# Patient Record
Sex: Male | Born: 1945 | Race: White | Hispanic: No | Marital: Married | Smoking: Former smoker
Health system: Southern US, Community
[De-identification: ages and names within clinical notes are randomized; demographics above are authoritative.]

## PROBLEM LIST (undated history)

## (undated) DIAGNOSIS — I340 Nonrheumatic mitral (valve) insufficiency: Secondary | ICD-10-CM

## (undated) DIAGNOSIS — I422 Other hypertrophic cardiomyopathy: Secondary | ICD-10-CM

## (undated) DIAGNOSIS — H269 Unspecified cataract: Secondary | ICD-10-CM

## (undated) DIAGNOSIS — N529 Male erectile dysfunction, unspecified: Secondary | ICD-10-CM

## (undated) DIAGNOSIS — I472 Ventricular tachycardia: Secondary | ICD-10-CM

## (undated) DIAGNOSIS — IMO0001 Reserved for inherently not codable concepts without codable children: Secondary | ICD-10-CM

## (undated) DIAGNOSIS — K219 Gastro-esophageal reflux disease without esophagitis: Secondary | ICD-10-CM

## (undated) DIAGNOSIS — Q219 Congenital malformation of cardiac septum, unspecified: Secondary | ICD-10-CM

## (undated) DIAGNOSIS — M8580 Other specified disorders of bone density and structure, unspecified site: Secondary | ICD-10-CM

## (undated) DIAGNOSIS — K649 Unspecified hemorrhoids: Secondary | ICD-10-CM

## (undated) DIAGNOSIS — T8859XA Other complications of anesthesia, initial encounter: Secondary | ICD-10-CM

## (undated) DIAGNOSIS — D126 Benign neoplasm of colon, unspecified: Secondary | ICD-10-CM

## (undated) DIAGNOSIS — K810 Acute cholecystitis: Secondary | ICD-10-CM

## (undated) DIAGNOSIS — K449 Diaphragmatic hernia without obstruction or gangrene: Secondary | ICD-10-CM

## (undated) DIAGNOSIS — T7840XA Allergy, unspecified, initial encounter: Secondary | ICD-10-CM

## (undated) DIAGNOSIS — R0600 Dyspnea, unspecified: Secondary | ICD-10-CM

## (undated) DIAGNOSIS — T4145XA Adverse effect of unspecified anesthetic, initial encounter: Secondary | ICD-10-CM

## (undated) HISTORY — DX: Nonrheumatic mitral (valve) insufficiency: I34.0

## (undated) HISTORY — DX: Benign neoplasm of colon, unspecified: D12.6

## (undated) HISTORY — DX: Unspecified hemorrhoids: K64.9

## (undated) HISTORY — DX: Gastro-esophageal reflux disease without esophagitis: K21.9

## (undated) HISTORY — DX: Unspecified cataract: H26.9

## (undated) HISTORY — DX: Diaphragmatic hernia without obstruction or gangrene: K44.9

## (undated) HISTORY — PX: CATARACT EXTRACTION, BILATERAL: SHX1313

## (undated) HISTORY — DX: Allergy, unspecified, initial encounter: T78.40XA

## (undated) HISTORY — DX: Congenital malformation of cardiac septum, unspecified: Q21.9

## (undated) HISTORY — DX: Acute cholecystitis: K81.0

## (undated) HISTORY — DX: Other hypertrophic cardiomyopathy: I42.2

## (undated) HISTORY — DX: Reserved for inherently not codable concepts without codable children: IMO0001

## (undated) HISTORY — DX: Paroxysmal atrial fibrillation: I48.0

## (undated) HISTORY — DX: Male erectile dysfunction, unspecified: N52.9

## (undated) HISTORY — DX: Other specified disorders of bone density and structure, unspecified site: M85.80

## (undated) HISTORY — PX: COLONOSCOPY: SHX174

---
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
## 2011-06-21 ENCOUNTER — Inpatient Hospital Stay (HOSPITAL_COMMUNITY): Payer: Medicare Other

## 2011-06-21 ENCOUNTER — Inpatient Hospital Stay (HOSPITAL_COMMUNITY)
Admission: EM | Admit: 2011-06-21 | Discharge: 2011-06-24 | DRG: 418 | Disposition: A | Discharge status: Home / Self Care | Payer: Medicare Other | Attending: Internal Medicine | Admitting: Internal Medicine

## 2011-06-21 ENCOUNTER — Emergency Department (HOSPITAL_COMMUNITY): Payer: Medicare Other

## 2011-06-21 DIAGNOSIS — K8020 Calculus of gallbladder without cholecystitis without obstruction: Secondary | ICD-10-CM

## 2011-06-21 DIAGNOSIS — R109 Unspecified abdominal pain: Secondary | ICD-10-CM

## 2011-06-21 DIAGNOSIS — I509 Heart failure, unspecified: Secondary | ICD-10-CM | POA: Diagnosis present

## 2011-06-21 DIAGNOSIS — R1013 Epigastric pain: Secondary | ICD-10-CM

## 2011-06-21 DIAGNOSIS — Z7982 Long term (current) use of aspirin: Secondary | ICD-10-CM

## 2011-06-21 DIAGNOSIS — B351 Tinea unguium: Secondary | ICD-10-CM | POA: Diagnosis present

## 2011-06-21 DIAGNOSIS — K219 Gastro-esophageal reflux disease without esophagitis: Secondary | ICD-10-CM | POA: Diagnosis present

## 2011-06-21 DIAGNOSIS — R0789 Other chest pain: Secondary | ICD-10-CM | POA: Diagnosis present

## 2011-06-21 DIAGNOSIS — K8000 Calculus of gallbladder with acute cholecystitis without obstruction: Principal | ICD-10-CM | POA: Diagnosis present

## 2011-06-21 DIAGNOSIS — K449 Diaphragmatic hernia without obstruction or gangrene: Secondary | ICD-10-CM | POA: Diagnosis present

## 2011-06-21 DIAGNOSIS — I422 Other hypertrophic cardiomyopathy: Secondary | ICD-10-CM | POA: Diagnosis present

## 2011-06-21 DIAGNOSIS — K810 Acute cholecystitis: Secondary | ICD-10-CM

## 2011-06-21 HISTORY — DX: Acute cholecystitis: K81.0

## 2011-06-21 LAB — CBC
MCV: 87.2 fL (ref 78.0–100.0)
Platelets: 179 10*3/uL (ref 150–400)
RBC: 4.53 MIL/uL (ref 4.22–5.81)
WBC: 7.8 10*3/uL (ref 4.0–10.5)

## 2011-06-21 LAB — HEPATIC FUNCTION PANEL
Bilirubin, Direct: 0.1 mg/dL (ref 0.0–0.3)
Indirect Bilirubin: 0.3 mg/dL (ref 0.3–0.9)

## 2011-06-21 LAB — POCT I-STAT, CHEM 8
BUN: 18 mg/dL (ref 6–23)
Chloride: 105 mEq/L (ref 96–112)
Sodium: 141 mEq/L (ref 135–145)

## 2011-06-21 LAB — POCT I-STAT TROPONIN I: Troponin i, poc: 0.01 ng/mL (ref 0.00–0.08)

## 2011-06-21 LAB — CARDIAC PANEL(CRET KIN+CKTOT+MB+TROPI)
CK, MB: 3.1 ng/mL (ref 0.3–4.0)
CK, MB: 2.9 ng/mL (ref 0.3–4.0)
Relative Index: 1.9 (ref 0.0–2.5)
Relative Index: 1.4 (ref 0.0–2.5)
Total CK: 143 U/L (ref 7–232)
Total CK: 152 U/L (ref 7–232)
Total CK: 167 U/L (ref 7–232)
Troponin I: <0.3 ng/mL (ref <0.30)
Troponin I: <0.3 ng/mL (ref <0.30)

## 2011-06-21 LAB — CK TOTAL AND CKMB (NOT AT ARMC)
CK, MB: 3.7 ng/mL (ref 0.3–4.0)
Relative Index: 2.5 (ref 0.0–2.5)
Total CK: 150 U/L (ref 7–232)

## 2011-06-21 LAB — DIFFERENTIAL
Basophils Relative: 0 % (ref 0–1)
Eosinophils Absolute: 0.1 10*3/uL (ref 0.0–0.7)
Lymphs Abs: 1.6 10*3/uL (ref 0.7–4.0)
Neutrophils Relative %: 69 % (ref 43–77)

## 2011-06-21 LAB — LIPASE, BLOOD: Lipase: 35 U/L (ref 11–59)

## 2011-06-22 ENCOUNTER — Inpatient Hospital Stay (HOSPITAL_COMMUNITY): Payer: BLUE CROSS/BLUE SHIELD

## 2011-06-22 ENCOUNTER — Inpatient Hospital Stay (HOSPITAL_COMMUNITY): Payer: Medicare Other

## 2011-06-22 DIAGNOSIS — R740 Nonspecific elevation of levels of transaminase and lactic acid dehydrogenase [LDH]: Secondary | ICD-10-CM

## 2011-06-22 DIAGNOSIS — R7401 Elevation of levels of liver transaminase levels: Secondary | ICD-10-CM

## 2011-06-22 DIAGNOSIS — K8020 Calculus of gallbladder without cholecystitis without obstruction: Secondary | ICD-10-CM

## 2011-06-22 DIAGNOSIS — K831 Obstruction of bile duct: Secondary | ICD-10-CM

## 2011-06-22 DIAGNOSIS — K8050 Calculus of bile duct without cholangitis or cholecystitis without obstruction: Secondary | ICD-10-CM

## 2011-06-22 LAB — COMPREHENSIVE METABOLIC PANEL
Albumin: 3 g/dL — ABNORMAL LOW (ref 3.5–5.2)
BUN: 11 mg/dL (ref 6–23)
Chloride: 104 mEq/L (ref 96–112)
Creatinine, Ser: 1.11 mg/dL (ref 0.50–1.35)
GFR calc Af Amer: 78 mL/min — ABNORMAL LOW (ref >90)
Glucose, Bld: 118 mg/dL — ABNORMAL HIGH (ref 70–99)
Total Bilirubin: 5.2 mg/dL — ABNORMAL HIGH (ref 0.3–1.2)
Total Protein: 5.8 g/dL — ABNORMAL LOW (ref 6.0–8.3)

## 2011-06-22 LAB — CBC
Hemoglobin: 13.3 g/dL (ref 13.0–17.0)
MCH: 30.2 pg (ref 26.0–34.0)
Platelets: 148 10*3/uL — ABNORMAL LOW (ref 150–400)
RBC: 4.4 MIL/uL (ref 4.22–5.81)
WBC: 5 10*3/uL (ref 4.0–10.5)

## 2011-06-22 LAB — MRSA PCR SCREENING: MRSA by PCR: NEGATIVE

## 2011-06-22 LAB — DIFFERENTIAL
Basophils Relative: 0 % (ref 0–1)
Monocytes Relative: 9 % (ref 3–12)
Neutro Abs: 3.4 10*3/uL (ref 1.7–7.7)
Neutrophils Relative %: 68 % (ref 43–77)

## 2011-06-23 ENCOUNTER — Other Ambulatory Visit (INDEPENDENT_AMBULATORY_CARE_PROVIDER_SITE_OTHER): Payer: Self-pay | Admitting: Surgery

## 2011-06-23 ENCOUNTER — Encounter (INDEPENDENT_AMBULATORY_CARE_PROVIDER_SITE_OTHER): Payer: Self-pay | Admitting: Surgery

## 2011-06-23 ENCOUNTER — Inpatient Hospital Stay (HOSPITAL_COMMUNITY): Payer: Medicare Other

## 2011-06-23 DIAGNOSIS — K8010 Calculus of gallbladder with chronic cholecystitis without obstruction: Secondary | ICD-10-CM

## 2011-06-23 LAB — SURGICAL PCR SCREEN
MRSA, PCR: NEGATIVE
Staphylococcus aureus: NEGATIVE

## 2011-06-23 LAB — CBC
HCT: 36.6 % — ABNORMAL LOW (ref 39.0–52.0)
Hemoglobin: 12.2 g/dL — ABNORMAL LOW (ref 13.0–17.0)
RBC: 4.05 MIL/uL — ABNORMAL LOW (ref 4.22–5.81)
WBC: 4.9 10*3/uL (ref 4.0–10.5)

## 2011-06-23 LAB — COMPREHENSIVE METABOLIC PANEL
BUN: 7 mg/dL (ref 6–23)
CO2: 25 mEq/L (ref 19–32)
Calcium: 8.1 mg/dL — ABNORMAL LOW (ref 8.4–10.5)
Chloride: 109 mEq/L (ref 96–112)
Creatinine, Ser: 1.11 mg/dL (ref 0.50–1.35)
GFR calc Af Amer: 78 mL/min — ABNORMAL LOW (ref >90)
GFR calc non Af Amer: 67 mL/min — ABNORMAL LOW (ref >90)
Total Bilirubin: 4.3 mg/dL — ABNORMAL HIGH (ref 0.3–1.2)

## 2011-06-23 LAB — BILIRUBIN, DIRECT: Bilirubin, Direct: 3.6 mg/dL — ABNORMAL HIGH (ref 0.0–0.3)

## 2011-06-24 HISTORY — PX: CHOLECYSTECTOMY: SHX55

## 2011-06-24 LAB — CBC
Platelets: 155 10*3/uL (ref 150–400)
RDW: 13.4 % (ref 11.5–15.5)
WBC: 9 10*3/uL (ref 4.0–10.5)

## 2011-06-24 LAB — COMPREHENSIVE METABOLIC PANEL
ALT: 310 U/L — ABNORMAL HIGH (ref 0–53)
AST: 179 U/L — ABNORMAL HIGH (ref 0–37)
Albumin: 2.8 g/dL — ABNORMAL LOW (ref 3.5–5.2)
Alkaline Phosphatase: 109 U/L (ref 39–117)
Chloride: 105 mEq/L (ref 96–112)
Creatinine, Ser: 1.16 mg/dL (ref 0.50–1.35)
Potassium: 3.8 mEq/L (ref 3.5–5.1)
Sodium: 139 mEq/L (ref 135–145)
Total Bilirubin: 2 mg/dL — ABNORMAL HIGH (ref 0.3–1.2)

## 2011-06-24 NOTE — Op Note (Signed)
Joseph Morrow, Joseph Morrow NO.:  [id number]  MEDICAL RECORD NO.:  [id number]  LOCATION:  3735                         FACILITY:  [facility]  PHYSICIAN:  Currie Paris, M.D.DATE OF BIRTH:  1946-08-03  DATE OF PROCEDURE:  06/23/2011 DATE OF DISCHARGE:                              OPERATIVE REPORT   PREOPERATIVE DIAGNOSIS:  Subacute calculus cholecystitis, status post endoscopic retrograde cholangiopancreatography for choledocholithiasis.  POSTOPERATIVE DIAGNOSIS:  Subacute calculus cholecystitis, status post endoscopic retrograde cholangiopancreatography for choledocholithiasis.  PROCEDURE:  Laparoscopic cholecystectomy with operative cholangiogram.  SURGEON:  Currie Paris, MD.  ASSISTANT:  Brayton El, PA-C.  ANESTHESIA:  General endotracheal.  CLINICAL HISTORY:  This is a 65 year old gentleman who recently presented with abdominal pain, was found to have cholelithiasis.  The morning after admission, his bilirubin jumped to over 5 and GI consultation was obtained.  ERCP was done that day with stone retrieval.  The next morning, he was feeling better.  His bilirubin had started to drift down.  At that point, it was recommended that we proceed as planned with a cholecystectomy.  DESCRIPTION OF PROCEDURE:  I saw the patient again in the holding area and he had no further questions.  He was taken to the operating room and after satisfactory general endotracheal anesthesia had been obtained, the abdomen was clipped, prepped, and draped.  0.25% plain Marcaine was used for each incision.  The umbilical incision was made, the fascia identified and opened and the peritoneal cavity entered under direct vision.  A pursestring was placed, the Hasson introduced, and the abdomen insufflated to 15.  Exam of the abdomen showed a tense distended gallbladder but no other obvious abnormalities.  There were a few right lower quadrant adhesions secondary to an old  appendectomy type scar.  The patient was placed in reverse Trendelenburg and tilted to the left. A 10-11 trocar was placed in the epigastrium and two 5s laterally.  The fundus of the gallbladder was retracted inferolaterally and the peritoneum opened over the cystic duct and cystic artery, and I dissected out both of those with a nice window behind both and between the 2.  The cystic duct was about the diameter of one of the clips but just a little bit smaller than that and the cystic artery appeared normal.  Once I had the anatomy confirmed, I put a clip on the cystic artery and one on the cystic duct.  The cystic duct was then opened.  A Cook catheter was placed percutaneously and threaded into the cystic duct and held with a clip.  Operative angiography was done which showed filling of the common hepatic duct, common duct, and into the duodenum. Unfortunately, the static image was remaining and we did not get the full fluoro images, but we had good films and the patient had a prior ERCP, so I elected not to repeat the films.  The cystic duct catheter was removed and 3 clips placed across the cystic duct and the duct was divided.  Two additional clips were placed on the cystic artery and it was divided.  There was no posterior artery just a one main artery although there was some tissue just posterior to the first artery that  I clipped.  The gallbladder was then removed from below to above with coagulation current of cautery.  Once it was disconnected, it was placed in a bag and I inspected to make sure there is no bleeding along the bed of the gallbladder or around the area of the cystic artery and cystic duct. Everything appeared to be dry.  The gallbladder was brought out through the umbilical port site.  That was occluded for a moment while we did a final irrigation and check for hemostasis.  Everything appeared to be dry.  The lateral ports removed under direct vision and  there is no bleeding.  The umbilical site was closed with a pursestring.  The abdomen was deflated through the epigastric port which was removed.  Skin was closed with 4-0 Monocryl subcuticular and Dermabond.  The patient tolerated the procedure well.  There were no operative complications.  All counts were correct.     Currie Paris, M.D.     CJS/MEDQ  D:  06/23/2011  T:  06/23/2011  Job:  [id number]  cc:   Venita Lick. Russella Dar, MD, [facility]  Electronically Signed by Cyndia Bent M.D. on 06/24/2011 07:17:40 PM

## 2011-06-30 NOTE — Consult Note (Signed)
NAMEEASTEN, MACEACHERN NO.:  [id number]  MEDICAL RECORD NO.:  [id number]  LOCATION:  3735                         FACILITY:  [facility]  PHYSICIAN:  Cherylynn Ridges, M.D.    DATE OF BIRTH:  07-10-1946  DATE OF CONSULTATION:  06/21/2011 DATE OF DISCHARGE:                                CONSULTATION   Dear Dr. Eloise Harman:  Thank you very much for asking me to see Joseph Morrow, a very pleasant 65 year old gentleman with severe epigastric and lower chest pain postprandially after eating chili, lasted for several hours, was not relieved by anything that he could do at home.  That included taking Tums and antacids.  He came to the emergency room, was followed up, and found to have normal cardiac enzymes.  It was felt so this possibly could be a gallbladder problem.  Therefore, an ultrasound was done, which showed gallstones with sludge, but no evidence of acute cholecystitis.  Since his admission, his pain has waxed and waned from 1/10 to 10/10.  I just saw the patient is currently having no pain, but he had just gotten some pain medicines and antinausea medication.  He likely has acute cholecystitis currently with at least biliary colic.  PAST MEDICAL HISTORY:  Significant for a hypertrophic cardiomyopathy with minimal symptoms is detected on ultrasound done by his cardiologist, who is with the [facility].  PAST SURGICAL HISTORY:  He has had a remote appendectomy.  ALLERGIES:  He has no known drug allergies.  MEDICATIONS:  Currently include: 1. Atenolol 25 mg p.o. daily. 2. Folic acid 1 tablet a day, 1 mg a day. 3. Terbinafine 250 mg daily. 4. Coenzyme Q. 5. Zinc. 6. Baby aspirin a day. 7. Vitamin D. 8. Multivitamin.  Again, he has no allergies.  REVIEW OF SYSTEMS:  He has somewhat chronic diarrhea.  He has nausea, vomiting associated with severe pain.  PHYSICAL EXAMINATION:  VITAL SIGNS:  He currently has a temperature up to 99.6, he was afebrile  previously. GENERAL:  He is in no acute distress currently. HEENT:  He is normocephalic, atraumatic, and anicteric. NECK:  Supple. CHEST:  Clear to auscultation. CARDIAC:  He has got a grade 2-3/6 murmur at the left lower sternal border.  The patient said it increases with squatting, likely increased preload, increased the flow through his hypertrophic area of his heart. ABDOMEN:  Soft, but he is tender with slight palpable fullness in the right upper quadrant and possibly a palpable mass.  This could represent an inflamed gallbladder.  It is tender in that area and although he had a negative sonographic Murphy sign, he has a definite Murphy sign on current examination. RECTAL:  Not performed.  LABORATORY STUDIES:  He has normal white count.  Hemoglobin is normal. Hematocrit is normal.  All his liver function tests normal.  Lipase is normal.  IMPRESSION:  Based on examination, the patient has what appears to be acute or subacute cholecystitis, may be early acute cholecystitis with cholelithiasis.  I believe he does need to have his gallbladder removed in order to alleviate his symptoms; however, it does not have to be done urgently.  I will start him on antibiotics since he currently does have  a low-grade fever and he has what appears to be palpable inflammation in his right upper quadrant.  I will start him on Zosyn 3.375 g IV piggyback q.6 h and then we will go ahead and likely take out his gallbladder tomorrow, although he is scheduled for a HIDA scan.  We may go ahead and not do the HIDA scan based on his clinical examination, however, if it is done early, we will have that additional information. Otherwise, we will make him n.p.o. after midnight, increase his fluids up to 100 mL an hour.     Cherylynn Ridges, M.D.     JOW/MEDQ  D:  06/21/2011  T:  06/22/2011  Job:  [id number]  cc:   Barry Dienes. Eloise Harman, M.D.  Electronically Signed by Jimmye Norman M.D. on 06/30/2011 07:30:15  AM

## 2011-07-07 ENCOUNTER — Ambulatory Visit (INDEPENDENT_AMBULATORY_CARE_PROVIDER_SITE_OTHER): Payer: Medicare Other | Admitting: Radiology

## 2011-07-07 ENCOUNTER — Encounter (INDEPENDENT_AMBULATORY_CARE_PROVIDER_SITE_OTHER): Payer: Self-pay

## 2011-07-07 VITALS — BP 122/70 | HR 66 | Temp 96.9°F | Resp 16 | Ht 70.0 in | Wt 186.0 lb

## 2011-07-07 DIAGNOSIS — K810 Acute cholecystitis: Secondary | ICD-10-CM

## 2011-07-07 NOTE — H&P (Signed)
Joseph Morrow, Joseph Morrow NO.:  [id number]  MEDICAL RECORD NO.:  [id number]  LOCATION:  3735                         FACILITY:  [facility]  PHYSICIAN:  Barry Dienes. Eloise Harman, M.D.DATE OF BIRTH:  [birth year]  DATE OF ADMISSION:  06/21/2011 DATE OF DISCHARGE:                             HISTORY & PHYSICAL   CHIEF COMPLAINT:  Chest pain.  HISTORY OF PRESENT ILLNESS:  The patient is a 65 year old Caucasian man who last evening at about 9 p.m. developed low substernal chest pain with nausea and vomiting while watching TV after having some chilly.  He tried Tums and this was ineffective.  The pain initially was quite severe at 10/10 intensity.  With the pain,  he has had some belching. He did not have shortness of breath or diaphoresis with the pain.  He presented to the emergency room for evaluation.  The pain is now described as 2/10 intensity and more in the epigastrium and right upper quadrant.  Workup by the emergency room showed a chest x-ray with mild cardiomegaly and prominent pulmonary vasculature, so he has been treated with Lasix 40 mg IV.  He has also had testing including an abdomen ultrasound exam that showed gallstones with sludge, but no sonographic evidence of acute gallbladder disease.  Of note, he has a history of hypertrophic cardiomyopathy and is followed by his cardiologist with [facility].  Initially, they were called for admission, but declined.  PAST MEDICAL HISTORY: 1. Hypertrophic cardiomyopathy. 2. Gastroesophageal reflux disease. 3. Hiatal hernia.  MEDICATIONS PRIOR TO HOSPITALIZATION: 1. Atenolol 25 mg p.o. daily. 2. Folate 1 tablet p.o. daily. 3. Terbinafine 250 mg 1 tablet p.o. daily. 4. Coenzyme Q10 one tablet p.o. daily. 5. Zinc 1 tablet p.o. daily. 6. Aspirin 81 mg p.o. daily. 7. Vitamin D 400 units p.o. daily. 8. Multivitamin 1 tablet daily. 9. Atenolol 25 mg p.o. daily.  ALLERGIES:  No known drug allergies  PAST SURGICAL  HISTORY:  Remote appendectomy, 2006 colonoscopy that showed an adenoma polyp and this was done in [place].  FAMILY HISTORY:  His father died in his 78s from pneumonia.  His mother died in her 90s from congestive heart failure.  A brother died at age 51 years from myocardial infarction.  Another brother is aged 37 years and also has a hypertrophic cardiomyopathy.  SOCIAL HISTORY:  He is married and has one son and one daughter.  He has a remote history of tobacco use.  He has no history of alcohol abuse.  REVIEW OF SYSTEMS:  He denies fever, chills, shortness of breath, or cough.  He does have a history of gastroesophageal reflux disease and hiatal hernia.  He denies a history of peptic ulcer disease or problems with his prostate gland or arthralgias or anxiety or depression.  INITIAL PHYSICAL EXAMINATION:  VITAL SIGNS:  Blood pressure 100/51, pulse 60, respirations 18, temperature 98.5, and pulse oxygen saturation 99% on nasal cannula oxygen at 2 liters per minute. GENERAL:  He is a well-nourished, well-developed white man who is somewhat uncomfortable, lying in the emergency room gurney in reverse Trendelenburg position.  He was not uncomfortable because of breathing just because of his pain. HEAD, EYES, EARS, NOSE, AND THROAT:  Within normal  limits and he did not have scleral icterus. NECK:  Supple without jugular venous distention or carotid bruit. CHEST:  Clear to auscultation. HEART:  Regular rate and rhythm.  S1 and S2 were present and there was a systolic ejection murmur of grade 2/6 at the left sternal border. ABDOMEN:  Normal bowel sounds and mild right upper quadrant tenderness without rebound tenderness. EXTREMITIES:  Without cyanosis, clubbing, or edema and the pedal pulses were normal.  INITIAL LABORATORY STUDIES:  White blood cell count 7.8, hemoglobin 14, hematocrit 42, and platelets 179.  Serum sodium 141, potassium 3.6, chloride 105, BUN 18, creatinine 1.40,  glucose 133, total protein 6.8, albumin 4.0, alkaline phosphatase 58, lipase 35.  Troponin I was 0.01, total CPK was 150.  Chest x-ray showed cardiomegaly with possible early pulmonary edema.  Abdomen ultrasound exam showed gallstones and sludge with no gallbladder wall thickening and no sonographic Murphy sign.  EKG showed the following: 1. Normal sinus rhythm. 2. Abnormal R-wave progression with no R-waves in leads V1 and V2     suggestive of an anteroseptal MI.  IMPRESSION AND PLAN: 1. Chest pain:  Currently, the patient is troubled more by an     epigastric and right upper quadrant pain and shows right upper     quadrant tenderness on exam.  It is most likely that he has acute     cholecystitis despite initial findings on the abdomen ultrasound     exam.  Ultrasound findings can leg behind clinical findings in very     acute gallbladder disease.  His symptoms would be less likely due     to coronary artery disease with non-Q-wave myocardial infarction.     His symptoms could be due to gastroesophageal reflux disease or     peptic ulcer disease.  We will check serial cardiac isoenzymes.  We     will recheck liver-associated enzymes tomorrow morning.  He will     eventually need cardiac risk stratification if his enzymes are     negative. 2. Gastroesophageal reflux disease:  Clinically stable and we will     treat him with Protonix 40 mg daily. 3. Congestive heart failure:  His chest x-ray was somewhat suggestive     of CHF, although his clinical scenario was not and breathing wife     he is comfortable lying in a slightly reverse Trendelenburg     position.  We will check a BNP test.  We will also obtain an     echocardiogram as an inpatient.          ______________________________ Barry Dienes. Eloise Harman, M.D.    DGP/MEDQ  D:  06/21/2011  T:  06/21/2011  Job:  [id number]  Electronically Signed by Jarome Matin M.D. on 07/07/2011 08:42:28 AM

## 2011-07-07 NOTE — Progress Notes (Signed)
Joseph Morrow 01/19/1946 [id number] 07/07/2011   Joseph Morrow is a 65 y.o. male who had a laparoscopic cholecystectomy with intraoperative cholangiogram.  The pathology report confirmed cholecystitis.  The patient reports that they are feeling well with normal bowel movements and good appetite.  The pre-operative symptoms of abdominal pain, nausea, and vomiting have resolved.    Physical examination - Incisions appear well-healed with no sign of infection or bleeding.   Abdomen - soft, non-tender  Impression:  s/p laparoscopic cholecystectomy  Plan:  He may resume a regular diet and full activity.  He may follow-up on a PRN basis.

## 2011-07-13 NOTE — Discharge Summary (Signed)
NAMEMarland Kitchen  VILAS, EDGERLY NO.:  [id number]  MEDICAL RECORD NO.:  [id number]  LOCATION:  3735                         FACILITY:  [facility]  PHYSICIAN:  Kari Baars, M.D.  DATE OF BIRTH:  1946-04-16  DATE OF ADMISSION:  06/21/2011 DATE OF DISCHARGE:  06/24/2011                              DISCHARGE SUMMARY   DISCHARGE DIAGNOSES: 1. Acute cholecystitis, status post laparoscopic cholecystectomy     (June 23, 2011). 2. Choledocholithiasis, status post endoscopic retrograde     cholangiopancreatography  with stone extractions/sphincterotomy     (June 22, 2011). 3. Elevated liver function tests with hyperbilirubinemia and     transaminitis secondary to above. 4. Atypical chest pain secondary to above. 5. 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 6. Onychomycosis. 7. Gastroesophageal reflux disease. 8. Hiatal hernia.  DISCHARGE MEDICATIONS: 1. Percocet 5/325 one q.4 h. p.r.n. severe pain. 2. Aspirin 81 mg daily. 3. Atenolol 25 mg daily. 4. Coenzyme Q-10 one tablet daily. 5. Folic acid 1 tablet daily. 6. Multivitamin daily. 7. Vitamin D 400 units daily. 8. Zinc 220 mcg daily. 9. He was instructed to stop taking terbinafine due to elevated liver     function tests.  HOSPITAL PROCEDURES: 1. Abdominal ultrasound (June 21, 2011), cholelithiasis without     definite sonographic evidence for cholecystitis.  Mildly     heterogeneous liver echogenicity is nonspecific. 2. Repeat ultrasound (June 22, 2011), cholelithiasis.  Common bile     duct now measures 9 mm with mild gallbladder wall thickening.     Negative sonographic Murphy sign. 3. ERCP with sphincterotomy (June 22, 2011), stone and sludge     successfully extracted. 4. Laparoscopic cholecystectomy with intraoperative cholangiogram     (June 23, 2011), by Dr. Cicero Duck. 5. Transthoracic echocardiogram (June 23, 2011), ejection  fraction     65-70%.  Moderate focal basal hypertrophy of the septum.  Doppler     parameters consistent with high ventricular filling pressure.     Mildly dilated right ventricle with mildly increased PA pressure     (46 mmHg).  HISTORY OF PRESENT ILLNESS:  For full details, please see dictated history and physical by Dr. Jarold Motto.  Briefly, Mr. Ferrara is a 65- year-old white male with a history of hypertrophic cardiomyopathy, gastroesophageal reflux disease, who presented to the emergency department with severe epigastric and low sternal, abdominal/chest pain. He described the pain as a severe 10/10 pain that began about 1 hour after eating some chillies.  He had no associated shortness of breath, diaphoresis, but did feel nauseated.  The pain persisted prompting him to come to the emergency department where an abdominal ultrasound showed cholelithiasis with sludge, but no evidence of acute cholecystitis. Cardiac enzymes were negative and EKG showed normal sinus rhythm with poor R-wave progression.  Given his ongoing pain, he was admitted for further management.  HOSPITAL COURSE:  The patient was admitted to a telemetry bed.  He had cardiac enzymes that were negative for myocardial infarction.  He continued to have fairly constant epigastric pain that was clearly worse after eating.  General Surgery was consulted and recommended a HIDA scan prior to anticipated cholecystectomy.  However, on  the following morning, the patient's total bilirubin was up to 5.2 with an AST of 471 and ALT of 425.  Of note, his liver function tests were normal on admission.  Therefore, his HIDA scan was canceled and then a repeat ultrasound was performed that showed common bile duct dilatation. Gastroenterology was consulted and performed an ERCP with common bile duct stone extraction and sludge removal as well as a sphincterotomy. With this, his pain was significantly improved and his liver tests  were trending down on the following morning.  General Surgery elected to proceed with a laparoscopic cholecystectomy with intraoperative cholangiogram, which was performed on June 23, 2011.  The patient tolerated this procedure well and is now having minimal abdominal pain and no significant postoperative complications.  His followup liver function tests are pending at this time, but will be monitored as an outpatient.  The patient has been on Zosyn throughout his hospital course for treatment of cholecystitis.  Given the lack of fever and definitive surgical treatment, antibiotics can be discontinued.  He did have a preoperative echocardiogram performed that showed an ejection fraction of 65-70% with findings consistent with prior known hypertrophic cardiomyopathy.  He has not had any symptoms of decompensated heart failure and his blood pressure and pulse rate have been excellent on his home beta-blocker.  DISCHARGE LABS:  CBC shows a white count of 9.0, hemoglobin 12.6, platelets 155.  BMET on June 23, 2011, showed a sodium 139, potassium 4.0, chloride 109, bicarb 25, BUN 7, creatinine 1.11, glucose 118, total bilirubin 4.3, alk phos 102, AST 203, ALT 299, albumin 2.6.  Cardiac enzymes are negative x3.  ProBNP mildly elevated at 661 on June 22, 2011.  DISCHARGE DIET:  Soft mechanical diet.  Advance as tolerated to cardiac prudent.  DISCHARGE INSTRUCTIONS:  He was instructed to call if he has increasing abdominal pain, fever above 101.5, increased redness or drainage from his incision sites, or nausea or vomiting.  HOSPITAL FOLLOWUP:  He will follow up with Dr. Clelia Croft at [facility] within the next week for repeat liver function tests.  He is to follow up with General Surgery as directed after their evaluation this morning.  CONDITION ON DISCHARGE:  Improved.  DISPOSITION:  To home.  Time with discharge activities, 25 minutes.     Kari Baars,  M.D.     WS/MEDQ  D:  06/24/2011  T:  06/24/2011  Job:  [id number]  cc:   Currie Paris, M.D. Venita Lick. Russella Dar, MD, [facility]  Electronically Signed by Lacretia Nicks. Buren Kos M.D. on 07/13/2011 10:05:53 PM

## 2011-11-04 DIAGNOSIS — Z9089 Acquired absence of other organs: Secondary | ICD-10-CM | POA: Diagnosis not present

## 2011-11-04 DIAGNOSIS — R1013 Epigastric pain: Secondary | ICD-10-CM | POA: Diagnosis not present

## 2012-03-15 DIAGNOSIS — R0789 Other chest pain: Secondary | ICD-10-CM | POA: Diagnosis not present

## 2012-03-15 DIAGNOSIS — R1013 Epigastric pain: Secondary | ICD-10-CM | POA: Diagnosis not present

## 2012-03-15 DIAGNOSIS — I421 Obstructive hypertrophic cardiomyopathy: Secondary | ICD-10-CM | POA: Diagnosis not present

## 2012-03-15 DIAGNOSIS — R079 Chest pain, unspecified: Secondary | ICD-10-CM | POA: Diagnosis not present

## 2012-03-16 DIAGNOSIS — R079 Chest pain, unspecified: Secondary | ICD-10-CM | POA: Diagnosis not present

## 2012-03-16 DIAGNOSIS — I422 Other hypertrophic cardiomyopathy: Secondary | ICD-10-CM | POA: Diagnosis not present

## 2012-04-08 DIAGNOSIS — L989 Disorder of the skin and subcutaneous tissue, unspecified: Secondary | ICD-10-CM | POA: Diagnosis not present

## 2012-04-08 DIAGNOSIS — M3500 Sicca syndrome, unspecified: Secondary | ICD-10-CM | POA: Diagnosis not present

## 2012-05-12 DIAGNOSIS — Z79899 Other long term (current) drug therapy: Secondary | ICD-10-CM | POA: Diagnosis not present

## 2012-05-12 DIAGNOSIS — M3500 Sicca syndrome, unspecified: Secondary | ICD-10-CM | POA: Diagnosis not present

## 2012-05-12 DIAGNOSIS — Z125 Encounter for screening for malignant neoplasm of prostate: Secondary | ICD-10-CM | POA: Diagnosis not present

## 2012-05-19 DIAGNOSIS — R7301 Impaired fasting glucose: Secondary | ICD-10-CM | POA: Diagnosis not present

## 2012-05-19 DIAGNOSIS — Z0000 Encounter for general adult medical examination without abnormal findings: Secondary | ICD-10-CM | POA: Diagnosis not present

## 2012-05-19 DIAGNOSIS — M3500 Sicca syndrome, unspecified: Secondary | ICD-10-CM | POA: Diagnosis not present

## 2012-05-19 DIAGNOSIS — B356 Tinea cruris: Secondary | ICD-10-CM | POA: Diagnosis not present

## 2012-05-19 DIAGNOSIS — I421 Obstructive hypertrophic cardiomyopathy: Secondary | ICD-10-CM | POA: Diagnosis not present

## 2012-05-19 DIAGNOSIS — Z125 Encounter for screening for malignant neoplasm of prostate: Secondary | ICD-10-CM | POA: Diagnosis not present

## 2012-05-19 DIAGNOSIS — Z23 Encounter for immunization: Secondary | ICD-10-CM | POA: Diagnosis not present

## 2012-05-23 DIAGNOSIS — Z1212 Encounter for screening for malignant neoplasm of rectum: Secondary | ICD-10-CM | POA: Diagnosis not present

## 2012-06-29 DIAGNOSIS — R079 Chest pain, unspecified: Secondary | ICD-10-CM | POA: Diagnosis not present

## 2012-06-29 DIAGNOSIS — I422 Other hypertrophic cardiomyopathy: Secondary | ICD-10-CM | POA: Diagnosis not present

## 2012-06-29 DIAGNOSIS — K219 Gastro-esophageal reflux disease without esophagitis: Secondary | ICD-10-CM | POA: Diagnosis not present

## 2012-07-08 DIAGNOSIS — R1013 Epigastric pain: Secondary | ICD-10-CM | POA: Diagnosis not present

## 2012-07-13 DIAGNOSIS — R1013 Epigastric pain: Secondary | ICD-10-CM | POA: Diagnosis not present

## 2012-07-13 DIAGNOSIS — I421 Obstructive hypertrophic cardiomyopathy: Secondary | ICD-10-CM | POA: Diagnosis not present

## 2012-08-03 DIAGNOSIS — Z961 Presence of intraocular lens: Secondary | ICD-10-CM | POA: Diagnosis not present

## 2012-08-03 DIAGNOSIS — H0289 Other specified disorders of eyelid: Secondary | ICD-10-CM | POA: Diagnosis not present

## 2012-08-30 DIAGNOSIS — R109 Unspecified abdominal pain: Secondary | ICD-10-CM | POA: Diagnosis not present

## 2012-08-30 DIAGNOSIS — R1013 Epigastric pain: Secondary | ICD-10-CM | POA: Diagnosis not present

## 2012-08-30 DIAGNOSIS — K219 Gastro-esophageal reflux disease without esophagitis: Secondary | ICD-10-CM | POA: Diagnosis not present

## 2012-08-30 DIAGNOSIS — K921 Melena: Secondary | ICD-10-CM | POA: Diagnosis not present

## 2012-09-29 DIAGNOSIS — L821 Other seborrheic keratosis: Secondary | ICD-10-CM | POA: Diagnosis not present

## 2012-09-29 DIAGNOSIS — D1801 Hemangioma of skin and subcutaneous tissue: Secondary | ICD-10-CM | POA: Diagnosis not present

## 2012-09-29 DIAGNOSIS — L570 Actinic keratosis: Secondary | ICD-10-CM | POA: Diagnosis not present

## 2012-09-29 DIAGNOSIS — L819 Disorder of pigmentation, unspecified: Secondary | ICD-10-CM | POA: Diagnosis not present

## 2012-12-21 DIAGNOSIS — IMO0002 Reserved for concepts with insufficient information to code with codable children: Secondary | ICD-10-CM | POA: Diagnosis not present

## 2012-12-21 DIAGNOSIS — M79609 Pain in unspecified limb: Secondary | ICD-10-CM | POA: Diagnosis not present

## 2013-01-02 DIAGNOSIS — R059 Cough, unspecified: Secondary | ICD-10-CM | POA: Diagnosis not present

## 2013-01-02 DIAGNOSIS — R498 Other voice and resonance disorders: Secondary | ICD-10-CM | POA: Diagnosis not present

## 2013-01-02 DIAGNOSIS — R05 Cough: Secondary | ICD-10-CM | POA: Diagnosis not present

## 2013-01-02 DIAGNOSIS — Z6825 Body mass index (BMI) 25.0-25.9, adult: Secondary | ICD-10-CM | POA: Diagnosis not present

## 2013-01-02 DIAGNOSIS — R0982 Postnasal drip: Secondary | ICD-10-CM | POA: Diagnosis not present

## 2013-01-20 DIAGNOSIS — R0982 Postnasal drip: Secondary | ICD-10-CM | POA: Diagnosis not present

## 2013-01-20 DIAGNOSIS — R05 Cough: Secondary | ICD-10-CM | POA: Diagnosis not present

## 2013-01-20 DIAGNOSIS — IMO0002 Reserved for concepts with insufficient information to code with codable children: Secondary | ICD-10-CM | POA: Diagnosis not present

## 2013-01-20 DIAGNOSIS — R059 Cough, unspecified: Secondary | ICD-10-CM | POA: Diagnosis not present

## 2013-02-23 DIAGNOSIS — K219 Gastro-esophageal reflux disease without esophagitis: Secondary | ICD-10-CM | POA: Diagnosis not present

## 2013-02-23 DIAGNOSIS — R1013 Epigastric pain: Secondary | ICD-10-CM | POA: Diagnosis not present

## 2013-02-23 DIAGNOSIS — R109 Unspecified abdominal pain: Secondary | ICD-10-CM | POA: Diagnosis not present

## 2013-02-23 DIAGNOSIS — I421 Obstructive hypertrophic cardiomyopathy: Secondary | ICD-10-CM | POA: Diagnosis not present

## 2013-03-09 DIAGNOSIS — M25529 Pain in unspecified elbow: Secondary | ICD-10-CM | POA: Diagnosis not present

## 2013-03-23 DIAGNOSIS — M25529 Pain in unspecified elbow: Secondary | ICD-10-CM | POA: Diagnosis not present

## 2013-05-18 DIAGNOSIS — Z0000 Encounter for general adult medical examination without abnormal findings: Secondary | ICD-10-CM | POA: Diagnosis not present

## 2013-05-18 DIAGNOSIS — Z125 Encounter for screening for malignant neoplasm of prostate: Secondary | ICD-10-CM | POA: Diagnosis not present

## 2013-05-18 DIAGNOSIS — R7301 Impaired fasting glucose: Secondary | ICD-10-CM | POA: Diagnosis not present

## 2013-05-24 DIAGNOSIS — Z1331 Encounter for screening for depression: Secondary | ICD-10-CM | POA: Diagnosis not present

## 2013-05-24 DIAGNOSIS — Z23 Encounter for immunization: Secondary | ICD-10-CM | POA: Diagnosis not present

## 2013-05-24 DIAGNOSIS — M25569 Pain in unspecified knee: Secondary | ICD-10-CM | POA: Diagnosis not present

## 2013-05-24 DIAGNOSIS — R7301 Impaired fasting glucose: Secondary | ICD-10-CM | POA: Diagnosis not present

## 2013-05-24 DIAGNOSIS — K219 Gastro-esophageal reflux disease without esophagitis: Secondary | ICD-10-CM | POA: Diagnosis not present

## 2013-05-24 DIAGNOSIS — I421 Obstructive hypertrophic cardiomyopathy: Secondary | ICD-10-CM | POA: Diagnosis not present

## 2013-05-24 DIAGNOSIS — M66329 Spontaneous rupture of flexor tendons, unspecified upper arm: Secondary | ICD-10-CM | POA: Diagnosis not present

## 2013-05-24 DIAGNOSIS — Z0000 Encounter for general adult medical examination without abnormal findings: Secondary | ICD-10-CM | POA: Diagnosis not present

## 2013-05-24 DIAGNOSIS — R7401 Elevation of levels of liver transaminase levels: Secondary | ICD-10-CM | POA: Diagnosis not present

## 2013-05-25 DIAGNOSIS — Z1212 Encounter for screening for malignant neoplasm of rectum: Secondary | ICD-10-CM | POA: Diagnosis not present

## 2013-06-23 ENCOUNTER — Encounter: Payer: Self-pay | Admitting: Cardiology

## 2013-06-28 ENCOUNTER — Ambulatory Visit (INDEPENDENT_AMBULATORY_CARE_PROVIDER_SITE_OTHER): Payer: Medicare Other | Admitting: Cardiology

## 2013-06-28 ENCOUNTER — Encounter: Payer: Self-pay | Admitting: Cardiology

## 2013-06-28 VITALS — BP 122/64 | HR 59 | Ht 70.0 in | Wt 172.0 lb

## 2013-06-28 DIAGNOSIS — R0609 Other forms of dyspnea: Secondary | ICD-10-CM

## 2013-06-28 DIAGNOSIS — I422 Other hypertrophic cardiomyopathy: Secondary | ICD-10-CM | POA: Insufficient documentation

## 2013-06-28 DIAGNOSIS — I059 Rheumatic mitral valve disease, unspecified: Secondary | ICD-10-CM | POA: Diagnosis not present

## 2013-06-28 DIAGNOSIS — R0600 Dyspnea, unspecified: Secondary | ICD-10-CM

## 2013-06-28 DIAGNOSIS — R011 Cardiac murmur, unspecified: Secondary | ICD-10-CM | POA: Insufficient documentation

## 2013-06-28 DIAGNOSIS — R42 Dizziness and giddiness: Secondary | ICD-10-CM | POA: Diagnosis not present

## 2013-06-28 DIAGNOSIS — K219 Gastro-esophageal reflux disease without esophagitis: Secondary | ICD-10-CM

## 2013-06-28 DIAGNOSIS — I340 Nonrheumatic mitral (valve) insufficiency: Secondary | ICD-10-CM

## 2013-06-28 NOTE — Progress Notes (Signed)
[address] [place], [place]  [address] Phone: [phone] Fax:  [phone]  Date:  06/28/2013   ID:  Joseph Morrow, DOB 1946-08-29, MRN [id number]  PCP:  Kari Baars, MD   History of Present Illness: Joseph Morrow is a 67 y.o. male with history of hypertrophic cardiomyopathy, brother also has, with normal ejection fraction, CT scan demonstrating normal coronary arteries in 2009 after stress test showed anterior wall ischemia.  Previously he described chest tightness felt to be GI associated with bloating. Nonexertional, across entire chest and occurs usually with drinking soda and beer. Belching usually resolve the symptoms. If moves quickly can get dizzy. Working, deliveries.   At night, wine and popcorn - has cramps. ?Dehydration.  Exercises without difficulty. Occasional palpitations at night related to position. No high risk symptoms such as syncope.  Previous echocardiogram from 2012 demonstrated left ventricular septal hypertrophy of 18 mm with normal ejection fraction and minimal LVOT gradient.   Wt Readings from Last 3 Encounters:  06/28/13 172 lb (78.019 kg)  07/07/11 186 lb (84.369 kg)     Past Medical History  Diagnosis Date  . Acute cholecystitis 06/21/2011  . GERD (gastroesophageal reflux disease)   . Heart murmur   . Hearing loss   . Bruises easily   . Hypertrophic cardiomyopathy   . Mitral regurgitation     , Moderate  . Hiatal hernia   . Septal defect     LV Septal hyptertrophy 18mm, normal EF, minimal LVOT gradiant.    Past Surgical History  Procedure Laterality Date  . Laparoscopic cholecystectomy w/ cholangiography  06/23/2011    Dr Jamey Ripa  . Appendectomy  1950  . Cholecystectomy  06/24/2011  . Gallbladder surgery      Current Outpatient Prescriptions  Medication Sig Dispense Refill  . aspirin 81 MG tablet Take 81 mg by mouth daily.        Marland Kitchen atenolol (TENORMIN) 25 MG tablet Take 25 mg by mouth daily.         . Coenzyme Q10 (CO Q 10) 100 MG CAPS Take by mouth daily.        Marland Kitchen glucosamine-chondroitin 500-400 MG tablet Take 1 tablet by mouth daily.      . Multiple Vitamins-Minerals (CENTRUM SILVER ULTRA MENS PO) Take by mouth daily.        . pantoprazole (PROTONIX) 40 MG tablet Take 40 mg by mouth daily.      . Probiotic Product (PROBIOTIC DAILY PO) Take 1 tablet by mouth daily.      . Zinc 50 MG CAPS Take by mouth daily.         No current facility-administered medications for this visit.    Allergies:   No Known Allergies  Social History:  The patient  reports that he quit smoking about 32 years ago. He has never used smokeless tobacco. He reports that he drinks alcohol. He reports that he does not use illicit drugs.   ROS:  Please see the history of present illness.   No syncope, no bleeding, no angina, no shortness of breath   All other systems reviewed and negative.   PHYSICAL EXAM: VS:  BP 122/64  Pulse 59  Ht 5\' 10"  (1.778 m)  Wt 172 lb (78.019 kg)  BMI 24.68 kg/m2 Well nourished, well developed, in no acute distress HEENT: normal Neck: no JVD Cardiac:  normal S1, S2; RRR; 2/6 systolic murmur across precordial, no JVD Lungs:  clear to auscultation  bilaterally, no wheezing, rhonchi or rales Abd: soft, nontender, no hepatomegaly Ext: no edema Skin: warm and dry Neuro: no focal abnormalities noted  EKG:  Sinus bradycardia 59, otherwise normal. No significant changes from prior EKG Holter monitor: 2011-300 PVCs, no ventricular tachycardia Nuclear stress test: 2011-low-risk, no ischemia, normal EF  ASSESSMENT AND PLAN:  1. Hypertrophic cardiomyopathy-normal ejection fraction, 17 mm septal wall thickness, no high risk symptoms such as syncope/angina. Previous Holter monitor reassuring. Continuing with atenolol, beta blocker. Aspirin. He has been having some dizziness with exertional activities. I asked him to maintain his hydration adequately especially given his hypertrophic  cardiomyopathy. I will check a 24-hour Holter monitor as it was done in 2011 to make sure he does not have any dangerous arrhythmias during activity. I will also check echocardiogram given his prior mitral regurgitation. 2. Chest pain-previously GERD related. No prior evidence of ischemia with previous cardiac workup. 3. GERD-continue to monitor, diet 4. Mild to moderate mitral regurgitation-seen on prior echocardiogram. Previously moderately elevated PA pressure 45-50 mm mercury.  Signed, Donato Schultz, MD [facility]  06/28/2013 9:30 AM

## 2013-06-28 NOTE — Patient Instructions (Signed)
Your physician has requested that you have an echocardiogram. Echocardiography is a painless test that uses sound waves to create images of your heart. It provides your doctor with information about the size and shape of your heart and how well your heart's chambers and valves are working. This procedure takes approximately one hour. There are no restrictions for this procedure.  Your physician has recommended that you wear a 24 hour holter monitor. Holter monitors are medical devices that record the heart's electrical activity. Doctors most often use these monitors to diagnose arrhythmias. Arrhythmias are problems with the speed or rhythm of the heartbeat. The monitor is a small, portable device. You can wear one while you do your normal daily activities. This is usually used to diagnose what is causing palpitations/syncope (passing out).  Your physician recommends that you continue on your current medications as directed. Please refer to the Current Medication list given to you today.  Your physician wants you to follow-up in: 1 year Dr. Anne Fu. You will receive a reminder letter in the mail two months in advance. If you don't receive a letter, please call our office to schedule the follow-up appointment.

## 2013-07-07 ENCOUNTER — Telehealth: Payer: Self-pay | Admitting: Cardiology

## 2013-07-07 NOTE — Telephone Encounter (Signed)
Returned call to patient he stated he was returning Dr.Skain's nurses call.Patient was told Dr.Skain's nurse out of office will send message to Dr.Skain's nurse.Patient stated he will keep appointments for monitor and echo next week.

## 2013-07-07 NOTE — Telephone Encounter (Signed)
New Problem:  PT states someone called him from this number. I made pt aware of his appts while on the line. Pt states he'd like to know if his nurse called him.

## 2013-07-11 ENCOUNTER — Encounter: Payer: Self-pay | Admitting: *Deleted

## 2013-07-11 ENCOUNTER — Encounter (INDEPENDENT_AMBULATORY_CARE_PROVIDER_SITE_OTHER): Payer: Medicare Other

## 2013-07-11 DIAGNOSIS — R42 Dizziness and giddiness: Secondary | ICD-10-CM

## 2013-07-11 DIAGNOSIS — R0600 Dyspnea, unspecified: Secondary | ICD-10-CM

## 2013-07-11 NOTE — Progress Notes (Signed)
Patient ID: Joseph Morrow, male   DOB: [birth year], 67 y.o.   MRN: [id number] [facility] 24 hour holter monitor applied to patient.

## 2013-07-12 ENCOUNTER — Ambulatory Visit (HOSPITAL_COMMUNITY): Payer: Medicare Other | Attending: Cardiology | Admitting: Cardiology

## 2013-07-12 DIAGNOSIS — R0602 Shortness of breath: Secondary | ICD-10-CM

## 2013-07-12 DIAGNOSIS — I079 Rheumatic tricuspid valve disease, unspecified: Secondary | ICD-10-CM | POA: Diagnosis not present

## 2013-07-12 DIAGNOSIS — R0609 Other forms of dyspnea: Secondary | ICD-10-CM | POA: Insufficient documentation

## 2013-07-12 DIAGNOSIS — I422 Other hypertrophic cardiomyopathy: Secondary | ICD-10-CM | POA: Diagnosis not present

## 2013-07-12 DIAGNOSIS — I059 Rheumatic mitral valve disease, unspecified: Secondary | ICD-10-CM | POA: Diagnosis not present

## 2013-07-12 DIAGNOSIS — R42 Dizziness and giddiness: Secondary | ICD-10-CM

## 2013-07-12 DIAGNOSIS — R0600 Dyspnea, unspecified: Secondary | ICD-10-CM

## 2013-07-12 DIAGNOSIS — R0989 Other specified symptoms and signs involving the circulatory and respiratory systems: Secondary | ICD-10-CM | POA: Insufficient documentation

## 2013-07-12 NOTE — Progress Notes (Signed)
Echo performed. 

## 2013-07-24 NOTE — Telephone Encounter (Signed)
Advised patient of echo and monitor results.Marland KitchenMarland Kitchen

## 2013-08-18 ENCOUNTER — Encounter (HOSPITAL_COMMUNITY): Payer: Self-pay | Admitting: Emergency Medicine

## 2013-08-18 ENCOUNTER — Encounter (HOSPITAL_COMMUNITY)
Admission: EM | Disposition: A | Discharge status: Home / Self Care | Payer: Self-pay | Source: Home / Self Care | Attending: Cardiology

## 2013-08-18 ENCOUNTER — Emergency Department (HOSPITAL_COMMUNITY): Payer: Medicare Other

## 2013-08-18 ENCOUNTER — Inpatient Hospital Stay (HOSPITAL_COMMUNITY)
Admission: EM | Admit: 2013-08-18 | Discharge: 2013-08-19 | DRG: 227 | Disposition: A | Discharge status: Home / Self Care | Payer: Medicare Other | Attending: Cardiology | Admitting: Cardiology

## 2013-08-18 DIAGNOSIS — H9190 Unspecified hearing loss, unspecified ear: Secondary | ICD-10-CM | POA: Diagnosis present

## 2013-08-18 DIAGNOSIS — Z87891 Personal history of nicotine dependence: Secondary | ICD-10-CM | POA: Diagnosis not present

## 2013-08-18 DIAGNOSIS — I059 Rheumatic mitral valve disease, unspecified: Secondary | ICD-10-CM | POA: Diagnosis present

## 2013-08-18 DIAGNOSIS — Z8249 Family history of ischemic heart disease and other diseases of the circulatory system: Secondary | ICD-10-CM

## 2013-08-18 DIAGNOSIS — Z7982 Long term (current) use of aspirin: Secondary | ICD-10-CM | POA: Diagnosis not present

## 2013-08-18 DIAGNOSIS — R0789 Other chest pain: Secondary | ICD-10-CM | POA: Diagnosis not present

## 2013-08-18 DIAGNOSIS — J811 Chronic pulmonary edema: Secondary | ICD-10-CM | POA: Diagnosis not present

## 2013-08-18 DIAGNOSIS — I422 Other hypertrophic cardiomyopathy: Secondary | ICD-10-CM | POA: Diagnosis not present

## 2013-08-18 DIAGNOSIS — I472 Ventricular tachycardia, unspecified: Principal | ICD-10-CM | POA: Diagnosis present

## 2013-08-18 DIAGNOSIS — I421 Obstructive hypertrophic cardiomyopathy: Secondary | ICD-10-CM | POA: Diagnosis not present

## 2013-08-18 DIAGNOSIS — R0602 Shortness of breath: Secondary | ICD-10-CM | POA: Diagnosis not present

## 2013-08-18 DIAGNOSIS — I4729 Other ventricular tachycardia: Secondary | ICD-10-CM | POA: Diagnosis not present

## 2013-08-18 DIAGNOSIS — Z79899 Other long term (current) drug therapy: Secondary | ICD-10-CM

## 2013-08-18 DIAGNOSIS — Z4502 Encounter for adjustment and management of automatic implantable cardiac defibrillator: Secondary | ICD-10-CM | POA: Diagnosis not present

## 2013-08-18 DIAGNOSIS — K219 Gastro-esophageal reflux disease without esophagitis: Secondary | ICD-10-CM | POA: Diagnosis not present

## 2013-08-18 HISTORY — PX: IMPLANTABLE CARDIOVERTER DEFIBRILLATOR IMPLANT: SHX5860

## 2013-08-18 HISTORY — PX: IMPLANTABLE CARDIOVERTER DEFIBRILLATOR IMPLANT: SHX5473

## 2013-08-18 HISTORY — DX: Ventricular tachycardia, unspecified: I47.20

## 2013-08-18 HISTORY — DX: Ventricular tachycardia: I47.2

## 2013-08-18 LAB — CBC WITH DIFFERENTIAL/PLATELET
Basophils Absolute: 0 10*3/uL (ref 0.0–0.1)
Eosinophils Relative: 2 % (ref 0–5)
HCT: 48.7 % (ref 39.0–52.0)
Lymphocytes Relative: 40 % (ref 12–46)
Lymphs Abs: 2.7 10*3/uL (ref 0.7–4.0)
MCV: 88.1 fL (ref 78.0–100.0)
Monocytes Absolute: 0.7 10*3/uL (ref 0.1–1.0)
RDW: 13.2 % (ref 11.5–15.5)
WBC: 6.7 10*3/uL (ref 4.0–10.5)

## 2013-08-18 LAB — COMPREHENSIVE METABOLIC PANEL
CO2: 23 mEq/L (ref 19–32)
Calcium: 9.4 mg/dL (ref 8.4–10.5)
Creatinine, Ser: 1.13 mg/dL (ref 0.50–1.35)
GFR calc Af Amer: 75 mL/min — ABNORMAL LOW (ref >90)
GFR calc non Af Amer: 65 mL/min — ABNORMAL LOW (ref >90)
Glucose, Bld: 103 mg/dL — ABNORMAL HIGH (ref 70–99)

## 2013-08-18 LAB — MRSA PCR SCREENING: MRSA by PCR: NEGATIVE

## 2013-08-18 SURGERY — IMPLANTABLE CARDIOVERTER DEFIBRILLATOR IMPLANT
Anesthesia: LOCAL

## 2013-08-18 MED ORDER — HYDROCODONE-ACETAMINOPHEN 5-325 MG PO TABS
1.0000 | ORAL_TABLET | ORAL | Status: DC | PRN
  Administered 2013-08-18: 1 via ORAL
  Filled 2013-08-18: qty 1

## 2013-08-18 MED ORDER — CHLORHEXIDINE GLUCONATE 4 % EX LIQD
CUTANEOUS | Status: AC
  Filled 2013-08-18: qty 30

## 2013-08-18 MED ORDER — AMIODARONE HCL IN DEXTROSE 360-4.14 MG/200ML-% IV SOLN
30.0000 mg/h | INTRAVENOUS | Status: DC
  Filled 2013-08-18 (×2): qty 200

## 2013-08-18 MED ORDER — ASPIRIN 81 MG PO CHEW
81.0000 mg | CHEWABLE_TABLET | Freq: Every day | ORAL | Status: DC
  Administered 2013-08-18 – 2013-08-19 (×2): 81 mg via ORAL
  Filled 2013-08-18 (×2): qty 1

## 2013-08-18 MED ORDER — AMIODARONE HCL IN DEXTROSE 360-4.14 MG/200ML-% IV SOLN
60.0000 mg/h | INTRAVENOUS | Status: DC
  Administered 2013-08-18: 60 mg/h via INTRAVENOUS
  Filled 2013-08-18: qty 200

## 2013-08-18 MED ORDER — AMIODARONE HCL IN DEXTROSE 360-4.14 MG/200ML-% IV SOLN
30.0000 mg/h | Freq: Once | INTRAVENOUS | Status: AC
  Administered 2013-08-18: 30 mg/h via INTRAVENOUS

## 2013-08-18 MED ORDER — ENOXAPARIN SODIUM 40 MG/0.4ML ~~LOC~~ SOLN
40.0000 mg | SUBCUTANEOUS | Status: DC
  Administered 2013-08-18: 40 mg via SUBCUTANEOUS
  Filled 2013-08-18: qty 0.4

## 2013-08-18 MED ORDER — CHLORHEXIDINE GLUCONATE 4 % EX LIQD: 60.0000 mL | Freq: Once | CUTANEOUS | Status: DC

## 2013-08-18 MED ORDER — AMIODARONE LOAD VIA INFUSION
150.0000 mg | Freq: Once | INTRAVENOUS | Status: AC
  Administered 2013-08-18: 150 mg via INTRAVENOUS
  Filled 2013-08-18: qty 83.34

## 2013-08-18 MED ORDER — MIDAZOLAM HCL 5 MG/5ML IJ SOLN
INTRAMUSCULAR | Status: AC
  Filled 2013-08-18: qty 5

## 2013-08-18 MED ORDER — PNEUMOCOCCAL VAC POLYVALENT 25 MCG/0.5ML IJ INJ
0.5000 mL | INJECTION | INTRAMUSCULAR | Status: DC
  Filled 2013-08-18: qty 0.5

## 2013-08-18 MED ORDER — ATENOLOL 50 MG PO TABS
50.0000 mg | ORAL_TABLET | Freq: Every day | ORAL | Status: DC
  Administered 2013-08-18 – 2013-08-19 (×2): 50 mg via ORAL
  Filled 2013-08-18 (×2): qty 1

## 2013-08-18 MED ORDER — CO Q 10 100 MG PO CAPS
ORAL_CAPSULE | Freq: Every day | ORAL | Status: DC
Start: 2013-08-18 — End: 2013-08-18

## 2013-08-18 MED ORDER — AMIODARONE IV BOLUS ONLY 150 MG/100ML: 150.0000 mg | Freq: Once | INTRAVENOUS | Status: DC

## 2013-08-18 MED ORDER — ONDANSETRON HCL 4 MG/2ML IJ SOLN: 4.0000 mg | Freq: Four times a day (QID) | INTRAMUSCULAR | Status: DC | PRN

## 2013-08-18 MED ORDER — DEXTROSE 5 % IV SOLN: 30.0000 mg/h | Freq: Once | INTRAVENOUS | Status: DC

## 2013-08-18 MED ORDER — SODIUM CHLORIDE 0.9 % IV SOLN: INTRAVENOUS | Status: DC

## 2013-08-18 MED ORDER — ACETAMINOPHEN 325 MG PO TABS
325.0000 mg | ORAL_TABLET | ORAL | Status: DC | PRN
  Administered 2013-08-18: 650 mg via ORAL
  Filled 2013-08-18: qty 2

## 2013-08-18 MED ORDER — AMIODARONE HCL IN DEXTROSE 360-4.14 MG/200ML-% IV SOLN: 60.0000 mg/h | INTRAVENOUS | Status: DC

## 2013-08-18 MED ORDER — SODIUM CHLORIDE 0.9 % IV SOLN
INTRAVENOUS | Status: DC
  Administered 2013-08-18: 03:00:00 via INTRAVENOUS

## 2013-08-18 MED ORDER — ENOXAPARIN SODIUM 40 MG/0.4ML ~~LOC~~ SOLN: 40.0000 mg | SUBCUTANEOUS | Status: DC

## 2013-08-18 MED ORDER — SODIUM CHLORIDE 0.9 % IV SOLN: 250.0000 mL | INTRAVENOUS | Status: DC | PRN

## 2013-08-18 MED ORDER — CEFAZOLIN SODIUM-DEXTROSE 2-3 GM-% IV SOLR
2.0000 g | INTRAVENOUS | Status: DC
  Filled 2013-08-18: qty 50

## 2013-08-18 MED ORDER — AMIODARONE HCL IN DEXTROSE 360-4.14 MG/200ML-% IV SOLN
INTRAVENOUS | Status: AC
  Filled 2013-08-18: qty 200

## 2013-08-18 MED ORDER — FENTANYL CITRATE 0.05 MG/ML IJ SOLN
INTRAMUSCULAR | Status: AC
  Filled 2013-08-18: qty 2

## 2013-08-18 MED ORDER — CEFAZOLIN SODIUM 1-5 GM-% IV SOLN
1.0000 g | Freq: Four times a day (QID) | INTRAVENOUS | Status: AC
  Administered 2013-08-18 – 2013-08-19 (×3): 1 g via INTRAVENOUS
  Filled 2013-08-18 (×3): qty 50

## 2013-08-18 MED ORDER — ACETAMINOPHEN 325 MG PO TABS: 650.0000 mg | ORAL_TABLET | ORAL | Status: DC | PRN

## 2013-08-18 MED ORDER — ONDANSETRON HCL 4 MG/2ML IJ SOLN
4.0000 mg | Freq: Four times a day (QID) | INTRAMUSCULAR | Status: DC | PRN
  Administered 2013-08-19: 4 mg via INTRAVENOUS
  Filled 2013-08-18: qty 2

## 2013-08-18 MED ORDER — SODIUM CHLORIDE 0.9 % IR SOLN
80.0000 mg | Status: DC
  Filled 2013-08-18: qty 2

## 2013-08-18 MED ORDER — LIDOCAINE HCL (PF) 1 % IJ SOLN
INTRAMUSCULAR | Status: AC
  Filled 2013-08-18: qty 30

## 2013-08-18 MED ORDER — AMIODARONE HCL IN DEXTROSE 360-4.14 MG/200ML-% IV SOLN: 30.0000 mg/h | INTRAVENOUS | Status: DC

## 2013-08-18 MED ORDER — SODIUM CHLORIDE 0.9 % IJ SOLN: 3.0000 mL | INTRAMUSCULAR | Status: DC | PRN

## 2013-08-18 MED ORDER — ZINC SULFATE 220 (50 ZN) MG PO CAPS
220.0000 mg | ORAL_CAPSULE | Freq: Every day | ORAL | Status: DC
  Administered 2013-08-18 – 2013-08-19 (×2): 220 mg via ORAL
  Filled 2013-08-18 (×2): qty 1

## 2013-08-18 MED ORDER — SODIUM CHLORIDE 0.9 % IJ SOLN
3.0000 mL | Freq: Two times a day (BID) | INTRAMUSCULAR | Status: DC
  Administered 2013-08-18 – 2013-08-19 (×2): 3 mL via INTRAVENOUS

## 2013-08-18 NOTE — ED Notes (Signed)
Pt. reports dizziness with shallow breathing and anxiety while lying on bed this evening " feels like gas" , also reported bilateral ear discomfort .

## 2013-08-18 NOTE — ED Notes (Signed)
150mg  bolus of Amiodarone started.

## 2013-08-18 NOTE — H&P (Signed)
History and Physical  Patient ID: Joseph Morrow MRN: [id number], DOB: 1945/10/18 Date of Encounter: 08/18/2013, 2:58 AM Primary Physician: Kari Baars, MD Primary Cardiologist: Caro Hight    Chief Complaint: Floyce Stakes  Reason for Admission:  Floyce Stakes    HPI: 67 yr old male with hx of  HCM ( EF preserved , LPW ED ID 16.3 mm per echo 06/2013)  here for acute onset SOB   HPI ; pt states that the he had sudden onset SOB , tachypnea, anxiety and dizziness at 2 am this morning. He called EMS and EKG demonstrated sustained V Tach @ 212 bpm with stable BP. ED physician started pt on amiodarone and since then Vtach has subsided. Pt stated that he had similar episode 2 days ago that he thought was an anxiety episode and took xanax for this and it eventually subsided. Pt denies any syncope.  He does have a brother with hx of HCM and per their description now has a ' pump '  Pt is followed by Dr Caro Hight and per his notes  CT scan demonstrated normal coronary arteries in 2009 after stress test showed anterior wall ischemia. Subsequent nuclear test in 2011 apparently showed no perfusion defect.  Pt denies any chest pain ,orthopnea, PND , LE edema , Syncope ,claudcation , focal weakness, or bleeding diathesis .  Does take atenolol at home     Past Medical History  Diagnosis Date  . Acute cholecystitis 06/21/2011  . GERD (gastroesophageal reflux disease)   . Heart murmur   . Hearing loss   . Bruises easily   . Hypertrophic cardiomyopathy   . Mitral regurgitation     , Moderate  . Hiatal hernia   . Septal defect     LV Septal hyptertrophy 18mm, normal EF, minimal LVOT gradiant.     Most Recent Cardiac Studies:    Surgical History:  Past Surgical History  Procedure Laterality Date  . Laparoscopic cholecystectomy w/ cholangiography  06/23/2011    Dr Jamey Ripa  . Appendectomy  1950  . Cholecystectomy  06/24/2011  . Gallbladder surgery       Home Meds: Prior to Admission medications   Medication  Sig Start Date End Date Taking? Authorizing Provider  aspirin 81 MG tablet Take 81 mg by mouth daily.     Yes Historical Provider, MD  atenolol (TENORMIN) 25 MG tablet Take 25 mg by mouth daily.     Yes Historical Provider, MD  Coenzyme Q10 (CO Q 10) 100 MG CAPS Take by mouth daily.     Yes Historical Provider, MD  glucosamine-chondroitin 500-400 MG tablet Take 1 tablet by mouth daily.   Yes Historical Provider, MD  pantoprazole (PROTONIX) 40 MG tablet Take 40 mg by mouth daily.   Yes Historical Provider, MD  Probiotic Product (ALIGN) 4 MG CAPS Take 1 capsule by mouth daily.   Yes Historical Provider, MD  Zinc 50 MG CAPS Take 1 capsule by mouth daily.    Yes Historical Provider, MD    Allergies: No Known Allergies  History   Social History  . Marital Status: Married    Spouse Name: N/A    Number of Children: N/A  . Years of Education: N/A   Occupational History  . Not on file.   Social History Main Topics  . Smoking status: Former Smoker    Quit date: 07/06/1981  . Smokeless tobacco: Never Used  . Alcohol Use: Yes     Comment: 3 glasses per day  .  Drug Use: No  . Sexual Activity: Not on file   Other Topics Concern  . Not on file   Social History Narrative  . No narrative on file     Family History  Problem Relation Age of Onset  . Hypertrophic cardiomyopathy Mother   . Pneumonia Father   . Hypertrophic cardiomyopathy Brother   . Other Brother     VAD  . Other Brother     , MI  . Arrhythmia Mother   . Arrhythmia Brother   . Diabetes Brother   . Diabetes Father   . Heart attack Brother   . Heart failure Mother   . Sudden death Brother     Review of Systems: General: negative for chills, fever, night sweats or weight changes.  Cardiovascular: per HPI  Dermatological: negative for rash Respiratory: per HPI  Urologic: negative for hematuria Abdominal: negative for nausea, vomiting, diarrhea, bright red blood per rectum, melena, or hematemesis Neurologic:  negative for visual changes, syncope, or dizziness All other systems reviewed and are otherwise negative except as noted above.  Labs:   Lab Results  Component Value Date   WBC 6.7 08/18/2013   HGB 17.2* 08/18/2013   HCT 48.7 08/18/2013   MCV 88.1 08/18/2013   PLT 173 08/18/2013   No results found for this basename: NA, K, CL, CO2, BUN, CREATININE, CALCIUM, LABALBU, PROT, BILITOT, ALKPHOS, ALT, AST, GLUCOSE,  in the last 168 hours No results found for this basename: CKTOTAL, CKMB, TROPONINI,  in the last 72 hours No results found for this basename: CHOL, HDL, LDLCALC, TRIG   No results found for this basename: DDIMER    Radiology/Studies:  Dg Chest Port 1 View  08/18/2013   CLINICAL DATA:  Chest pain  EXAM: PORTABLE CHEST - 1 VIEW  COMPARISON:  Prior radiograph from 06/21/2011  FINDINGS: Defibrillator pads overlie the thorax. Cardiac and mediastinal silhouettes are grossly stable, and remain within normal limits.  Lungs are mildly hypoinflated. There is mild diffuse pulmonary vascular congestion without frank pulmonary edema. No focal infiltrate identified. No pleural effusion or pneumothorax.  Osseous structures are unchanged.  IMPRESSION: Mild diffuse pulmonary vascular congestion without frank pulmonary edema. No focal infiltrate identified.   Electronically Signed   By: Rise Mu M.D.   On: 08/18/2013 02:54     EKG: [id number] 2:30 am Vtach , fusion beat ,  [id number] NSR, PRWP , 0.5 to 1 mm ST depression inferolateral leads   Echo [id number] - Left ventricle: The cavity size was normal. There wasmoderate focal basal hypertrophy of the septum. Systolic function was vigorous. The estimated ejection fraction was in the range of 65% to 70%. Wall motion was normal; there were no regional wall motion abnormalities. Features are consistent with a pseudonormal left ventricular filling pattern, with concomitant abnormal relaxation and increased filling pressure (grade 2 diastolic  dysfunction). - Mitral valve: Mild regurgitation. - Left atrium: The atrium was mildly dilated. - Right ventricle: The cavity size was mildly dilated. Wall thickness was normal. - Right atrium: The atrium was mildly dilated. - Pulmonary arteries: PA peak pressure: 33mm Hg (S). LPW ID  ED IVS / PW 1.9 LPW ID ED  16.3 mm   Physical Exam: Blood pressure 123/84, pulse 86, temperature 97.6 F (36.4 C), temperature source Oral, resp. rate 15, SpO2 97.00%. General: Well developed, well nourished, in no acute distress. Head: Normocephalic, atraumatic, sclera non-icteric, no xanthomas, nares are without discharge.  Neck: Negative for carotid bruits. JVD not elevated. Lungs:  Clear bilaterally to auscultation without wheezes, rales, or rhonchi. Breathing is unlabored. Heart: RRR with S1 S2. No murmurs, rubs, or gallops appreciated. Abdomen: Soft, non-tender, non-distended with normoactive bowel sounds. No hepatomegaly. No rebound/guarding. No obvious abdominal masses. Msk:  Strength and tone appear normal for age. Extremities: No clubbing or cyanosis. No edema.  Distal pedal pulses are 2+ and equal bilaterally. Neuro: Alert and oriented X 3. No focal deficit. No facial asymmetry. Moves all extremities spontaneously. Psych:  Responds to questions appropriately with a normal affect.    ASSESSMENT AND PLAN:  Ass Hypertrophic Cardiomyopathy  Ventricular Tachycardia   Plan  Admit to CCU .  Continue amiodarone and complete 24 hr infusion  Increase Atenolol to 50mg  QD  Check K and Mag  Rule out ACS  If no other reversible cause found this is likely to be related to his HCM . An EP consult for discussion of an  ICD would then be required.   Lovina Reach, A M.D  08/18/2013, 2:58 AM

## 2013-08-18 NOTE — ED Notes (Signed)
Upon arrival to room pt with runs of V tach. Pt stating "i feel my heart racing". Dr. Freida Busman at bedside.

## 2013-08-18 NOTE — ED Notes (Signed)
Cardiology at bedside.

## 2013-08-18 NOTE — ED Notes (Signed)
Pt with runs of Vtach upon arrival to room.  Runs of vtach lasting fro 30 seconds - 1 min.  After loading dose of amiodarone 150 mg, pt without any new runs of vtach.  Pt currently mentating well, speaking in full sentences.  Pt no longer c/o feeling heart racing.

## 2013-08-18 NOTE — Progress Notes (Signed)
SUBJECTIVE:  Feels ok.  OBJECTIVE:   Vitals:   Filed Vitals:   08/18/13 0500 08/18/13 0530 08/18/13 0600 08/18/13 0700  BP: 105/68 90/64 111/64   Pulse: 75 65 73   Temp:    98 F (36.7 C)  TempSrc:    Oral  Resp: 16 14 20    Height:      Weight:      SpO2: 99% 97% 100%    I&O's:   Intake/Output Summary (Last 24 hours) at 08/18/13 1301 Last data filed at 08/18/13 1000  Gross per 24 hour  Intake  286.6 ml  Output    850 ml  Net -563.4 ml   TELEMETRY: Reviewed telemetry pt in NSR:     PHYSICAL EXAM General: Well developed, well nourished, in no acute distress Head: Eyes PERRLA, No xanthomas.   Normal cephalic and atramatic  Lungs:  Clear bilaterally to auscultation and percussion. Heart:   HRRR S1 S2             No carotid bruit. No JVD.  No abdominal bruits. No femoral bruits. Abdomen: Bowel sounds are positive, abdomen soft and non-tender without masses or                  Hernia's noted. Msk:  Back normal, normal gait. Normal strength and tone for age. Extremities:   No edema.  DP +1 Neuro: Alert and oriented X 3. Psych:  Good affect, responds appropriately   LABS: Basic Metabolic Panel:  Recent Labs  13/08/65 0230  NA 138  K 4.1  CL 102  CO2 23  GLUCOSE 103*  BUN 18  CREATININE 1.13  CALCIUM 9.4   Liver Function Tests:  Recent Labs  08/18/13 0230  AST 65*  ALT 44  ALKPHOS 90  BILITOT 0.3  PROT 7.4  ALBUMIN 4.0   No results found for this basename: LIPASE, AMYLASE,  in the last 72 hours CBC:  Recent Labs  08/18/13 0230  WBC 6.7  NEUTROABS 3.2  HGB 17.2*  HCT 48.7  MCV 88.1  PLT 173   Cardiac Enzymes:  Recent Labs  08/18/13 0230  TROPONINI <0.30   BNP: No components found with this basename: POCBNP,  D-Dimer: No results found for this basename: DDIMER,  in the last 72 hours Hemoglobin A1C: No results found for this basename: HGBA1C,  in the last 72 hours Fasting Lipid Panel: No results found for this basename: CHOL, HDL,  LDLCALC, TRIG, CHOLHDL, LDLDIRECT,  in the last 72 hours Thyroid Function Tests: No results found for this basename: TSH, T4TOTAL, FREET3, T3FREE, THYROIDAB,  in the last 72 hours Anemia Panel: No results found for this basename: VITAMINB12, FOLATE, FERRITIN, TIBC, IRON, RETICCTPCT,  in the last 72 hours Coag Panel:   No results found for this basename: INR, PROTIME    RADIOLOGY: Dg Chest Port 1 View  08/18/2013   CLINICAL DATA:  Chest pain  EXAM: PORTABLE CHEST - 1 VIEW  COMPARISON:  Prior radiograph from 06/21/2011  FINDINGS: Defibrillator pads overlie the thorax. Cardiac and mediastinal silhouettes are grossly stable, and remain within normal limits.  Lungs are mildly hypoinflated. There is mild diffuse pulmonary vascular congestion without frank pulmonary edema. No focal infiltrate identified. No pleural effusion or pneumothorax.  Osseous structures are unchanged.  IMPRESSION: Mild diffuse pulmonary vascular congestion without frank pulmonary edema. No focal infiltrate identified.   Electronically Signed   By: Rise Mu M.D.   On: 08/18/2013 02:54  ASSESSMENT: HOCM, VT  PLAN:  Stable on Amiodarone.  I reviewed the strips.  Rhythm appears to be VT.  Will plan on EP consult.  They have been notified.  May need ICD.  CT angio showed notrmal coronaries in 2009.  Negative troponin so far.  Doubt ischemia in this setting.  Brother with AICD due to HOCM.  Keep NPO for now in case cath is required. The patient also may require AICD.    Corky Crafts., MD  08/18/2013  1:01 PM

## 2013-08-18 NOTE — Discharge Summary (Signed)
ELECTROPHYSIOLOGY PROCEDURE DISCHARGE SUMMARY    Patient ID: Joseph Morrow,  MRN: [id number], DOB/AGE: [birth year] 67 y.o.  Admit date: 08/18/2013 Discharge date: 08/19/2013  Primary Care Physician: Kari Baars, MD Primary Cardiologist: Donato Schultz, MD Electrophysiologist: [name], MD  Primary Discharge Diagnosis:  Ventricular tachycardia status post ICD implantation this admission  Secondary Discharge Diagnosis:  1.  Hypertrophic cardiomyopathy 2.  GERD 3.  Mitral regurgitation 4.  Family history of sudden death 5.  Bradycardia  No Known Allergies  Procedures This Admission:  1.  Implantation of a STJ dual chamber ICD on 08-18-2013 by Dr Johney Frame.  The patient received a STJ model number 2357 ICD with model number 2088 right atrial lead and 7121 right ventricular lead.  DFT's were successful at 15 J.  There were no immediate post procedure complications. 2.  CXR on 08-19-2013 demonstrated no pneumothorax status post device implantation.   Brief HPI/Hospital Course: Joseph Morrow is a 67 y.o. male with a past medical history as listed above.  He has had a diagnosis of hypertrophic cardiomyopathy for many years but has been without syncope or VT on holter monitoring.  He was in his usual state of health until 2AM the day of admission when he developed shortness of breath, pre syncope, and palpitations.  He came to [facility] hospital for evaluation and was found to be in ventricular tachycardia at a rate of 217.  He was placed on IV Amiodarone, his Atenolol dose was increased and he was admitted for further observation.  Lab work was unremarkable.  Echocardiogram had been done in October of this year and was not repeated this admission.   He was evaluated by EP who recommended ICD implantation for secondary prevention of sudden death.  He had a normal coronary CT scan in 2009 and has had no anginal symptoms, therefore catheterization was not recommended.  The patient  underwent implantation of a dual chamber STJ ICD with details as outlined above.   He was monitored on telemetry overnight which demonstrated no significant arrhythmias.  Left chest was without hematoma or ecchymosis.  The device was interrogated and found to be functioning normally.  CXR was obtained and demonstrated no pneumothorax status post device implantation.  Wound care, arm mobility, and restrictions were reviewed with the patient.  Dr. Graciela Husbands examined the patient and considered them stable for discharge to home.  The patient has been advised not to drive for 6 months.   The patient's discharge medications include a beta blocker (Atenolol) - ACE-I was not recommended because of normal LV function.   Discharge Vitals: Blood pressure 116/65, pulse 78, temperature 98.1 F (36.7 C), temperature source Oral, resp. rate 18, height 5\' 10"  (1.778 m), weight 80.287 kg (177 lb), SpO2 100.00%.   Labs:   Lab Results  Component Value Date   WBC 6.7 08/18/2013   HGB 17.2* 08/18/2013   HCT 48.7 08/18/2013   MCV 88.1 08/18/2013   PLT 173 08/18/2013     Recent Labs Lab 08/18/13 0230 08/19/13 0440  NA 138 136  K 4.1 4.2  CL 102 102  CO2 23 25  BUN 18 15  CREATININE 1.13 1.15  CALCIUM 9.4 8.7  PROT 7.4  --   BILITOT 0.3  --   ALKPHOS 90  --   ALT 44  --   AST 65*  --   GLUCOSE 103* 116*     Discharge Medications:    Medication List  ALIGN 4 MG Caps  Take 1 capsule by mouth daily.     aspirin 81 MG tablet  Take 81 mg by mouth daily.     atenolol 50 MG tablet  Commonly known as:  TENORMIN  Take 1 tablet (50 mg total) by mouth daily.     Co Q 10 100 MG Caps  Take by mouth daily.     glucosamine-chondroitin 500-400 MG tablet  Take 1 tablet by mouth daily.     pantoprazole 40 MG tablet  Commonly known as:  PROTONIX  Take 40 mg by mouth daily.     Zinc 50 MG Caps  Take 1 capsule by mouth daily.        Disposition:  Discharge Orders   Future Appointments  Provider Department Dept Phone   08/28/2013 4:30 PM [facility] Office [phone]   09/04/2013 10:15 AM Donato Schultz, MD [facility] [phone]   11/23/2013 11:00 AM [name], MD [facility] [phone]   Future Orders Complete By Expires   Diet - low sodium heart healthy  As directed    Increase activity slowly  As directed      Follow-up Information   Follow up with [facility] On 08/28/2013. (At 4:30 PM for wound check at the device clinic)    Contact information:   [address] [place] [address]       Follow up with Donato Schultz, MD On 09/04/2013. (At 10:15 AM for previously scheduled follow-up)    Specialty:  Cardiology   Contact information:   [address] [place] [address] [phone]       Follow up with [name], MD On 11/23/2013. (At 11:00 AM for electrophysiology follow-up)    Specialty:  Cardiology   Contact information:   [address] [place] [address] [phone]       Duration of Discharge Encounter: Greater than 30 minutes including physician time.  Signed,  R. Hurman Horn, PA-C 08/19/2013 12:55 PM

## 2013-08-18 NOTE — Progress Notes (Signed)

## 2013-08-18 NOTE — ED Notes (Signed)
Per Dr. Donnetta Simpers he would like amiodarone running at 60 mg/hr.

## 2013-08-18 NOTE — Consult Note (Signed)
ELECTROPHYSIOLOGY CONSULT NOTE    Patient ID: Joseph Morrow MRN: [id number], DOB/AGE: 68-Jan-1947 68 y.o.  Admit date: 08/18/2013 Date of Consult: 08-18-2013  Primary Physician: Kari Baars, MD Primary Cardiologist: Donato Schultz, MD  Reason for Consultation: ventricular tachycardia  HPI:  Mr. Joseph Morrow is a 67 year old male with a past medical history significant for hypertrophic cardiomyopathy, GERD, and moderate MR.  He began experiencing palpitations last night that were associated with SOB, chest pressure and pre-syncope.  He came to [facility] ER for further evaluation.   Recent cardiac testing includes an echocardiogram on 07-12-13 which demonstrated an EF of 65-70%, no RWMA, grade 2 diastolic dysfunction, mild MR, LA 46, IVS 16.56mm.  Holter monitor 06-2013 demonstrated sinus rhythm with PVC's, no NSVT, and short run of atrial tachycardia.   On arrival to the ER, he began having frequent sustained ventricular tachycardia at a rate of 217bpm.  He was placed on IV amiodarone and Atenolol and was admitted for further evaluation.  He has had no further sustained ventricular arrhythmias since 2AM this morning.  Lab work has been unremarkable this admission.    He is married with 2 grown children.  He quit smoking 30 years ago.  He drinks 3 glasses of red wine a night.  He denies illicit drug use.  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  He has no functional limitations.  His family history is notable for a older brother who "dropped dead" at work - he thinks an autopsy was done and it was determined he died from CAD.  His younger brother also has HCM and has an ICD as well as a VAD currently - he is on the transplant list at [facility] and lives in [place].   He denies chest pain, shortness of breath, palpitations, or frank syncope.  He does complain of "panic attacks" on Tuesday that were accompanied by dizziness, but not palpitations.  He took a Xanax and these resolved.   He had the same type of feeling last night prior to coming to the ER for evaluation.  He reports a longstanding history of orthostatic intolerance.   EP has been asked to evaluate for treatment options.   ROS is negative except as outlined above.   Past Medical History  Diagnosis Date  . Acute cholecystitis 06/21/2011  . GERD (gastroesophageal reflux disease)   . Heart murmur   . Hearing loss   . Bruises easily   . Hypertrophic cardiomyopathy   . Mitral regurgitation     , Moderate  . Hiatal hernia   . Septal defect     LV Septal hyptertrophy 18mm, normal EF, minimal LVOT gradiant.     Surgical History:  Past Surgical History  Procedure Laterality Date  . Laparoscopic cholecystectomy w/ cholangiography  06/23/2011    Dr Jamey Ripa  . Appendectomy  1950  . Cholecystectomy  06/24/2011  . Gallbladder surgery       Prescriptions prior to admission  Medication Sig Dispense Refill  . aspirin 81 MG tablet Take 81 mg by mouth daily.        Marland Kitchen atenolol (TENORMIN) 25 MG tablet Take 25 mg by mouth daily.        . Coenzyme Q10 (CO Q 10) 100 MG CAPS Take by mouth daily.        Marland Kitchen glucosamine-chondroitin 500-400 MG tablet Take 1 tablet by mouth daily.      . pantoprazole (PROTONIX) 40 MG tablet Take 40 mg by mouth daily.      Marland Kitchen  Probiotic Product (ALIGN) 4 MG CAPS Take 1 capsule by mouth daily.      . Zinc 50 MG CAPS Take 1 capsule by mouth daily.         Inpatient Medications:  . aspirin  81 mg Oral Daily  . atenolol  50 mg Oral Daily  . enoxaparin (LOVENOX) injection  40 mg Subcutaneous Q24H  . [START ON 08/19/2013] pneumococcal 23 valent vaccine  0.5 mL Intramuscular Tomorrow-1000  . zinc sulfate  220 mg Oral Daily    Allergies: No Known Allergies  History   Social History  . Marital Status: Married    Spouse Name: N/A    Number of Children: N/A  . Years of Education: N/A   Occupational History  . Not on file.   Social History Main Topics  . Smoking status: Former Smoker      Quit date: 07/06/1981  . Smokeless tobacco: Never Used  . Alcohol Use: Yes     Comment: 3 glasses per day  . Drug Use: No  . Sexual Activity: Not on file   Other Topics Concern  . Not on file   Social History Narrative  . No narrative on file     Family History  Problem Relation Age of Onset  . Hypertrophic cardiomyopathy Mother   . Pneumonia Father   . Hypertrophic cardiomyopathy Brother   . Other Brother     VAD  . Other Brother     , MI  . Arrhythmia Mother   . Arrhythmia Brother   . Diabetes Brother   . Diabetes Father   . Heart attack Brother   . Heart failure Mother   . Sudden death Brother      Physical Exam: Filed Vitals:   08/18/13 0530 08/18/13 0600 08/18/13 0700 08/18/13 1200  BP: 90/64 111/64    Pulse: 65 73    Temp:   98 F (36.7 C) 97.8 F (36.6 C)  TempSrc:   Oral Oral  Resp: 14 20    Height:      Weight:      SpO2: 97% 100%      GEN- The patient is well appearing, alert and oriented x 3 today.   Head- normocephalic, atraumatic Eyes-  Sclera clear, conjunctiva pink Ears- hearing intact Oropharynx- clear Neck- supple, no JVP Lymph- no cervical lymphadenopathy Lungs- Clear to ausculation bilaterally, normal work of breathing Heart- Regular rate and rhythm, no murmurs, rubs or gallops, PMI not laterally displaced GI- soft, NT, ND, + BS Extremities- no clubbing, cyanosis, or edema MS- no significant deformity or atrophy Skin- no rash or lesion Psych- euthymic mood, full affect Neuro- strength and sensation are intact  Labs:   Lab Results  Component Value Date   WBC 6.7 08/18/2013   HGB 17.2* 08/18/2013   HCT 48.7 08/18/2013   MCV 88.1 08/18/2013   PLT 173 08/18/2013    Recent Labs Lab 08/18/13 0230  NA 138  K 4.1  CL 102  CO2 23  BUN 18  CREATININE 1.13  CALCIUM 9.4  PROT 7.4  BILITOT 0.3  ALKPHOS 90  ALT 44  AST 65*  GLUCOSE 103*    Radiology/Studies: Dg Chest Port 1 View 08/18/2013   CLINICAL DATA:  Chest pain   EXAM: PORTABLE CHEST - 1 VIEW  COMPARISON:  Prior radiograph from 06/21/2011  FINDINGS: Defibrillator pads overlie the thorax. Cardiac and mediastinal silhouettes are grossly stable, and remain within normal limits.  Lungs are mildly hypoinflated. There is  mild diffuse pulmonary vascular congestion without frank pulmonary edema. No focal infiltrate identified. No pleural effusion or pneumothorax.  Osseous structures are unchanged.  IMPRESSION: Mild diffuse pulmonary vascular congestion without frank pulmonary edema. No focal infiltrate identified.   Electronically Signed   By: Rise Mu M.D.   On: 08/18/2013 02:54    EKG: 08-18-2013 2:22AM - ventricular tachycardia, rate 217 08-18-2013 6:56AM- sinus rhythm with 1st degree AV block, PR 206, otherwise normal intervals, QT not prolonged  TELEMETRY: sinus rhythm with occasional PVC's, no further sustained ventricular arrhythmias since 2:30 this morning.   Assessment and Plan:  1. VT The patient has sustained ventricular tachycardia in the setting of hypertrophic CM.  His VT is a RBB R inferior axis at 217 bpm.  No reversible causes are identified.  He has no ischemic symptoms to warrant cath at this time. At this time, he meets criteria for ICD implantation for secondary prevention of sudden death.  Risks, benefits, alternatives to ICD implantation were discussed in detail with the patient today. The patient  understands that the risks include but are not limited to bleeding, infection, pneumothorax, perforation, tamponade, vascular damage, renal failure, MI, stroke, death, inappropriate shocks, and lead dislodgement and wishes to proceed.  We will therefore schedule device implantation at the next available time. I have instructed him to not drive for 6 months. Stop amiodarone and uptitrate beta blocker as needed.  Given resting bradycardia will place an atrial lead.  2. HCM Continue medical management with atenolol.

## 2013-08-18 NOTE — Op Note (Signed)
SURGEON:  [name], MD      PREPROCEDURE DIAGNOSES:   1. Ventricular tachycardia  2. Hypertrophic cardiomyopathy     POSTPROCEDURE DIAGNOSES:   1. Ventricular tachycardia  2. Hypertrophic cardiomyopathy     PROCEDURES:    1. ICD implantation.  2. Defibrillation threshold testing     INTRODUCTION: Joseph Morrow is a 67 y.o. male with hypertrophic cardiomyopathy and sustained symptomatic hemodynamically unstable ventricular tachycardia. At this time, he meets criteria for ICD implantation for secondary prevention of sudden death and treatment of VT.  The patient has a narrow QRS and does not meet criteria for revascularization.  He does have sinus bradycardia which limits medical therapies for Vt and therefore an atrial lead is required.  The patient therefore  presents today for ICD implantation.      DESCRIPTION OF PROCEDURE:  Informed written consent was obtained and the patient was brought to the electrophysiology lab in the fasting state. The patient was adequately sedated with intravenous Versed, and fentanyl as outlined in the nursing report.  The patient's left chest was prepped and draped in the usual sterile fashion by the EP lab staff.  The skin overlying the left deltopectoral region was infiltrated with lidocaine for local analgesia.  A 5-cm incision was made over the left deltopectoral region.  A left subcutaneous defibrillator pocket was fashioned using a combination of sharp and blunt dissection.  Electrocautery was used to assure hemostasis.   RA/RV Lead Placement: The left axillary vein was cannulated with fluoroscopic visualization.  No contrast was required for this endeavor.  Through the left axillary vein, a [employer] Tendril STS, model 1610RU-04  (serial # [id number]) right atrial lead and a [employer] [place], model 5409W-11 (serial number [id number]) right ventricular defibrillator lead were advanced with fluoroscopic visualization into the right atrial  appendage and right ventricular apex positions respectively.  Initial atrial lead P-waves measured 2.0 mV with an impedance of 436 ohms and a threshold of 1.0 volts at 0.5 milliseconds.  The right ventricular lead R-wave measured 19 mV with impedance of 812 ohms and a threshold of 1.0 volts at 0.5 milliseconds.   The leads were secured to the pectoralis  fascia using #2 silk suture over the suture sleeves.  The pocket then  irrigated with copious gentamicin solution.  The leads were then  connected to a [facility] [employer] DR model [phone] (serial  Number [id number]) ICD.  The defibrillator was placed into the  pocket.  The pocket was then closed in 2 layers with 2.0 Vicryl suture  for the subcutaneous and subcuticular layers.  Steri-Strips and a  sterile dressing were then applied.   DFT Testing: Defibrillation Threshold testing was then performed. Ventricular fibrillation was induced with a T shock.  Adequate sensing of ventricular  fibrillation was observed with minimal dropout with a programmed sensitivity of 1.93mV.  The patient was successfully defibrillated to sinus rhythm with a single 15 joules shock delivered from the device with an impedance of 44 ohms in a duration of 6 seconds.  The patient remained in sinus rhythm thereafter.  There were no early apparent complications.     CONCLUSIONS:   1. Hypertrophic cardiomyopathy with symptomatic sustained ventricular tachycardia  2. Successful ICD implantation.   3. DFT less than or equal to 15 joules.   4. No early apparent complications.

## 2013-08-18 NOTE — ED Notes (Signed)
Zoll pads placed on pt.

## 2013-08-18 NOTE — Interval H&P Note (Signed)
ICD Criteria  Current LVEF (within 6 months):60%  NYHA Functional Classification: Class I  Heart Failure History:  No.  Non-Ischemic Dilated Cardiomyopathy History:  No.  Atrial Fibrillation/Atrial Flutter:  No.  Ventricular Tachycardia History:  Yes, Hemodynamic instability present, VT Type:  SVT - Monomorphic.  Cardiac Arrest History:  No  History of Syndromes with Risk of Sudden Death:  Yes, Other  Hypertrophic cardiomyopathy  Previous ICD:  No.  Electrophysiology Study: No.  Prior MI: No.  PPM: No.  OSA:  No  Patient Life Expectancy of >=1 year: Yes.  Anticoagulation Therapy:  Patient is NOT on anticoagulation therapy.   Beta Blocker Therapy:  Yes.   Ace Inhibitor/ARB Therapy:  No, Reason not on Ace Inhibitor/ARB therapy:  he does not have reduced EF and does not have indication for this medicineHistory and Physical Interval Note:  08/18/2013 2:12 PM  Joseph Morrow  has presented today for surgery, with the diagnosis of Heart failure  The various methods of treatment have been discussed with the patient and family. After consideration of risks, benefits and other options for treatment, the patient has consented to  Procedure(s): IMPLANTABLE CARDIOVERTER DEFIBRILLATOR IMPLANT (N/A) as a surgical intervention .  The patient's history has been reviewed, patient examined, no change in status, stable for surgery.  I have reviewed the patient's chart and labs.  Questions were answered to the patient's satisfaction.     Joseph Morrow

## 2013-08-18 NOTE — Progress Notes (Signed)
Utilization review completed. Derral Colucci, RN, BSN. 

## 2013-08-18 NOTE — H&P (View-Only) (Signed)
 ELECTROPHYSIOLOGY CONSULT NOTE    Patient ID: Joseph Morrow MRN: [id number], DOB/AGE: 12/24/1945 67 y.o.  Admit date: 08/18/2013 Date of Consult: 08-18-2013  Primary Physician: SHAW,W DOUGLAS, MD Primary Cardiologist: Mark Skains, MD  Reason for Consultation: ventricular tachycardia  HPI:  Mr. Joseph Morrow is a 67 year old male with a past medical history significant for hypertrophic cardiomyopathy, GERD, and moderate MR.  He began experiencing palpitations last night that were associated with SOB, chest pressure and pre-syncope.  He came to [place] for further evaluation.   Recent cardiac testing includes an echocardiogram on 07-12-13 which demonstrated an EF of 65-70%, no RWMA, grade 2 diastolic dysfunction, mild MR, LA 46, IVS 16.3mm.  Holter monitor 06-2013 demonstrated sinus rhythm with PVC's, no NSVT, and short run of atrial tachycardia.   On arrival to the ER, he began having frequent sustained ventricular tachycardia at a rate of 217bpm.  He was placed on IV amiodarone and Atenolol and was admitted for further evaluation.  He has had no further sustained ventricular arrhythmias since 2AM this morning.  Lab work has been unremarkable this admission.    He is married with 2 grown children.  He quit smoking 30 years ago.  He drinks 3 glasses of red wine a night.  He denies illicit drug use.  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  He has no functional limitations.  His family history is notable for a older brother who "dropped dead" at work - he thinks an autopsy was done and it was determined he died from CAD.  His younger brother also has HCM and has an ICD as well as a VAD currently - he is on the transplant list at [facility] and lives in [place].   He denies chest pain, shortness of breath, palpitations, or frank syncope.  He does complain of "panic attacks" on Tuesday that were accompanied by dizziness, but not palpitations.  He took a Xanax and these resolved.   He had the same type of feeling last night prior to coming to the ER for evaluation.  He reports a longstanding history of orthostatic intolerance.   EP has been asked to evaluate for treatment options.   ROS is negative except as outlined above.   Past Medical History  Diagnosis Date  . Acute cholecystitis 06/21/2011  . GERD (gastroesophageal reflux disease)   . Heart murmur   . Hearing loss   . Bruises easily   . Hypertrophic cardiomyopathy   . Mitral regurgitation     , Moderate  . Hiatal hernia   . Septal defect     LV Septal hyptertrophy 18mm, normal EF, minimal LVOT gradiant.     Surgical History:  Past Surgical History  Procedure Laterality Date  . Laparoscopic cholecystectomy w/ cholangiography  06/23/2011    Dr Streck  . Appendectomy  1950  . Cholecystectomy  06/24/2011  . Gallbladder surgery       Prescriptions prior to admission  Medication Sig Dispense Refill  . aspirin 81 MG tablet Take 81 mg by mouth daily.        . atenolol (TENORMIN) 25 MG tablet Take 25 mg by mouth daily.        . Coenzyme Q10 (CO Q 10) 100 MG CAPS Take by mouth daily.        . glucosamine-chondroitin 500-400 MG tablet Take 1 tablet by mouth daily.      . pantoprazole (PROTONIX) 40 MG tablet Take 40 mg by mouth daily.      .   Probiotic Product (ALIGN) 4 MG CAPS Take 1 capsule by mouth daily.      . Zinc 50 MG CAPS Take 1 capsule by mouth daily.         Inpatient Medications:  . aspirin  81 mg Oral Daily  . atenolol  50 mg Oral Daily  . enoxaparin (LOVENOX) injection  40 mg Subcutaneous Q24H  . [START ON 08/19/2013] pneumococcal 23 valent vaccine  0.5 mL Intramuscular Tomorrow-1000  . zinc sulfate  220 mg Oral Daily    Allergies: No Known Allergies  History   Social History  . Marital Status: Married    Spouse Name: N/A    Number of Children: N/A  . Years of Education: N/A   Occupational History  . Not on file.   Social History Main Topics  . Smoking status: Former Smoker      Quit date: 07/06/1981  . Smokeless tobacco: Never Used  . Alcohol Use: Yes     Comment: 3 glasses per day  . Drug Use: No  . Sexual Activity: Not on file   Other Topics Concern  . Not on file   Social History Narrative  . No narrative on file     Family History  Problem Relation Age of Onset  . Hypertrophic cardiomyopathy Mother   . Pneumonia Father   . Hypertrophic cardiomyopathy Brother   . Other Brother     VAD  . Other Brother     , MI  . Arrhythmia Mother   . Arrhythmia Brother   . Diabetes Brother   . Diabetes Father   . Heart attack Brother   . Heart failure Mother   . Sudden death Brother      Physical Exam: Filed Vitals:   08/18/13 0530 08/18/13 0600 08/18/13 0700 08/18/13 1200  BP: 90/64 111/64    Pulse: 65 73    Temp:   98 F (36.7 C) 97.8 F (36.6 C)  TempSrc:   Oral Oral  Resp: 14 20    Height:      Weight:      SpO2: 97% 100%      GEN- The patient is well appearing, alert and oriented x 3 today.   Head- normocephalic, atraumatic Eyes-  Sclera clear, conjunctiva pink Ears- hearing intact Oropharynx- clear Neck- supple, no JVP Lymph- no cervical lymphadenopathy Lungs- Clear to ausculation bilaterally, normal work of breathing Heart- Regular rate and rhythm, no murmurs, rubs or gallops, PMI not laterally displaced GI- soft, NT, ND, + BS Extremities- no clubbing, cyanosis, or edema MS- no significant deformity or atrophy Skin- no rash or lesion Psych- euthymic mood, full affect Neuro- strength and sensation are intact  Labs:   Lab Results  Component Value Date   WBC 6.7 08/18/2013   HGB 17.2* 08/18/2013   HCT 48.7 08/18/2013   MCV 88.1 08/18/2013   PLT 173 08/18/2013    Recent Labs Lab 08/18/13 0230  NA 138  K 4.1  CL 102  CO2 23  BUN 18  CREATININE 1.13  CALCIUM 9.4  PROT 7.4  BILITOT 0.3  ALKPHOS 90  ALT 44  AST 65*  GLUCOSE 103*    Radiology/Studies: Dg Chest Port 1 View 08/18/2013   CLINICAL DATA:  Chest pain   EXAM: PORTABLE CHEST - 1 VIEW  COMPARISON:  Prior radiograph from 06/21/2011  FINDINGS: Defibrillator pads overlie the thorax. Cardiac and mediastinal silhouettes are grossly stable, and remain within normal limits.  Lungs are mildly hypoinflated. There is   mild diffuse pulmonary vascular congestion without frank pulmonary edema. No focal infiltrate identified. No pleural effusion or pneumothorax.  Osseous structures are unchanged.  IMPRESSION: Mild diffuse pulmonary vascular congestion without frank pulmonary edema. No focal infiltrate identified.   Electronically Signed   By: Benjamin  McClintock M.D.   On: 08/18/2013 02:54    EKG: 08-18-2013 2:22AM - ventricular tachycardia, rate 217 08-18-2013 6:56AM- sinus rhythm with 1st degree AV block, PR 206, otherwise normal intervals, QT not prolonged  TELEMETRY: sinus rhythm with occasional PVC's, no further sustained ventricular arrhythmias since 2:30 this morning.   Assessment and Plan:  1. VT The patient has sustained ventricular tachycardia in the setting of hypertrophic CM.  His VT is a RBB R inferior axis at 217 bpm.  No reversible causes are identified.  He has no ischemic symptoms to warrant cath at this time. At this time, he meets criteria for ICD implantation for secondary prevention of sudden death.  Risks, benefits, alternatives to ICD implantation were discussed in detail with the patient today. The patient  understands that the risks include but are not limited to bleeding, infection, pneumothorax, perforation, tamponade, vascular damage, renal failure, MI, stroke, death, inappropriate shocks, and lead dislodgement and wishes to proceed.  We will therefore schedule device implantation at the next available time. I have instructed him to not drive for 6 months. Stop amiodarone and uptitrate beta blocker as needed.  Given resting bradycardia will place an atrial lead.  2. HCM Continue medical management with atenolol.       

## 2013-08-18 NOTE — ED Provider Notes (Addendum)
CSN: [id number]     Arrival date & time 08/18/13  0206 History   First MD Initiated Contact with Patient 08/18/13 0222     Chief Complaint  Patient presents with  . Dizziness  . Anxiety   (Consider location/radiation/quality/duration/timing/severity/associated sxs/prior Treatment) Patient is a 67 y.o. male presenting with anxiety. The history is provided by the patient.  Anxiety   patient complaining of palpitations that began just prior to arrival. No associated chest pain but did have some chest pressure. No diaphoresis. Does have a history of cardiomyopathy. Patient denies any prior history of tachyarrhythmias. Did have near syncope. No recent fever or chills. No nausea vomiting. Symptoms persistent and nothing makes them better or worse and no treatment used prior to arrival.  Past Medical History  Diagnosis Date  . Acute cholecystitis 06/21/2011  . GERD (gastroesophageal reflux disease)   . Heart murmur   . Hearing loss   . Bruises easily   . Hypertrophic cardiomyopathy   . Mitral regurgitation     , Moderate  . Hiatal hernia   . Septal defect     LV Septal hyptertrophy 18mm, normal EF, minimal LVOT gradiant.   Past Surgical History  Procedure Laterality Date  . Laparoscopic cholecystectomy w/ cholangiography  06/23/2011    Dr Jamey Ripa  . Appendectomy  1950  . Cholecystectomy  06/24/2011  . Gallbladder surgery     Family History  Problem Relation Age of Onset  . Hypertrophic cardiomyopathy Mother   . Pneumonia Father   . Hypertrophic cardiomyopathy Brother   . Other Brother     VAD  . Other Brother     , MI  . Arrhythmia Mother   . Arrhythmia Brother   . Diabetes Brother   . Diabetes Father   . Heart attack Brother   . Heart failure Mother   . Sudden death Brother    History  Substance Use Topics  . Smoking status: Former Smoker    Quit date: 07/06/1981  . Smokeless tobacco: Never Used  . Alcohol Use: Yes     Comment: 3 glasses per day    Review of  Systems  All other systems reviewed and are negative.    Allergies  Review of patient's allergies indicates no known allergies.  Home Medications   Current Outpatient Rx  Name  Route  Sig  Dispense  Refill  . aspirin 81 MG tablet   Oral   Take 81 mg by mouth daily.           Marland Kitchen atenolol (TENORMIN) 25 MG tablet   Oral   Take 25 mg by mouth daily.           . Coenzyme Q10 (CO Q 10) 100 MG CAPS   Oral   Take by mouth daily.           Marland Kitchen glucosamine-chondroitin 500-400 MG tablet   Oral   Take 1 tablet by mouth daily.         . Multiple Vitamins-Minerals (CENTRUM SILVER ULTRA MENS PO)   Oral   Take by mouth daily.           . pantoprazole (PROTONIX) 40 MG tablet   Oral   Take 40 mg by mouth daily.         . Probiotic Product (PROBIOTIC DAILY PO)   Oral   Take 1 tablet by mouth daily.         . Zinc 50 MG CAPS   Oral  Take by mouth daily.            BP 137/96  Pulse 86  Temp(Src) 97.6 F (36.4 C) (Oral)  Resp 16  SpO2 97% Physical Exam  Nursing note and vitals reviewed. Constitutional: He is oriented to person, place, and time. He appears well-developed and well-nourished.  Non-toxic appearance. No distress.  HENT:  Head: Normocephalic and atraumatic.  Eyes: Conjunctivae, EOM and lids are normal. Pupils are equal, round, and reactive to light.  Neck: Normal range of motion. Neck supple. No tracheal deviation present. No mass present.  Cardiovascular: Regular rhythm and normal heart sounds.  Tachycardia present.  Exam reveals no gallop.   No murmur heard. Pulmonary/Chest: Effort normal and breath sounds normal. No stridor. No respiratory distress. He has no decreased breath sounds. He has no wheezes. He has no rhonchi. He has no rales.  Abdominal: Soft. Normal appearance and bowel sounds are normal. He exhibits no distension. There is no tenderness. There is no rebound and no CVA tenderness.  Musculoskeletal: Normal range of motion. He exhibits no  edema and no tenderness.  Neurological: He is alert and oriented to person, place, and time. He has normal strength. No cranial nerve deficit or sensory deficit. GCS eye subscore is 4. GCS verbal subscore is 5. GCS motor subscore is 6.  Skin: Skin is warm and dry. No abrasion and no rash noted.  Psychiatric: He has a normal mood and affect. His speech is normal and behavior is normal.    ED Course  Procedures (including critical care time) Labs Review Labs Reviewed  TROPONIN I  CBC WITH DIFFERENTIAL  COMPREHENSIVE METABOLIC PANEL   Imaging Review No results found.  EKG Interpretation    Date/Time:  Friday August 18 2013 02:11:00 EST Ventricular Rate:  79 PR Interval:  188 QRS Duration: 84 QT Interval:  376 QTC Calculation: 431 R Axis:   82 Text Interpretation:  Normal sinus rhythm Septal infarct , age undetermined Abnormal ECG Confirmed by Baudelio Karnes  MD, Lamondre Wesche ([id number]) on 08/18/2013 2:39:19 AM            MDM  No diagnosis found. Patient originally in sinus rhythm when he presented he then went into a wide complex tachyarrhythmia resembling ventricular tachycardia. Was given amiodarone bolus of 150 mg and started on amiodarone drip at 30 mg per hour. He has remained hemodynamically stable with a good blood pressure. Opted not to cardiovert him at this time. Cardiology consult has been made and they will come to the patient.  3:07 AM Patient is now converted into sinus rhythm. He remains hemodynamically stable.   CRITICAL CARE Performed by: Toy Baker Total critical care time: 45 Critical care time was exclusive of separately billable procedures and treating other patients. Critical care was necessary to treat or prevent imminent or life-threatening deterioration. Critical care was time spent personally by me on the following activities: development of treatment plan with patient and/or surrogate as well as nursing, discussions with consultants, evaluation of  patient's response to treatment, examination of patient, obtaining history from patient or surrogate, ordering and performing treatments and interventions, ordering and review of laboratory studies, ordering and review of radiographic studies, pulse oximetry and re-evaluation of patient's condition.    Toy Baker, MD 08/18/13 1610  Toy Baker, MD 08/18/13 [phone]

## 2013-08-19 ENCOUNTER — Inpatient Hospital Stay (HOSPITAL_COMMUNITY): Payer: Medicare Other

## 2013-08-19 ENCOUNTER — Encounter (HOSPITAL_COMMUNITY): Payer: Self-pay | Admitting: Physician Assistant

## 2013-08-19 DIAGNOSIS — I059 Rheumatic mitral valve disease, unspecified: Secondary | ICD-10-CM | POA: Diagnosis not present

## 2013-08-19 DIAGNOSIS — I421 Obstructive hypertrophic cardiomyopathy: Secondary | ICD-10-CM | POA: Diagnosis not present

## 2013-08-19 DIAGNOSIS — Z4502 Encounter for adjustment and management of automatic implantable cardiac defibrillator: Secondary | ICD-10-CM | POA: Diagnosis not present

## 2013-08-19 DIAGNOSIS — I472 Ventricular tachycardia: Secondary | ICD-10-CM

## 2013-08-19 DIAGNOSIS — K219 Gastro-esophageal reflux disease without esophagitis: Secondary | ICD-10-CM | POA: Diagnosis not present

## 2013-08-19 DIAGNOSIS — I4729 Other ventricular tachycardia: Secondary | ICD-10-CM

## 2013-08-19 LAB — BASIC METABOLIC PANEL
CO2: 25 mEq/L (ref 19–32)
Calcium: 8.7 mg/dL (ref 8.4–10.5)
Chloride: 102 mEq/L (ref 96–112)
GFR calc Af Amer: 74 mL/min — ABNORMAL LOW (ref >90)
GFR calc non Af Amer: 64 mL/min — ABNORMAL LOW (ref >90)
Glucose, Bld: 116 mg/dL — ABNORMAL HIGH (ref 70–99)
Potassium: 4.2 mEq/L (ref 3.5–5.1)
Sodium: 136 mEq/L (ref 135–145)

## 2013-08-19 LAB — MAGNESIUM: Magnesium: 1.8 mg/dL (ref 1.5–2.5)

## 2013-08-19 MED ORDER — ASPIRIN 325 MG PO TABS
650.0000 mg | ORAL_TABLET | Freq: Once | ORAL | Status: AC
  Administered 2013-08-19: 650 mg via ORAL
  Filled 2013-08-19: qty 2

## 2013-08-19 MED ORDER — YOU HAVE A PACEMAKER BOOK
Freq: Once | Status: AC
  Administered 2013-08-19: 11:00:00
  Filled 2013-08-19: qty 1

## 2013-08-19 MED ORDER — ATENOLOL 50 MG PO TABS: 50.0000 mg | ORAL_TABLET | Freq: Every day | ORAL | Status: DC

## 2013-08-19 NOTE — Progress Notes (Signed)
Pt continues to report severe headache despite PRN medications. Pt states "headache is worse after medicine given". MD notified. Pt reports no vision change, neuro assessment documented. MD to come see patient. Will continue to monitor.

## 2013-08-19 NOTE — Progress Notes (Signed)
        Patient Name: Joseph Morrow      SUBJECTIVE: headache last night now gone  Past Medical History  Diagnosis Date  . Acute cholecystitis 06/21/2011  . GERD (gastroesophageal reflux disease)   . Heart murmur   . Hearing loss   . Bruises easily   . Hypertrophic cardiomyopathy   . Mitral regurgitation     , Moderate  . Hiatal hernia   . Septal defect     LV Septal hyptertrophy 18mm, normal EF, minimal LVOT gradiant.    Scheduled Meds:  Scheduled Meds: . aspirin  81 mg Oral Daily  . atenolol  50 mg Oral Daily  . pneumococcal 23 valent vaccine  0.5 mL Intramuscular Tomorrow-1000  . sodium chloride  3 mL Intravenous Q12H  . zinc sulfate  220 mg Oral Daily   Continuous Infusions:   PHYSICAL EXAM Filed Vitals:   08/18/13 2030 08/18/13 2300 08/19/13 0449 08/19/13 0500  BP: 102/61 116/72 116/65 116/65  Pulse: 71   78  Temp:    98.1 F (36.7 C)  TempSrc:    Oral  Resp: 18   18  Height:      Weight:    177 lb (80.287 kg)  SpO2: 100%   100%    Well developed and nourished in no acute distress HENT normal Neck supple with JVP-flat Clear Pocket without hematoma Regular rate and rhythm, no murmurs or gallops Abd-soft with active BS No Clubbing cyanosis edema Skin-warm and dry A & Oriented  Grossly normal sensory and motor function  TELEMETRY: Reviewed telemetry pt in nsr/apacing   Intake/Output Summary (Last 24 hours) at 08/19/13 1148 Last data filed at 08/19/13 0913  Gross per 24 hour  Intake  276.4 ml  Output   1125 ml  Net -848.6 ml    LABS: Basic Metabolic Panel:  Recent Labs Lab 08/18/13 0230 08/19/13 0440  NA 138 136  K 4.1 4.2  CL 102 102  CO2 23 25  GLUCOSE 103* 116*  BUN 18 15  CREATININE 1.13 1.15  CALCIUM 9.4 8.7  MG  --  1.8   Cardiac Enzymes:  Recent Labs  08/18/13 0230  TROPONINI <0.30   CBC:  Recent Labs Lab 08/18/13 0230  WBC 6.7  NEUTROABS 3.2  HGB 17.2*  HCT 48.7  MCV 88.1  PLT 173    PROTIME: No results found for this basename: LABPROT, INR,  in the last 72 hours Liver Function Tests:  Recent Labs  08/18/13 0230  AST 65*  ALT 44  ALKPHOS 90  BILITOT 0.3  PROT 7.4  ALBUMIN 4.0   CXR good lead position Device Interrogation:* normal function  ASSESSMENT AND PLAN:  Active Problems:   Hypertrophic cardiomyopathy   V tach  Device implanted for 2 prevention BB inccreased to atenolol 50 2 children aware of need to screen--no grandchildren Brother under care of [facility] for HCM Discharge instructions reviewed  Signed, Sherryl Manges MD  08/19/2013

## 2013-08-19 NOTE — Progress Notes (Signed)
Cardiology Cross Cover  Called due to pt reported severe headache and "hallucinations". Pt is s/p ICD placement today. No complications from procedure, pt reports doing well post procedure. However, had onset of headache, actually worsened with vicodin. He does endorse "worst headache of his life" but reports that it has improved now somewhat. No other neurologic symptoms. He reports the hallucinations were that when he closed his eyes, he envisioned his head exploding. No resolved. Exam benign including neuro exam, vitals stable. Will hold on CT for now but will consider it if symptoms change or fail to improve. ?Delayed effect from anesthesia?

## 2013-08-28 ENCOUNTER — Ambulatory Visit (INDEPENDENT_AMBULATORY_CARE_PROVIDER_SITE_OTHER): Payer: Medicare Other | Admitting: *Deleted

## 2013-08-28 ENCOUNTER — Encounter: Payer: Self-pay | Admitting: Internal Medicine

## 2013-08-28 ENCOUNTER — Encounter: Payer: Self-pay | Admitting: Cardiology

## 2013-08-28 DIAGNOSIS — I4729 Other ventricular tachycardia: Secondary | ICD-10-CM | POA: Diagnosis not present

## 2013-08-28 DIAGNOSIS — I472 Ventricular tachycardia, unspecified: Secondary | ICD-10-CM

## 2013-08-28 LAB — MDC_IDC_ENUM_SESS_TYPE_INCLINIC
Battery Remaining Longevity: 99.6 mo
Brady Statistic RA Percent Paced: 12 %
Date Time Interrogation Session: 20141215175513
HighPow Impedance: 44.7188
Implantable Pulse Generator Serial Number: 1129139
Lead Channel Impedance Value: 400 Ohm
Lead Channel Impedance Value: 475 Ohm
Lead Channel Pacing Threshold Amplitude: 0.5 V
Lead Channel Pacing Threshold Amplitude: 0.5 V
Lead Channel Pacing Threshold Pulse Width: 0.5 ms
Lead Channel Sensing Intrinsic Amplitude: 12 mV
Lead Channel Setting Pacing Amplitude: 3.5 V
Zone Setting Detection Interval: 250 ms
Zone Setting Detection Interval: 300 ms
Zone Setting Detection Interval: 330 ms

## 2013-08-28 NOTE — Progress Notes (Signed)
Pt seen in device clinic for follow up of recently implanted ICD.  Wound: No redness, swelling, or edema.  Steri-strips removed today. Incision has minor skin overlap with bleeding--redressed with steri-strips & tegaderm.  Device interrogated and found to be functioning normally.  Changed VF NID from 20 to 24. See PaceArt for full details.  Pt denies chest pain, shortness of breath, palpitations, or dizziness.  Wound re-check 09/01/13.  Alise Calais 08/28/2013 5:42 PM

## 2013-09-01 ENCOUNTER — Ambulatory Visit (INDEPENDENT_AMBULATORY_CARE_PROVIDER_SITE_OTHER): Payer: Medicare Other | Admitting: *Deleted

## 2013-09-01 ENCOUNTER — Telehealth: Payer: Self-pay | Admitting: Internal Medicine

## 2013-09-01 DIAGNOSIS — I422 Other hypertrophic cardiomyopathy: Secondary | ICD-10-CM | POA: Diagnosis not present

## 2013-09-01 DIAGNOSIS — I472 Ventricular tachycardia, unspecified: Secondary | ICD-10-CM

## 2013-09-01 DIAGNOSIS — I4729 Other ventricular tachycardia: Secondary | ICD-10-CM | POA: Diagnosis not present

## 2013-09-01 LAB — MDC_IDC_ENUM_SESS_TYPE_INCLINIC
Battery Remaining Longevity: 96 mo
Brady Statistic RA Percent Paced: 15 %
Lead Channel Impedance Value: 350 Ohm
Lead Channel Impedance Value: 475 Ohm
Lead Channel Pacing Threshold Amplitude: 0.5 V
Lead Channel Pacing Threshold Amplitude: 1 V
Lead Channel Pacing Threshold Pulse Width: 0.5 ms
Lead Channel Pacing Threshold Pulse Width: 0.5 ms
Lead Channel Setting Pacing Amplitude: 3.5 V
Lead Channel Setting Pacing Pulse Width: 0.5 ms
Zone Setting Detection Interval: 250 ms
Zone Setting Detection Interval: 330 ms

## 2013-09-01 NOTE — Telephone Encounter (Signed)
New Problem:  Pt is calling to see if his remote device is set up properly. Pt would like a call back.

## 2013-09-01 NOTE — Progress Notes (Signed)

## 2013-09-04 ENCOUNTER — Ambulatory Visit (INDEPENDENT_AMBULATORY_CARE_PROVIDER_SITE_OTHER): Payer: Medicare Other | Admitting: Cardiology

## 2013-09-04 ENCOUNTER — Encounter: Payer: Self-pay | Admitting: Cardiology

## 2013-09-04 VITALS — BP 110/74 | HR 54 | Ht 70.0 in | Wt 175.1 lb

## 2013-09-04 DIAGNOSIS — Z95810 Presence of automatic (implantable) cardiac defibrillator: Secondary | ICD-10-CM

## 2013-09-04 DIAGNOSIS — I472 Ventricular tachycardia, unspecified: Secondary | ICD-10-CM

## 2013-09-04 DIAGNOSIS — I059 Rheumatic mitral valve disease, unspecified: Secondary | ICD-10-CM

## 2013-09-04 DIAGNOSIS — I422 Other hypertrophic cardiomyopathy: Secondary | ICD-10-CM

## 2013-09-04 DIAGNOSIS — I4729 Other ventricular tachycardia: Secondary | ICD-10-CM

## 2013-09-04 DIAGNOSIS — I340 Nonrheumatic mitral (valve) insufficiency: Secondary | ICD-10-CM

## 2013-09-04 NOTE — Patient Instructions (Addendum)
  Your physician recommends that you continue on your current medications as directed. Please refer to the Current Medication list given to you today.  Your physician wants you to follow-up in: 6 months with Dr. Anne Fu. You will receive a reminder letter in the mail two months in advance. If you don't receive a letter, please call our office to schedule the follow-up appointment.        Supplemental Discharge Instructions for  Pacemaker/Defibrillator Patients  Activity No heavy lifting or vigorous activity with your left/right arm for 6 to 8 weeks.  Do not raise your left/right arm above your head for one week.  Gradually raise your affected arm as drawn below.           __  NO DRIVING for     ; you may begin driving on     .  WOUND CARE   Keep the wound area clean and dry.  Do not get this area wet for one week. No showers for one week; you may shower on     .   The tape/steri-strips on your wound will fall off; do not pull them off.  No bandage is needed on the site.  DO  NOT apply any creams, oils, or ointments to the wound area.   If you notice any drainage or discharge from the wound, any swelling or bruising at the site, or you develop a fever > 101? F after you are discharged home, call the office at once.  Special Instructions   You are still able to use cellular telephones; use the ear opposite the side where you have your pacemaker/defibrillator.  Avoid carrying your cellular phone near your device.   When traveling through airports, show security personnel your identification card to avoid being screened in the metal detectors.  Ask the security personnel to use the hand wand.   Avoid arc welding equipment, MRI testing (magnetic resonance imaging), TENS units (transcutaneous nerve stimulators).  Call the office for questions about other devices.   Avoid electrical appliances that are in poor condition or are not properly grounded.   Microwave ovens are safe to be near or to  operate.  Additional information for defibrillator patients should your device go off:   If your device goes off ONCE and you feel fine afterward, notify the device clinic nurses.   If your device goes off ONCE and you do not feel well afterward, call 911.   If your device goes off TWICE, call 911.   If your device goes off THREE times in one day, call 911.  DO NOT DRIVE YOURSELF OR A FAMILY MEMBER WITH A DEFIBRILLATOR TO THE HOSPITAL-CALL 911

## 2013-09-04 NOTE — Telephone Encounter (Signed)
Transmission received, patient aware. 

## 2013-09-04 NOTE — Progress Notes (Signed)
[address] [place], [place]  [address] Phone: [phone] Fax:  [phone]  Date:  09/04/2013   ID:  Joseph Morrow, DOB [birth year], MRN [id number]  PCP:  Kari Baars, MD   History of Present Illness: Joseph Morrow is a 67 y.o. male who had ICD placed secondary to ventricular tachycardia heart rate of 210 beats per minute in the setting of hypertrophic cardiomyopathy with previous benign workup/evaluation including treadmill, monitoring. Had been on beta blocker. Overall he is doing well. No chest pain, no syncope. He did not have any syncope prior to defibrillator placement as well. Dizziness. Wife present for discussion.   Wt Readings from Last 3 Encounters:  09/04/13 175 lb 1.9 oz (79.434 kg)  08/19/13 177 lb (80.287 kg)  08/19/13 177 lb (80.287 kg)     Past Medical History  Diagnosis Date  . Acute cholecystitis 06/21/2011  . GERD (gastroesophageal reflux disease)   . Heart murmur   . Hearing loss   . Bruises easily   . Hypertrophic cardiomyopathy   . Mitral regurgitation     , Moderate  . Hiatal hernia   . Septal defect     LV Septal hyptertrophy 18mm, normal EF, minimal LVOT gradiant.  . VT (ventricular tachycardia) 08/18/2013    s/p St. Jude ICD    Past Surgical History  Procedure Laterality Date  . Laparoscopic cholecystectomy w/ cholangiography  06/23/2011    Dr Jamey Ripa  . Appendectomy  1950  . Cholecystectomy  06/24/2011  . Gallbladder surgery    . Implantable cardioverter defibrillator implant  08/18/2013    St. Jude ICD, serial # [id number]    Current Outpatient Prescriptions  Medication Sig Dispense Refill  . aspirin 81 MG tablet Take 81 mg by mouth daily.        Marland Kitchen atenolol (TENORMIN) 50 MG tablet Take 1 tablet (50 mg total) by mouth daily.  30 tablet  3  . Coenzyme Q10 (CO Q 10) 100 MG CAPS Take by mouth daily.        Marland Kitchen glucosamine-chondroitin 500-400 MG tablet Take 1 tablet by mouth daily.      . pantoprazole  (PROTONIX) 40 MG tablet Take 40 mg by mouth daily.      . Probiotic Product (ALIGN) 4 MG CAPS Take 1 capsule by mouth daily.      . Zinc 50 MG CAPS Take 1 capsule by mouth daily.        No current facility-administered medications for this visit.    Allergies:   No Known Allergies  Social History:  The patient  reports that he quit smoking about 32 years ago. He has never used smokeless tobacco. He reports that he drinks alcohol. He reports that he does not use illicit drugs.   ROS:  Please see the history of present illness.   Denies any syncope, bleeding, orthopnea, PND  PHYSICAL EXAM: VS:  BP 110/74  Pulse 54  Ht 5\' 10"  (1.778 m)  Wt 175 lb 1.9 oz (79.434 kg)  BMI 25.13 kg/m2 Well nourished, well developed, in no acute distress HEENT: normal Neck: no JVD Cardiac:  normal S1, S2; RRR; no murmur Lungs:  clear to auscultation bilaterally, no wheezing, rhonchi or rales Abd: soft, nontender, no hepatomegaly Ext: no edema Skin: warm and dry Neuro: no focal abnormalities noted  EKG:  Sinus bradycardia rate 54, no pacing, normal     ASSESSMENT AND PLAN:  1. Ventricular tachycardia-status post ICD, St. Jude  early December 2014. Ventricular tachycardia heart rate of 210 initiated hospital stay. EP recommended increasing atenolol. Defibrillator. Wound check 2 days ago excellent. Answered questions. Asked him to inquire about further restrictions/limitations with EP. I discussed with EP nursing today, handout given. On our new discharge instruction sheet it says 1 week restriction for lifting arm above head and then after that gradually increasing according to stick figure diagrams. 2. Hypertrophic cardiomyopathy-beta blocker, defibrillator, brother being seen at [facility]. 3. Mild MR stable 4. We will see back in 6 months.  Signed, Donato Schultz, MD [facility]  09/04/2013 11:09 AM

## 2013-09-11 ENCOUNTER — Telehealth: Payer: Self-pay | Admitting: Cardiology

## 2013-09-11 NOTE — Telephone Encounter (Signed)
New problem    Pt would like his MY CHART access code e mail and faxed to him please.

## 2013-09-12 DIAGNOSIS — I4729 Other ventricular tachycardia: Secondary | ICD-10-CM | POA: Diagnosis not present

## 2013-09-12 DIAGNOSIS — I472 Ventricular tachycardia: Secondary | ICD-10-CM | POA: Diagnosis not present

## 2013-09-12 DIAGNOSIS — R141 Gas pain: Secondary | ICD-10-CM | POA: Diagnosis not present

## 2013-09-12 DIAGNOSIS — H6120 Impacted cerumen, unspecified ear: Secondary | ICD-10-CM | POA: Diagnosis not present

## 2013-09-12 DIAGNOSIS — Z6825 Body mass index (BMI) 25.0-25.9, adult: Secondary | ICD-10-CM | POA: Diagnosis not present

## 2013-09-19 ENCOUNTER — Telehealth: Payer: Self-pay | Admitting: Internal Medicine

## 2013-09-19 NOTE — Telephone Encounter (Signed)
New problem   Need abn form for pt to sign so there office can start test. Please call.

## 2013-09-19 NOTE — Telephone Encounter (Signed)
Left message for [facility] to return my call

## 2013-09-19 NOTE — Telephone Encounter (Signed)
Will mail patient form and have him mail to [facility]

## 2013-09-21 ENCOUNTER — Encounter: Payer: Self-pay | Admitting: Cardiology

## 2013-09-21 NOTE — Telephone Encounter (Signed)
Activation code mailed to patient.

## 2013-09-25 DIAGNOSIS — L570 Actinic keratosis: Secondary | ICD-10-CM | POA: Diagnosis not present

## 2013-09-25 DIAGNOSIS — D1801 Hemangioma of skin and subcutaneous tissue: Secondary | ICD-10-CM | POA: Diagnosis not present

## 2013-09-25 DIAGNOSIS — L821 Other seborrheic keratosis: Secondary | ICD-10-CM | POA: Diagnosis not present

## 2013-10-10 ENCOUNTER — Encounter: Payer: Self-pay | Admitting: Internal Medicine

## 2013-10-18 ENCOUNTER — Telehealth: Payer: Self-pay | Admitting: Internal Medicine

## 2013-10-18 NOTE — Telephone Encounter (Signed)
New message     Pt has a defib----talk to someone about traveling out of state

## 2013-10-18 NOTE — Telephone Encounter (Signed)
Spoke w/pt and answered all questions regarding device/kwm

## 2013-10-25 ENCOUNTER — Encounter: Payer: Self-pay | Admitting: Internal Medicine

## 2013-11-03 ENCOUNTER — Encounter: Payer: Self-pay | Admitting: Internal Medicine

## 2013-11-13 ENCOUNTER — Encounter: Payer: Self-pay | Admitting: Internal Medicine

## 2013-11-23 ENCOUNTER — Ambulatory Visit (INDEPENDENT_AMBULATORY_CARE_PROVIDER_SITE_OTHER): Payer: Medicare Other | Admitting: Internal Medicine

## 2013-11-23 ENCOUNTER — Encounter: Payer: Self-pay | Admitting: Internal Medicine

## 2013-11-23 ENCOUNTER — Telehealth: Payer: Self-pay | Admitting: Internal Medicine

## 2013-11-23 VITALS — BP 116/68 | HR 58 | Ht 70.0 in | Wt 182.0 lb

## 2013-11-23 DIAGNOSIS — I4729 Other ventricular tachycardia: Secondary | ICD-10-CM

## 2013-11-23 DIAGNOSIS — I472 Ventricular tachycardia, unspecified: Secondary | ICD-10-CM

## 2013-11-23 DIAGNOSIS — I422 Other hypertrophic cardiomyopathy: Secondary | ICD-10-CM

## 2013-11-23 DIAGNOSIS — I059 Rheumatic mitral valve disease, unspecified: Secondary | ICD-10-CM

## 2013-11-23 DIAGNOSIS — I340 Nonrheumatic mitral (valve) insufficiency: Secondary | ICD-10-CM

## 2013-11-23 DIAGNOSIS — Z95810 Presence of automatic (implantable) cardiac defibrillator: Secondary | ICD-10-CM

## 2013-11-23 NOTE — Patient Instructions (Signed)
Your physician wants you to follow-up in: 12 months with Dr Vallery Ridge will receive a reminder letter in the mail two months in advance. If you don't receive a letter, please call our office to schedule the follow-up appointment.    Remote monitoring is used to monitor your Pacemaker or ICD from home. This monitoring reduces the number of office visits required to check your device to one time per year. It allows [place] to keep an eye on the functioning of your device to ensure it is working properly. You are scheduled for a device check from home on 02/22/14. You may send your transmission at any time that day. If you have a wireless device, the transmission will be sent automatically. After your physician reviews your transmission, you will receive a postcard with your next transmission date.

## 2013-11-23 NOTE — Telephone Encounter (Signed)
Not going to do the test at this point.

## 2013-11-23 NOTE — Progress Notes (Signed)
  PCP: Marton Redwood, MD Primary Cardiologist: Vin Yonke is a 68 y.o. male who presents today for routine electrophysiology followup.  Since ICD implant 08/2013 he reports doing very well. He has decided not to pursue genetic testing due to costs.  He has had no further arrhythmia.  Today, he denies symptoms of palpitations, chest pain, shortness of breath,  lower extremity edema, dizziness, presyncope, syncope, or ICD shocks.  The patient is otherwise without complaint today.   Past Medical History  Diagnosis Date  . Acute cholecystitis 06/21/2011  . GERD (gastroesophageal reflux disease)   . Heart murmur   . Hearing loss   . Bruises easily   . Hypertrophic cardiomyopathy   . Mitral regurgitation     Moderate  . Hiatal hernia   . Septal defect     LV Septal hyptertrophy 74mm, normal EF, minimal LVOT gradiant.  . VT (ventricular tachycardia) 08/18/2013    s/p St. Jude ICD   Past Surgical History  Procedure Laterality Date  . Laparoscopic cholecystectomy w/ cholangiography  06/23/2011    Dr Margot Chimes  . Appendectomy  1950  . Cholecystectomy  06/24/2011  . Gallbladder surgery    . Implantable cardioverter defibrillator implant  08/18/2013    St. Jude ICD, serial # [id number]    Current Outpatient Prescriptions  Medication Sig Dispense Refill  . aspirin 81 MG tablet Take 81 mg by mouth daily.        Marland Kitchen atenolol (TENORMIN) 50 MG tablet Take 1 tablet (50 mg total) by mouth daily.  30 tablet  3  . Coenzyme Q10 (CO Q 10) 100 MG CAPS Take 1 capsule by mouth daily.       Marland Kitchen glucosamine-chondroitin 500-400 MG tablet Take 1 tablet by mouth daily.      . pantoprazole (PROTONIX) 40 MG tablet Take 40 mg by mouth daily.      . Probiotic Product (ALIGN) 4 MG CAPS Take 1 capsule by mouth daily.      . Zinc 50 MG CAPS Take 1 capsule by mouth daily.        No current facility-administered medications for this visit.    Physical Exam: Filed Vitals:   11/23/13 1112  BP: 116/68   Pulse: 58  Height: 5\' 10"  (1.778 m)  Weight: 182 lb (82.555 kg)    GEN- The patient is well appearing, alert and oriented x 3 today.   Head- normocephalic, atraumatic Eyes-  Sclera clear, conjunctiva pink Ears- hearing intact Oropharynx- clear Lungs- Clear to ausculation bilaterally, normal work of breathing Chest- ICD pocket is well healed Heart- Regular rate and rhythm, no murmurs, rubs or gallops, PMI not laterally displaced GI- soft, NT, ND, + BS Extremities- no clubbing, cyanosis, or edema  ICD interrogation- reviewed in detail today,  See PACEART report ekg today reveas sinus rhythm 58 bpm, PR 206, QTc 408,  septal infarct  Assessment and Plan:  1.  HCM/ Moderate MR Doing well at this time, without symptoms I am currently in the process of trying to obtain genetic testing for his son  2. VT Normal ICD function See Pace Art report No changes today  Merlin Return in 1 year Follow-up with Dr Marlou Porch as scheduled

## 2013-11-23 NOTE — Telephone Encounter (Signed)
New Message:  Inpida from [facility] states she is calling to speak to [place]. She is requesting an update on the pt.Marland KitchenMarland Kitchen

## 2013-11-26 LAB — MDC_IDC_ENUM_SESS_TYPE_INCLINIC
Brady Statistic RA Percent Paced: 21 %
Brady Statistic RV Percent Paced: 1 %
HighPow Impedance: 46 Ohm
Implantable Pulse Generator Serial Number: 1129139
Lead Channel Impedance Value: 410 Ohm
Lead Channel Pacing Threshold Amplitude: 0.5 V
Lead Channel Pacing Threshold Pulse Width: 0.5 ms
Lead Channel Pacing Threshold Pulse Width: 0.5 ms
Lead Channel Setting Pacing Amplitude: 2.5 V
Lead Channel Setting Pacing Pulse Width: 0.5 ms
MDC IDC MSMT LEADCHNL RA PACING THRESHOLD AMPLITUDE: 1 V
MDC IDC MSMT LEADCHNL RA SENSING INTR AMPL: 4.5 mV
MDC IDC MSMT LEADCHNL RV IMPEDANCE VALUE: 600 Ohm
MDC IDC MSMT LEADCHNL RV SENSING INTR AMPL: 11.8 mV
MDC IDC SET LEADCHNL RA PACING AMPLITUDE: 2 V
MDC IDC SET LEADCHNL RV SENSING SENSITIVITY: 0.5 mV
Zone Setting Detection Interval: 250 ms
Zone Setting Detection Interval: 300 ms
Zone Setting Detection Interval: 330 ms

## 2013-12-06 ENCOUNTER — Telehealth: Payer: Self-pay | Admitting: Internal Medicine

## 2013-12-06 NOTE — Telephone Encounter (Signed)
Left message that not going to proceed at this time.

## 2013-12-06 NOTE — Telephone Encounter (Signed)
New message     What did we decide to do regarding genetic testing?

## 2013-12-13 ENCOUNTER — Encounter: Payer: Self-pay | Admitting: Cardiology

## 2013-12-14 ENCOUNTER — Encounter: Payer: Self-pay | Admitting: Internal Medicine

## 2013-12-14 ENCOUNTER — Other Ambulatory Visit: Payer: Self-pay | Admitting: Internal Medicine

## 2013-12-28 ENCOUNTER — Encounter: Payer: Self-pay | Admitting: Cardiology

## 2014-01-02 ENCOUNTER — Other Ambulatory Visit (HOSPITAL_COMMUNITY): Payer: Self-pay | Admitting: Physician Assistant

## 2014-01-02 ENCOUNTER — Other Ambulatory Visit (HOSPITAL_COMMUNITY): Payer: Self-pay | Admitting: Internal Medicine

## 2014-01-10 ENCOUNTER — Encounter: Payer: Self-pay | Admitting: Cardiology

## 2014-01-24 ENCOUNTER — Telehealth: Payer: Self-pay | Admitting: Internal Medicine

## 2014-01-24 NOTE — Telephone Encounter (Signed)
New problem   Pt has questions concerning his device and want to talk to someone. Please call pt.

## 2014-01-24 NOTE — Telephone Encounter (Signed)
Pt has intermittent pounding in chest while sleeping. Had pt send manual transmission. Transmission shows PVCs 1.9% of time since 11/23/13. No alerts or episodes. I explained the etiology & symptoms of PVCs. Also explained multiple PVCs are only of concern if occurring in a prolonged manner. Pt expressed gratitude & comprehension.

## 2014-01-30 DIAGNOSIS — R1013 Epigastric pain: Secondary | ICD-10-CM | POA: Diagnosis not present

## 2014-01-30 DIAGNOSIS — Z6826 Body mass index (BMI) 26.0-26.9, adult: Secondary | ICD-10-CM | POA: Diagnosis not present

## 2014-01-30 DIAGNOSIS — K219 Gastro-esophageal reflux disease without esophagitis: Secondary | ICD-10-CM | POA: Diagnosis not present

## 2014-01-30 DIAGNOSIS — I421 Obstructive hypertrophic cardiomyopathy: Secondary | ICD-10-CM | POA: Diagnosis not present

## 2014-02-14 DIAGNOSIS — H903 Sensorineural hearing loss, bilateral: Secondary | ICD-10-CM | POA: Diagnosis not present

## 2014-02-22 ENCOUNTER — Ambulatory Visit (INDEPENDENT_AMBULATORY_CARE_PROVIDER_SITE_OTHER): Payer: Medicare Other | Admitting: *Deleted

## 2014-02-22 DIAGNOSIS — I472 Ventricular tachycardia, unspecified: Secondary | ICD-10-CM

## 2014-02-22 DIAGNOSIS — I4729 Other ventricular tachycardia: Secondary | ICD-10-CM

## 2014-02-22 DIAGNOSIS — I422 Other hypertrophic cardiomyopathy: Secondary | ICD-10-CM | POA: Diagnosis not present

## 2014-02-22 NOTE — Progress Notes (Signed)
Remote ICD transmission.   

## 2014-03-06 LAB — MDC_IDC_ENUM_SESS_TYPE_REMOTE
Battery Remaining Percentage: 91 %
Brady Statistic RA Percent Paced: 22 %
Brady Statistic RV Percent Paced: 1 % — CL
HighPow Impedance: 53 Ohm
Lead Channel Sensing Intrinsic Amplitude: 11.8 mV
Lead Channel Sensing Intrinsic Amplitude: 4.6 mV
Lead Channel Setting Pacing Amplitude: 2 V
Lead Channel Setting Pacing Amplitude: 2.5 V
Lead Channel Setting Pacing Pulse Width: 0.5 ms
Lead Channel Setting Sensing Sensitivity: 0.5 mV
MDC IDC MSMT LEADCHNL RA IMPEDANCE VALUE: 460 Ohm
MDC IDC MSMT LEADCHNL RV IMPEDANCE VALUE: 590 Ohm
MDC IDC PG SERIAL: 1129139
Zone Setting Detection Interval: 250 ms
Zone Setting Detection Interval: 300 ms
Zone Setting Detection Interval: 330 ms

## 2014-03-08 ENCOUNTER — Encounter: Payer: Self-pay | Admitting: Internal Medicine

## 2014-03-13 ENCOUNTER — Encounter: Payer: Self-pay | Admitting: Cardiology

## 2014-03-20 ENCOUNTER — Encounter: Payer: Self-pay | Admitting: Internal Medicine

## 2014-04-24 DIAGNOSIS — H5032 Intermittent alternating esotropia: Secondary | ICD-10-CM | POA: Diagnosis not present

## 2014-04-24 DIAGNOSIS — H532 Diplopia: Secondary | ICD-10-CM | POA: Diagnosis not present

## 2014-05-14 ENCOUNTER — Ambulatory Visit (INDEPENDENT_AMBULATORY_CARE_PROVIDER_SITE_OTHER): Payer: Medicare Other | Admitting: Internal Medicine

## 2014-05-14 ENCOUNTER — Encounter: Payer: Self-pay | Admitting: Internal Medicine

## 2014-05-14 VITALS — BP 118/68 | HR 84 | Ht 70.0 in | Wt 186.0 lb

## 2014-05-14 DIAGNOSIS — Z860100 Personal history of colon polyps, unspecified: Secondary | ICD-10-CM

## 2014-05-14 DIAGNOSIS — R1013 Epigastric pain: Secondary | ICD-10-CM

## 2014-05-14 DIAGNOSIS — K219 Gastro-esophageal reflux disease without esophagitis: Secondary | ICD-10-CM

## 2014-05-14 MED ORDER — PEG-KCL-NACL-NASULF-NA ASC-C 100 G PO SOLR: 1.0000 | Freq: Once | ORAL | Status: DC

## 2014-05-14 NOTE — Progress Notes (Signed)
Patient ID: Joseph Morrow, male   DOB: [birth year], 68 y.o.   MRN: [id number] HPI: Joseph Morrow is a 68 year old male with a past medical history of hypertrophic cardiomyopathy status post pacemaker/ICD placement, GERD, colon polyps who is seen in consultation at the request of Dr. Brigitte Pulse to evaluate epigastric abdominal pain and for repeat colonoscopy given history of polyps. He is here alone today. He reports that several months ago he developed an episode of severe epigastric abdominal pain and several days of abdominal pain worse after eating. He remembers this starting after he drank a large [employer] coffee. Dr. Brigitte Pulse initially increased his pantoprazole to twice daily, and eventually the patient added Nexium 20 mg OTC each morning and pantoprazole 40 mg at bedtime. He reports Nexium has always worked better for his heartburn and dyspeptic symptoms but was stopped some years ago due to the expense. Since he's been on this new regimen of PPI, he's had no further epigastric pain. Heartburn has been very well controlled without dysphagia or odynophagia. No nausea or vomiting. No melena. No weight loss. No early satiety. No hepatobiliary complaints. He reports normal bowel movements without blood in his stool or melena. He does recall history of colon polyps and has had 2 or 3 colonoscopies in his lifetime. The last was 2009 performed in [place]. This record is reviewed and 2 hyperplastic polyps were removed from the left colon. He does feel that colon polyps were removed prior to that colonoscopy but is unsure of which type. He is not taking a blood thinner and he denies heart failure symptoms including dyspnea, chest pain, or lower extremity edema.  Past Medical History  Diagnosis Date  . Acute cholecystitis 06/21/2011  . GERD (gastroesophageal reflux disease)   . Heart murmur   . Hearing loss   . Bruises easily   . Hypertrophic cardiomyopathy   . Mitral regurgitation     Moderate  . Hiatal  hernia   . Septal defect     LV Septal hyptertrophy 87mm, normal EF, minimal LVOT gradiant.  . VT (ventricular tachycardia) 08/18/2013    s/p St. Jude ICD    Past Surgical History  Procedure Laterality Date  . Laparoscopic cholecystectomy w/ cholangiography  06/23/2011    Dr Margot Chimes  . Appendectomy  1950  . Cholecystectomy  06/24/2011  . Gallbladder surgery    . Implantable cardioverter defibrillator implant  08/18/2013    St. Jude ICD, serial # [id number]    Outpatient Prescriptions Prior to Visit  Medication Sig Dispense Refill  . aspirin 81 MG tablet Take 81 mg by mouth daily.        Marland Kitchen atenolol (TENORMIN) 50 MG tablet TAKE 1 TABLET BY MOUTH EVERY DAY  90 tablet  1  . Coenzyme Q10 (CO Q 10) 100 MG CAPS Take 1 capsule by mouth daily.       Marland Kitchen FOLIC ACID PO Take by mouth.      Marland Kitchen glucosamine-chondroitin 500-400 MG tablet Take 1 tablet by mouth daily.      . pantoprazole (PROTONIX) 40 MG tablet Take 40 mg by mouth at bedtime.       . Probiotic Product (ALIGN) 4 MG CAPS Take 1 capsule by mouth daily.      . Zinc 50 MG CAPS Take 1 capsule by mouth daily.       . tadalafil (CIALIS) 20 MG tablet Take 20 mg by mouth daily as needed for erectile dysfunction.       No facility-administered medications prior  to visit.    No Known Allergies  Family History  Problem Relation Age of Onset  . Hypertrophic cardiomyopathy Mother   . Pneumonia Father   . Hypertrophic cardiomyopathy Brother   . Other Brother     VAD  . Other Brother     , MI  . Arrhythmia Mother   . Arrhythmia Brother   . Diabetes Brother   . Diabetes Father   . Heart attack Brother   . Heart failure Mother   . Sudden death Brother   . Colon polyps Father     History  Substance Use Topics  . Smoking status: Former Smoker    Quit date: 07/06/1981  . Smokeless tobacco: Never Used  . Alcohol Use: Yes     Comment: 3 glasses per day    ROS: As per history of present illness, otherwise negative  BP 118/68  Pulse  84  Ht 5\' 10"  (1.778 m)  Wt 186 lb (84.369 kg)  BMI 26.69 kg/m2 Constitutional: Well-developed and well-nourished. No distress. HEENT: Normocephalic and atraumatic. Oropharynx is clear and moist. No oropharyngeal exudate. Conjunctivae are normal.  No scleral icterus. Neck: Neck supple. Trachea midline. Cardiovascular: Normal rate, regular rhythm and intact distal pulses. No M/R/G, pacemaker palpable left upper chest Pulmonary/chest: Effort normal and breath sounds normal. No wheezing, rales or rhonchi. Abdominal: Soft, nontender, nondistended. Bowel sounds active throughout. There are no masses palpable. No hepatosplenomegaly. Extremities: no clubbing, cyanosis, or edema Neurological: Alert and oriented to person place and time. Skin: Skin is warm and dry. No rashes noted. Psychiatric: Normal mood and affect. Behavior is normal.  RELEVANT LABS AND IMAGING: CBC    Component Value Date/Time   WBC 6.7 08/18/2013 0230   RBC 5.53 08/18/2013 0230   HGB 17.2* 08/18/2013 0230   HCT 48.7 08/18/2013 0230   PLT 173 08/18/2013 0230   MCV 88.1 08/18/2013 0230   MCH 31.1 08/18/2013 0230   MCHC 35.3 08/18/2013 0230   RDW 13.2 08/18/2013 0230   LYMPHSABS 2.7 08/18/2013 0230   MONOABS 0.7 08/18/2013 0230   EOSABS 0.2 08/18/2013 0230   BASOSABS 0.0 08/18/2013 0230    CMP     Component Value Date/Time   NA 136 08/19/2013 0440   K 4.2 08/19/2013 0440   CL 102 08/19/2013 0440   CO2 25 08/19/2013 0440   GLUCOSE 116* 08/19/2013 0440   BUN 15 08/19/2013 0440   CREATININE 1.15 08/19/2013 0440   CALCIUM 8.7 08/19/2013 0440   PROT 7.4 08/18/2013 0230   ALBUMIN 4.0 08/18/2013 0230   AST 65* 08/18/2013 0230   ALT 44 08/18/2013 0230   ALKPHOS 90 08/18/2013 0230   BILITOT 0.3 08/18/2013 0230   GFRNONAA 64* 08/19/2013 0440   GFRAA 74* 08/19/2013 0440    ASSESSMENT/PLAN: 68 year old male with a past medical history of hypertrophic cardiomyopathy status post pacemaker/ICD placement, GERD, colon polyps who is seen in  consultation at the request of Dr. Brigitte Pulse to evaluate epigastric abdominal pain and for repeat colonoscopy given history of polyps.  1. Epigastric pain, now resolved/long-standing GERD -- his epigastric discomfort has resolved and raises the question of gastritis which is now improved with PPI. He reports better response to Nexium and given that it is now generic I will switch him to generic Nexium 40 mg once daily before breakfast. Given his long-standing GERD, I recommended upper endoscopy to exclude Barrett's esophagus. We discussed the test today including the risks and benefits and he is agreeable to proceed. He  feels that he may have had an upper endoscopy done greater than 10 years ago but is unsure of the results.  2. History of colon polyps -- last colonoscopy 2009 hyperplastic polyps only. He feels he did have previous colonoscopies with additional polyps removed prior to 2009. There is uncertainty regarding his history of adenomatous polyps. We discussed this at length today including awaiting 10 years for his next colonoscopy. After this discussion he wishes to proceed with colonoscopy at this time. We discussed the risks and benefits and he is agreeable to proceed. This can be performed on the same day as upper endoscopy.  Followup as needed and depending on outcome of procedures as discussed above

## 2014-05-14 NOTE — Patient Instructions (Signed)
You have been scheduled for a colonoscopy. Please follow written instructions given to you at your visit today.  Please pick up your prep kit at the pharmacy within the next 1-3 days. If you use inhalers (even only as needed), please bring them with you on the day of your procedure. Your physician has requested that you go to www.startemmi.com and enter the access code given to you at your visit today. This web site gives a general overview about your procedure. However, you should still follow specific instructions given to you by our office regarding your preparation for the procedure.  We have sent Moviprep in to your pharmacy

## 2014-05-25 ENCOUNTER — Telehealth: Payer: Self-pay | Admitting: Internal Medicine

## 2014-05-25 MED ORDER — ESOMEPRAZOLE MAGNESIUM 40 MG PO CPDR: 40.0000 mg | DELAYED_RELEASE_CAPSULE | Freq: Every day | ORAL | Status: DC

## 2014-05-25 NOTE — Telephone Encounter (Signed)
Prescription for generic Nexium sent to patient's pharmacy.

## 2014-05-28 ENCOUNTER — Ambulatory Visit (INDEPENDENT_AMBULATORY_CARE_PROVIDER_SITE_OTHER): Payer: Medicare Other | Admitting: *Deleted

## 2014-05-28 DIAGNOSIS — I472 Ventricular tachycardia, unspecified: Secondary | ICD-10-CM

## 2014-05-28 DIAGNOSIS — Z0000 Encounter for general adult medical examination without abnormal findings: Secondary | ICD-10-CM | POA: Diagnosis not present

## 2014-05-28 DIAGNOSIS — R7301 Impaired fasting glucose: Secondary | ICD-10-CM | POA: Diagnosis not present

## 2014-05-28 DIAGNOSIS — I4729 Other ventricular tachycardia: Secondary | ICD-10-CM

## 2014-05-28 DIAGNOSIS — Z125 Encounter for screening for malignant neoplasm of prostate: Secondary | ICD-10-CM | POA: Diagnosis not present

## 2014-05-28 DIAGNOSIS — I422 Other hypertrophic cardiomyopathy: Secondary | ICD-10-CM | POA: Diagnosis not present

## 2014-05-28 LAB — MDC_IDC_ENUM_SESS_TYPE_REMOTE
Battery Remaining Longevity: 91 mo
Battery Voltage: 3.1 V
Brady Statistic AP VS Percent: 21 %
Brady Statistic AS VS Percent: 76 %
Brady Statistic RA Percent Paced: 20 %
Date Time Interrogation Session: 20150914072211
HIGH POWER IMPEDANCE MEASURED VALUE: 49 Ohm
HighPow Impedance: 49 Ohm
Lead Channel Impedance Value: 440 Ohm
Lead Channel Impedance Value: 560 Ohm
Lead Channel Pacing Threshold Amplitude: 0.75 V
Lead Channel Pacing Threshold Pulse Width: 0.5 ms
Lead Channel Sensing Intrinsic Amplitude: 11.8 mV
Lead Channel Setting Pacing Amplitude: 2.5 V
Lead Channel Setting Pacing Pulse Width: 0.5 ms
Lead Channel Setting Sensing Sensitivity: 0.5 mV
MDC IDC MSMT BATTERY REMAINING PERCENTAGE: 88 %
MDC IDC MSMT LEADCHNL RA SENSING INTR AMPL: 5 mV
MDC IDC MSMT LEADCHNL RV PACING THRESHOLD AMPLITUDE: 0.75 V
MDC IDC MSMT LEADCHNL RV PACING THRESHOLD PULSEWIDTH: 0.5 ms
MDC IDC PG SERIAL: 1129139
MDC IDC SET LEADCHNL RA PACING AMPLITUDE: 2 V
MDC IDC SET ZONE DETECTION INTERVAL: 300 ms
MDC IDC SET ZONE DETECTION INTERVAL: 330 ms
MDC IDC STAT BRADY AP VP PERCENT: 1 %
MDC IDC STAT BRADY AS VP PERCENT: 1 %
MDC IDC STAT BRADY RV PERCENT PACED: 1 %
Zone Setting Detection Interval: 250 ms

## 2014-05-28 NOTE — Progress Notes (Signed)
Remote ICD transmission.   

## 2014-05-29 DIAGNOSIS — Z125 Encounter for screening for malignant neoplasm of prostate: Secondary | ICD-10-CM | POA: Diagnosis not present

## 2014-05-29 DIAGNOSIS — I472 Ventricular tachycardia: Secondary | ICD-10-CM | POA: Diagnosis not present

## 2014-05-29 DIAGNOSIS — Z95810 Presence of automatic (implantable) cardiac defibrillator: Secondary | ICD-10-CM | POA: Diagnosis not present

## 2014-05-29 DIAGNOSIS — R7401 Elevation of levels of liver transaminase levels: Secondary | ICD-10-CM | POA: Diagnosis not present

## 2014-05-29 DIAGNOSIS — R7402 Elevation of levels of lactic acid dehydrogenase (LDH): Secondary | ICD-10-CM | POA: Diagnosis not present

## 2014-05-29 DIAGNOSIS — R7301 Impaired fasting glucose: Secondary | ICD-10-CM | POA: Diagnosis not present

## 2014-05-29 DIAGNOSIS — Z0000 Encounter for general adult medical examination without abnormal findings: Secondary | ICD-10-CM | POA: Diagnosis not present

## 2014-05-29 DIAGNOSIS — K219 Gastro-esophageal reflux disease without esophagitis: Secondary | ICD-10-CM | POA: Diagnosis not present

## 2014-05-29 DIAGNOSIS — I421 Obstructive hypertrophic cardiomyopathy: Secondary | ICD-10-CM | POA: Diagnosis not present

## 2014-05-29 DIAGNOSIS — Z23 Encounter for immunization: Secondary | ICD-10-CM | POA: Diagnosis not present

## 2014-05-29 DIAGNOSIS — Z1331 Encounter for screening for depression: Secondary | ICD-10-CM | POA: Diagnosis not present

## 2014-05-29 DIAGNOSIS — I4729 Other ventricular tachycardia: Secondary | ICD-10-CM | POA: Diagnosis not present

## 2014-05-30 ENCOUNTER — Encounter: Payer: Self-pay | Admitting: Internal Medicine

## 2014-06-06 ENCOUNTER — Encounter: Payer: Self-pay | Admitting: Cardiology

## 2014-06-08 ENCOUNTER — Encounter: Payer: Self-pay | Admitting: Cardiology

## 2014-06-11 ENCOUNTER — Encounter: Payer: Self-pay | Admitting: Internal Medicine

## 2014-06-14 DIAGNOSIS — B9681 Helicobacter pylori [H. pylori] as the cause of diseases classified elsewhere: Secondary | ICD-10-CM

## 2014-06-14 DIAGNOSIS — Z86010 Personal history of colonic polyps: Secondary | ICD-10-CM

## 2014-06-14 DIAGNOSIS — K2970 Gastritis, unspecified, without bleeding: Secondary | ICD-10-CM

## 2014-06-14 DIAGNOSIS — Z860101 Personal history of adenomatous and serrated colon polyps: Secondary | ICD-10-CM

## 2014-06-14 HISTORY — DX: Gastritis, unspecified, without bleeding: K29.70

## 2014-06-14 HISTORY — DX: Personal history of colonic polyps: Z86.010

## 2014-06-14 HISTORY — DX: Helicobacter pylori (H. pylori) as the cause of diseases classified elsewhere: B96.81

## 2014-06-14 HISTORY — DX: Personal history of adenomatous and serrated colon polyps: Z86.0101

## 2014-06-19 ENCOUNTER — Encounter: Payer: Medicare Other | Admitting: Internal Medicine

## 2014-06-19 ENCOUNTER — Ambulatory Visit (AMBULATORY_SURGERY_CENTER): Payer: Medicare Other | Admitting: Internal Medicine

## 2014-06-19 ENCOUNTER — Encounter: Payer: Self-pay | Admitting: Internal Medicine

## 2014-06-19 VITALS — BP 125/71 | HR 54 | Temp 97.1°F | Resp 12 | Ht 70.0 in | Wt 186.0 lb

## 2014-06-19 DIAGNOSIS — Z86010 Personal history of colonic polyps: Secondary | ICD-10-CM

## 2014-06-19 DIAGNOSIS — D123 Benign neoplasm of transverse colon: Secondary | ICD-10-CM

## 2014-06-19 DIAGNOSIS — D124 Benign neoplasm of descending colon: Secondary | ICD-10-CM | POA: Diagnosis not present

## 2014-06-19 DIAGNOSIS — D126 Benign neoplasm of colon, unspecified: Secondary | ICD-10-CM

## 2014-06-19 DIAGNOSIS — B9681 Helicobacter pylori [H. pylori] as the cause of diseases classified elsewhere: Secondary | ICD-10-CM

## 2014-06-19 DIAGNOSIS — H9190 Unspecified hearing loss, unspecified ear: Secondary | ICD-10-CM | POA: Diagnosis not present

## 2014-06-19 DIAGNOSIS — K219 Gastro-esophageal reflux disease without esophagitis: Secondary | ICD-10-CM | POA: Diagnosis not present

## 2014-06-19 DIAGNOSIS — R1013 Epigastric pain: Secondary | ICD-10-CM | POA: Diagnosis not present

## 2014-06-19 DIAGNOSIS — K2950 Unspecified chronic gastritis without bleeding: Secondary | ICD-10-CM

## 2014-06-19 DIAGNOSIS — I422 Other hypertrophic cardiomyopathy: Secondary | ICD-10-CM | POA: Diagnosis not present

## 2014-06-19 DIAGNOSIS — F79 Unspecified intellectual disabilities: Secondary | ICD-10-CM | POA: Diagnosis not present

## 2014-06-19 MED ORDER — SODIUM CHLORIDE 0.9 % IV SOLN: 500.0000 mL | INTRAVENOUS | Status: DC

## 2014-06-19 NOTE — Patient Instructions (Addendum)
YOU HAD AN ENDOSCOPIC PROCEDURE TODAY AT THE [facility]: Refer to the procedure report that was given to you for any specific questions about what was found during the examination.  If the procedure report does not answer your questions, please call your gastroenterologist to clarify.  If you requested that your care partner not be given the details of your procedure findings, then the procedure report has been included in a sealed envelope for you to review at your convenience later.  YOU SHOULD EXPECT: Some feelings of bloating in the abdomen. Passage of more gas than usual.  Walking can help get rid of the air that was put into your GI tract during the procedure and reduce the bloating. If you had a lower endoscopy (such as a colonoscopy or flexible sigmoidoscopy) you may notice spotting of blood in your stool or on the toilet paper. If you underwent a bowel prep for your procedure, then you may not have a normal bowel movement for a few days.  DIET: Your first meal following the procedure should be a light meal and then it is ok to progress to your normal diet.  A half-sandwich or bowl of soup is an example of a good first meal.  Heavy or fried foods are harder to digest and may make you feel nauseous or bloated.  Likewise meals heavy in dairy and vegetables can cause extra gas to form and this can also increase the bloating.  Drink plenty of fluids but you should avoid alcoholic beverages for 24 hours.  ACTIVITY: Your care partner should take you home directly after the procedure.  You should plan to take it easy, moving slowly for the rest of the day.  You can resume normal activity the day after the procedure however you should NOT DRIVE or use heavy machinery for 24 hours (because of the sedation medicines used during the test).    SYMPTOMS TO REPORT IMMEDIATELY: A gastroenterologist can be reached at any hour.  During normal business hours, 8:30 AM to 5:00 PM Monday through Friday,  call [phone].  After hours and on weekends, please call the GI answering service at [phone] who will take a message and have the physician on call contact you.   Following lower endoscopy (colonoscopy or flexible sigmoidoscopy):  Excessive amounts of blood in the stool  Significant tenderness or worsening of abdominal pains  Swelling of the abdomen that is new, acute  Fever of 100F or higher  Following upper endoscopy (EGD)  Vomiting of blood or coffee ground material  New chest pain or pain under the shoulder blades  Painful or persistently difficult swallowing  New shortness of breath  Fever of 100F or higher  Black, tarry-looking stools  FOLLOW UP: If any biopsies were taken you will be contacted by phone or by letter within the next 1-3 weeks.  Call your gastroenterologist if you have not heard about the biopsies in 3 weeks.  Our staff will call the home number listed on your records the next business day following your procedure to check on you and address any questions or concerns that you may have at that time regarding the information given to you following your procedure. This is a courtesy call and so if there is no answer at the home number and we have not heard from you through the emergency physician on call, we will assume that you have returned to your regular daily activities without incident.  SIGNATURES/CONFIDENTIALITY: You and/or your care   partner have signed paperwork which will be entered into your electronic medical record.  These signatures attest to the fact that that the information above on your After Visit Summary has been reviewed and is understood.  Full responsibility of the confidentiality of this discharge information lies with you and/or your care-partner.  Be sure to take your reflux medication every day 1/2 hour before breakfast. You will need another colonoscopy in 5 years.

## 2014-06-19 NOTE — Progress Notes (Signed)
Report to PACU, RN, vss, BBS= Clear.  

## 2014-06-19 NOTE — Progress Notes (Signed)
Called to room to assist during endoscopic procedure.  Patient ID and intended procedure confirmed with present staff. Received instructions for my participation in the procedure from the performing physician.  

## 2014-06-19 NOTE — Op Note (Signed)
[facility]  [address] [place], [address]   ENDOSCOPY PROCEDURE REPORT  PATIENT: Joseph Morrow, Joseph Morrow  MR#: [id number] BIRTHDATE: Mar 19, 1946 , 27  yrs. old GENDER: male ENDOSCOPIST: Jerene Bears, MD REFERRED BY:  Janalyn Rouse, M.D. PROCEDURE DATE:  06/19/2014 PROCEDURE:  EGD w/ biopsy ASA CLASS:     Class III INDICATIONS:  heartburn and history of GERD. MEDICATIONS: Monitored anesthesia care and Propofol 200 mg IV TOPICAL ANESTHETIC: none  DESCRIPTION OF PROCEDURE: After the risks benefits and alternatives of the procedure were thoroughly explained, informed consent was obtained.  The LB [id number] endoscope was introduced through the mouth and advanced to the second portion of the duodenum , Without limitations.  The instrument was slowly withdrawn as the mucosa was fully examined.  ESOPHAGUS: Slightly irregular Z-line at 37 cm from the incisors. Cold forceps biopsy to exclude Barrett's esophagus.  STOMACH: A 3 cm hiatal hernia was noted.   There was mild antral gastropathy noted.  Cold forcep biopsies were taken at the antrum and angularis.   Stomach otherwise normal.  DUODENUM: The duodenal mucosa showed no abnormalities in the bulb and 2nd part of the duodenum.  Retroflexed views revealed a hiatal hernia.     The scope was then withdrawn from the patient and the procedure completed.  COMPLICATIONS: There were no immediate complications.  ENDOSCOPIC IMPRESSION: 1.   Irregular Z-line at 37 cm from the incisors.  Cold forceps biopsy to exclude Barrett's esophagus 2.   3 cm hiatal hernia 3.   There was mild antral gastropathy noted [T2] 4.   The duodenal mucosa showed no abnormalities in the bulb and 2nd part of the duodenum  RECOMMENDATIONS: 1.  Await biopsy results 2.  Continue taking your PPI (antiacid medicine) once daily.  It is best to be taken 20-30 minutes prior to breakfast meal.  eSigned:  Jerene Bears, MD 06/19/2014 2:43  PM    CC: The Patient, Lang Snow, MD

## 2014-06-19 NOTE — Op Note (Signed)
[facility]  [address]. [place], [address]   COLONOSCOPY PROCEDURE REPORT  PATIENT: Joseph, Morrow  MR#: [id number] BIRTHDATE: [birth year] , 46  yrs. old GENDER: male ENDOSCOPIST: Jerene Bears, MD REFERRED BY:W.  Lutricia Feil, M.D. PROCEDURE DATE:  06/19/2014 PROCEDURE:   Colonoscopy with snare polypectomy First Screening Colonoscopy - Avg.  risk and is 50 yrs.  old or older - No.  Prior Negative Screening - Now for repeat screening. N/A  History of Adenoma - Now for follow-up colonoscopy & has been > or = to 3 yrs.  Yes hx of adenoma.  Has been 3 or more years since last colonoscopy.  Polyps Removed Today? Yes. ASA CLASS:   Class III INDICATIONS:high risk personal history of colonic polyps. MEDICATIONS: Monitored anesthesia care, Propofol 60 mg IV, and Residual sedation present  DESCRIPTION OF PROCEDURE:   After the risks benefits and alternatives of the procedure were thoroughly explained, informed consent was obtained.  The digital rectal exam revealed external hemorrhoids.   The LB [id number]  endoscope was introduced through the anus and advanced to the cecum, which was identified by both the appendix and ileocecal valve. No adverse events experienced.   The quality of the prep was good, using MoviPrep The instrument was then slowly withdrawn as the colon was fully examined.   COLON FINDINGS: Two sessile polyps measuring 5 mm in size were found in the transverse colon and descending colon.  Polypectomies were performed with a cold snare.  The resection was complete, the polyp tissue was completely retrieved and sent to histology.   Moderate sized external and internal hemorrhoids were found.  Retroflexed views revealed internal hemorrhoids. The time to cecum=3 minutes 53 seconds.  Withdrawal time=9 minutes 12 seconds.  The scope was withdrawn and the procedure completed. COMPLICATIONS: There were no immediate complications.  ENDOSCOPIC  IMPRESSION: 1.   Two sessile polyps were found in the transverse colon and descending colon; polypectomies were performed with a cold snare 2.   Moderate sized external and internal hemorrhoids  RECOMMENDATIONS: 1.  Await pathology results 2.  Repeat Colonoscopy in 5 years. 3.  You will receive a letter within 1-2 weeks with the results of your biopsy as well as final recommendations.  Please call my office if you have not received a letter after 3 weeks.  eSigned:  Jerene Bears, MD 06/19/2014 2:47 PM   cc: The Patient; Lang Snow, MD

## 2014-06-20 ENCOUNTER — Telehealth: Payer: Self-pay | Admitting: *Deleted

## 2014-06-20 NOTE — Telephone Encounter (Signed)
  Follow up Call-  Call back number 06/19/2014  Post procedure Call Back phone  # [phone]  Permission to leave phone message Yes     Patient questions:  Do you have a fever, pain , or abdominal swelling? No. Pain Score  0 *  Have you tolerated food without any problems? Yes.    Have you been able to return to your normal activities? Yes.    Do you have any questions about your discharge instructions: Diet   No. Medications  No. Follow up visit  No.  Do you have questions or concerns about your Care? Yes.    Actions: * If pain score is 4 or above: No action needed, pain <4.  Pt is sending questions through mychart, pt states he has questions for the doctor?

## 2014-06-22 DIAGNOSIS — H5032 Intermittent alternating esotropia: Secondary | ICD-10-CM | POA: Diagnosis not present

## 2014-06-22 DIAGNOSIS — H532 Diplopia: Secondary | ICD-10-CM | POA: Diagnosis not present

## 2014-06-25 ENCOUNTER — Encounter: Payer: Self-pay | Admitting: Internal Medicine

## 2014-06-26 ENCOUNTER — Other Ambulatory Visit: Payer: Self-pay

## 2014-06-26 DIAGNOSIS — A048 Other specified bacterial intestinal infections: Secondary | ICD-10-CM

## 2014-06-26 MED ORDER — BIS SUBCIT-METRONID-TETRACYC 140-125-125 MG PO CAPS: 3.0000 | ORAL_CAPSULE | Freq: Three times a day (TID) | ORAL | Status: DC

## 2014-06-27 ENCOUNTER — Encounter: Payer: Medicare Other | Admitting: Internal Medicine

## 2014-07-18 DIAGNOSIS — Z23 Encounter for immunization: Secondary | ICD-10-CM | POA: Diagnosis not present

## 2014-08-20 ENCOUNTER — Ambulatory Visit (INDEPENDENT_AMBULATORY_CARE_PROVIDER_SITE_OTHER): Payer: Medicare Other | Admitting: Cardiology

## 2014-08-20 ENCOUNTER — Other Ambulatory Visit: Payer: Self-pay | Admitting: *Deleted

## 2014-08-20 ENCOUNTER — Encounter: Payer: Self-pay | Admitting: Cardiology

## 2014-08-20 VITALS — BP 130/84 | HR 59 | Ht 70.0 in | Wt 192.0 lb

## 2014-08-20 DIAGNOSIS — Z95810 Presence of automatic (implantable) cardiac defibrillator: Secondary | ICD-10-CM

## 2014-08-20 DIAGNOSIS — I472 Ventricular tachycardia, unspecified: Secondary | ICD-10-CM

## 2014-08-20 DIAGNOSIS — I422 Other hypertrophic cardiomyopathy: Secondary | ICD-10-CM | POA: Diagnosis not present

## 2014-08-20 DIAGNOSIS — I340 Nonrheumatic mitral (valve) insufficiency: Secondary | ICD-10-CM

## 2014-08-20 NOTE — Progress Notes (Signed)
[facility]. [address], [address], [place]  [address] Phone: [phone] Fax:  [phone]  Date:  08/20/2014   ID:  Joseph Morrow, DOB 08-29-1946, MRN [id number]  PCP:  Marton Redwood, MD   History of Present Illness: Joseph Morrow is a 68 y.o. male who had ICD placed secondary to ventricular tachycardia heart rate of 210 beats per minute in the setting of hypertrophic cardiomyopathy with previous benign workup/evaluation including treadmill, monitoring. Had been on beta blocker. Overall he is doing well. No chest pain, no syncope. He did not have any syncope prior to defibrillator placement as well. Very rarely he feels orthostatic symptoms when standing up quickly. This is rare. He is walking, feeling well. Has gained a little bit of weight.   Wt Readings from Last 3 Encounters:  08/20/14 192 lb (87.091 kg)  06/19/14 186 lb (84.369 kg)  05/14/14 186 lb (84.369 kg)     Past Medical History  Diagnosis Date  . Acute cholecystitis 06/21/2011  . GERD (gastroesophageal reflux disease)   . Heart murmur   . Hearing loss   . Bruises easily   . Hypertrophic cardiomyopathy   . Mitral regurgitation     Moderate  . Hiatal hernia   . Septal defect     LV Septal hyptertrophy 67mm, normal EF, minimal LVOT gradiant.  . VT (ventricular tachycardia) 08/18/2013    s/p St. Jude ICD  . Allergy     SEASONAL  . Cataract     Past Surgical History  Procedure Laterality Date  . Laparoscopic cholecystectomy w/ cholangiography  06/23/2011    Dr Margot Chimes  . Appendectomy  1950  . Cholecystectomy  06/24/2011  . Gallbladder surgery    . Implantable cardioverter defibrillator implant  08/18/2013    St. Jude ICD, serial # [id number]  . Colonoscopy      Current Outpatient Prescriptions  Medication Sig Dispense Refill  . aspirin 81 MG tablet Take 81 mg by mouth daily.      Marland Kitchen atenolol (TENORMIN) 50 MG tablet TAKE 1 TABLET BY MOUTH EVERY DAY 90 tablet 1  . Coenzyme Q10 (CO Q 10) 100 MG  CAPS Take 1 capsule by mouth daily.     Marland Kitchen esomeprazole (NEXIUM) 40 MG capsule Take 1 capsule (40 mg total) by mouth daily at 12 noon. 30 capsule 11  . FOLIC ACID PO Take by mouth.    Marland Kitchen glucosamine-chondroitin 500-400 MG tablet Take 1 tablet by mouth daily.    . Probiotic Product (ALIGN) 4 MG CAPS Take 1 capsule by mouth daily.    . Zinc 50 MG CAPS Take 1 capsule by mouth daily.      No current facility-administered medications for this visit.    Allergies:   No Known Allergies  Social History:  The patient  reports that he quit smoking about 33 years ago. He has never used smokeless tobacco. He reports that he drinks alcohol. He reports that he does not use illicit drugs.   ROS:  Please see the history of present illness.   Denies any syncope, bleeding, orthopnea, PND  PHYSICAL EXAM: VS:  BP 130/84 mmHg  Pulse 59  Ht 5\' 10"  (7.867 m)  Wt 192 lb (87.091 kg)  BMI 27.55 kg/m2 Well nourished, well developed, in no acute distress HEENT: normal Neck: no JVD Cardiac:  normal S1, S2; RRR; no murmur Lungs:  clear to auscultation bilaterally, no wheezing, rhonchi or rales Abd: soft, nontender, no hepatomegaly Ext: no  edema Skin: warm and dry Neuro: no focal abnormalities noted  EKG:  Sinus bradycardia rate 54, no pacing, normal     ASSESSMENT AND PLAN:  1. Ventricular tachycardia-status post ICD, St. Jude early December 2014. Ventricular tachycardia heart rate of 210 initiated hospital stay.  Defibrillator. Answered questions. No defibrillations. 2. Hypertrophic cardiomyopathy-beta blocker, defibrillator, brother being seen at [facility]. His son, Saralyn Pilar also has defibrillator, similar morphology to his heart. 3. Mild MR stable-echocardiogram reviewed. 4. We will see back in 12 months.  Signed, Candee Furbish, MD [facility]  08/20/2014 9:51 AM

## 2014-08-20 NOTE — Patient Instructions (Signed)
The current medical regimen is effective;  continue present plan and medications.  Follow up in 1 year with Dr Skains.  You will receive a letter in the mail 2 months before you are due.  Please call us when you receive this letter to schedule your follow up appointment.  

## 2014-08-23 ENCOUNTER — Encounter (HOSPITAL_COMMUNITY): Payer: Self-pay | Admitting: Internal Medicine

## 2014-08-29 ENCOUNTER — Ambulatory Visit (INDEPENDENT_AMBULATORY_CARE_PROVIDER_SITE_OTHER): Payer: Medicare Other | Admitting: *Deleted

## 2014-08-29 ENCOUNTER — Encounter: Payer: Self-pay | Admitting: Internal Medicine

## 2014-08-29 DIAGNOSIS — I472 Ventricular tachycardia, unspecified: Secondary | ICD-10-CM

## 2014-08-29 DIAGNOSIS — I422 Other hypertrophic cardiomyopathy: Secondary | ICD-10-CM | POA: Diagnosis not present

## 2014-08-29 NOTE — Progress Notes (Signed)
Remote ICD transmission.   

## 2014-08-30 LAB — MDC_IDC_ENUM_SESS_TYPE_REMOTE
Battery Remaining Longevity: 88 mo
Battery Remaining Percentage: 86 %
Battery Voltage: 3.07 V
Brady Statistic AP VS Percent: 20 %
Brady Statistic AS VS Percent: 79 %
Brady Statistic RV Percent Paced: 1 %
Date Time Interrogation Session: 20151216070019
HIGH POWER IMPEDANCE MEASURED VALUE: 50 Ohm
HighPow Impedance: 50 Ohm
Lead Channel Impedance Value: 450 Ohm
Lead Channel Impedance Value: 490 Ohm
Lead Channel Pacing Threshold Amplitude: 0.75 V
Lead Channel Pacing Threshold Pulse Width: 0.5 ms
Lead Channel Pacing Threshold Pulse Width: 0.5 ms
Lead Channel Sensing Intrinsic Amplitude: 11.8 mV
Lead Channel Setting Pacing Amplitude: 2.5 V
MDC IDC MSMT LEADCHNL RA PACING THRESHOLD AMPLITUDE: 0.75 V
MDC IDC MSMT LEADCHNL RA SENSING INTR AMPL: 4.6 mV
MDC IDC PG SERIAL: 1129139
MDC IDC SET LEADCHNL RA PACING AMPLITUDE: 2 V
MDC IDC SET LEADCHNL RV PACING PULSEWIDTH: 0.5 ms
MDC IDC SET LEADCHNL RV SENSING SENSITIVITY: 0.5 mV
MDC IDC STAT BRADY AP VP PERCENT: 1 %
MDC IDC STAT BRADY AS VP PERCENT: 1 %
MDC IDC STAT BRADY RA PERCENT PACED: 19 %
Zone Setting Detection Interval: 250 ms
Zone Setting Detection Interval: 300 ms
Zone Setting Detection Interval: 330 ms

## 2014-09-20 ENCOUNTER — Encounter: Payer: Self-pay | Admitting: Cardiology

## 2014-09-20 ENCOUNTER — Telehealth: Payer: Self-pay

## 2014-09-20 NOTE — Telephone Encounter (Signed)
Pt aware.

## 2014-09-20 NOTE — Telephone Encounter (Signed)
-----   Message from Maury Dus, RN sent at 06/26/2014  9:16 AM EDT ----- Regarding: Hpylori stool Pt needs hpylori stool test, order in epic.

## 2014-09-25 DIAGNOSIS — D1801 Hemangioma of skin and subcutaneous tissue: Secondary | ICD-10-CM | POA: Diagnosis not present

## 2014-09-25 DIAGNOSIS — L821 Other seborrheic keratosis: Secondary | ICD-10-CM | POA: Diagnosis not present

## 2014-09-25 DIAGNOSIS — L218 Other seborrheic dermatitis: Secondary | ICD-10-CM | POA: Diagnosis not present

## 2014-09-25 DIAGNOSIS — L812 Freckles: Secondary | ICD-10-CM | POA: Diagnosis not present

## 2014-10-09 ENCOUNTER — Other Ambulatory Visit: Payer: Self-pay | Admitting: Internal Medicine

## 2014-12-13 ENCOUNTER — Encounter: Payer: Self-pay | Admitting: *Deleted

## 2015-01-30 ENCOUNTER — Ambulatory Visit (INDEPENDENT_AMBULATORY_CARE_PROVIDER_SITE_OTHER): Payer: Medicare Other | Admitting: Internal Medicine

## 2015-01-30 ENCOUNTER — Encounter: Payer: Self-pay | Admitting: Internal Medicine

## 2015-01-30 ENCOUNTER — Other Ambulatory Visit: Payer: Self-pay

## 2015-01-30 VITALS — BP 124/82 | HR 59 | Ht 70.0 in | Wt 191.0 lb

## 2015-01-30 DIAGNOSIS — I422 Other hypertrophic cardiomyopathy: Secondary | ICD-10-CM

## 2015-01-30 DIAGNOSIS — I472 Ventricular tachycardia, unspecified: Secondary | ICD-10-CM

## 2015-01-30 LAB — CUP PACEART INCLINIC DEVICE CHECK
Battery Remaining Longevity: 90 mo
HighPow Impedance: 49.7766
Lead Channel Impedance Value: 462.5 Ohm
Lead Channel Pacing Threshold Amplitude: 0.5 V
Lead Channel Pacing Threshold Pulse Width: 0.5 ms
Lead Channel Sensing Intrinsic Amplitude: 12 mV
Lead Channel Sensing Intrinsic Amplitude: 5 mV
Lead Channel Setting Pacing Amplitude: 2 V
Lead Channel Setting Pacing Amplitude: 2.5 V
Lead Channel Setting Pacing Pulse Width: 0.5 ms
Lead Channel Setting Sensing Sensitivity: 0.5 mV
MDC IDC MSMT LEADCHNL RA IMPEDANCE VALUE: 462.5 Ohm
MDC IDC MSMT LEADCHNL RA PACING THRESHOLD AMPLITUDE: 1.25 V
MDC IDC MSMT LEADCHNL RA PACING THRESHOLD PULSEWIDTH: 0.5 ms
MDC IDC PG SERIAL: 1129139
MDC IDC SESS DTM: 20160518110132
MDC IDC STAT BRADY RA PERCENT PACED: 17 %
MDC IDC STAT BRADY RV PERCENT PACED: 0.26 %
Zone Setting Detection Interval: 250 ms
Zone Setting Detection Interval: 300 ms
Zone Setting Detection Interval: 330 ms

## 2015-01-30 NOTE — Patient Instructions (Signed)
Medication Instructions: - none  Labwork: - none  Procedures/Testing: - none  Follow-Up: - Remote monitoring is used to monitor your Pacemaker of ICD from home. This monitoring reduces the number of office visits required to check your device to one time per year. It allows [place] to keep an eye on the functioning of your device to ensure it is working properly. You are scheduled for a device check from home on 05/01/15. You may send your transmission at any time that day. If you have a wireless device, the transmission will be sent automatically. After your physician reviews your transmission, you will receive a postcard with your next transmission date.  - Your physician wants you to follow-up in: 1 year with Dr. Rayann Heman. You will receive a reminder letter in the mail two months in advance. If you don't receive a letter, please call our office to schedule the follow-up appointment.  Any Additional Special Instructions Will Be Listed Below (If Applicable). - none

## 2015-01-30 NOTE — Progress Notes (Signed)
  PCP: Marton Redwood, MD Primary Cardiologist: Maksym Pfiffner is a 69 y.o. male who presents today for routine electrophysiology followup.  He is doing very well.  No symptoms of CHF or arrhythmia. Today, he denies symptoms of palpitations, chest pain, shortness of breath,  lower extremity edema, dizziness, presyncope, syncope, or ICD shocks.  The patient is otherwise without complaint today.   Past Medical History  Diagnosis Date  . Acute cholecystitis 06/21/2011  . GERD (gastroesophageal reflux disease)   . Heart murmur   . Hearing loss   . Bruises easily   . Hypertrophic cardiomyopathy   . Mitral regurgitation     Moderate  . Hiatal hernia   . Septal defect     LV Septal hyptertrophy 19mm, normal EF, minimal LVOT gradiant.  . VT (ventricular tachycardia) 08/18/2013    s/p St. Jude ICD  . Allergy     SEASONAL  . Cataract    Past Surgical History  Procedure Laterality Date  . Laparoscopic cholecystectomy w/ cholangiography  06/23/2011    Dr Margot Chimes  . Appendectomy  1950  . Cholecystectomy  06/24/2011  . Gallbladder surgery    . Implantable cardioverter defibrillator implant  08/18/2013    St. Jude ICD, serial # [id number]  . Colonoscopy    . Implantable cardioverter defibrillator implant N/A 08/18/2013    Procedure: IMPLANTABLE CARDIOVERTER DEFIBRILLATOR IMPLANT;  Surgeon: Coralyn Mark, MD;  Location: [facility] CATH LAB;  Service: Cardiovascular;  Laterality: N/A;    Current Outpatient Prescriptions  Medication Sig Dispense Refill  . aspirin 81 MG tablet Take 81 mg by mouth daily.      Marland Kitchen atenolol (TENORMIN) 50 MG tablet TAKE 1 TABLET BY MOUTH EVERY DAY. 90 tablet 0  . Coenzyme Q10 (CO Q 10) 100 MG CAPS Take 1 capsule by mouth daily.     . Esomeprazole Magnesium (NEXIUM PO) Take 1 tablet by mouth daily. OTC (24 hr)    . FOLIC ACID PO Take 1 tablet by mouth daily.     Marland Kitchen glucosamine-chondroitin 500-400 MG tablet Take 1 tablet by mouth daily.    . Probiotic Product (ALIGN)  4 MG CAPS Take 1 capsule by mouth daily.    . Zinc 50 MG CAPS Take 1 capsule by mouth daily.      No current facility-administered medications for this visit.    Physical Exam: Filed Vitals:   01/30/15 1022  BP: 124/82  Pulse: 59  Height: 5\' 10"  (1.778 m)  Weight: 191 lb (86.637 kg)    GEN- The patient is well appearing, alert and oriented x 3 today.   Head- normocephalic, atraumatic Eyes-  Sclera clear, conjunctiva pink Ears- hearing intact Oropharynx- clear Lungs- Clear to ausculation bilaterally, normal work of breathing Chest- ICD pocket is well healed Heart- Regular rate and rhythm, no murmurs, rubs or gallops, PMI not laterally displaced GI- soft, NT, ND, + BS Extremities- no clubbing, cyanosis, or edema  ICD interrogation- reviewed in detail today,  See PACEART report  Assessment and Plan:  1.  HCM/ Moderate MR Doing well at this time, without symptoms  2. VT Normal ICD function See Pace Art report No changes today  [place] Return in 1 year Follow-up with Dr Marlou Porch as scheduled

## 2015-02-15 ENCOUNTER — Other Ambulatory Visit: Payer: Self-pay | Admitting: Internal Medicine

## 2015-03-26 DIAGNOSIS — H6123 Impacted cerumen, bilateral: Secondary | ICD-10-CM | POA: Diagnosis not present

## 2015-04-15 DIAGNOSIS — I480 Paroxysmal atrial fibrillation: Secondary | ICD-10-CM

## 2015-04-15 HISTORY — DX: Paroxysmal atrial fibrillation: I48.0

## 2015-05-01 ENCOUNTER — Ambulatory Visit (INDEPENDENT_AMBULATORY_CARE_PROVIDER_SITE_OTHER): Payer: Medicare Other | Admitting: *Deleted

## 2015-05-01 DIAGNOSIS — I472 Ventricular tachycardia, unspecified: Secondary | ICD-10-CM

## 2015-05-01 DIAGNOSIS — I422 Other hypertrophic cardiomyopathy: Secondary | ICD-10-CM | POA: Diagnosis not present

## 2015-05-01 NOTE — Progress Notes (Signed)
Remote ICD transmission.   

## 2015-05-08 LAB — CUP PACEART REMOTE DEVICE CHECK
Battery Remaining Longevity: 82 mo
Battery Remaining Percentage: 80 %
Battery Voltage: 3.02 V
Brady Statistic RA Percent Paced: 15 %
Brady Statistic RV Percent Paced: 1 %
Date Time Interrogation Session: 20160817060018
HIGH POWER IMPEDANCE MEASURED VALUE: 43 Ohm
HIGH POWER IMPEDANCE MEASURED VALUE: 43 Ohm
Lead Channel Impedance Value: 440 Ohm
Lead Channel Pacing Threshold Amplitude: 0.5 V
Lead Channel Pacing Threshold Pulse Width: 0.5 ms
Lead Channel Setting Pacing Amplitude: 2 V
MDC IDC MSMT LEADCHNL RA IMPEDANCE VALUE: 430 Ohm
MDC IDC MSMT LEADCHNL RA PACING THRESHOLD AMPLITUDE: 1.25 V
MDC IDC MSMT LEADCHNL RA PACING THRESHOLD PULSEWIDTH: 0.5 ms
MDC IDC MSMT LEADCHNL RA SENSING INTR AMPL: 3.9 mV
MDC IDC MSMT LEADCHNL RV SENSING INTR AMPL: 11.8 mV
MDC IDC PG SERIAL: 1129139
MDC IDC SET LEADCHNL RV PACING AMPLITUDE: 2.5 V
MDC IDC SET LEADCHNL RV PACING PULSEWIDTH: 0.5 ms
MDC IDC SET LEADCHNL RV SENSING SENSITIVITY: 0.5 mV
MDC IDC SET ZONE DETECTION INTERVAL: 300 ms
MDC IDC STAT BRADY AP VP PERCENT: 1 %
MDC IDC STAT BRADY AP VS PERCENT: 15 %
MDC IDC STAT BRADY AS VP PERCENT: 1 %
MDC IDC STAT BRADY AS VS PERCENT: 84 %
Zone Setting Detection Interval: 250 ms
Zone Setting Detection Interval: 330 ms

## 2015-05-16 ENCOUNTER — Encounter: Payer: Self-pay | Admitting: Cardiology

## 2015-05-28 ENCOUNTER — Encounter: Payer: Self-pay | Admitting: Internal Medicine

## 2015-05-30 DIAGNOSIS — Z0000 Encounter for general adult medical examination without abnormal findings: Secondary | ICD-10-CM | POA: Diagnosis not present

## 2015-05-30 DIAGNOSIS — Z79899 Other long term (current) drug therapy: Secondary | ICD-10-CM | POA: Diagnosis not present

## 2015-05-30 DIAGNOSIS — R7301 Impaired fasting glucose: Secondary | ICD-10-CM | POA: Diagnosis not present

## 2015-05-30 DIAGNOSIS — Z125 Encounter for screening for malignant neoplasm of prostate: Secondary | ICD-10-CM | POA: Diagnosis not present

## 2015-05-30 DIAGNOSIS — E781 Pure hyperglyceridemia: Secondary | ICD-10-CM | POA: Diagnosis not present

## 2015-06-05 DIAGNOSIS — Z8719 Personal history of other diseases of the digestive system: Secondary | ICD-10-CM | POA: Diagnosis not present

## 2015-06-05 DIAGNOSIS — Z0000 Encounter for general adult medical examination without abnormal findings: Secondary | ICD-10-CM | POA: Diagnosis not present

## 2015-06-05 DIAGNOSIS — Z23 Encounter for immunization: Secondary | ICD-10-CM | POA: Diagnosis not present

## 2015-06-05 DIAGNOSIS — Z6828 Body mass index (BMI) 28.0-28.9, adult: Secondary | ICD-10-CM | POA: Diagnosis not present

## 2015-06-05 DIAGNOSIS — Z8679 Personal history of other diseases of the circulatory system: Secondary | ICD-10-CM | POA: Diagnosis not present

## 2015-06-05 DIAGNOSIS — R740 Nonspecific elevation of levels of transaminase and lactic acid dehydrogenase [LDH]: Secondary | ICD-10-CM | POA: Diagnosis not present

## 2015-06-05 DIAGNOSIS — Z86010 Personal history of colonic polyps: Secondary | ICD-10-CM | POA: Diagnosis not present

## 2015-06-05 DIAGNOSIS — K219 Gastro-esophageal reflux disease without esophagitis: Secondary | ICD-10-CM | POA: Diagnosis not present

## 2015-06-05 DIAGNOSIS — R7301 Impaired fasting glucose: Secondary | ICD-10-CM | POA: Diagnosis not present

## 2015-06-05 DIAGNOSIS — Z95810 Presence of automatic (implantable) cardiac defibrillator: Secondary | ICD-10-CM | POA: Diagnosis not present

## 2015-06-05 DIAGNOSIS — Z1389 Encounter for screening for other disorder: Secondary | ICD-10-CM | POA: Diagnosis not present

## 2015-06-05 DIAGNOSIS — I421 Obstructive hypertrophic cardiomyopathy: Secondary | ICD-10-CM | POA: Diagnosis not present

## 2015-08-05 ENCOUNTER — Ambulatory Visit (INDEPENDENT_AMBULATORY_CARE_PROVIDER_SITE_OTHER): Payer: Medicare Other | Admitting: *Deleted

## 2015-08-05 DIAGNOSIS — I472 Ventricular tachycardia, unspecified: Secondary | ICD-10-CM

## 2015-08-05 NOTE — Progress Notes (Signed)
Remote ICD transmission.   

## 2015-08-06 LAB — CUP PACEART REMOTE DEVICE CHECK
Battery Voltage: 3.02 V
Brady Statistic AP VS Percent: 16 %
Brady Statistic AS VP Percent: 1 %
HIGH POWER IMPEDANCE MEASURED VALUE: 50 Ohm
HighPow Impedance: 50 Ohm
Implantable Lead Implant Date: 20141205
Implantable Lead Location: 753859
Lead Channel Setting Pacing Amplitude: 2 V
Lead Channel Setting Pacing Amplitude: 2.5 V
MDC IDC LEAD IMPLANT DT: 20141205
MDC IDC LEAD LOCATION: 753860
MDC IDC MSMT BATTERY REMAINING LONGEVITY: 80 mo
MDC IDC MSMT BATTERY REMAINING PERCENTAGE: 78 %
MDC IDC MSMT LEADCHNL RA IMPEDANCE VALUE: 440 Ohm
MDC IDC MSMT LEADCHNL RA SENSING INTR AMPL: 4.6 mV
MDC IDC MSMT LEADCHNL RV IMPEDANCE VALUE: 460 Ohm
MDC IDC MSMT LEADCHNL RV SENSING INTR AMPL: 11.8 mV
MDC IDC SESS DTM: 20161121070015
MDC IDC SET LEADCHNL RV PACING PULSEWIDTH: 0.5 ms
MDC IDC SET LEADCHNL RV SENSING SENSITIVITY: 0.5 mV
MDC IDC STAT BRADY AP VP PERCENT: 1 %
MDC IDC STAT BRADY AS VS PERCENT: 83 %
MDC IDC STAT BRADY RA PERCENT PACED: 15 %
MDC IDC STAT BRADY RV PERCENT PACED: 1 %
Pulse Gen Serial Number: 1129139

## 2015-08-07 ENCOUNTER — Encounter: Payer: Self-pay | Admitting: Cardiology

## 2015-08-07 ENCOUNTER — Encounter: Payer: Self-pay | Admitting: Internal Medicine

## 2015-08-07 DIAGNOSIS — Z1212 Encounter for screening for malignant neoplasm of rectum: Secondary | ICD-10-CM | POA: Diagnosis not present

## 2015-08-13 ENCOUNTER — Encounter: Payer: Self-pay | Admitting: Cardiology

## 2015-08-13 ENCOUNTER — Ambulatory Visit (INDEPENDENT_AMBULATORY_CARE_PROVIDER_SITE_OTHER): Payer: Medicare Other | Admitting: Cardiology

## 2015-08-13 VITALS — BP 112/72 | HR 60 | Ht 70.0 in | Wt 194.0 lb

## 2015-08-13 DIAGNOSIS — I4891 Unspecified atrial fibrillation: Secondary | ICD-10-CM | POA: Diagnosis not present

## 2015-08-13 DIAGNOSIS — I422 Other hypertrophic cardiomyopathy: Secondary | ICD-10-CM | POA: Diagnosis not present

## 2015-08-13 DIAGNOSIS — I340 Nonrheumatic mitral (valve) insufficiency: Secondary | ICD-10-CM

## 2015-08-13 NOTE — Progress Notes (Signed)
Joseph Morrow. [address] [place], [place]  [address] Phone: [phone] Fax:  [phone]  Date:  08/13/2015   ID:  Joseph Morrow, DOB Dec 18, 1945, MRN [id number]  PCP:  Marton Redwood, MD   History of Present Illness: Joseph Morrow is a 69 y.o. male who had ICD placed secondary to ventricular tachycardia heart rate of 210 beats per minute in the setting of hypertrophic cardiomyopathy with previous benign workup/evaluation including treadmill, monitoring. Had been on beta blocker. Overall he is doing well. No chest pain, no syncope. He did not have any syncope prior to defibrillator placement as well. Very rarely he feels orthostatic symptoms when standing up quickly. This is rare. He is walking, feeling well. Has gained a little bit of weight.  During a remote ICD monitoring check in November 2016 1 hour and 52 minutes of atrial fibrillation was detected. He is on aspirin only. CHADS-VASc - 1 (Age).   He thinks that this episode may have coincided with an increase in alcohol/wine intake 1 evening. Otherwise, he is asymptomatic. He is walking well. Never had any prior stroke history. His brother does have atrial fibrillation. He understands correlation with stroke.   Wt Readings from Last 3 Encounters:  08/13/15 194 lb (87.998 kg)  01/30/15 191 lb (86.637 kg)  08/20/14 192 lb (87.091 kg)     Past Medical History  Diagnosis Date  . Acute cholecystitis 06/21/2011  . GERD (gastroesophageal reflux disease)   . Heart murmur   . Hearing loss   . Bruises easily   . Hypertrophic cardiomyopathy ([place])   . Mitral regurgitation     Moderate  . Hiatal hernia   . Septal defect     LV Septal hyptertrophy 74mm, normal EF, minimal LVOT gradiant.  . VT (ventricular tachycardia) ([place]) 08/18/2013    s/p St. Jude ICD  . Allergy     SEASONAL  . Cataract   . Paroxysmal atrial fibrillation ([place]) 04/2015    documented on ICD remote interrogation    Past Surgical History    Procedure Laterality Date  . Laparoscopic cholecystectomy w/ cholangiography  06/23/2011    Dr Margot Chimes  . Appendectomy  1950  . Cholecystectomy  06/24/2011  . Gallbladder surgery    . Implantable cardioverter defibrillator implant  08/18/2013    St. Jude ICD, serial # [id number]  . Colonoscopy    . Implantable cardioverter defibrillator implant N/A 08/18/2013    Procedure: IMPLANTABLE CARDIOVERTER DEFIBRILLATOR IMPLANT;  Surgeon: Coralyn , MD;  Location: [facility] CATH LAB;  Service: Cardiovascular;  Laterality: N/A;    Current Outpatient Prescriptions  Medication Sig Dispense Refill  . aspirin 81 MG tablet Take 81 mg by mouth daily.      Marland Kitchen atenolol (TENORMIN) 50 MG tablet TAKE 1 TABLET BY MOUTH EVERY DAY 90 tablet 3  . Coenzyme Q10 (CO Q 10) 100 MG CAPS Take 1 capsule by mouth daily.     . Esomeprazole Magnesium (NEXIUM PO) Take 1 tablet by mouth daily. OTC (24 hr)    . FOLIC ACID PO Take 1 tablet by mouth daily.     Marland Kitchen glucosamine-chondroitin 500-400 MG tablet Take 1 tablet by mouth daily.    . pantoprazole (PROTONIX) 40 MG tablet TK 1 T PO BID FOR TWO WEEKS THEN TK 1 T PO QD  11  . Probiotic Product (ALIGN) 4 MG CAPS Take 1 capsule by mouth daily.    . Zinc 50 MG CAPS Take 1 capsule by  mouth daily.      No current facility-administered medications for this visit.    Allergies:   No Known Allergies  Social History:  The patient  reports that he quit smoking about 34 years ago. He has never used smokeless tobacco. He reports that he drinks alcohol. He reports that he does not use illicit drugs.   ROS:  Please see the history of present illness.   Denies any syncope, bleeding, orthopnea, PND  PHYSICAL EXAM: VS:  BP 112/72 mmHg  Pulse 60  Ht 5\' 10"  (1.778 m)  Wt 194 lb (87.998 kg)  BMI 27.84 kg/m2  SpO2 98% Well nourished, well developed, in no acute distress HEENT: normal Neck: no JVD Cardiac:  normal S1, S2; RRR; no murmur Lungs:  clear to auscultation bilaterally, no  wheezing, rhonchi or rales Abd: soft, nontender, no hepatomegaly Ext: no edema Skin: warm and dry Neuro: no focal abnormalities noted  EKG:  Today11/29/16 - sinus rhythm, 60, PVC, old septal infarct pattern. Personally viewed-prior Sinus bradycardia rate 54, no pacing, normal     ASSESSMENT AND PLAN:  1. Paroxysmal atrial fibrillation-approximate 2 hours of atrial ablation was detected on ICD monitoring check by Dr. Rayann Heman. CHADS-Vasc-1 (Age). Per guidelines,  No anticoagulation will be started. Once he reaches age 49, anticoagulation will be recommended. Of course, if Dr. Rayann Heman feels differently based upon his hypertrophic morphology I certainly open with him being on anticoagulation.lengthy discussion today. 2. Ventricular tachycardia-status post ICD, St. Jude early December 2014. Ventricular tachycardia heart rate of 210 initiated hospital stay.  Defibrillator. Answered questions. No defibrillations. 3. Hypertrophic cardiomyopathy-beta blocker, defibrillator, brother being seen at [facility]. His son, Saralyn Pilar also has defibrillator, similar morphology to his heart. 4. Mild MR stable-echocardiogram reviewed. 5. We will see back in 6 months.  Signed, Candee Furbish, MD [facility]  08/13/2015 11:17 AM

## 2015-08-13 NOTE — Patient Instructions (Signed)
Your physician recommends that you continue on your current medications as directed. Please refer to the Current Medication list given to you today.  Your physician wants you to follow-up in: 6 months with Dr. Skains. You will receive a reminder letter in the mail two months in advance. If you don't receive a letter, please call our office to schedule the follow-up appointment.  

## 2015-08-17 ENCOUNTER — Encounter: Payer: Self-pay | Admitting: Cardiology

## 2015-08-20 ENCOUNTER — Encounter: Payer: Self-pay | Admitting: Cardiology

## 2015-08-20 DIAGNOSIS — I480 Paroxysmal atrial fibrillation: Secondary | ICD-10-CM | POA: Insufficient documentation

## 2015-08-20 NOTE — Telephone Encounter (Signed)
Atrial fibrillation was documented in your clinic visit that is within the Epic chart as problem #1.  I have increased its priority on the problem list so that now it should be visible to you as well.  Candee Furbish, MD

## 2015-08-29 ENCOUNTER — Encounter: Payer: Self-pay | Admitting: Internal Medicine

## 2015-08-29 NOTE — Telephone Encounter (Signed)
Informed patient that the episode occurred on 8/18 @ 1512. Patient voiced understanding.   Patient stated that he had an incident where he felt "funny" one night and wasn't sure if the AF episode correlated. After giving pt the date and time of the episode he realized that this particular inicidence was not the same.   I encouraged patient to keep a diary of sx's for comparison when he comes into the office. Patient voiced understanding.

## 2015-10-03 DIAGNOSIS — D1801 Hemangioma of skin and subcutaneous tissue: Secondary | ICD-10-CM | POA: Diagnosis not present

## 2015-10-03 DIAGNOSIS — L812 Freckles: Secondary | ICD-10-CM | POA: Diagnosis not present

## 2015-10-03 DIAGNOSIS — L821 Other seborrheic keratosis: Secondary | ICD-10-CM | POA: Diagnosis not present

## 2015-10-03 DIAGNOSIS — D225 Melanocytic nevi of trunk: Secondary | ICD-10-CM | POA: Diagnosis not present

## 2015-10-23 ENCOUNTER — Encounter: Payer: Self-pay | Admitting: *Deleted

## 2015-11-01 ENCOUNTER — Encounter: Payer: Self-pay | Admitting: Internal Medicine

## 2015-11-01 ENCOUNTER — Ambulatory Visit (INDEPENDENT_AMBULATORY_CARE_PROVIDER_SITE_OTHER): Payer: Medicare Other | Admitting: Internal Medicine

## 2015-11-01 VITALS — BP 116/74 | HR 58 | Ht 70.0 in | Wt 196.0 lb

## 2015-11-01 DIAGNOSIS — I472 Ventricular tachycardia, unspecified: Secondary | ICD-10-CM

## 2015-11-01 DIAGNOSIS — I422 Other hypertrophic cardiomyopathy: Secondary | ICD-10-CM | POA: Diagnosis not present

## 2015-11-01 LAB — CUP PACEART INCLINIC DEVICE CHECK
Battery Remaining Longevity: 82.8
Brady Statistic RA Percent Paced: 15 %
HIGH POWER IMPEDANCE MEASURED VALUE: 48.4526
Implantable Lead Implant Date: 20141205
Implantable Lead Implant Date: 20141205
Implantable Lead Location: 753859
Lead Channel Impedance Value: 425 Ohm
Lead Channel Pacing Threshold Amplitude: 0.5 V
Lead Channel Pacing Threshold Amplitude: 0.5 V
Lead Channel Pacing Threshold Amplitude: 1 V
Lead Channel Pacing Threshold Pulse Width: 0.5 ms
Lead Channel Pacing Threshold Pulse Width: 0.5 ms
Lead Channel Pacing Threshold Pulse Width: 0.5 ms
Lead Channel Sensing Intrinsic Amplitude: 12 mV
Lead Channel Sensing Intrinsic Amplitude: 5 mV
Lead Channel Setting Pacing Amplitude: 2 V
Lead Channel Setting Pacing Amplitude: 2.5 V
MDC IDC LEAD LOCATION: 753860
MDC IDC MSMT LEADCHNL RA PACING THRESHOLD AMPLITUDE: 1 V
MDC IDC MSMT LEADCHNL RA PACING THRESHOLD PULSEWIDTH: 0.5 ms
MDC IDC MSMT LEADCHNL RV IMPEDANCE VALUE: 475 Ohm
MDC IDC SESS DTM: 20170217170600
MDC IDC SET LEADCHNL RV PACING PULSEWIDTH: 0.5 ms
MDC IDC SET LEADCHNL RV SENSING SENSITIVITY: 0.5 mV
MDC IDC STAT BRADY RV PERCENT PACED: 0.23 %
Pulse Gen Serial Number: 1129139

## 2015-11-01 NOTE — Progress Notes (Signed)
PCP: Marton Redwood, MD Primary Cardiologist: Demetre Candanoza is a 70 y.o. male who presents today for routine electrophysiology followup.  He is doing very well.  No symptoms of CHF or arrhythmia. Today, he denies symptoms of palpitations, chest pain, shortness of breath,  lower extremity edema, dizziness, presyncope, syncope, or ICD shocks.  The patient is otherwise without complaint today.   Past Medical History  Diagnosis Date  . Acute cholecystitis 06/21/2011  . GERD (gastroesophageal reflux disease)   . Heart murmur   . Hearing loss   . Bruises easily   . Hypertrophic cardiomyopathy ([place])   . Mitral regurgitation     Moderate  . Hiatal hernia   . Septal defect     LV Septal hyptertrophy 93mm, normal EF, minimal LVOT gradiant.  . VT (ventricular tachycardia) ([place]) 08/18/2013    s/p St. Jude ICD  . Allergy     SEASONAL  . Cataract   . Paroxysmal atrial fibrillation ([place]) 04/2015    documented on ICD remote interrogation  . PAF (paroxysmal atrial fibrillation) ([place]) 08/20/2015   Past Surgical History  Procedure Laterality Date  . Laparoscopic cholecystectomy w/ cholangiography  06/23/2011    Dr Margot Chimes  . Appendectomy  1950  . Cholecystectomy  06/24/2011  . Gallbladder surgery    . Implantable cardioverter defibrillator implant  08/18/2013    St. Jude ICD, serial # [id number]  . Colonoscopy    . Implantable cardioverter defibrillator implant N/A 08/18/2013    Procedure: IMPLANTABLE CARDIOVERTER DEFIBRILLATOR IMPLANT;  Surgeon: Coralyn Mark, MD;  Location: [facility] CATH LAB;  Service: Cardiovascular;  Laterality: N/A;    Current Outpatient Prescriptions  Medication Sig Dispense Refill  . aspirin 81 MG tablet Take 81 mg by mouth daily.      Marland Kitchen atenolol (TENORMIN) 50 MG tablet TAKE 1 TABLET BY MOUTH EVERY DAY 90 tablet 3  . Coenzyme Q10 (CO Q 10) 100 MG CAPS Take 1 capsule by mouth daily.     Marland Kitchen FOLIC ACID PO Take 1 tablet by mouth daily.     Marland Kitchen glucosamine-chondroitin  500-400 MG tablet Take 1 tablet by mouth daily.    . pantoprazole (PROTONIX) 40 MG tablet TAKE 1 TABLET BY MOUTH DAILY  11  . Probiotic Product (ALIGN) 4 MG CAPS Take 1 capsule by mouth daily.    . Zinc 50 MG CAPS Take 1 capsule by mouth daily.      No current facility-administered medications for this visit.    Physical Exam: Filed Vitals:   11/01/15 1159  BP: 116/74  Pulse: 58  Height: 5\' 10"  (1.778 m)  Weight: 196 lb (88.905 kg)    GEN- The patient is well appearing, alert and oriented x 3 today.   Head- normocephalic, atraumatic Eyes-  Sclera clear, conjunctiva pink Ears- hearing intact Oropharynx- clear Lungs- Clear to ausculation bilaterally, normal work of breathing Chest- ICD pocket is well healed Heart- Regular rate and rhythm, no murmurs, rubs or gallops, PMI not laterally displaced GI- soft, NT, ND, + BS Extremities- no clubbing, cyanosis, or edema  ICD interrogation- reviewed in detail today,  See PACEART report  Assessment and Plan:  1.  HCM/ Moderate MR Doing well at this time, without symptoms  2. VT Normal ICD function See Pace Art report No changes today I have discussed [facility] Fort[employer] advisary with the patient today. He understands that recommendation from [facility] is to not replace the device at this time. The patient is not device dependant.  The patient has had appropriate device therapy in the past or implanted for secondary prevention.  Vibratory alert demonstrated today.  He  is actively remotely monitored and understands the importance of compliance today.  We will follow conservatively as recommended by [facility] and not consider generator replacement at this time   [place] Return in 1 year Follow-up with Dr Marlou Porch as scheduled  [name], [facility] 11/01/2015 4:56 PM

## 2015-11-01 NOTE — Patient Instructions (Signed)
Medication Instructions:  Your physician recommends that you continue on your current medications as directed. Please refer to the Current Medication list given to you today.   Labwork: None ordered   Testing/Procedures: None ordered   Follow-Up: Remote monitoring is used to monitor your ICD from home. This monitoring reduces the number of office visits required to check your device to one time per year. It allows [place] to keep an eye on the functioning of your device to ensure it is working properly. You are scheduled for a device check from home on 02/03/16. You may send your transmission at any time that day. If you have a wireless device, the transmission will be sent automatically. After your physician reviews your transmission, you will receive a postcard with your next transmission date.  Your physician wants you to follow-up in: 12 months with Dr Vallery Ridge will receive a reminder letter in the mail two months in advance. If you don't receive a letter, please call our office to schedule the follow-up appointment.     Any Other Special Instructions Will Be Listed Below (If Applicable).     If you need a refill on your cardiac medications before your next appointment, please call your pharmacy.

## 2015-11-22 ENCOUNTER — Encounter (HOSPITAL_COMMUNITY): Payer: Self-pay | Admitting: Emergency Medicine

## 2015-11-22 ENCOUNTER — Emergency Department (HOSPITAL_COMMUNITY)
Admission: EM | Admit: 2015-11-22 | Discharge: 2015-11-22 | Disposition: A | Discharge status: Home / Self Care | Payer: Medicare Other | Attending: Emergency Medicine | Admitting: Emergency Medicine

## 2015-11-22 ENCOUNTER — Emergency Department (HOSPITAL_COMMUNITY): Payer: Medicare Other

## 2015-11-22 DIAGNOSIS — I422 Other hypertrophic cardiomyopathy: Secondary | ICD-10-CM | POA: Diagnosis not present

## 2015-11-22 DIAGNOSIS — H269 Unspecified cataract: Secondary | ICD-10-CM | POA: Insufficient documentation

## 2015-11-22 DIAGNOSIS — Z95810 Presence of automatic (implantable) cardiac defibrillator: Secondary | ICD-10-CM | POA: Diagnosis not present

## 2015-11-22 DIAGNOSIS — I340 Nonrheumatic mitral (valve) insufficiency: Secondary | ICD-10-CM | POA: Diagnosis not present

## 2015-11-22 DIAGNOSIS — Z79899 Other long term (current) drug therapy: Secondary | ICD-10-CM | POA: Diagnosis not present

## 2015-11-22 DIAGNOSIS — Z7982 Long term (current) use of aspirin: Secondary | ICD-10-CM | POA: Insufficient documentation

## 2015-11-22 DIAGNOSIS — K219 Gastro-esophageal reflux disease without esophagitis: Secondary | ICD-10-CM | POA: Diagnosis not present

## 2015-11-22 DIAGNOSIS — I472 Ventricular tachycardia: Secondary | ICD-10-CM | POA: Diagnosis not present

## 2015-11-22 DIAGNOSIS — R110 Nausea: Secondary | ICD-10-CM | POA: Diagnosis not present

## 2015-11-22 DIAGNOSIS — H9190 Unspecified hearing loss, unspecified ear: Secondary | ICD-10-CM | POA: Insufficient documentation

## 2015-11-22 DIAGNOSIS — R5381 Other malaise: Secondary | ICD-10-CM | POA: Insufficient documentation

## 2015-11-22 DIAGNOSIS — R61 Generalized hyperhidrosis: Secondary | ICD-10-CM | POA: Diagnosis not present

## 2015-11-22 DIAGNOSIS — K644 Residual hemorrhoidal skin tags: Secondary | ICD-10-CM | POA: Diagnosis not present

## 2015-11-22 DIAGNOSIS — Z87891 Personal history of nicotine dependence: Secondary | ICD-10-CM | POA: Diagnosis not present

## 2015-11-22 LAB — URINALYSIS, ROUTINE W REFLEX MICROSCOPIC
Bilirubin Urine: NEGATIVE
GLUCOSE, UA: NEGATIVE mg/dL
HGB URINE DIPSTICK: NEGATIVE
Ketones, ur: NEGATIVE mg/dL
LEUKOCYTES UA: NEGATIVE
Nitrite: NEGATIVE
PH: 7 (ref 5.0–8.0)
Protein, ur: NEGATIVE mg/dL
SPECIFIC GRAVITY, URINE: 1.016 (ref 1.005–1.030)

## 2015-11-22 LAB — COMPREHENSIVE METABOLIC PANEL
ALBUMIN: 3.9 g/dL (ref 3.5–5.0)
ALT: 24 U/L (ref 17–63)
AST: 26 U/L (ref 15–41)
Alkaline Phosphatase: 74 U/L (ref 38–126)
Anion gap: 14 (ref 5–15)
BUN: 13 mg/dL (ref 6–20)
CHLORIDE: 105 mmol/L (ref 101–111)
CO2: 22 mmol/L (ref 22–32)
CREATININE: 1.25 mg/dL — AB (ref 0.61–1.24)
Calcium: 9.2 mg/dL (ref 8.9–10.3)
GFR calc Af Amer: >60 mL/min (ref >60)
GFR, EST NON AFRICAN AMERICAN: 57 mL/min — AB (ref >60)
GLUCOSE: 115 mg/dL — AB (ref 65–99)
Potassium: 4.1 mmol/L (ref 3.5–5.1)
Sodium: 141 mmol/L (ref 135–145)
Total Bilirubin: 0.6 mg/dL (ref 0.3–1.2)
Total Protein: 6.5 g/dL (ref 6.5–8.1)

## 2015-11-22 LAB — CBC WITH DIFFERENTIAL/PLATELET
BASOS ABS: 0 10*3/uL (ref 0.0–0.1)
BASOS PCT: 1 %
EOS PCT: 3 %
Eosinophils Absolute: 0.2 10*3/uL (ref 0.0–0.7)
HCT: 45.1 % (ref 39.0–52.0)
Hemoglobin: 15.1 g/dL (ref 13.0–17.0)
LYMPHS PCT: 32 %
Lymphs Abs: 1.8 10*3/uL (ref 0.7–4.0)
MCH: 29.4 pg (ref 26.0–34.0)
MCHC: 33.5 g/dL (ref 30.0–36.0)
MCV: 87.7 fL (ref 78.0–100.0)
Monocytes Absolute: 0.6 10*3/uL (ref 0.1–1.0)
Monocytes Relative: 11 %
NEUTROS ABS: 3.1 10*3/uL (ref 1.7–7.7)
Neutrophils Relative %: 53 %
PLATELETS: 187 10*3/uL (ref 150–400)
RBC: 5.14 MIL/uL (ref 4.22–5.81)
RDW: 13 % (ref 11.5–15.5)
WBC: 5.8 10*3/uL (ref 4.0–10.5)

## 2015-11-22 LAB — CBC
HEMATOCRIT: 43 % (ref 39.0–52.0)
HEMATOCRIT: 44.6 % (ref 39.0–52.0)
HEMOGLOBIN: 14.3 g/dL (ref 13.0–17.0)
Hemoglobin: 15.1 g/dL (ref 13.0–17.0)
MCH: 29.3 pg (ref 26.0–34.0)
MCH: 29.9 pg (ref 26.0–34.0)
MCHC: 33.3 g/dL (ref 30.0–36.0)
MCHC: 33.9 g/dL (ref 30.0–36.0)
MCV: 88.1 fL (ref 78.0–100.0)
MCV: 88.3 fL (ref 78.0–100.0)
PLATELETS: 172 10*3/uL (ref 150–400)
Platelets: 180 10*3/uL (ref 150–400)
RBC: 4.88 MIL/uL (ref 4.22–5.81)
RBC: 5.05 MIL/uL (ref 4.22–5.81)
RDW: 13 % (ref 11.5–15.5)
RDW: 13.2 % (ref 11.5–15.5)
WBC: 5.1 10*3/uL (ref 4.0–10.5)
WBC: 6.2 10*3/uL (ref 4.0–10.5)

## 2015-11-22 LAB — TROPONIN I

## 2015-11-22 LAB — POC OCCULT BLOOD, ED: Fecal Occult Bld: NEGATIVE

## 2015-11-22 MED ORDER — SODIUM CHLORIDE 0.9 % IV BOLUS (SEPSIS): 1000.0000 mL | Freq: Once | INTRAVENOUS | Status: DC

## 2015-11-22 NOTE — Discharge Instructions (Signed)
Fatigue  Fatigue is feeling tired all of the time, a lack of energy, or a lack of motivation. Occasional or mild fatigue is often a normal response to activity or life in general. However, long-lasting (chronic) or extreme fatigue may indicate an underlying medical condition.  HOME CARE INSTRUCTIONS   Watch your fatigue for any changes. The following actions may help to lessen any discomfort you are feeling:  · Talk to your health care provider about how much sleep you need each night. Try to get the required amount every night.  · Take medicines only as directed by your health care provider.  · Eat a healthy and nutritious diet. Ask your health care provider if you need help changing your diet.  · Drink enough fluid to keep your urine clear or pale yellow.  · Practice ways of relaxing, such as yoga, meditation, massage therapy, or acupuncture.  · Exercise regularly.    · Change situations that cause you stress. Try to keep your work and personal routine reasonable.  · Do not abuse illegal drugs.  · Limit alcohol intake to no more than 1 drink per day for nonpregnant women and 2 drinks per day for men. One drink equals 12 ounces of beer, 5 ounces of wine, or 1½ ounces of hard liquor.  · Take a multivitamin, if directed by your health care provider.  SEEK MEDICAL CARE IF:   · Your fatigue does not get better.  · You have a fever.    · You have unintentional weight loss or gain.  · You have headaches.    · You have difficulty:      Falling asleep.    Sleeping throughout the night.  · You feel angry, guilty, anxious, or sad.     · You are unable to have a bowel movement (constipation).    · You skin is dry.     · Your legs or another part of your body is swollen.    SEEK IMMEDIATE MEDICAL CARE IF:   · You feel confused.    · Your vision is blurry.  · You feel faint or pass out.    · You have a severe headache.    · You have severe abdominal, pelvic, or back pain.    · You have chest pain, shortness of breath, or an  irregular or fast heartbeat.    · You are unable to urinate or you urinate less than normal.    · You develop abnormal bleeding, such as bleeding from the rectum, vagina, nose, lungs, or nipples.  · You vomit blood.     · You have thoughts about harming yourself or committing suicide.    · You are worried that you might harm someone else.       This information is not intended to replace advice given to you by your health care provider. Make sure you discuss any questions you have with your health care provider.     Document Released: 06/28/2007 Document Revised: 09/21/2014 Document Reviewed: 01/02/2014  [employer]vier Interactive Patient Education ©2016 [employer]

## 2015-11-22 NOTE — Consult Note (Signed)
Primary cardiologist: Dr Marlou Porch and Dr Rayann Heman  HPI: 70 year old male with past medical history of hypertrophic obstructive cardiomyopathy, ventricular tachycardia status post ICD, paroxysmal atrial fibrillation, Gastroesophageal reflux disease for evaluation of nausea and diaphoresis.  Patient typically does not have dyspnea on exertion, orthopnea, PND, pedal edema, palpitations, syncope or exertional chest pain. He noticed mild dyspnea on exertion yesterday. At 3 AM today he awoke from sleep which is not unusual. He described general malaise, diaphoresis, nausea and a feeling of anxiety. He denied chest pain, dyspnea, palpitations, syncope, ICD discharge, fevers or chills. He had mild hematochezia. He presented to the emergency room and cardiology asked to evaluate. Presently asymptomatic. His symptoms resolved spontaneously over time.   (Not in a hospital admission)  No Known Allergies  Past Medical History  Diagnosis Date  . Acute cholecystitis 06/21/2011  . GERD (gastroesophageal reflux disease)   . Hearing loss   . Hypertrophic cardiomyopathy ([place])   . Mitral regurgitation     Moderate  . Hiatal hernia   . Septal defect     LV Septal hyptertrophy 37m, normal EF, minimal LVOT gradiant.  . [place] 08[place]18[place]2013    s/p St. Jude ICD  . Allergy     SEASONAL  . Cataract   . Paroxysmal atrial fibrillation ([place] 04/2015    documented on ICD remote interrogation    Past Surgical History  Procedure Laterality Date  . Laparoscopic cholecystectomy w/ cholangiography  06/23/2011    Dr SMargot Chimes . Appendectomy  1950  . Cholecystectomy  06/24/2011  . Gallbladder surgery    . Implantable cardioverter defibrillator implant  08/18/2013    St. Jude ICD, serial # [id number] . Colonoscopy    . Implantable cardioverter defibrillator implant N/A 08/18/2013    Procedure: IMPLANTABLE CARDIOVERTER DEFIBRILLATOR IMPLANT;  Surgeon: JCoralyn Mark MD;  [facility];   Service: Cardiovascular;  Laterality: N/A;    Social History   Social History  . Marital Status: Married    Spouse Name: N/A  . Number of Children: 2  . Years of Education: N/A   Occupational History  . Not on file.   Social History Main Topics  . Smoking status: Former Smoker    Quit date: 07/06/1981  . Smokeless tobacco: Never Used  . Alcohol Use: Yes     Comment: 3 glasses per day  . Drug Use: No  . Sexual Activity: Not on file   Other Topics Concern  . Not on file   Social History Narrative    Family History  Problem Relation Age of Onset  . Hypertrophic cardiomyopathy Mother   . Pneumonia Father   . Hypertrophic cardiomyopathy Brother   . Heart Problems Brother     VAD  . Heart attack Brother   . Arrhythmia Mother   . Arrhythmia Brother   . Diabetes Brother   . Diabetes Father   . Heart attack Brother   . Heart failure Mother   . Sudden death Brother   . Colon polyps Father     ROS: Recent nausea, diaphoresis and feelings of anxiety but no fevers or chills, productive cough, hemoptysis, dysphasia, odynophagia, melena, dysuria, hematuria, rash, seizure activity, orthopnea, PND, pedal edema, claudication. Remaining systems are negative.  Physical Exam:   Blood pressure 114/66, pulse 57, temperature 98.5 F (36.9 C), temperature source Oral, resp. rate 16, height '5\' 10"'  (1.778 m), weight 197 lb (89.359 kg), SpO2 100 %.  General:  Well developed/well nourished  in NAD Skin warm/dry Patient not depressed No peripheral clubbing Back-normal HEENT-normal/normal eyelids Neck supple/normal carotid upstroke bilaterally; no bruits; no JVD; no thyromegaly chest - CTA/ normal expansion CV - RRR/normal S1 and S2; no rubs or gallops;  PMI nondisplaced; 2/6 systolic murmur left sternal border. Abdomen -NT/ND, no HSM, no mass, + bowel sounds, no bruit 2+ femoral pulses, no bruits Ext-no edema, chords, 2+ DP Neuro-grossly nonfocal  ECG Sinus bradycardia with  nonspecific ST changes.  Results for orders placed or performed during the hospital encounter of 11/22/15 (from the past 48 hour(s))  CBC with Differential     Status: None   Collection Time: 11/22/15  4:35 AM  Result Value Ref Range   WBC 5.8 4.0 - 10.5 K/uL   RBC 5.14 4.22 - 5.81 MIL/uL   Hemoglobin 15.1 13.0 - 17.0 g/dL   HCT 45.1 39.0 - 52.0 %   MCV 87.7 78.0 - 100.0 fL   MCH 29.4 26.0 - 34.0 pg   MCHC 33.5 30.0 - 36.0 g/dL   RDW 13.0 11.5 - 15.5 %   Platelets 187 150 - 400 K/uL   Neutrophils Relative % 53 %   Neutro Abs 3.1 1.7 - 7.7 K/uL   Lymphocytes Relative 32 %   Lymphs Abs 1.8 0.7 - 4.0 K/uL   Monocytes Relative 11 %   Monocytes Absolute 0.6 0.1 - 1.0 K/uL   Eosinophils Relative 3 %   Eosinophils Absolute 0.2 0.0 - 0.7 K/uL   Basophils Relative 1 %   Basophils Absolute 0.0 0.0 - 0.1 K/uL  Comprehensive metabolic panel     Status: Abnormal   Collection Time: 11/22/15  4:35 AM  Result Value Ref Range   Sodium 141 135 - 145 mmol/L   Potassium 4.1 3.5 - 5.1 mmol/L   Chloride 105 101 - 111 mmol/L   CO2 22 22 - 32 mmol/L   Glucose, Bld 115 (H) 65 - 99 mg/dL   BUN 13 6 - 20 mg/dL   Creatinine, Ser 1.25 (H) 0.61 - 1.24 mg/dL   Calcium 9.2 8.9 - 10.3 mg/dL   Total Protein 6.5 6.5 - 8.1 g/dL   Albumin 3.9 3.5 - 5.0 g/dL   AST 26 15 - 41 U/L   ALT 24 17 - 63 U/L   Alkaline Phosphatase 74 38 - 126 U/L   Total Bilirubin 0.6 0.3 - 1.2 mg/dL   GFR calc non Af Amer 57 (L) >60 mL/min   GFR calc Af Amer >60 >60 mL/min    Comment: (NOTE) The eGFR has been calculated using the CKD EPI equation. This calculation has not been validated in all clinical situations. eGFR's persistently <60 mL/min signify possible Chronic Kidney Disease.    Anion gap 14 5 - 15  POC occult blood, ED     Status: None   Collection Time: 11/22/15  7:42 AM  Result Value Ref Range   Fecal Occult Bld NEGATIVE NEGATIVE  Troponin I     Status: None   Collection Time: 11/22/15  8:02 AM  Result Value  Ref Range   Troponin I <0.03 <0.031 ng/mL    Comment:        NO INDICATION OF MYOCARDIAL INJURY.   Urinalysis, Routine w reflex microscopic     Status: None   Collection Time: 11/22/15  8:15 AM  Result Value Ref Range   Color, Urine YELLOW YELLOW   APPearance CLEAR CLEAR   Specific Gravity, Urine 1.016 1.005 - 1.030   pH  7.0 5.0 - 8.0   Glucose, UA NEGATIVE NEGATIVE mg/dL   Hgb urine dipstick NEGATIVE NEGATIVE   Bilirubin Urine NEGATIVE NEGATIVE   Ketones, ur NEGATIVE NEGATIVE mg/dL   Protein, ur NEGATIVE NEGATIVE mg/dL   Nitrite NEGATIVE NEGATIVE   Leukocytes, UA NEGATIVE NEGATIVE    Comment: MICROSCOPIC NOT DONE ON URINES WITH NEGATIVE PROTEIN, BLOOD, LEUKOCYTES, NITRITE, OR GLUCOSE <1000 mg/dL.  CBC     Status: None   Collection Time: 11/22/15  8:58 AM  Result Value Ref Range   WBC 5.1 4.0 - 10.5 K/uL   RBC 4.88 4.22 - 5.81 MIL/uL   Hemoglobin 14.3 13.0 - 17.0 g/dL   HCT 43.0 39.0 - 52.0 %   MCV 88.1 78.0 - 100.0 fL   MCH 29.3 26.0 - 34.0 pg   MCHC 33.3 30.0 - 36.0 g/dL   RDW 13.0 11.5 - 15.5 %   Platelets 180 150 - 400 K/uL    Dg Chest 2 View  11/22/2015  CLINICAL DATA:  Dizziness, malaise, history of hypertrophic cardiomyopathy EXAM: CHEST  2 VIEW COMPARISON:  08/21/2013 FINDINGS: Cardiomediastinal silhouette is stable. Dual lead cardiac pacemaker is unchanged in position. No acute infiltrate or pleural effusion. No pulmonary edema. Stable mild degenerative changes mid thoracic spine. IMPRESSION: No active cardiopulmonary disease. Dual lead cardiac pacemaker is stable in position. Electronically Signed   By: Lahoma Crocker M.D.   On: 11/22/2015 07:57    Assessment/Plan 1 nausea and diaphoresis-etiology unclear. Electrocardiogram shows nonspecific ST changes. Initial troponin normal. Patient did check his pulse at the time of his symptoms and states it was not elevated. He did not have chest pain, palpitations or dyspnea. Question ventricular arrhythmia causing symptoms. We  will arrange for ICD to be interrogated. If normally functioning and no significant arrhythmias would not pursue further cardiac evaluation. 2 status post ICD-ICD to be interrogated. 3 hypertrophic obstructive cardiomyopathy-Continue beta blocker. 4 history of paroxysmal atrial fibrillation-continue beta blocker and aspirin. Follow up with Dr. Marlou Porch for further discussion about anticoagulation. CHADSvasc 1. Device interrogation is normal without evidence of arrhythmia he would not need to be admitted from a cardiac standpoint. Further workup per ER physicians. Kirk Ruths MD 11/22/2015, 10:44 AM

## 2015-11-22 NOTE — ED Provider Notes (Signed)
CSN: [id number]     Arrival date & time 11/22/15  0422 History   First MD Initiated Contact with Patient 11/22/15 [phone]     Chief Complaint  Patient presents with  . Nausea  . Night Sweats     (Consider location/radiation/quality/duration/timing/severity/associated sxs/prior Treatment) HPI Comments: 70yo M w/ h/o HOCM s/p AICD, GERD who p/w nausea and malaise. Patient states that yesterday he felt "off" and had an episode that he describes as "tunnel vision" during which he denies feeling lightheaded or presyncopal. He feels like he was possibly mildly short of breath at work but states he did not think much of it. This morning at 3 AM, he woke up feeling nauseated, sweaty, and with generalized discomfort. He also notes that he noticed some bright red blood in his shorts yesterday. He denies any constipation. He has a distant history of GI bleed related to polyps; last colonoscopy was 2 months ago and was normal. He denies any episodes of chest pain and states that he has been well recently with no cough/cold symptoms, vomiting, diarrhea, or fevers. He denies any abdominal pain or urinary symptoms. No recent changes to his medications. He has been eating and drinking normally.   The history is provided by the patient.    Past Medical History  Diagnosis Date  . Acute cholecystitis 06/21/2011  . GERD (gastroesophageal reflux disease)   . Hearing loss   . Hypertrophic cardiomyopathy ([place])   . Mitral regurgitation     Moderate  . Hiatal hernia   . Septal defect     LV Septal hyptertrophy 40mm, normal EF, minimal LVOT gradiant.  . VT (ventricular tachycardia) ([place]) 08/18/2013    s/p St. Jude ICD  . Allergy     SEASONAL  . Cataract   . Paroxysmal atrial fibrillation ([place]) 04/2015    documented on ICD remote interrogation   Past Surgical History  Procedure Laterality Date  . Laparoscopic cholecystectomy w/ cholangiography  06/23/2011    Dr Margot Chimes  . Appendectomy  1950  . Cholecystectomy   06/24/2011  . Gallbladder surgery    . Implantable cardioverter defibrillator implant  08/18/2013    St. Jude ICD, serial # [id number]  . Colonoscopy    . Implantable cardioverter defibrillator implant N/A 08/18/2013    Procedure: IMPLANTABLE CARDIOVERTER DEFIBRILLATOR IMPLANT;  Surgeon: Coralyn Mark, MD;  Location: [facility] CATH LAB;  Service: Cardiovascular;  Laterality: N/A;   Family History  Problem Relation Age of Onset  . Hypertrophic cardiomyopathy Mother   . Pneumonia Father   . Hypertrophic cardiomyopathy Brother   . Heart Problems Brother     VAD  . Heart attack Brother   . Arrhythmia Mother   . Arrhythmia Brother   . Diabetes Brother   . Diabetes Father   . Heart attack Brother   . Heart failure Mother   . Sudden death Brother   . Colon polyps Father    Social History  Substance Use Topics  . Smoking status: Former Smoker    Quit date: 07/06/1981  . Smokeless tobacco: Never Used  . Alcohol Use: Yes     Comment: 3 glasses per day    Review of Systems 10 Systems reviewed and are negative for acute change except as noted in the HPI.    Allergies  Review of patient's allergies indicates no known allergies.  Home Medications   Prior to Admission medications   Medication Sig Start Date End Date Taking? Authorizing Provider  aspirin 81 MG tablet  Take 81 mg by mouth daily.     Yes Historical Provider, MD  atenolol (TENORMIN) 50 MG tablet TAKE 1 TABLET BY MOUTH EVERY DAY 02/15/15  Yes [name], MD  Coenzyme Q10 (CO Q 10) 100 MG CAPS Take 1 capsule by mouth daily.    Yes Historical Provider, MD  FOLIC ACID PO Take 1 tablet by mouth daily.    Yes Historical Provider, MD  glucosamine-chondroitin 500-400 MG tablet Take 1 tablet by mouth daily.   Yes Historical Provider, MD  pantoprazole (PROTONIX) 40 MG tablet TAKE 1 TABLET BY MOUTH DAILY 07/29/15  Yes Historical Provider, MD  Probiotic Product (ALIGN) 4 MG CAPS Take 1 capsule by mouth daily.   Yes Historical Provider, MD   Zinc 50 MG CAPS Take 1 capsule by mouth daily.    Yes Historical Provider, MD   BP 121/77 mmHg  Pulse 57  Temp(Src) 98.5 F (36.9 C) (Oral)  Resp 16  Ht 5\' 10"  (1.778 m)  Wt 197 lb (89.359 kg)  BMI 28.27 kg/m2  SpO2 99% Physical Exam  Constitutional: He is oriented to person, place, and time. He appears well-developed and well-nourished. No distress.  HENT:  Head: Normocephalic and atraumatic.  Mouth/Throat: Oropharynx is clear and moist.  Moist mucous membranes  Eyes: Conjunctivae are normal. Pupils are equal, round, and reactive to light.  Neck: Neck supple.  Cardiovascular: Normal rate, regular rhythm and normal heart sounds.   No murmur heard. Pulmonary/Chest: Effort normal and breath sounds normal.  Abdominal: Soft. Bowel sounds are normal. He exhibits no distension. There is no tenderness.  Genitourinary:  Small non-thrombosed external hemorrhoid Brown stool in rectal vault, normal rectal tone  Musculoskeletal: He exhibits no edema.  Neurological: He is alert and oriented to person, place, and time.  Fluent speech  Skin: Skin is warm and dry.  Psychiatric: He has a normal mood and affect. Judgment normal.  Nursing note and vitals reviewed.   Chaperone was present during exam.  ED Course  Procedures (including critical care time) Labs Review Labs Reviewed  COMPREHENSIVE METABOLIC PANEL - Abnormal; Notable for the following:    Glucose, Bld 115 (*)    Creatinine, Ser 1.25 (*)    GFR calc non Af Amer 57 (*)    All other components within normal limits  CBC WITH DIFFERENTIAL/PLATELET  TROPONIN I  URINALYSIS, ROUTINE W REFLEX MICROSCOPIC (NOT AT [facility])  CBC  CBC  OCCULT BLOOD X 1 CARD TO LAB, STOOL  POC OCCULT BLOOD, ED    Imaging Review Dg Chest 2 View  11/22/2015  CLINICAL DATA:  Dizziness, malaise, history of hypertrophic cardiomyopathy EXAM: CHEST  2 VIEW COMPARISON:  08/21/2013 FINDINGS: Cardiomediastinal silhouette is stable. Dual lead cardiac pacemaker  is unchanged in position. No acute infiltrate or pleural effusion. No pulmonary edema. Stable mild degenerative changes mid thoracic spine. IMPRESSION: No active cardiopulmonary disease. Dual lead cardiac pacemaker is stable in position. Electronically Signed   By: Lahoma Crocker M.D.   On: 11/22/2015 07:57   I have personally reviewed and evaluated these lab results as part of my medical decision-making.   EKG Interpretation   Date/Time:  Friday November 22 2015 04:37:25 EST Ventricular Rate:  59 PR Interval:  208 QRS Duration: 72 QT Interval:  406 QTC Calculation: 401 R Axis:   84 Text Interpretation:  Sinus bradycardia Otherwise normal ECG No  significant change since last tracing Confirmed by Adlai Sinning MD, Skye Rodarte  [id number]) on 11/22/2015 7:15:21 AM  MDM   Final diagnoses:  Nausea  Malaise    Pt w/ h/o HOCM and distant GI bleed hx p/w nausea, diaphoresis, and feeling unwell this morning in setting of feeling "off" yesterday. On exam, he was well-appearing with normal vital signs. He had brown stool with small nonthrombosed hemorrhoid on exam. Hemoccult negative. No abdominal tenderness. EKG shows sinus bradycardia, unchanged from previous. DDx is broad and includes arrhythmia, ACS, GI bleed, or infection.   Labwork shows initial hemoglobin of 15.1, creatinine slightly elevated from baseline at 1.25, otherwise unremarkable labs. Chest x-ray unremarkable. Anicteric gated device which shows no arrhythmia or cardiac event to explain the patient's symptoms. Because of his cardiac history, I contacted cardiology and pt was evaluated by Dr. Stanford Breed, who recommended no further cardiac work up at this time given reassuring labs and EKG. I obtained 2 serial Hbg which were  14.3, and then 15.1. Given negative hemoccult, I do not suspect significant GI bleed as the cause of his symptoms. On several reexaminations, the patient is well-appearing with normal vital signs and no complaints. He has been able  to eat and drink here with no problems. His initial troponin was obtained over 3 hours after onset of symptoms, therefore I feel 1 negative troponin is sufficient. I have discussed supportive care and importance of follow-up with his cardiologist, Dr. Marlou Porch, as well as his PCP if any ongoing problems. Return precautions reviewed including chest pain and shortness of breath. the patient voiced understanding and was discharged in satisfactory condition.  Sharlett Iles, MD 11/22/15 1350

## 2015-11-22 NOTE — ED Notes (Signed)
Pt transported to xray 

## 2015-11-22 NOTE — ED Notes (Signed)
Patient transported to X-ray 

## 2015-11-22 NOTE — ED Notes (Signed)
Pt. woke up this morning with nausea , sweats and generalized discomfort " I don't feel good " , denies chest pain /respirations unlabored.

## 2015-11-22 NOTE — Progress Notes (Signed)
  The patient's St. Jude device was interrogated and showed no evidence of arrhythmias per report. No further cardiac evaluation indicated at this time.  Signed, Erma Heritage, PA-C 11/22/2015, 1:24 PM Pager: [phone]

## 2016-01-18 DIAGNOSIS — J069 Acute upper respiratory infection, unspecified: Secondary | ICD-10-CM | POA: Diagnosis not present

## 2016-01-20 DIAGNOSIS — R05 Cough: Secondary | ICD-10-CM | POA: Diagnosis not present

## 2016-01-20 DIAGNOSIS — K219 Gastro-esophageal reflux disease without esophagitis: Secondary | ICD-10-CM | POA: Diagnosis not present

## 2016-02-03 ENCOUNTER — Ambulatory Visit (INDEPENDENT_AMBULATORY_CARE_PROVIDER_SITE_OTHER): Payer: Medicare Other | Admitting: *Deleted

## 2016-02-03 DIAGNOSIS — I422 Other hypertrophic cardiomyopathy: Secondary | ICD-10-CM

## 2016-02-03 NOTE — Progress Notes (Signed)
[facility] Summary Report / Loop Recorder 

## 2016-02-11 ENCOUNTER — Other Ambulatory Visit: Payer: Self-pay | Admitting: Internal Medicine

## 2016-02-24 LAB — CUP PACEART REMOTE DEVICE CHECK
Brady Statistic AP VP Percent: 1 %
Brady Statistic AP VS Percent: 15 %
Brady Statistic RV Percent Paced: 1 %
Date Time Interrogation Session: 20170522065746
HIGH POWER IMPEDANCE MEASURED VALUE: 48 Ohm
HighPow Impedance: 48 Ohm
Implantable Lead Implant Date: 20141205
Lead Channel Sensing Intrinsic Amplitude: 12 mV
Lead Channel Setting Pacing Amplitude: 2 V
Lead Channel Setting Pacing Pulse Width: 0.5 ms
Lead Channel Setting Sensing Sensitivity: 0.5 mV
MDC IDC LEAD IMPLANT DT: 20141205
MDC IDC LEAD LOCATION: 753859
MDC IDC LEAD LOCATION: 753860
MDC IDC MSMT BATTERY REMAINING LONGEVITY: 75 mo
MDC IDC MSMT BATTERY REMAINING PERCENTAGE: 74 %
MDC IDC MSMT BATTERY VOLTAGE: 3.01 V
MDC IDC MSMT LEADCHNL RA IMPEDANCE VALUE: 440 Ohm
MDC IDC MSMT LEADCHNL RA SENSING INTR AMPL: 5 mV
MDC IDC MSMT LEADCHNL RV IMPEDANCE VALUE: 510 Ohm
MDC IDC SET LEADCHNL RV PACING AMPLITUDE: 2.5 V
MDC IDC STAT BRADY AS VP PERCENT: 1 %
MDC IDC STAT BRADY AS VS PERCENT: 84 %
MDC IDC STAT BRADY RA PERCENT PACED: 14 %
Pulse Gen Serial Number: 1129139

## 2016-02-27 ENCOUNTER — Encounter: Payer: Self-pay | Admitting: Cardiology

## 2016-02-27 DIAGNOSIS — M3500 Sicca syndrome, unspecified: Secondary | ICD-10-CM | POA: Diagnosis not present

## 2016-02-27 DIAGNOSIS — K219 Gastro-esophageal reflux disease without esophagitis: Secondary | ICD-10-CM | POA: Diagnosis not present

## 2016-02-27 DIAGNOSIS — Z6828 Body mass index (BMI) 28.0-28.9, adult: Secondary | ICD-10-CM | POA: Diagnosis not present

## 2016-02-27 DIAGNOSIS — R05 Cough: Secondary | ICD-10-CM | POA: Diagnosis not present

## 2016-03-10 ENCOUNTER — Encounter: Payer: Self-pay | Admitting: Pulmonary Disease

## 2016-03-10 ENCOUNTER — Ambulatory Visit (INDEPENDENT_AMBULATORY_CARE_PROVIDER_SITE_OTHER): Payer: Medicare Other | Admitting: Pulmonary Disease

## 2016-03-10 VITALS — BP 118/72 | HR 59 | Ht 70.0 in | Wt 192.8 lb

## 2016-03-10 DIAGNOSIS — Z87891 Personal history of nicotine dependence: Secondary | ICD-10-CM

## 2016-03-10 DIAGNOSIS — R05 Cough: Secondary | ICD-10-CM | POA: Diagnosis not present

## 2016-03-10 DIAGNOSIS — K219 Gastro-esophageal reflux disease without esophagitis: Secondary | ICD-10-CM

## 2016-03-10 DIAGNOSIS — R059 Cough, unspecified: Secondary | ICD-10-CM | POA: Insufficient documentation

## 2016-03-10 NOTE — Patient Instructions (Signed)
   Always you have any new breathing problems or your cough gets worse before your next appointment  We will review your CT scan at your next appointment  You will have a breathing test at your next appointment in 4-6 weeks  TESTS ORDERED: 1. CT chest without contrast 2. Full pulmonary function testing at follow-up appointment

## 2016-03-10 NOTE — Progress Notes (Signed)
Subjective:    Patient ID: Joseph Morrow, male    DOB: 11-03-1945, 70 y.o.   MRN: [id number]  HPI He reports that he previously had reflux of stomach contents after eating pizza late at night about 3 months ago. This resulted with coughing with inspiration. He reports this has happened in the past but never as severely. He reports since this incident he has had an intermittent cough. The cough seems to occur more at night but rarely wakes him up at night. His wife has noted he coughs during the day as well. Denies any mucus production with his cough. No post-tussive emesis or near syncope. He reports his cough seems to be improving mildly. Denies any wheezing. No chest pain, tightness, or pressure. Denies any associated dyspnea. Talking seems to trigger cough. He doesn't recall any other cough triggers. Denies any history of recurrent bronchitis or pneumonia. No history of childhood breathing problems or allergies. No fever, chills, or sweats. He reports a history of chronic reflux and has been on PPI's chronically. He has been on nightly Zantac for about 2 weeks recently.   Review of Systems No rases but does bruise easily. Denies any sinus congestion, pressure, or drainage. A pertinent 14 point review of systems is negative except as per the history of presenting illness.  No Known Allergies  Current Outpatient Prescriptions on File Prior to Visit  Medication Sig Dispense Refill  . aspirin 81 MG tablet Take 81 mg by mouth daily.      Marland Kitchen atenolol (TENORMIN) 50 MG tablet TAKE 1 TABLET BY MOUTH EVERY DAY 90 tablet 3  . Coenzyme Q10 (CO Q 10) 100 MG CAPS Take 1 capsule by mouth daily.     Marland Kitchen FOLIC ACID PO Take 1 tablet by mouth daily.     Marland Kitchen glucosamine-chondroitin 500-400 MG tablet Take 1 tablet by mouth daily.    . pantoprazole (PROTONIX) 40 MG tablet TAKE 1 TABLET BY MOUTH DAILY  11  . Probiotic Product (ALIGN) 4 MG CAPS Take 1 capsule by mouth daily.    . Zinc 50 MG CAPS Take 1 capsule by  mouth daily.      No current facility-administered medications on file prior to visit.    Past Medical History  Diagnosis Date  . Acute cholecystitis 06/21/2011  . GERD (gastroesophageal reflux disease)   . Hearing loss   . Hypertrophic cardiomyopathy ([place])   . Mitral regurgitation     Moderate  . Hiatal hernia   . Septal defect     LV Septal hyptertrophy 55mm, normal EF, minimal LVOT gradiant.  . VT (ventricular tachycardia) ([place]) 08/18/2013    s/p St. Jude ICD  . Allergy     SEASONAL  . Cataract   . Paroxysmal atrial fibrillation ([place]) 04/2015    documented on ICD remote interrogation    Past Surgical History  Procedure Laterality Date  . Appendectomy  1950  . Cholecystectomy  06/24/2011  . Implantable cardioverter defibrillator implant  08/18/2013    St. Jude ICD, serial # [id number]  . Colonoscopy    . Implantable cardioverter defibrillator implant N/A 08/18/2013    Procedure: IMPLANTABLE CARDIOVERTER DEFIBRILLATOR IMPLANT;  Surgeon: Coralyn Mark, MD;  Location: [facility] CATH LAB;  Service: Cardiovascular;  Laterality: N/A;    Family History  Problem Relation Age of Onset  . Hypertrophic cardiomyopathy Mother   . Arrhythmia Mother   . Heart failure Mother   . Pneumonia Father   . Diabetes Father   .  Colon polyps Father   . Hypertrophic cardiomyopathy Brother   . Heart Problems Brother     VAD  . Heart attack Brother   . Arrhythmia Brother   . Diabetes Brother   . Sudden death Brother   . Lung disease Neg Hx     Social History   Social History  . Marital Status: Married    Spouse Name: N/A  . Number of Children: 2  . Years of Education: N/A   Occupational History  . retired    Social History Main Topics  . Smoking status: Former Smoker -- 2.00 packs/day for 20 years    Types: Cigarettes    Quit date: 07/06/1981  . Smokeless tobacco: Never Used  . Alcohol Use: 0.0 oz/week    0 Standard drinks or equivalent per week     Comment: 2 glasses per day  . Drug  Use: No  . Sexual Activity: Not Asked   Other Topics Concern  . None   Social History Narrative   Originally from [place]. Moved to [place] in 2012. Currently has no pets. No bird or hot tub exposure. 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 He did have exposure to Beneze, Acetone, Styrene, & Hydrocarbon early in his career.      Objective:   Physical Exam BP 118/72 mmHg  Pulse 59  Ht 5\' 10"  (1.778 m)  Wt 192 lb 12.8 oz (87.454 kg)  BMI 27.66 kg/m2  SpO2 98% General:  Awake. Alert. No acute distress.  Integument:  Warm & dry. No rash on exposed skin. No bruising. Lymphatics:  No appreciated cervical or supraclavicular lymphadenoapthy. HEENT:  Moist mucus membranes. No oral ulcers. No scleral injection or icterus. Cardiovascular:  Regular rate. No edema. No appreciable JVD.  Pulmonary:  Good aeration & clear to auscultation bilaterally. Symmetric chest wall expansion. No accessory muscle use. Abdomen: Soft. Normal bowel sounds. Nondistended. Grossly nontender. Musculoskeletal:  Normal bulk and tone. Hand grip strength 5/5 bilaterally. No joint deformity or effusion appreciated. Neurological:  CN 2-12 grossly in tact. No meningismus. Moving all 4 extremities equally. Symmetric brachioradialis deep tendon reflexes. Psychiatric:  Mood and affect congruent. Speech normal rhythm, rate & tone.   IMAGING CXR PA/LAT 01/20/16 (per radiologist): Heart normal in size. No edema, mass, or effusion. Pacemaker noted.  CXR PA/LAT 11/22/15 (personally reviewed by me): Mild flattening of the diaphragms suggestive of hyperinflation. Implanted device noted in left upper chest with leads. No parenchymal nodule or opacity. No pleural effusion. Heart normal in size & mediastinum normal in contour.  CARDIAC TTE (07/12/13): LV normal in size with moderate basal septal hypertrophy. EF 65-70%. Grade 2 diastolic dysfunction. LA & RA mildly dilated. RV mildly dilated with preserved  systolic function. Pulmonary artery normal in size. RVSP 33 mmHg. No aortic stenosis or regurgitation. Aortic root nondilated. Mild mitral regurgitation without stenosis. Trivial pulmonic regurgitation. Mild tricuspid regurgitation. No pericardial effusion.  LABS  11/22/15 CBC: 6.2/15.1/44.6/172    Assessment & Plan:  70 year old male with prior history of tobacco use. Presenting with persistent cough likely secondary to aspiration event. Patient does have underlying reflux which likely predisposed him to this. Given his ongoing cough and normal chest x-ray imaging I feel it's reasonable to evaluate his lung parenchyma  more closely with CT imaging. I instructed the patient contact my office if he had any worsening of his cough or new breathing problems before his next appointment.  1. Cough: Checking CT chest without contrast.  2. GERD: Recommend continuing Protonix & Zantac. No changes. 3. H/O Tobacco Use: Checking full pulmonary function testing at follow-up appointment. 4. Follow-up: Patient to return to clinic in 4-6 weeks.  Sonia Baller Ashok Cordia, M.D. [facility] Pulmonary & Critical Care Pager:  [phone] After 3pm or if no response, call [phone] 12:13 PM 03/10/2016

## 2016-03-20 ENCOUNTER — Ambulatory Visit (INDEPENDENT_AMBULATORY_CARE_PROVIDER_SITE_OTHER)
Admission: RE | Admit: 2016-03-20 | Discharge: 2016-03-20 | Disposition: A | Discharge status: Home / Self Care | Payer: Medicare Other | Source: Ambulatory Visit | Attending: Pulmonary Disease | Admitting: Pulmonary Disease

## 2016-03-20 DIAGNOSIS — R918 Other nonspecific abnormal finding of lung field: Secondary | ICD-10-CM | POA: Insufficient documentation

## 2016-03-20 DIAGNOSIS — R05 Cough: Secondary | ICD-10-CM | POA: Diagnosis not present

## 2016-03-20 DIAGNOSIS — R059 Cough, unspecified: Secondary | ICD-10-CM

## 2016-03-20 DIAGNOSIS — Z87891 Personal history of nicotine dependence: Secondary | ICD-10-CM | POA: Diagnosis not present

## 2016-03-20 DIAGNOSIS — K219 Gastro-esophageal reflux disease without esophagitis: Secondary | ICD-10-CM | POA: Diagnosis not present

## 2016-03-24 DIAGNOSIS — H532 Diplopia: Secondary | ICD-10-CM | POA: Diagnosis not present

## 2016-03-24 DIAGNOSIS — H5005 Alternating esotropia: Secondary | ICD-10-CM | POA: Diagnosis not present

## 2016-04-15 DIAGNOSIS — H6123 Impacted cerumen, bilateral: Secondary | ICD-10-CM | POA: Diagnosis not present

## 2016-04-15 DIAGNOSIS — H60333 Swimmer's ear, bilateral: Secondary | ICD-10-CM | POA: Diagnosis not present

## 2016-04-17 ENCOUNTER — Ambulatory Visit (INDEPENDENT_AMBULATORY_CARE_PROVIDER_SITE_OTHER): Payer: Medicare Other | Admitting: Pulmonary Disease

## 2016-04-17 DIAGNOSIS — R05 Cough: Secondary | ICD-10-CM | POA: Diagnosis not present

## 2016-04-17 DIAGNOSIS — R059 Cough, unspecified: Secondary | ICD-10-CM

## 2016-04-17 LAB — PULMONARY FUNCTION TEST
DL/VA % PRED: 94 %
DL/VA: 4.36 ml/min/mmHg/L
DLCO COR % PRED: 80 %
DLCO COR: 26.06 ml/min/mmHg
DLCO UNC % PRED: 82 %
DLCO unc: 26.56 ml/min/mmHg
FEF 25-75 POST: 2.86 L/s
FEF 25-75 Pre: 1.93 L/sec
FEF2575-%Change-Post: 48 %
FEF2575-%Pred-Post: 118 %
FEF2575-%Pred-Pre: 79 %
FEV1-%CHANGE-POST: 13 %
FEV1-%PRED-PRE: 77 %
FEV1-%Pred-Post: 87 %
FEV1-POST: 2.82 L
FEV1-Pre: 2.49 L
FEV1FVC-%CHANGE-POST: 8 %
FEV1FVC-%Pred-Pre: 98 %
FEV6-%Change-Post: 4 %
FEV6-%PRED-PRE: 83 %
FEV6-%Pred-Post: 87 %
FEV6-PRE: 3.46 L
FEV6-Post: 3.62 L
FEV6FVC-%Pred-Post: 106 %
FEV6FVC-%Pred-Pre: 106 %
FVC-%Change-Post: 4 %
FVC-%PRED-POST: 82 %
FVC-%PRED-PRE: 78 %
FVC-POST: 3.62 L
FVC-PRE: 3.46 L
POST FEV6/FVC RATIO: 100 %
PRE FEV1/FVC RATIO: 72 %
Post FEV1/FVC ratio: 78 %
Pre FEV6/FVC Ratio: 100 %
RV % pred: 113 %
RV: 2.8 L
TLC % pred: 90 %
TLC: 6.36 L

## 2016-04-17 NOTE — Progress Notes (Signed)
PFT performed today. 

## 2016-04-20 ENCOUNTER — Ambulatory Visit (INDEPENDENT_AMBULATORY_CARE_PROVIDER_SITE_OTHER): Payer: Medicare Other | Admitting: Pulmonary Disease

## 2016-04-20 ENCOUNTER — Encounter: Payer: Self-pay | Admitting: Pulmonary Disease

## 2016-04-20 VITALS — BP 108/68 | HR 70 | Ht 70.0 in | Wt 187.6 lb

## 2016-04-20 DIAGNOSIS — K219 Gastro-esophageal reflux disease without esophagitis: Secondary | ICD-10-CM | POA: Diagnosis not present

## 2016-04-20 DIAGNOSIS — K449 Diaphragmatic hernia without obstruction or gangrene: Secondary | ICD-10-CM

## 2016-04-20 DIAGNOSIS — R918 Other nonspecific abnormal finding of lung field: Secondary | ICD-10-CM

## 2016-04-20 DIAGNOSIS — R059 Cough, unspecified: Secondary | ICD-10-CM

## 2016-04-20 DIAGNOSIS — R05 Cough: Secondary | ICD-10-CM

## 2016-04-20 NOTE — Progress Notes (Signed)
Subjective:    Patient ID: Joseph Morrow, male    DOB: February 21, 1946, 70 y.o.   MRN: [id number]  C.C.:  Follow-up for Cough/Possible Asthma, RML  Nodules, & GERD.  HPI Cough/Possible Asthma:  Significant bronchodilator response seen on spirometry without fixed airway obstruction. He reports he only rarely misses his cough. Denies any wheezing. He did wake up coughing once last night. Denies any dyspnea.   RML Nodules: Seen on CT imaging July 2017. Largest measures 5 mm.  GERD: On Protonix & Zantac. No reflux or dyspepsia. No morning brash water taste. Compliant with medications. He reports a couple of days ago he did have reflux into the back of his throat. Last documented EGD was 2015 showing hiatal hernia and some gastritis.   Review of Systems No sinus congestion, drainage or pressure. No chest pain or pressure. No fever, chills, or sweats.   No Known Allergies  Current Outpatient Prescriptions on File Prior to Visit  Medication Sig Dispense Refill  . aspirin 81 MG tablet Take 81 mg by mouth daily.      Marland Kitchen atenolol (TENORMIN) 50 MG tablet TAKE 1 TABLET BY MOUTH EVERY DAY 90 tablet 3  . Coenzyme Q10 (CO Q 10) 100 MG CAPS Take 1 capsule by mouth daily.     Marland Kitchen FOLIC ACID PO Take 1 tablet by mouth daily.     Marland Kitchen glucosamine-chondroitin 500-400 MG tablet Take 1 tablet by mouth daily.    . pantoprazole (PROTONIX) 40 MG tablet TAKE 1 TABLET BY MOUTH DAILY  11  . Probiotic Product (ALIGN) 4 MG CAPS Take 1 capsule by mouth daily.    . ranitidine (ZANTAC) 150 MG tablet Once daily in evenings  1  . Zinc 50 MG CAPS Take 1 capsule by mouth daily.      No current facility-administered medications on file prior to visit.     Past Medical History:  Diagnosis Date  . Acute cholecystitis 06/21/2011  . Allergy    SEASONAL  . Cataract   . GERD (gastroesophageal reflux disease)   . Hearing loss   . Hiatal hernia   . Hypertrophic cardiomyopathy ([place])   . Mitral regurgitation    Moderate  .  Paroxysmal atrial fibrillation ([place]) 04/2015   documented on ICD remote interrogation  . Septal defect    LV Septal hyptertrophy 62mm, normal EF, minimal LVOT gradiant.  . VT (ventricular tachycardia) ([place]) 08/18/2013   s/p St. Jude ICD    Past Surgical History:  Procedure Laterality Date  . APPENDECTOMY  1950  . CHOLECYSTECTOMY  06/24/2011  . COLONOSCOPY    . IMPLANTABLE CARDIOVERTER DEFIBRILLATOR IMPLANT  08/18/2013   St. Jude ICD, serial # [id number]  . IMPLANTABLE CARDIOVERTER DEFIBRILLATOR IMPLANT N/A 08/18/2013   Procedure: IMPLANTABLE CARDIOVERTER DEFIBRILLATOR IMPLANT;  Surgeon: Coralyn Mark, MD;  Location: [place] CATH LAB;  Service: Cardiovascular;  Laterality: N/A;    Family History  Problem Relation Age of Onset  . Hypertrophic cardiomyopathy Mother   . Arrhythmia Mother   . Heart failure Mother   . Pneumonia Father   . Diabetes Father   . Colon polyps Father   . Hypertrophic cardiomyopathy Brother   . Heart Problems Brother     VAD  . Heart attack Brother   . Arrhythmia Brother   . Diabetes Brother   . Sudden death Brother   . Lung disease Neg Hx     Social History   Social History  . Marital status: Married  Spouse name: N/A  . Number of children: 2  . Years of education: N/A   Occupational History  . retired    Social History Main Topics  . Smoking status: Former Smoker    Packs/day: 2.00    Years: 20.00    Types: Cigarettes    Quit date: 07/06/1981  . Smokeless tobacco: Never Used  . Alcohol use 0.0 oz/week     Comment: 2 glasses per day  . Drug use: No  . Sexual activity: Not Asked   Other Topics Concern  . None   Social History Narrative   Originally from [place]. Moved to [place] in 2012. Currently has no pets. No bird or hot tub exposure. 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 He did have exposure to Beneze, Acetone, Styrene, & Hydrocarbon early in his career.      Objective:   Physical Exam BP 108/68  (BP Location: Left Arm, Cuff Size: Normal)   Pulse 70   Ht 5\' 10"  (1.778 m)   Wt 187 lb 9.6 oz (85.1 kg)   SpO2 98%   BMI 26.92 kg/m  General:  Comfortable. No distress. Well-dressed. Integument:  Warm & dry. No rash on exposed skin.  Lymphatics:  No appreciated cervical or supraclavicular lymphadenoapthy. HEENT:  Moist mucus membranes. No oral ulcers. No scleral icterus. Cardiovascular:  Regular rate. No edema. Normal S1 & S2 Pulmonary: Normal work of breathing on room air. Speaking in complete sentences. Clear on auscultation. Abdomen: Soft. Normal bowel sounds. Nontender.  PFT 04/17/16: FVC 3.46 L (78%) FEV1 2.49 L (77%) FEV1/FVC 0.72 FEF 25-75 1.93 L (79%) positive bronchodilator response TLC 6.36 L (90%) RV 113% ERV 62% DLCO uncorrected 80% (Hgb 15.7)  IMAGING CT CHEST W/O 03/20/16 (personally reviewed by me): Subcentimeter cluster of nodules right middle lobe. Largest nodule measures 5 mm. No evidence of or suggestion of bronchiectasis. No pleural effusion or thickening. No pericardial effusion. No pathologic mediastinal adenopathy. o CXR PA/LAT 01/20/16 (per radiologist): Heart normal in size. No edema, mass, or effusion. Pacemaker noted.  CXR PA/LAT 11/22/15 (previously reviewed by me): Mild flattening of the diaphragms suggestive of hyperinflation. Implanted device noted in left upper chest with leads. No parenchymal nodule or opacity. No pleural effusion. Heart normal in size & mediastinum normal in contour.  CARDIAC TTE (07/12/13): LV normal in size with moderate basal septal hypertrophy. EF 65-70%. Grade 2 diastolic dysfunction. LA & RA mildly dilated. RV mildly dilated with preserved systolic function. Pulmonary artery normal in size. RVSP 33 mmHg. No aortic stenosis or regurgitation. Aortic root nondilated. Mild mitral regurgitation without stenosis. Trivial pulmonic regurgitation. Mild tricuspid regurgitation. No pericardial effusion.  LABS  11/22/15 CBC: 6.2/15.1/44.6/172      Assessment & Plan:  70 y.o. male with ongoing cough. Patient's spirometry does show significant bronchodilator response that may indicate some airway reactivity from uncontrolled reflux especially in the setting of his symptoms. I reviewed his chest CT scan with him today which does show right middle lobe nodules up to 5 mm in size. Certainly these could be early malignancy but I suspect these are likely more inflammatory from reflux. I instructed the patient to contact my office if he had any further questions or concerns before his next appointment.  1. Cough/Possible Asthma: Suspect underlying reflux. Repeat spirometry with bronchodilator challenge and next appointment. 2. RML Nodules: Repeat CT chest without contrast October 2017. 3. GERD w/ Hiatal Hernia: Continuing Protonix & Zantac. No changes. Checking esophagogram. Referral  to GI depending upon results. 4. Health Maintenance: Recommended influenza vaccine in September. 5. Follow-up: Patient to return to clinic in October 2017  or sooner if needed.   Sonia Baller Ashok Cordia, M.D. [facility] Pulmonary & Critical Care Pager:  [phone] After 3pm or if no response, call [phone] 9:21 AM 04/20/16

## 2016-04-20 NOTE — Patient Instructions (Addendum)
   Continue taking your medications as prescribed.  We may have to send you to see a GI specialist depending on your Esophagram result.  I will see you back in October after your CT scan to follow-up on your lung nodules.  TESTS ORDERED: 1. Esophagram 2. CT Chest w/o October 2017 3. Spirometry with bronchodilator response at next appointment

## 2016-04-22 ENCOUNTER — Ambulatory Visit (HOSPITAL_COMMUNITY)
Admission: RE | Admit: 2016-04-22 | Discharge: 2016-04-22 | Disposition: A | Discharge status: Home / Self Care | Payer: Medicare Other | Source: Ambulatory Visit | Attending: Pulmonary Disease | Admitting: Pulmonary Disease

## 2016-04-22 DIAGNOSIS — K224 Dyskinesia of esophagus: Secondary | ICD-10-CM | POA: Insufficient documentation

## 2016-04-22 DIAGNOSIS — K219 Gastro-esophageal reflux disease without esophagitis: Secondary | ICD-10-CM | POA: Diagnosis not present

## 2016-04-22 DIAGNOSIS — R059 Cough, unspecified: Secondary | ICD-10-CM

## 2016-04-22 DIAGNOSIS — R05 Cough: Secondary | ICD-10-CM

## 2016-04-23 ENCOUNTER — Encounter: Payer: Self-pay | Admitting: Pulmonary Disease

## 2016-04-23 NOTE — Telephone Encounter (Signed)
JN - please advise on Esophogram results. Thanks.

## 2016-05-04 ENCOUNTER — Telehealth: Payer: Self-pay | Admitting: Cardiology

## 2016-05-04 ENCOUNTER — Encounter: Payer: Medicare Other | Admitting: *Deleted

## 2016-05-04 NOTE — Telephone Encounter (Signed)
Spoke with pt and reminded pt of remote transmission that is due today. Pt verbalized understanding.   

## 2016-05-08 ENCOUNTER — Encounter: Payer: Self-pay | Admitting: Cardiology

## 2016-05-08 ENCOUNTER — Ambulatory Visit (INDEPENDENT_AMBULATORY_CARE_PROVIDER_SITE_OTHER): Payer: Medicare Other | Admitting: *Deleted

## 2016-05-08 DIAGNOSIS — I422 Other hypertrophic cardiomyopathy: Secondary | ICD-10-CM | POA: Diagnosis not present

## 2016-05-08 DIAGNOSIS — I472 Ventricular tachycardia, unspecified: Secondary | ICD-10-CM

## 2016-05-08 NOTE — Progress Notes (Signed)
Letter  

## 2016-05-12 NOTE — Progress Notes (Signed)
Remote ICD transmission.   

## 2016-05-14 ENCOUNTER — Encounter: Payer: Self-pay | Admitting: Cardiology

## 2016-05-21 LAB — CUP PACEART REMOTE DEVICE CHECK
Battery Remaining Longevity: 66 mo
Battery Remaining Percentage: 71 %
Date Time Interrogation Session: 20170907094128
HighPow Impedance: 49 Ohm
Implantable Lead Location: 753860
Implantable Lead Model: 7121
Lead Channel Impedance Value: 460 Ohm
Lead Channel Sensing Intrinsic Amplitude: 4.3 mV
Lead Channel Setting Sensing Sensitivity: 0.5 mV
MDC IDC LEAD IMPLANT DT: 20141205
MDC IDC LEAD IMPLANT DT: 20141205
MDC IDC LEAD LOCATION: 753859
MDC IDC MSMT LEADCHNL RA IMPEDANCE VALUE: 450 Ohm
MDC IDC MSMT LEADCHNL RV SENSING INTR AMPL: 12 mV
MDC IDC PG SERIAL: 1129139
MDC IDC SET LEADCHNL RA PACING AMPLITUDE: 2 V
MDC IDC SET LEADCHNL RV PACING AMPLITUDE: 2.5 V
MDC IDC SET LEADCHNL RV PACING PULSEWIDTH: 0.5 ms
MDC IDC STAT BRADY RV PERCENT PACED: 1 % — AB

## 2016-05-26 ENCOUNTER — Other Ambulatory Visit: Payer: Self-pay

## 2016-05-27 ENCOUNTER — Encounter: Payer: Self-pay | Admitting: Internal Medicine

## 2016-05-28 ENCOUNTER — Telehealth: Payer: Self-pay | Admitting: *Deleted

## 2016-05-28 MED ORDER — METOPROLOL SUCCINATE ER 25 MG PO TB24: 25.0000 mg | ORAL_TABLET | Freq: Every day | ORAL | 3 refills | Status: DC

## 2016-05-28 NOTE — Telephone Encounter (Signed)
Patient left a msg on the refill vm stating that per [facility], atenolol is currently on backorder. He stated that he called yesterday and sent an email but has not received any response. He also said that [facility] claims to have been trying to reach the office since last week. He would like to know why this has not yet been handled as he has been without the medication for several days. Please advise. Thanks MI

## 2016-05-28 NOTE — Telephone Encounter (Signed)
Discussed with Amber and patient switched to Metoprolol Succ 25 mg daily.  Wife aware and will keep a watch on his BP

## 2016-06-05 DIAGNOSIS — R7301 Impaired fasting glucose: Secondary | ICD-10-CM | POA: Diagnosis not present

## 2016-06-05 DIAGNOSIS — E7800 Pure hypercholesterolemia, unspecified: Secondary | ICD-10-CM | POA: Diagnosis not present

## 2016-06-05 DIAGNOSIS — Z125 Encounter for screening for malignant neoplasm of prostate: Secondary | ICD-10-CM | POA: Diagnosis not present

## 2016-06-12 DIAGNOSIS — Z125 Encounter for screening for malignant neoplasm of prostate: Secondary | ICD-10-CM | POA: Diagnosis not present

## 2016-06-12 DIAGNOSIS — I480 Paroxysmal atrial fibrillation: Secondary | ICD-10-CM | POA: Diagnosis not present

## 2016-06-12 DIAGNOSIS — R7301 Impaired fasting glucose: Secondary | ICD-10-CM | POA: Diagnosis not present

## 2016-06-12 DIAGNOSIS — D692 Other nonthrombocytopenic purpura: Secondary | ICD-10-CM | POA: Diagnosis not present

## 2016-06-12 DIAGNOSIS — Z6826 Body mass index (BMI) 26.0-26.9, adult: Secondary | ICD-10-CM | POA: Diagnosis not present

## 2016-06-12 DIAGNOSIS — E781 Pure hyperglyceridemia: Secondary | ICD-10-CM | POA: Diagnosis not present

## 2016-06-12 DIAGNOSIS — K219 Gastro-esophageal reflux disease without esophagitis: Secondary | ICD-10-CM | POA: Diagnosis not present

## 2016-06-12 DIAGNOSIS — Z95810 Presence of automatic (implantable) cardiac defibrillator: Secondary | ICD-10-CM | POA: Diagnosis not present

## 2016-06-12 DIAGNOSIS — Z23 Encounter for immunization: Secondary | ICD-10-CM | POA: Diagnosis not present

## 2016-06-12 DIAGNOSIS — Z0000 Encounter for general adult medical examination without abnormal findings: Secondary | ICD-10-CM | POA: Diagnosis not present

## 2016-06-12 DIAGNOSIS — I421 Obstructive hypertrophic cardiomyopathy: Secondary | ICD-10-CM | POA: Diagnosis not present

## 2016-06-12 DIAGNOSIS — Z1389 Encounter for screening for other disorder: Secondary | ICD-10-CM | POA: Diagnosis not present

## 2016-06-17 ENCOUNTER — Ambulatory Visit (INDEPENDENT_AMBULATORY_CARE_PROVIDER_SITE_OTHER): Payer: Medicare Other | Admitting: Pulmonary Disease

## 2016-06-17 DIAGNOSIS — R05 Cough: Secondary | ICD-10-CM | POA: Diagnosis not present

## 2016-06-17 DIAGNOSIS — R059 Cough, unspecified: Secondary | ICD-10-CM

## 2016-06-17 LAB — PULMONARY FUNCTION TEST
FEF 25-75 POST: 1.95 L/s
FEF 25-75 PRE: 2.01 L/s
FEF2575-%Change-Post: -3 %
FEF2575-%PRED-PRE: 83 %
FEF2575-%Pred-Post: 80 %
FEV1-%Change-Post: 1 %
FEV1-%PRED-POST: 85 %
FEV1-%PRED-PRE: 84 %
FEV1-POST: 2.74 L
FEV1-Pre: 2.71 L
FEV1FVC-%Change-Post: 2 %
FEV1FVC-%PRED-PRE: 101 %
FEV6-%CHANGE-POST: -1 %
FEV6-%PRED-POST: 86 %
FEV6-%Pred-Pre: 87 %
FEV6-POST: 3.57 L
FEV6-Pre: 3.63 L
FEV6FVC-%CHANGE-POST: 0 %
FEV6FVC-%PRED-POST: 105 %
FEV6FVC-%Pred-Pre: 105 %
FVC-%Change-Post: -1 %
FVC-%PRED-POST: 81 %
FVC-%Pred-Pre: 83 %
FVC-Post: 3.59 L
FVC-Pre: 3.65 L
PRE FEV1/FVC RATIO: 74 %
PRE FEV6/FVC RATIO: 99 %
Post FEV1/FVC ratio: 76 %
Post FEV6/FVC ratio: 100 %

## 2016-06-17 NOTE — Progress Notes (Signed)
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 No hiatal hernia or mucosal abnormality. No reflux.

## 2016-06-19 ENCOUNTER — Ambulatory Visit: Payer: Medicare Other | Admitting: Pulmonary Disease

## 2016-06-24 ENCOUNTER — Ambulatory Visit (INDEPENDENT_AMBULATORY_CARE_PROVIDER_SITE_OTHER)
Admission: RE | Admit: 2016-06-24 | Discharge: 2016-06-24 | Disposition: A | Discharge status: Home / Self Care | Payer: Medicare Other | Source: Ambulatory Visit | Attending: Pulmonary Disease | Admitting: Pulmonary Disease

## 2016-06-24 DIAGNOSIS — R918 Other nonspecific abnormal finding of lung field: Secondary | ICD-10-CM

## 2016-06-30 ENCOUNTER — Encounter: Payer: Self-pay | Admitting: Pulmonary Disease

## 2016-06-30 ENCOUNTER — Ambulatory Visit (INDEPENDENT_AMBULATORY_CARE_PROVIDER_SITE_OTHER): Payer: Medicare Other | Admitting: Pulmonary Disease

## 2016-06-30 VITALS — BP 112/60 | HR 56 | Ht 70.0 in | Wt 180.6 lb

## 2016-06-30 DIAGNOSIS — R918 Other nonspecific abnormal finding of lung field: Secondary | ICD-10-CM

## 2016-06-30 DIAGNOSIS — R05 Cough: Secondary | ICD-10-CM

## 2016-06-30 DIAGNOSIS — K219 Gastro-esophageal reflux disease without esophagitis: Secondary | ICD-10-CM

## 2016-06-30 DIAGNOSIS — R059 Cough, unspecified: Secondary | ICD-10-CM

## 2016-06-30 NOTE — Progress Notes (Signed)
Subjective:    Patient ID: Joseph Morrow, male    DOB: Oct 29, 1945, 70 y.o.   MRN: [id number]  C.C.:  Follow-up for Cough/Possible Asthma, RML  Nodules, & GERD.  HPI Cough/Possible Asthma:  Significant bronchodilator response seen on spirometry without fixed airway obstruction. This has resolved on recent testing. Reports his cough doesn't seem to both him. His wife has reported continuing coughing but he reports it is decreasing.  RML Nodules: Seen on CT imaging July 2017. Largest measures 5 mm.Repeat CT scan this month shows no change in size of nodules in right middle lobe. The primary nodule is less dense.  GERD w/ Hiatal Hernia: Prescribed Protonix & Zantac. No reflux on Esophagram. He reports he quit taking Zantac since last appointment. He does have intermittent reflux & dyspepsia. No morning brash water taste. He admits he does eat spicy foods frequently. No morning brash water taste.  Review of Systems No chest pain or pressure. No fever, chills, or sweats. No rashes but does bruise easily.   No Known Allergies  Current Outpatient Prescriptions on File Prior to Visit  Medication Sig Dispense Refill  . aspirin 81 MG tablet Take 81 mg by mouth daily.      . Coenzyme Q10 (CO Q 10) 100 MG CAPS Take 1 capsule by mouth daily.     Marland Kitchen FOLIC ACID PO Take 1 tablet by mouth daily.     Marland Kitchen glucosamine-chondroitin 500-400 MG tablet Take 1 tablet by mouth daily.    . metoprolol succinate (TOPROL-XL) 25 MG 24 hr tablet Take 1 tablet (25 mg total) by mouth daily. Take with or immediately following a meal. 90 tablet 3  . pantoprazole (PROTONIX) 40 MG tablet TAKE 1/2 TABLET BY MOUTH DAILY  11  . Zinc 50 MG CAPS Take 1 capsule by mouth daily.      No current facility-administered medications on file prior to visit.     Past Medical History:  Diagnosis Date  . Acute cholecystitis 06/21/2011  . Allergy    SEASONAL  . Cataract   . GERD (gastroesophageal reflux disease)   . Hearing loss   .  Hiatal hernia   . Hypertrophic cardiomyopathy ([place])   . Mitral regurgitation    Moderate  . Paroxysmal atrial fibrillation ([place]) 04/2015   documented on ICD remote interrogation  . Septal defect    LV Septal hyptertrophy 71mm, normal EF, minimal LVOT gradiant.  . VT (ventricular tachycardia) ([place]) 08/18/2013   s/p St. Jude ICD    Past Surgical History:  Procedure Laterality Date  . APPENDECTOMY  1950  . CHOLECYSTECTOMY  06/24/2011  . COLONOSCOPY    . IMPLANTABLE CARDIOVERTER DEFIBRILLATOR IMPLANT  08/18/2013   St. Jude ICD, serial # [id number]  . IMPLANTABLE CARDIOVERTER DEFIBRILLATOR IMPLANT N/A 08/18/2013   Procedure: IMPLANTABLE CARDIOVERTER DEFIBRILLATOR IMPLANT;  Surgeon: Coralyn Mark, MD;  Location: [place] CATH LAB;  Service: Cardiovascular;  Laterality: N/A;    Family History  Problem Relation Age of Onset  . Hypertrophic cardiomyopathy Mother   . Arrhythmia Mother   . Heart failure Mother   . Pneumonia Father   . Diabetes Father   . Colon polyps Father   . Hypertrophic cardiomyopathy Brother   . Heart Problems Brother     VAD  . Heart attack Brother   . Arrhythmia Brother   . Diabetes Brother   . Sudden death Brother   . Lung disease Neg Hx     Social History   Social History  .  Marital status: Married    Spouse name: N/A  . Number of children: 2  . Years of education: N/A   Occupational History  . retired    Social History Main Topics  . Smoking status: Former Smoker    Packs/day: 2.00    Years: 20.00    Types: Cigarettes    Quit date: 07/06/1981  . Smokeless tobacco: Never Used  . Alcohol use 0.0 oz/week     Comment: 2 glasses per day  . Drug use: No  . Sexual activity: Not Asked   Other Topics Concern  . None   Social History Narrative   Originally from [place]. Moved to [place] in 2012. Currently has no pets. No bird or hot tub exposure. 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      Objective:   Physical Exam BP 112/60 (BP Location: Left Arm, Cuff Size: Normal)   Pulse (!) 56   Ht 5\' 10"  (1.778 m)   Wt 180 lb 9.6 oz (81.9 kg)   SpO2 100%   BMI 25.91 kg/m  General:  Comfortable. No distress. No distress. Integument:  Warm & dry. No rash on exposed skin.  Lymphatics:  No appreciated cervical or supraclavicular lymphadenoapthy. HEENT:  Moist mucus membranes. No oral ulcers. No nasal turbinate swelling or edema. Cardiovascular:  Regular rate and rhythm. Normal S1 & S2 Pulmonary: Speaking in complete sentences. Clear with auscultation. Normal work of breathing on room air. Abdomen: Soft. Normal bowel sounds. Nontender.  PFT 06/17/16: FVC 3.65 L (83%) FEV1 2.71 L (84%) FEV1/FVC 0.74 FEF 25-75 2.01 L (83%) negative bronchodilator response 04/17/16: FVC 3.46 L (78%) FEV1 2.49 L (77%) FEV1/FVC 0.72 FEF 25-75 1.93 L (79%) positive bronchodilator response TLC 6.36 L (90%) RV 113% ERV 62% DLCO uncorrected 80% (Hgb 15.7)  IMAGING CT CHEST W/O 06/24/16 (personally reviewed by me): No pathologic mediastinal adenopathy. No pericardial effusion. No pleural effusion or thickening. Cluster of nodules within right middle lobe unchanged does appear less solid than previously on imaging. Largest nodule measuring 5 mm.  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 No hiatal hernia or mucosal abnormality. No reflux.   CT CHEST W/O 03/20/16 (previously reviewed by me): Subcentimeter cluster of nodules right middle lobe. Largest nodule measures 5 mm. No evidence of or suggestion of bronchiectasis. No pleural effusion or thickening. No pericardial effusion. No pathologic mediastinal adenopathy.  CXR PA/LAT 01/20/16 (per radiologist): Heart normal in size. No edema, mass, or effusion. Pacemaker noted.  CXR PA/LAT 11/22/15 (previously reviewed by me): Mild flattening of the diaphragms suggestive of  hyperinflation. Implanted device noted in left upper chest with leads. No parenchymal nodule or opacity. No pleural effusion. Heart normal in size & mediastinum normal in contour.  CARDIAC TTE (07/12/13): LV normal in size with moderate basal septal hypertrophy. EF 65-70%. Grade 2 diastolic dysfunction. LA & RA mildly dilated. RV mildly dilated with preserved systolic function. Pulmonary artery normal in size. RVSP 33 mmHg. No aortic stenosis or regurgitation. Aortic root nondilated. Mild mitral regurgitation without stenosis. Trivial pulmonic regurgitation. Mild tricuspid regurgitation. No pericardial effusion.  LABS  11/22/15 CBC: 6.2/15.1/44.6/172    Assessment & Plan:  70 y.o. male with ongoing cough Likely secondary to his underlying reflux. I reviewed his chest CT scan with him today which shows no evidence of progression of his lung nodules but with his concern for  possible malignancy and his history we will repeat CT imaging at one year from his initial CT scan. I did caution the patient to monitor his reflux closely as he adjusts his regimen. I instructed the patient to notify me if he had any new breathing problems or worsening in his cough before his next appointment.  1. Cough: Likely secondary to reflux. No new medications at this time. No further testing at this time. 2. RML Nodules:  Repeat CT chest w/o July 2018.  3. GERD w/ Hiatal Hernia: Continuing Protonix. Patient instructed to monitor reflux symptoms.  4. Health Maintenance: Influenza Vaccine September 2017. 5. Follow-up: Patient to return to clinic in July 2018 or sooner if needed.  Sonia Baller Ashok Cordia, M.D. [facility] Pulmonary & Critical Care Pager:  [phone] After 3pm or if no response, call [phone] 11:01 AM 06/30/16

## 2016-06-30 NOTE — Patient Instructions (Signed)
   Call me if your cough gets worse or you have any new breathing problems before your next appointment.  Pay attention to your reflux because this can make your cough worse.  I will see you back after your CT scan in July 2018.  TESTS ORDERED: 1. CT Chest w/o July 2018

## 2016-07-03 DIAGNOSIS — Z1212 Encounter for screening for malignant neoplasm of rectum: Secondary | ICD-10-CM | POA: Diagnosis not present

## 2016-08-11 ENCOUNTER — Ambulatory Visit (INDEPENDENT_AMBULATORY_CARE_PROVIDER_SITE_OTHER): Payer: Medicare Other | Admitting: *Deleted

## 2016-08-11 DIAGNOSIS — I422 Other hypertrophic cardiomyopathy: Secondary | ICD-10-CM | POA: Diagnosis not present

## 2016-08-12 NOTE — Progress Notes (Signed)
Remote ICD transmission.   

## 2016-08-13 ENCOUNTER — Encounter: Payer: Self-pay | Admitting: Cardiology

## 2016-08-14 DIAGNOSIS — Z95810 Presence of automatic (implantable) cardiac defibrillator: Secondary | ICD-10-CM

## 2016-08-14 HISTORY — DX: Presence of automatic (implantable) cardiac defibrillator: Z95.810

## 2016-09-02 ENCOUNTER — Telehealth: Payer: Self-pay | Admitting: Nurse Practitioner

## 2016-09-02 NOTE — Telephone Encounter (Signed)
Received alert for premature battery depletion on [employer].  Left message for patient to call back and ask for Janan Halter, RN. Recommendation from [facility] would be to replace generator tomorrow. Need to discuss with patient further.  Please route call to [facility] when patient calls back.   Chanetta Marshall, NP 09/02/2016 9:46 AM

## 2016-09-03 ENCOUNTER — Ambulatory Visit (HOSPITAL_COMMUNITY)
Admission: RE | Admit: 2016-09-03 | Discharge: 2016-09-03 | Disposition: A | Discharge status: Home / Self Care | Payer: Medicare Other | Source: Ambulatory Visit | Attending: Internal Medicine | Admitting: Internal Medicine

## 2016-09-03 ENCOUNTER — Encounter (HOSPITAL_COMMUNITY)
Admission: RE | Disposition: A | Discharge status: Home / Self Care | Payer: Self-pay | Source: Ambulatory Visit | Attending: Internal Medicine

## 2016-09-03 ENCOUNTER — Other Ambulatory Visit: Payer: Self-pay | Admitting: Nurse Practitioner

## 2016-09-03 DIAGNOSIS — Z8249 Family history of ischemic heart disease and other diseases of the circulatory system: Secondary | ICD-10-CM | POA: Diagnosis not present

## 2016-09-03 DIAGNOSIS — Z87891 Personal history of nicotine dependence: Secondary | ICD-10-CM | POA: Insufficient documentation

## 2016-09-03 DIAGNOSIS — Z833 Family history of diabetes mellitus: Secondary | ICD-10-CM | POA: Insufficient documentation

## 2016-09-03 DIAGNOSIS — Z4502 Encounter for adjustment and management of automatic implantable cardiac defibrillator: Secondary | ICD-10-CM

## 2016-09-03 DIAGNOSIS — I340 Nonrheumatic mitral (valve) insufficiency: Secondary | ICD-10-CM | POA: Insufficient documentation

## 2016-09-03 DIAGNOSIS — I472 Ventricular tachycardia, unspecified: Secondary | ICD-10-CM

## 2016-09-03 DIAGNOSIS — K449 Diaphragmatic hernia without obstruction or gangrene: Secondary | ICD-10-CM | POA: Diagnosis not present

## 2016-09-03 DIAGNOSIS — I422 Other hypertrophic cardiomyopathy: Secondary | ICD-10-CM | POA: Insufficient documentation

## 2016-09-03 DIAGNOSIS — Z7982 Long term (current) use of aspirin: Secondary | ICD-10-CM | POA: Insufficient documentation

## 2016-09-03 DIAGNOSIS — Z95810 Presence of automatic (implantable) cardiac defibrillator: Secondary | ICD-10-CM | POA: Diagnosis present

## 2016-09-03 DIAGNOSIS — I480 Paroxysmal atrial fibrillation: Secondary | ICD-10-CM | POA: Diagnosis not present

## 2016-09-03 DIAGNOSIS — Z8371 Family history of colonic polyps: Secondary | ICD-10-CM | POA: Diagnosis not present

## 2016-09-03 DIAGNOSIS — K219 Gastro-esophageal reflux disease without esophagitis: Secondary | ICD-10-CM | POA: Insufficient documentation

## 2016-09-03 HISTORY — PX: EP IMPLANTABLE DEVICE: SHX172B

## 2016-09-03 LAB — BASIC METABOLIC PANEL
ANION GAP: 9 (ref 5–15)
BUN: 13 mg/dL (ref 6–20)
CHLORIDE: 103 mmol/L (ref 101–111)
CO2: 29 mmol/L (ref 22–32)
Calcium: 9.3 mg/dL (ref 8.9–10.3)
Creatinine, Ser: 1.27 mg/dL — ABNORMAL HIGH (ref 0.61–1.24)
GFR calc Af Amer: >60 mL/min (ref >60)
GFR, EST NON AFRICAN AMERICAN: 55 mL/min — AB (ref >60)
GLUCOSE: 95 mg/dL (ref 65–99)
POTASSIUM: 4.5 mmol/L (ref 3.5–5.1)
Sodium: 141 mmol/L (ref 135–145)

## 2016-09-03 LAB — CBC
HEMATOCRIT: 44.2 % (ref 39.0–52.0)
HEMOGLOBIN: 15.3 g/dL (ref 13.0–17.0)
MCH: 30.7 pg (ref 26.0–34.0)
MCHC: 34.6 g/dL (ref 30.0–36.0)
MCV: 88.8 fL (ref 78.0–100.0)
Platelets: 179 10*3/uL (ref 150–400)
RBC: 4.98 MIL/uL (ref 4.22–5.81)
RDW: 13 % (ref 11.5–15.5)
WBC: 6.1 10*3/uL (ref 4.0–10.5)

## 2016-09-03 LAB — SURGICAL PCR SCREEN
MRSA, PCR: NEGATIVE
STAPHYLOCOCCUS AUREUS: NEGATIVE

## 2016-09-03 SURGERY — ICD GENERATOR CHANGEOUT

## 2016-09-03 MED ORDER — ACETAMINOPHEN 325 MG PO TABS
325.0000 mg | ORAL_TABLET | ORAL | Status: DC | PRN
  Filled 2016-09-03: qty 2

## 2016-09-03 MED ORDER — SODIUM CHLORIDE 0.9 % IV SOLN: 250.0000 mL | INTRAVENOUS | Status: DC | PRN

## 2016-09-03 MED ORDER — SODIUM CHLORIDE 0.9 % IR SOLN
80.0000 mg | Status: AC
  Administered 2016-09-03: 80 mg
  Filled 2016-09-03: qty 2

## 2016-09-03 MED ORDER — MUPIROCIN 2 % EX OINT
TOPICAL_OINTMENT | CUTANEOUS | Status: AC
  Administered 2016-09-03: 1 via TOPICAL
  Filled 2016-09-03: qty 22

## 2016-09-03 MED ORDER — CHLORHEXIDINE GLUCONATE 4 % EX LIQD: 60.0000 mL | Freq: Once | CUTANEOUS | Status: DC

## 2016-09-03 MED ORDER — SODIUM CHLORIDE 0.9% FLUSH: 3.0000 mL | INTRAVENOUS | Status: DC | PRN

## 2016-09-03 MED ORDER — SODIUM CHLORIDE 0.9 % IV SOLN
INTRAVENOUS | Status: DC
  Administered 2016-09-03: 14:00:00 via INTRAVENOUS

## 2016-09-03 MED ORDER — MUPIROCIN 2 % EX OINT
1.0000 "application " | TOPICAL_OINTMENT | Freq: Once | CUTANEOUS | Status: AC
  Administered 2016-09-03: 1 via TOPICAL

## 2016-09-03 MED ORDER — CEFAZOLIN SODIUM-DEXTROSE 2-4 GM/100ML-% IV SOLN
2.0000 g | INTRAVENOUS | Status: AC
  Administered 2016-09-03: 2 g via INTRAVENOUS
  Filled 2016-09-03: qty 100

## 2016-09-03 MED ORDER — SODIUM CHLORIDE 0.9 % IR SOLN
Status: AC
  Filled 2016-09-03: qty 2

## 2016-09-03 MED ORDER — ONDANSETRON HCL 4 MG/2ML IJ SOLN: 4.0000 mg | Freq: Four times a day (QID) | INTRAMUSCULAR | Status: DC | PRN

## 2016-09-03 MED ORDER — SODIUM CHLORIDE 0.9% FLUSH: 3.0000 mL | Freq: Two times a day (BID) | INTRAVENOUS | Status: DC

## 2016-09-03 MED ORDER — LIDOCAINE HCL (PF) 1 % IJ SOLN
INTRAMUSCULAR | Status: AC
  Filled 2016-09-03: qty 30

## 2016-09-03 MED ORDER — CEFAZOLIN SODIUM-DEXTROSE 2-4 GM/100ML-% IV SOLN
INTRAVENOUS | Status: AC
  Filled 2016-09-03: qty 100

## 2016-09-03 MED ORDER — LIDOCAINE HCL (PF) 1 % IJ SOLN
INTRAMUSCULAR | Status: DC | PRN
  Administered 2016-09-03: 25 mL via INTRADERMAL

## 2016-09-03 SURGICAL SUPPLY — 4 items
CABLE SURGICAL S-101-97-12 (CABLE) ×2 IMPLANT
ICD ASSURA DR CD2357-40Q (ICD Generator) ×2 IMPLANT
PAD DEFIB LIFELINK (PAD) ×2 IMPLANT
TRAY PACEMAKER INSERTION (PACKS) ×2 IMPLANT

## 2016-09-03 NOTE — Discharge Instructions (Signed)
Keep incision clean and dry for 10 days. No driving for 2 days.  You can remove outer dressing tomorrow. Leave steri-strips (little pieces of tape) on until seen in the office for wound check appointment. Call the office [phone]) for redness, drainage, swelling, or fever.    Pacemaker Battery Change, Care After Refer to this sheet in the next few weeks. These instructions provide you with information on caring for yourself after your procedure. Your health care provider may also give you more specific instructions. Your treatment has been planned according to current medical practices, but problems sometimes occur. Call your health care provider if you have any problems or questions after your procedure. WHAT TO EXPECT AFTER THE PROCEDURE After your procedure, it is typical to have the following sensations:  Soreness at the pacemaker site. HOME CARE INSTRUCTIONS   Keep the incision clean and dry (see above)  For the first week after the replacement, avoid stretching motions that pull at the incision site, and avoid heavy exercise with the arm that is on the same side as the incision.  Take medicines only as directed by your health care provider.  Keep all follow-up visits as directed by your health care provider. SEEK MEDICAL CARE IF:   You have pain at the incision site that is not relieved by over-the-counter or prescription medicine.  There is drainage or pus from the incision site.  There is swelling larger than a lime at the incision site.  You develop red streaking that extends above or below the incision site.  You feel brief, intermittent palpitations, light-headedness, or any symptoms that you feel might be related to your heart. SEEK IMMEDIATE MEDICAL CARE IF:   You experience chest pain that is different than the pain at the pacemaker site.  You experience shortness of breath.  You have palpitations or irregular heartbeat.  You have light-headedness that does  not go away quickly.  You faint.  You have pain that gets worse and is not relieved by medicine. This information is not intended to replace advice given to you by your health care provider. Make sure you discuss any questions you have with your health care provider. Document Released: 06/21/2013 Document Revised: 09/21/2014 Document Reviewed: 06/21/2013 Elsevier Interactive Patient Education  2017 [employer].

## 2016-09-03 NOTE — Telephone Encounter (Signed)
Received full transmission from patient.  Device with battery performance alert. With prior appropriate therapy, recommendation is urgent generator change. I have arranged for this afternoon with Dr Rayann Heman. Pt ate breakfast, advised to not eat lunch.  Orders placed. Short stay, EP lab, Trish aware.  Chanetta Marshall, NP 09/03/2016 10:39 AM

## 2016-09-03 NOTE — H&P (Signed)
ELECTROPHYSIOLOGY CONSULT NOTE    Patient ID: Joseph Morrow MRN: [id number], DOB/AGE: May 12, 1946 70 y.o.  Admit date: 09/03/2016   Primary Physician: Marton Redwood, MD Primary Cardiologist: Dr. Marlou Porch Electrophysiologist: Dr. Rayann Heman  Reason for Visit: ICD generator change  HPI: Joseph Morrow is a 70 y.o. male with PMHx of HCM, VT w/ICD, PAFib (CHA2DS2Vasc is 1 for age, not on a/c) received a vibratory alert from his device that was due to a battery performance alert, given his hx of VT and appropriate therapy, decision to do generator change urgently was decided.   He has been feeling well, had a "stomach bug" a few weeks ago but over it.  No CP, palpitations or SOB, no fever, N/V/D.  Device information: SJM dual chamber ICD, implanted 08/18/13, Dr. Rayann Heman + hx of VT  Past Medical History:  Diagnosis Date  . Acute cholecystitis 06/21/2011  . Allergy    SEASONAL  . Cataract   . GERD (gastroesophageal reflux disease)   . Hearing loss   . Hiatal hernia   . Hypertrophic cardiomyopathy ([place])   . Mitral regurgitation    Moderate  . Paroxysmal atrial fibrillation ([place]) 04/2015   documented on ICD remote interrogation  . Septal defect    LV Septal hyptertrophy 75mm, normal EF, minimal LVOT gradiant.  . VT (ventricular tachycardia) ([place]) 08/18/2013   s/p St. Jude ICD     Surgical History:  Past Surgical History:  Procedure Laterality Date  . APPENDECTOMY  1950  . CHOLECYSTECTOMY  06/24/2011  . COLONOSCOPY    . IMPLANTABLE CARDIOVERTER DEFIBRILLATOR IMPLANT  08/18/2013   St. Jude ICD, serial # [id number]  . IMPLANTABLE CARDIOVERTER DEFIBRILLATOR IMPLANT N/A 08/18/2013   Procedure: IMPLANTABLE CARDIOVERTER DEFIBRILLATOR IMPLANT;  Surgeon: Coralyn Mark, MD;  Location: [place] CATH LAB;  Service: Cardiovascular;  Laterality: N/A;     Prescriptions Prior to Admission  Medication Sig Dispense Refill Last Dose  . acidophilus (RISAQUAD) CAPS capsule Take 1 capsule by mouth  daily.   09/03/2016 at Unknown time  . aspirin 81 MG tablet Take 81 mg by mouth daily.     09/03/2016 at Unknown time  . atenolol (TENORMIN) 50 MG tablet Take 50 mg by mouth daily.  0 09/03/2016 at 0800  . Coenzyme Q10 (CO Q 10) 100 MG CAPS Take 1 capsule by mouth daily.    09/03/2016 at Unknown time  . FOLIC ACID PO Take 1 tablet by mouth daily.    09/03/2016 at Unknown time  . glucosamine-chondroitin 500-400 MG tablet Take 1 tablet by mouth daily.   09/03/2016 at Unknown time  . Zinc 50 MG CAPS Take 1 capsule by mouth daily.    09/03/2016 at Unknown time  . metoprolol succinate (TOPROL-XL) 25 MG 24 hr tablet Take 1 tablet (25 mg total) by mouth daily. Take with or immediately following a meal. 90 tablet 3 Taking  . pantoprazole (PROTONIX) 40 MG tablet TAKE 1/2 TABLET BY MOUTH DAILY  11 Taking    Inpatient Medications:  .  ceFAZolin (ANCEF) IV  2 g Intravenous On Call  . chlorhexidine  60 mL Topical Once  . gentamicin irrigation  80 mg Irrigation On Call    Allergies:  Allergies  Allergen Reactions  . Antihistamines, Diphenhydramine-Type Other (See Comments)    "nervous"  . Fentanyl Other (See Comments)    "headaches"  . Versed [Midazolam] Other (See Comments)    "hallucinations"    Social History   Social History  . Marital status: Married  Spouse name: N/A  . Number of children: 2  . Years of education: N/A   Occupational History  . retired    Social History Main Topics  . Smoking status: Former Smoker    Packs/day: 2.00    Years: 20.00    Types: Cigarettes    Quit date: 07/06/1981  . Smokeless tobacco: Never Used  . Alcohol use 0.0 oz/week     Comment: 2 glasses per day  . Drug use: No  . Sexual activity: Not on file   Other Topics Concern  . Not on file   Social History Narrative   Originally from [place]. Moved to [place] in 2012. Currently has no pets. No bird or hot tub exposure. 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  He did have exposure to Beneze, Acetone, Styrene, & Hydrocarbon early in his career.     Family History  Problem Relation Age of Onset  . Hypertrophic cardiomyopathy Mother   . Arrhythmia Mother   . Heart failure Mother   . Pneumonia Father   . Diabetes Father   . Colon polyps Father   . Hypertrophic cardiomyopathy Brother   . Heart Problems Brother     VAD  . Heart attack Brother   . Arrhythmia Brother   . Diabetes Brother   . Sudden death Brother   . Lung disease Neg Hx      Review of Systems: All other systems reviewed and are otherwise negative except as noted above.  Physical Exam: Vitals:   09/03/16 1335  BP: 129/88  Pulse: (!) 50  Resp: 20  Temp: 99.1 F (37.3 C)  TempSrc: Oral  SpO2: 100%  Weight: 170 lb (77.1 kg)  Height: 5\' 10"  (1.778 m)    GEN- The patient is well appearing, alert and oriented x 3 today.   HEENT: normocephalic, atraumatic; sclera clear, conjunctiva pink; hearing intact; oropharynx clear; neck supple, no JVP Lymph- no cervical lymphadenopathy Lungs- Clear to ausculation bilaterally, normal work of breathing.  No wheezes, rales, rhonchi Heart- Regular rate and rhythm, no murmurs, rubs or gallops, PMI not laterally displaced GI- soft, non-tender, non-distended Extremities- no clubbing, cyanosis, or edema MS- no significant deformity or atrophy Skin- warm and dry, no rash or lesion Psych- euthymic mood, full affect Neuro- no gross deficits observed     Assessment and Plan:   1.  HCM/ Moderate MR 2. VT hx ICD alert for battery performance was received  Plan for ICD generator change today with Dr. Rayann Heman Risks benefits of the procedure d/w the patient, he would like to proceed Wound care discussed with the patient Wound check visit arranged    Signed, Tommye Standard, PA-C 09/03/2016 1:56 PM  Risks, benefits, and alternatives to ICD pulse generator replacement were discussed in detail today.  The patient understands that risks  include but are not limited to bleeding, infection, pneumothorax, perforation, tamponade, vascular damage, renal failure, MI, stroke, death, inappropriate shocks, damage to his existing leads, and lead dislodgement and wishes to proceed.   [name], [facility] 09/03/2016 3:27 PM

## 2016-09-03 NOTE — Interval H&P Note (Signed)
History and Physical Interval Note:  09/03/2016 3:28 PM  Joseph Morrow  has presented today for surgery, with the diagnosis of eri  The various methods of treatment have been discussed with the patient and family. After consideration of risks, benefits and other options for treatment, the patient has consented to  Procedure(s): ICD [employer] (N/A) as a surgical intervention .  The patient's history has been reviewed, patient examined, no change in status, stable for surgery.  I have reviewed the patient's chart and labs.  Questions were answered to the patient's satisfaction.     ICD Criteria  Current LVEF:60%. Within 12 months prior to implant: No   Heart failure history: No  Cardiomyopathy history: hypertrophic cardiomyopathy  Atrial Fibrillation/Atrial Flutter: Yes, Paroxysmal.  Ventricular tachycardia history: Yes, Hemodynamic instability present. VT Type: Sustained Ventricular Tachycardia - Monomorphic.  Cardiac arrest history: No.  History of syndromes with risk of sudden death: Yes, Other  Hypertrophic cardiomyopathy  Previous ICD: Yes, Reason for ICD:  Secondary prevention.  Current ICD indication: Secondary  PPM indication: Yes. Pacing type: Atrial. Greater than 40% RV pacing requirement anticipated. Indication: Sick Sinus Syndrome   Class I or II Bradycardia indication present: Yes  Beta Blocker therapy for 3 or more months: Yes, prescribed.   Ace Inhibitor/ARB therapy for 3 or more months: No, medical reason.   EF normal.  Ace not indicated    [name]

## 2016-09-04 ENCOUNTER — Encounter (HOSPITAL_COMMUNITY): Payer: Self-pay | Admitting: Cardiology

## 2016-09-09 LAB — CUP PACEART REMOTE DEVICE CHECK
Battery Remaining Longevity: 71 mo
Battery Remaining Percentage: 69 %
Battery Voltage: 2.99 V
Brady Statistic AS VS Percent: 78 %
Date Time Interrogation Session: 20171128090018
HIGH POWER IMPEDANCE MEASURED VALUE: 49 Ohm
HIGH POWER IMPEDANCE MEASURED VALUE: 50 Ohm
Lead Channel Impedance Value: 480 Ohm
Lead Channel Pacing Threshold Pulse Width: 0.5 ms
Lead Channel Sensing Intrinsic Amplitude: 12 mV
Lead Channel Setting Pacing Amplitude: 2 V
Lead Channel Setting Sensing Sensitivity: 0.5 mV
MDC IDC MSMT LEADCHNL RA IMPEDANCE VALUE: 480 Ohm
MDC IDC MSMT LEADCHNL RA PACING THRESHOLD AMPLITUDE: 1 V
MDC IDC MSMT LEADCHNL RA SENSING INTR AMPL: 5 mV
MDC IDC MSMT LEADCHNL RV PACING THRESHOLD AMPLITUDE: 0.5 V
MDC IDC MSMT LEADCHNL RV PACING THRESHOLD PULSEWIDTH: 0.5 ms
MDC IDC PG IMPLANT DT: 20141205
MDC IDC PG SERIAL: 1129139
MDC IDC SET LEADCHNL RV PACING AMPLITUDE: 2.5 V
MDC IDC SET LEADCHNL RV PACING PULSEWIDTH: 0.5 ms
MDC IDC STAT BRADY AP VP PERCENT: 1 %
MDC IDC STAT BRADY AP VS PERCENT: 20 %
MDC IDC STAT BRADY AS VP PERCENT: 1 %
MDC IDC STAT BRADY RA PERCENT PACED: 19 %
MDC IDC STAT BRADY RV PERCENT PACED: 1 %

## 2016-09-10 ENCOUNTER — Telehealth: Payer: Self-pay | Admitting: Cardiology

## 2016-09-10 NOTE — Telephone Encounter (Signed)
Informed pt scheduler was calling to confirm wound check appt on 09/15/16 @ 9:30 am. Patient verbalized understanding and agreeable to plan.

## 2016-09-10 NOTE — Telephone Encounter (Signed)
New Message   Patient returning phone call. Wasn't sure who called or what it was about. Pt of Dr. Marlou Porch

## 2016-09-15 ENCOUNTER — Ambulatory Visit (INDEPENDENT_AMBULATORY_CARE_PROVIDER_SITE_OTHER): Payer: Medicare Other | Admitting: *Deleted

## 2016-09-15 DIAGNOSIS — Z95810 Presence of automatic (implantable) cardiac defibrillator: Secondary | ICD-10-CM

## 2016-09-15 DIAGNOSIS — I472 Ventricular tachycardia, unspecified: Secondary | ICD-10-CM

## 2016-09-15 LAB — CUP PACEART INCLINIC DEVICE CHECK
HighPow Impedance: 49.1398
Implantable Lead Implant Date: 20141205
Implantable Lead Location: 753860
Implantable Lead Model: 7121
Implantable Pulse Generator Implant Date: 20171221
Lead Channel Pacing Threshold Amplitude: 0.5 V
Lead Channel Pacing Threshold Pulse Width: 0.5 ms
Lead Channel Sensing Intrinsic Amplitude: 12 mV
Lead Channel Sensing Intrinsic Amplitude: 4.6 mV
Lead Channel Setting Pacing Amplitude: 2 V
Lead Channel Setting Pacing Pulse Width: 0.5 ms
Lead Channel Setting Sensing Sensitivity: 0.5 mV
MDC IDC LEAD IMPLANT DT: 20141205
MDC IDC LEAD LOCATION: 753859
MDC IDC MSMT LEADCHNL RA IMPEDANCE VALUE: 450 Ohm
MDC IDC MSMT LEADCHNL RA PACING THRESHOLD AMPLITUDE: 1 V
MDC IDC MSMT LEADCHNL RA PACING THRESHOLD PULSEWIDTH: 0.5 ms
MDC IDC MSMT LEADCHNL RV IMPEDANCE VALUE: 475 Ohm
MDC IDC PG SERIAL: 7387049
MDC IDC SESS DTM: 20180102144416
MDC IDC SET LEADCHNL RV PACING AMPLITUDE: 2.5 V
MDC IDC STAT BRADY RA PERCENT PACED: 38 %
MDC IDC STAT BRADY RV PERCENT PACED: 0.63 %

## 2016-09-15 NOTE — Progress Notes (Signed)
Wound check appointment. Steri-strips removed. Wound without redness or edema. Pinhole opening noted on R lateral incision. No stitch noted, applied antibiotic ointment and bandaid. Instructed pt to keep covered with ointment and bandaid for 24hrs, then leave open to air. Incision edges otherwise approximated, wound well healed. Pt verbalizes understanding of instructions to call if he develops any signs/symptoms of infection.   Normal device function. Thresholds, sensing, and impedances consistent with implant measurements. Device programmed at chronic outputs. Histogram distribution appropriate for patient and level of activity. 1 mode switch (<1%), AT, duration 8sec. No ventricular arrhythmias noted. Patient educated about wound care, arm mobility, lifting restrictions, shock plan. ROV with JA on 12/09/16.

## 2016-09-28 ENCOUNTER — Telehealth: Payer: Self-pay | Admitting: *Deleted

## 2016-09-28 NOTE — Telephone Encounter (Signed)
Spoke with patient regarding ICD wound follow-up from 09/15/16.  Patient states he feels his wound site is fully healed, but he is agreeable to wound recheck appointment on 09/29/16 at 3:00pm.  He denies drainage, swelling, fever, or chills.  Patient is appreciative of call and denies additional questions or concerns at this time.

## 2016-09-29 ENCOUNTER — Ambulatory Visit (INDEPENDENT_AMBULATORY_CARE_PROVIDER_SITE_OTHER): Payer: Medicare Other | Admitting: *Deleted

## 2016-09-29 DIAGNOSIS — Z95810 Presence of automatic (implantable) cardiac defibrillator: Secondary | ICD-10-CM

## 2016-09-29 NOTE — Progress Notes (Signed)
Wound recheck appointment.  Site without redness or edema.  Incision well healed.  ROV with Dr. Rayann Heman as scheduled on 12/09/16.

## 2016-12-09 ENCOUNTER — Encounter: Payer: Self-pay | Admitting: Internal Medicine

## 2016-12-09 ENCOUNTER — Ambulatory Visit (INDEPENDENT_AMBULATORY_CARE_PROVIDER_SITE_OTHER): Payer: Medicare Other | Admitting: Internal Medicine

## 2016-12-09 VITALS — BP 130/70 | HR 50 | Ht 70.0 in | Wt 175.0 lb

## 2016-12-09 DIAGNOSIS — I472 Ventricular tachycardia, unspecified: Secondary | ICD-10-CM

## 2016-12-09 MED ORDER — ATENOLOL 25 MG PO TABS: 25.0000 mg | ORAL_TABLET | Freq: Every day | ORAL | 3 refills | Status: DC

## 2016-12-09 NOTE — Progress Notes (Signed)
PCP: Marton Redwood, MD Primary Cardiologist: Keylor Rands is a 71 y.o. male who presents today for routine electrophysiology followup.  He is doing very well since his recent generator change.  No symptoms of CHF or arrhythmia. Today, he denies symptoms of palpitations, chest pain, shortness of breath,  lower extremity edema, dizziness, presyncope, syncope, or ICD shocks.  The patient is otherwise without complaint today.   Past Medical History:  Diagnosis Date  . Acute cholecystitis 06/21/2011  . Allergy    SEASONAL  . Cataract   . GERD (gastroesophageal reflux disease)   . Hearing loss   . Hiatal hernia   . Hypertrophic cardiomyopathy ([place])   . Mitral regurgitation    Moderate  . Paroxysmal atrial fibrillation ([place]) 04/2015   documented on ICD remote interrogation  . Septal defect    LV Septal hyptertrophy 48mm, normal EF, minimal LVOT gradiant.  . VT (ventricular tachycardia) ([place]) 08/18/2013   s/p St. Jude ICD   Past Surgical History:  Procedure Laterality Date  . APPENDECTOMY  1950  . CHOLECYSTECTOMY  06/24/2011  . COLONOSCOPY    . EP IMPLANTABLE DEVICE N/A 09/03/2016   SJM Fortify Assura Dr generator change by Dr Rayann Heman.  ICD implanted for secondary prevention of sudden death  . IMPLANTABLE CARDIOVERTER DEFIBRILLATOR IMPLANT  08/18/2013   St. Jude ICD, serial # [id number]  . IMPLANTABLE CARDIOVERTER DEFIBRILLATOR IMPLANT N/A 08/18/2013   Procedure: IMPLANTABLE CARDIOVERTER DEFIBRILLATOR IMPLANT;  Surgeon: Coralyn Mark, MD;  Location: [facility] CATH LAB;  Service: Cardiovascular;  Laterality: N/A;    Current Outpatient Prescriptions  Medication Sig Dispense Refill  . acidophilus (RISAQUAD) CAPS capsule Take 1 capsule by mouth daily.    Marland Kitchen aspirin 81 MG tablet Take 81 mg by mouth daily.      . Coenzyme Q10 (CO Q 10) 100 MG CAPS Take 100 mg by mouth daily.     . folic acid (FOLVITE) 1 MG tablet Take 1 mg by mouth daily.     Marland Kitchen glucosamine-chondroitin 500-400 MG tablet  Take 1 tablet by mouth daily.    . pantoprazole (PROTONIX) 40 MG tablet Take 40 mg by mouth daily.    . Zinc 50 MG CAPS Take 50 mg by mouth daily.     Marland Kitchen atenolol (TENORMIN) 25 MG tablet Take 1 tablet (25 mg total) by mouth daily. 90 tablet 3   No current facility-administered medications for this visit.     Physical Exam: Vitals:   12/09/16 1229  BP: 130/70  Pulse: (!) 50  SpO2: 98%  Weight: 175 lb (79.4 kg)  Height: 5\' 10"  (1.778 m)    GEN- The patient is well appearing, alert and oriented x 3 today.   Head- normocephalic, atraumatic Eyes-  Sclera clear, conjunctiva pink Ears- hearing intact Oropharynx- clear Lungs- Clear to ausculation bilaterally, normal work of breathing Chest- ICD pocket is well healed Heart- Regular rate and rhythm  GI- soft, NT, ND, + BS Extremities- no clubbing, cyanosis, or edema  ICD interrogation- personally reviewed in detail today,  See PACEART report  ekg today reveals sinus rhythm 50 bpm, PR 250 msec, otherwise normal ekg  Assessment and Plan:  1.  HCM/ Moderate MR Doing well at this time, without symptoms  2. VT Normal ICD function See Pace Art report No changes today  Merlin Return in 1 year Follow-up with Dr Marlou Porch as scheduled  [name], [facility] 12/09/2016 5:32 PM

## 2016-12-09 NOTE — Patient Instructions (Signed)
Medication Instructions:  Your physician has recommended you make the following change in your medication:  1) Decrease Atenolol to 25 mg once dialy    Labwork: None ordered   Testing/Procedures: None ordered   Follow-Up: Your physician wants you to follow-up in: 12 months with Dr. Rayann Heman.  You will receive a reminder letter in the mail two months in advance. If you don't receive a letter, please call our office to schedule the follow-up appointment.  Remote monitoring is used to monitor your ICD from home. This monitoring reduces the number of office visits required to check your device to one time per year. It allows [place] to keep an eye on the functioning of your device to ensure it is working properly. You are scheduled for a device check from home on 03/10/17. You may send your transmission at any time that day. If you have a wireless device, the transmission will be sent automatically. After your physician reviews your transmission, you will receive a postcard with your next transmission date.     Any Other Special Instructions Will Be Listed Below (If Applicable).     If you need a refill on your cardiac medications before your next appointment, please call your pharmacy.

## 2016-12-18 LAB — CUP PACEART INCLINIC DEVICE CHECK
Brady Statistic RV Percent Paced: 0.55 %
HIGH POWER IMPEDANCE MEASURED VALUE: 46.6198
Implantable Lead Implant Date: 20141205
Implantable Lead Location: 753860
Lead Channel Impedance Value: 475 Ohm
Lead Channel Pacing Threshold Amplitude: 1 V
Lead Channel Pacing Threshold Pulse Width: 0.5 ms
Lead Channel Sensing Intrinsic Amplitude: 12 mV
Lead Channel Setting Pacing Amplitude: 2 V
Lead Channel Setting Pacing Amplitude: 2.5 V
Lead Channel Setting Pacing Pulse Width: 0.5 ms
Lead Channel Setting Sensing Sensitivity: 0.5 mV
MDC IDC LEAD IMPLANT DT: 20141205
MDC IDC LEAD LOCATION: 753859
MDC IDC MSMT LEADCHNL RA IMPEDANCE VALUE: 425 Ohm
MDC IDC MSMT LEADCHNL RA PACING THRESHOLD AMPLITUDE: 1 V
MDC IDC MSMT LEADCHNL RA PACING THRESHOLD PULSEWIDTH: 0.5 ms
MDC IDC MSMT LEADCHNL RA SENSING INTR AMPL: 5 mV
MDC IDC MSMT LEADCHNL RV PACING THRESHOLD AMPLITUDE: 0.5 V
MDC IDC MSMT LEADCHNL RV PACING THRESHOLD AMPLITUDE: 0.5 V
MDC IDC MSMT LEADCHNL RV PACING THRESHOLD PULSEWIDTH: 0.5 ms
MDC IDC MSMT LEADCHNL RV PACING THRESHOLD PULSEWIDTH: 0.5 ms
MDC IDC PG IMPLANT DT: 20171221
MDC IDC SESS DTM: 20180328170400
MDC IDC STAT BRADY RA PERCENT PACED: 34 %
Pulse Gen Serial Number: 7387049

## 2016-12-21 DIAGNOSIS — M79602 Pain in left arm: Secondary | ICD-10-CM | POA: Diagnosis not present

## 2016-12-21 DIAGNOSIS — Z6825 Body mass index (BMI) 25.0-25.9, adult: Secondary | ICD-10-CM | POA: Diagnosis not present

## 2016-12-23 ENCOUNTER — Ambulatory Visit (INDEPENDENT_AMBULATORY_CARE_PROVIDER_SITE_OTHER): Payer: Medicare Other | Admitting: Orthopaedic Surgery

## 2016-12-23 DIAGNOSIS — S46212A Strain of muscle, fascia and tendon of other parts of biceps, left arm, initial encounter: Secondary | ICD-10-CM

## 2016-12-23 NOTE — Progress Notes (Signed)
Office Visit Note   Patient: Joseph Morrow           Date of Birth: October 30, 1945           MRN: [id number] Visit Date: 12/23/2016              Requested by: Marton Redwood, MD [address] [place], [place] [address] PCP: Marton Redwood, MD   Assessment & Plan: Visit Diagnoses:  1. Rupture of distal biceps tendon, left, initial encounter     Plan: This is definitely a distal biceps tendon rupture. He compensated well for his other rupture years ago and at age 21 the integrity of the collagen fibers may prohibit a full repair. I like see him back in just a week to see how he is doing overall. He feels that if he can get back to lifting the same amount that he does now with his job these fine not have any type of surgery since he did well with his right distal biceps tendon rupture. We'll reevaluate him in a week. We long and thorough discussion about options as well.  Follow-Up Instructions: Return in about 1 week (around 12/30/2016).   Orders:  No orders of the defined types were placed in this encounter.  No orders of the defined types were placed in this encounter.     Procedures: No procedures performed   Clinical Data: No additional findings.   Subjective: No chief complaint on file. The patient is a 71 year old gentleman that we've seen remotely in the past. He comes in with a new chief complaint referred from his primary care physician Dr. Brigitte Pulse to evaluate acute left arm injury. He is actually had a right distal biceps tendon rupture years ago of his dominant arm and he compensated for this well over time. He is someone who does have a pacemaker defibrillator and he was doing some heavy lifting 2 days ago he felt a pop in his elbow he points the antecubital fossa as a source of his pain and there is obviously bruising in this area. He denies a nubs and tingling in his hand.  HPI  Review of Systems Glendell Docker he denies any headache, chest pain, shortness of breath, fever,  chills, nausea, vomiting.  Objective: Vital Signs: There were no vitals taken for this visit.  Physical Exam He is alert and oriented 3 and in no acute distress Ortho Exam Examination of his left arm shows deficits of the distal biceps tendon. There is obvious bruising around the antecubital fossa and there is pain in this area. I could not palpate the distal biceps tendon. He has full flexion extension as well as pronation and supination of the elbow. He is neurovascular intact. He is definitely weak with elbow function. Specialty Comments:  No specialty comments available.  Imaging: No results found.   PMFS History: Patient Active Problem List   Diagnosis Date Noted  . Rupture of distal biceps tendon, left, initial encounter 12/23/2016  . Multiple lung nodules on CT 03/20/2016  . Cough 03/10/2016  . Nausea 11/22/2015  . PAF (paroxysmal atrial fibrillation) ([place]) 08/20/2015  . Hx of colonic polyps 05/14/2014  . 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/04/2013  . VT (ventricular tachycardia) ([place]) 09/04/2013  . V tach ([place]) 08/18/2013  . Hypertrophic cardiomyopathy ([place])   . Heart murmur   . Mitral regurgitation   . GERD (gastroesophageal reflux disease)   . Acute cholecystitis 06/21/2011   Past Medical History:  Diagnosis Date  . Acute cholecystitis  06/21/2011  . Allergy    SEASONAL  . Cataract   . GERD (gastroesophageal reflux disease)   . Hearing loss   . Hiatal hernia   . Hypertrophic cardiomyopathy ([place])   . Mitral regurgitation    Moderate  . Paroxysmal atrial fibrillation ([place]) 04/2015   documented on ICD remote interrogation  . Septal defect    LV Septal hyptertrophy 30mm, normal EF, minimal LVOT gradiant.  . VT (ventricular tachycardia) ([place]) 08/18/2013   s/p St. Jude ICD    Family History  Problem Relation Age of Onset  . Hypertrophic cardiomyopathy Mother   . Arrhythmia Mother   . Heart failure Mother   . Pneumonia Father   .  Diabetes Father   . Colon polyps Father   . Heart attack Brother   . Hypertrophic cardiomyopathy Brother   . Heart Problems Brother     VAD  . Arrhythmia Brother   . Diabetes Brother   . Sudden death Brother   . Lung disease Neg Hx     Past Surgical History:  Procedure Laterality Date  . APPENDECTOMY  1950  . CHOLECYSTECTOMY  06/24/2011  . COLONOSCOPY    . EP IMPLANTABLE DEVICE N/A 09/03/2016   SJM Fortify Assura Dr generator change by Dr Rayann Heman.  ICD implanted for secondary prevention of sudden death  . IMPLANTABLE CARDIOVERTER DEFIBRILLATOR IMPLANT  08/18/2013   St. Jude ICD, serial # [id number]  . IMPLANTABLE CARDIOVERTER DEFIBRILLATOR IMPLANT N/A 08/18/2013   Procedure: IMPLANTABLE CARDIOVERTER DEFIBRILLATOR IMPLANT;  Surgeon: Coralyn Mark, MD;  Location: [facility] CATH LAB;  Service: Cardiovascular;  Laterality: N/A;   Social History   Occupational History  . retired    Social History Main Topics  . Smoking status: Former Smoker    Packs/day: 2.00    Years: 20.00    Types: Cigarettes    Quit date: 07/06/1981  . Smokeless tobacco: Never Used  . Alcohol use 0.0 oz/week     Comment: 2 glasses per day  . Drug use: No  . Sexual activity: Not on file

## 2016-12-28 ENCOUNTER — Telehealth (INDEPENDENT_AMBULATORY_CARE_PROVIDER_SITE_OTHER): Payer: Self-pay | Admitting: Orthopaedic Surgery

## 2016-12-28 NOTE — Telephone Encounter (Signed)
Patient states he's having swelling & more bruising, along with severe pain and wanted to speak with Dr Ninfa Linden or his assistant

## 2016-12-28 NOTE — Telephone Encounter (Signed)
Patient states he's very sore, swelling down arm, "greyish coloring", wants to know if he should be concerned

## 2016-12-30 ENCOUNTER — Ambulatory Visit (INDEPENDENT_AMBULATORY_CARE_PROVIDER_SITE_OTHER): Payer: Medicare Other | Admitting: Physician Assistant

## 2016-12-30 DIAGNOSIS — S46212S Strain of muscle, fascia and tendon of other parts of biceps, left arm, sequela: Secondary | ICD-10-CM

## 2016-12-30 MED ORDER — TEMAZEPAM 7.5 MG PO CAPS: 7.5000 mg | ORAL_CAPSULE | Freq: Every evening | ORAL | 0 refills | Status: DC | PRN

## 2016-12-30 NOTE — Progress Notes (Signed)
  History of present illness Mr.Guillen returns today follow-up of his left distal biceps rupture. He states he's not having a lot of pain during the day mainly having pain at night. He is not getting sleep. He states overall his motion is improving.  Left upper extremity with edema and ecchymosis from the mid forearm down to the wrist. No rashes skin lesions ulcerations. Radial pulses 2+ Refills less than 2 seconds. He has good range of motion elbow and the forearm in supination pronation.  Impression: left distal biceps rupture  Plan: Ice / heat to the arm for comfort. No heavy lifting with the left arm. Suggested squeeze ball to help with overall edema of the arm. He'll follow up with [place] in 1 month to check his progress or lack of. Restoril for sleep.

## 2017-01-08 DIAGNOSIS — D692 Other nonthrombocytopenic purpura: Secondary | ICD-10-CM | POA: Diagnosis not present

## 2017-01-08 DIAGNOSIS — L853 Xerosis cutis: Secondary | ICD-10-CM | POA: Diagnosis not present

## 2017-01-08 DIAGNOSIS — L821 Other seborrheic keratosis: Secondary | ICD-10-CM | POA: Diagnosis not present

## 2017-01-08 DIAGNOSIS — L578 Other skin changes due to chronic exposure to nonionizing radiation: Secondary | ICD-10-CM | POA: Diagnosis not present

## 2017-01-08 DIAGNOSIS — D1801 Hemangioma of skin and subcutaneous tissue: Secondary | ICD-10-CM | POA: Diagnosis not present

## 2017-01-26 ENCOUNTER — Telehealth: Payer: Self-pay

## 2017-01-26 NOTE — Telephone Encounter (Signed)
Spoke with Joseph Morrow regarding episode from Saturday 5/12, Joseph Morrow stated that he was out in the yard working in the heat and felt his heart rate go up and stated that he went inside and sat down and cooled off. Informed Joseph Morrow that the transmission showed an episode of atrial fibrillation that lasted over day. Joseph Morrow agreeable to appointment with Dr. Rayann Heman 02/15/2017 at 8:30am to discuss increase in atrial fibrillation.

## 2017-01-27 ENCOUNTER — Ambulatory Visit (INDEPENDENT_AMBULATORY_CARE_PROVIDER_SITE_OTHER): Payer: Medicare Other | Admitting: Physician Assistant

## 2017-01-27 ENCOUNTER — Encounter: Payer: Self-pay | Admitting: Internal Medicine

## 2017-01-27 DIAGNOSIS — S46212A Strain of muscle, fascia and tendon of other parts of biceps, left arm, initial encounter: Secondary | ICD-10-CM

## 2017-01-27 NOTE — Progress Notes (Signed)
Joseph Morrow is now approximately 6 weeks status post left distal biceps tendon rupture. He is overall doing well. He states having no pain. He feels like his range of motion strength are improving. 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   Physical exam left elbow biceps distal tendon is palpable. Nontender. He has full flexion extension of the elbow full supination pronation of the arm. He has no pain with motion of the arm whatsoever.  Plan at this point time we'll return to activities slowly. I advised him no lifting with both arms greater than 20 pounds at least until least 3 months status post the injury. Then increase weight as tolerated. He'll follow up with [place] on an as-needed basis.

## 2017-01-28 ENCOUNTER — Telehealth: Payer: Self-pay | Admitting: Internal Medicine

## 2017-01-28 NOTE — Telephone Encounter (Signed)
Patient made aware of recommendations.  Advised to call if becomes symptomatic, monitor BP and HR, and keep follow up with Dr. Rayann Heman.   Advised I would call if Dr. Rayann Heman has any further recommendations.

## 2017-01-28 NOTE — Telephone Encounter (Signed)
Returned call to patient-reports he didn't sleep well last night, his heart was pounding all night but not at a rapid rate and this kept him awake.  Patient asymptomatic.  Reports he increased his atenolol back to his previous dose of 50mg  daily, started the increased dose yesterday  States he takes his HR often on his fitbit and it is around 60s.     Per chart review: Afib noted on 5/12 after episode of increased HR.  See telephone note.  Upcoming appt with Dr Rayann Heman on 6/4 to discuss.

## 2017-01-28 NOTE — Telephone Encounter (Signed)
Patient calling, states that he had "some strong heart pumping action" last night. Patient states that he had several episodes that kept him awake most of the night. Please call to discuss. Thanks.

## 2017-01-28 NOTE — Telephone Encounter (Addendum)
Left message to call back.  Per chart review-episode of PAF in 2016 per transmission, was not started on anticoag at the time.  See OV note with Dr. Marlou Porch 07/2015  Spoke to pharmacist-low risk, continue current regimen of ASA and keep follow up with Dr. Rayann Heman 6/4.

## 2017-01-28 NOTE — Telephone Encounter (Signed)
Spoke with patient and gave instructions on how to send remote transmission. Will review once received.

## 2017-01-29 ENCOUNTER — Other Ambulatory Visit: Payer: Self-pay | Admitting: *Deleted

## 2017-01-29 MED ORDER — APIXABAN 5 MG PO TABS: 5.0000 mg | ORAL_TABLET | Freq: Two times a day (BID) | ORAL | 6 refills | Status: DC

## 2017-01-29 NOTE — Telephone Encounter (Signed)
Spoke to patient in regards to MyChart messages (see chart)-appt made with Roderic Palau in [facility] at 8:30 on Monday 5/21 to discuss.     Spoke to [employer] NP-ok to start Eliquis 5mg  BID.  Patient made aware and verbalized understanding.    Rx sent to pharmacy.

## 2017-02-01 ENCOUNTER — Encounter (HOSPITAL_COMMUNITY): Payer: Self-pay | Admitting: Nurse Practitioner

## 2017-02-01 ENCOUNTER — Ambulatory Visit (HOSPITAL_COMMUNITY)
Admission: RE | Admit: 2017-02-01 | Discharge: 2017-02-01 | Disposition: A | Discharge status: Home / Self Care | Payer: Medicare Other | Source: Ambulatory Visit | Attending: Nurse Practitioner | Admitting: Nurse Practitioner

## 2017-02-01 VITALS — BP 114/72 | HR 71 | Ht 70.0 in | Wt 178.8 lb

## 2017-02-01 DIAGNOSIS — I4891 Unspecified atrial fibrillation: Secondary | ICD-10-CM

## 2017-02-01 DIAGNOSIS — Z79899 Other long term (current) drug therapy: Secondary | ICD-10-CM | POA: Diagnosis not present

## 2017-02-01 DIAGNOSIS — I480 Paroxysmal atrial fibrillation: Secondary | ICD-10-CM | POA: Diagnosis not present

## 2017-02-01 DIAGNOSIS — Z95810 Presence of automatic (implantable) cardiac defibrillator: Secondary | ICD-10-CM | POA: Diagnosis not present

## 2017-02-01 DIAGNOSIS — I422 Other hypertrophic cardiomyopathy: Secondary | ICD-10-CM | POA: Insufficient documentation

## 2017-02-01 DIAGNOSIS — K219 Gastro-esophageal reflux disease without esophagitis: Secondary | ICD-10-CM | POA: Insufficient documentation

## 2017-02-01 DIAGNOSIS — Z87891 Personal history of nicotine dependence: Secondary | ICD-10-CM | POA: Insufficient documentation

## 2017-02-01 DIAGNOSIS — Z7982 Long term (current) use of aspirin: Secondary | ICD-10-CM | POA: Diagnosis not present

## 2017-02-01 DIAGNOSIS — Z7901 Long term (current) use of anticoagulants: Secondary | ICD-10-CM | POA: Insufficient documentation

## 2017-02-02 ENCOUNTER — Telehealth: Payer: Self-pay | Admitting: Pharmacist

## 2017-02-02 NOTE — Progress Notes (Addendum)
 Primary Care Physician: Shaw, William, MD Referring Physician:[address] Triage EP: Dr. Allred   Joseph Morrow is a 71 y.o. male with a h/o hypertrophic cardiomyopathy, s/p ICD for VT, remote paroxysmal afib and most recent increase of afib burden to 18%, March 2018, but in persistent afib since a week ago  Saturday.He has noted increase in HR with activity and some fatigue but other wise, heart rates are mostly controlled on current dose of atenolol. He is very active and does not like to see his heart rate go up with activity. He would very much like to restore SR. He just started on eliquis 5 mg bid, first full day Saturday May 19, for chadsvasc score of 2(age, LV dysfunction). Last echo was 2014.Had generator change out in January.  Today, he denies symptoms of   chest pain, shortness of breath, orthopnea, PND, lower extremity edema, dizziness, presyncope, syncope, or neurologic sequela.  Positive for palpitations and fatigue.The patient is tolerating medications without difficulties and is otherwise without complaint today.   Past Medical History:  Diagnosis Date  . Acute cholecystitis 06/21/2011  . Allergy    SEASONAL  . Cataract   . GERD (gastroesophageal reflux disease)   . Hearing loss   . Hiatal hernia   . Hypertrophic cardiomyopathy (HCC)   . Mitral regurgitation    Moderate  . Paroxysmal atrial fibrillation (HCC) 04/2015   documented on ICD remote interrogation  . Septal defect    LV Septal hyptertrophy 18mm, normal EF, minimal LVOT gradiant.  . VT (ventricular tachycardia) (HCC) 08/18/2013   s/p St. Jude ICD   Past Surgical History:  Procedure Laterality Date  . APPENDECTOMY  1950  . CHOLECYSTECTOMY  06/24/2011  . COLONOSCOPY    . EP IMPLANTABLE DEVICE N/A 09/03/2016   SJM Fortify Assura Dr generator change by Dr Allred.  ICD implanted for secondary prevention of sudden death  . IMPLANTABLE CARDIOVERTER DEFIBRILLATOR IMPLANT  08/18/2013   St. Jude ICD,  serial # [id number]  . IMPLANTABLE CARDIOVERTER DEFIBRILLATOR IMPLANT N/A 08/18/2013   Procedure: IMPLANTABLE CARDIOVERTER DEFIBRILLATOR IMPLANT;  Surgeon: James D Allred, MD;  Location: [facility] CATH LAB;  Service: Cardiovascular;  Laterality: N/A;    Current Outpatient Prescriptions  Medication Sig Dispense Refill  . apixaban (ELIQUIS) 5 MG TABS tablet Take 1 tablet (5 mg total) by mouth 2 (two) times daily. 60 tablet 6  . aspirin 81 MG tablet Take 81 mg by mouth daily.      . atenolol (TENORMIN) 25 MG tablet Take 1 tablet (25 mg total) by mouth daily. (Patient taking differently: Take 25 mg by mouth 2 (two) times daily. ) 90 tablet 3  . Coenzyme Q10 (CO Q 10) 100 MG CAPS Take 100 mg by mouth daily.     . folic acid (FOLVITE) 1 MG tablet Take 1 mg by mouth daily.     . glucosamine-chondroitin 500-400 MG tablet Take 1 tablet by mouth daily.    . pantoprazole (PROTONIX) 40 MG tablet Take 40 mg by mouth daily.    . Zinc 50 MG CAPS Take 50 mg by mouth daily.     . acidophilus (RISAQUAD) CAPS capsule Take 1 capsule by mouth daily.     No current facility-administered medications for this encounter.     Allergies  Allergen Reactions  . Antihistamines, Diphenhydramine-Type Other (See Comments)    "nervous"  . Dextromethorphan     nervous  . Versed [Midazolam] Other (See Comments)    "hallucinations, headache"      Social History   Social History  . Marital status: Married    Spouse name: N/A  . Number of children: 2  . Years of education: N/A   Occupational History  . retired    Social History Main Topics  . Smoking status: Former Smoker    Packs/day: 2.00    Years: 20.00    Types: Cigarettes    Quit date: 07/06/1981  . Smokeless tobacco: Never Used  . Alcohol use 0.0 oz/week     Comment: 2 glasses per day  . Drug use: No  . Sexual activity: Not on file   Other Topics Concern  . Not on file   Social History Narrative   Originally from [place]. Moved to [place] in 2012. Currently has no  pets. No bird or hot tub exposure. 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 He did have exposure to Beneze, Acetone, Styrene, & Hydrocarbon early in his career.    Family History  Problem Relation Age of Onset  . Hypertrophic cardiomyopathy Mother   . Arrhythmia Mother   . Heart failure Mother   . Pneumonia Father   . Diabetes Father   . Colon polyps Father   . Heart attack Brother   . Hypertrophic cardiomyopathy Brother   . Heart Problems Brother        VAD  . Arrhythmia Brother   . Diabetes Brother   . Sudden death Brother   . Lung disease Neg Hx     ROS- All systems are reviewed and negative except as per the HPI above  Physical Exam: Vitals:   02/01/17 0829  BP: 114/72  Pulse: 71  Weight: 178 lb 12.8 oz (81.1 kg)  Height: 5' 10" (1.778 m)   Wt Readings from Last 3 Encounters:  02/01/17 178 lb 12.8 oz (81.1 kg)  12/09/16 175 lb (79.4 kg)  09/03/16 170 lb (77.1 kg)    Labs: Lab Results  Component Value Date   NA 141 09/03/2016   K 4.5 09/03/2016   CL 103 09/03/2016   CO2 29 09/03/2016   GLUCOSE 95 09/03/2016   BUN 13 09/03/2016   CREATININE 1.27 (H) 09/03/2016   CALCIUM 9.3 09/03/2016   MG 1.8 08/19/2013   No results found for: INR No results found for: CHOL, HDL, LDLCALC, TRIG   GEN- The patient is well appearing, alert and oriented x 3 today.   Head- normocephalic, atraumatic Eyes-  Sclera clear, conjunctiva pink Ears- hearing intact Oropharynx- clear Neck- supple, no JVP Lymph- no cervical lymphadenopathy Lungs- Clear to ausculation bilaterally, normal work of breathing Heart-irregular rate and rhythm, no murmurs, rubs or gallops, PMI not laterally displaced GI- soft, NT, ND, + BS Extremities- no clubbing, cyanosis, or edema MS- no significant deformity or atrophy Skin- no rash or lesion Psych- euthymic mood, full affect Neuro- strength and sensation are intact  EKG-atrial sensed v paced rhythm but  appears to show fib/flutter waves. Echo- 2014 Study Conclusions Paceart reviewed  - Left ventricle: The cavity size was normal. There wasmoderate focal basal hypertrophy of the septum. Systolic function was vigorous. The estimated ejection fraction was in the range of 65% to 70%. Wall motion was normal; there were no regional wall motion abnormalities. Features are consistent with a pseudonormal left ventricular filling pattern, with concomitant abnormal relaxation and increased filling pressure (grade 2 diastolic dysfunction). - Mitral valve: Mild regurgitation. - Left atrium: The atrium was mildly dilated. - Right ventricle: The cavity size was mildly  dilated. Wall thickness was normal. - Right atrium: The atrium was mildly dilated. - Pulmonary arteries: PA peak pressure: 33mm Hg (S). Impressions:  - No significant change from prior echocardiogram. Basal septal hypertrophic cardiomyopathy. No evidence of systolic anterior motion mitral valve. No significant left ventricular outflow tract gradient.    Assessment and Plan: 1. Paroxysmal afib Increase in burden since March of this year, but persistent since one week ago Saturday per pt as by wearable fitbit Continue eliquis 5 mg bid, first full day Saturday, 5/19, can stop asa,  Continue atenolol, rates at rest controlled Update echo  I discussed with Dr, Allred and after pt has been on DOAC x 3 weeks, he thinks hospitalization for sotalol would be a good option.Pt has ICD, if initial loading goes well, he may only have to stay in the hospital x 48 hours.  Will arrange after review of echo and pt has met 3 weeks of anticoagulation    C. , ANP-C [facility] [address] [place], [place] [address] [phone]    

## 2017-02-02 NOTE — Addendum Note (Signed)
Encounter addended by: Sherran Needs, NP on: 02/02/2017  3:35 PM<BR>    Actions taken: Sign clinical note

## 2017-02-02 NOTE — Telephone Encounter (Signed)
Medication list reviewed pending sotalol initiation. No notable drug interactions or QT prolonging medications, QTc 447 from EKG on 02/01/17. Pt will likely need to discontinue atenolol once sotalol is initiated. Pt has not been on amiodarone in the past 6 months. Pt CrCl is 61 mL/min, very close to cut off for daily dosing versus twice daily dosing - will need to monitor clearance closely once patient is admitted to ensure correct sotalol dosing. Pt started on apixaban 5mg  BID (dose appropriate) on 5/20 per [facility] Less OV note. Sotalol initiation date no earlier than June 11th, 2018.

## 2017-02-12 ENCOUNTER — Ambulatory Visit (HOSPITAL_COMMUNITY)
Admission: RE | Admit: 2017-02-12 | Discharge: 2017-02-12 | Disposition: A | Discharge status: Home / Self Care | Payer: Medicare Other | Source: Ambulatory Visit | Attending: Nurse Practitioner | Admitting: Nurse Practitioner

## 2017-02-12 DIAGNOSIS — I480 Paroxysmal atrial fibrillation: Secondary | ICD-10-CM | POA: Diagnosis not present

## 2017-02-12 DIAGNOSIS — I4891 Unspecified atrial fibrillation: Secondary | ICD-10-CM | POA: Diagnosis present

## 2017-02-12 DIAGNOSIS — I517 Cardiomegaly: Secondary | ICD-10-CM | POA: Insufficient documentation

## 2017-02-12 DIAGNOSIS — I361 Nonrheumatic tricuspid (valve) insufficiency: Secondary | ICD-10-CM | POA: Diagnosis not present

## 2017-02-12 DIAGNOSIS — I340 Nonrheumatic mitral (valve) insufficiency: Secondary | ICD-10-CM | POA: Diagnosis not present

## 2017-02-12 NOTE — Progress Notes (Signed)
  Echocardiogram 2D Echocardiogram has been performed.  Joseph Morrow 02/12/2017, 9:14 AM

## 2017-02-15 ENCOUNTER — Encounter: Payer: Self-pay | Admitting: Internal Medicine

## 2017-02-15 ENCOUNTER — Ambulatory Visit (INDEPENDENT_AMBULATORY_CARE_PROVIDER_SITE_OTHER): Payer: Medicare Other | Admitting: Internal Medicine

## 2017-02-15 VITALS — BP 114/82 | HR 73 | Ht 70.0 in | Wt 179.0 lb

## 2017-02-15 DIAGNOSIS — I481 Persistent atrial fibrillation: Secondary | ICD-10-CM | POA: Diagnosis not present

## 2017-02-15 DIAGNOSIS — I480 Paroxysmal atrial fibrillation: Secondary | ICD-10-CM | POA: Diagnosis not present

## 2017-02-15 DIAGNOSIS — I472 Ventricular tachycardia, unspecified: Secondary | ICD-10-CM

## 2017-02-15 DIAGNOSIS — I422 Other hypertrophic cardiomyopathy: Secondary | ICD-10-CM | POA: Diagnosis not present

## 2017-02-15 DIAGNOSIS — I4819 Other persistent atrial fibrillation: Secondary | ICD-10-CM

## 2017-02-15 LAB — BASIC METABOLIC PANEL
BUN/Creatinine Ratio: 14 (ref 10–24)
BUN: 17 mg/dL (ref 8–27)
CHLORIDE: 103 mmol/L (ref 96–106)
CO2: 23 mmol/L (ref 18–29)
Calcium: 9.4 mg/dL (ref 8.6–10.2)
Creatinine, Ser: 1.21 mg/dL (ref 0.76–1.27)
GFR, EST AFRICAN AMERICAN: 69 mL/min/{1.73_m2} (ref >59)
GFR, EST NON AFRICAN AMERICAN: 60 mL/min/{1.73_m2} (ref >59)
Glucose: 90 mg/dL (ref 65–99)
Potassium: 5.1 mmol/L (ref 3.5–5.2)
SODIUM: 140 mmol/L (ref 134–144)

## 2017-02-15 LAB — MAGNESIUM: Magnesium: 2 mg/dL (ref 1.6–2.3)

## 2017-02-15 MED ORDER — ATENOLOL 50 MG PO TABS: 50.0000 mg | ORAL_TABLET | Freq: Every day | ORAL | 3 refills | Status: DC

## 2017-02-15 NOTE — Patient Instructions (Signed)
Medication Instructions:  Your physician recommends that you continue on your current medications as directed. Please refer to the Current Medication list given to you today.   Labwork: Your physician recommends that you return for lab work today: BMP/Mag   Testing/Procedures:  Sotalol load on 02/23/17 ---hospital will call you when a bed is ready  Follow-Up: Your physician recommends that you schedule a follow-up appointment ---will schedule from hospital   Any Other Special Instructions Will Be Listed Below (If Applicable).     If you need a refill on your cardiac medications before your next appointment, please call your pharmacy.

## 2017-02-15 NOTE — Progress Notes (Signed)
PCP: Marton Redwood, MD Primary Cardiologist:  Dr Job Founds is a 71 y.o. male who presents today for routine electrophysiology followup.  Since last being seen in our clinic, the patient reports doing reasonably well.  He has developed exertional SOB and fatigue with persistent afib.  He has been initiated on eliquis and is tolerating this without difficulty.  Today, he denies symptoms of palpitations, chest pain,  lower extremity edema, dizziness, presyncope, syncope, or ICD shocks.  The patient is otherwise without complaint today.   Past Medical History:  Diagnosis Date  . Acute cholecystitis 06/21/2011  . Allergy    SEASONAL  . Cataract   . GERD (gastroesophageal reflux disease)   . Hearing loss   . Hiatal hernia   . Hypertrophic cardiomyopathy ([place])   . Mitral regurgitation    Moderate  . Paroxysmal atrial fibrillation ([place]) 04/2015   documented on ICD remote interrogation  . Septal defect    LV Septal hyptertrophy 32mm, normal EF, minimal LVOT gradiant.  . VT (ventricular tachycardia) ([place]) 08/18/2013   s/p St. Jude ICD   Past Surgical History:  Procedure Laterality Date  . APPENDECTOMY  1950  . CHOLECYSTECTOMY  06/24/2011  . COLONOSCOPY    . EP IMPLANTABLE DEVICE N/A 09/03/2016   SJM Fortify Assura Dr generator change by Dr Rayann Heman.  ICD implanted for secondary prevention of sudden death  . IMPLANTABLE CARDIOVERTER DEFIBRILLATOR IMPLANT  08/18/2013   St. Jude ICD, serial # [id number]  . IMPLANTABLE CARDIOVERTER DEFIBRILLATOR IMPLANT N/A 08/18/2013   Procedure: IMPLANTABLE CARDIOVERTER DEFIBRILLATOR IMPLANT;  Surgeon: Coralyn Mark, MD;  Location: [facility] CATH LAB;  Service: Cardiovascular;  Laterality: N/A;    ROS- all systems are reviewed and negative except as per HPI above  Current Outpatient Prescriptions  Medication Sig Dispense Refill  . acidophilus (RISAQUAD) CAPS capsule Take 1 capsule by mouth daily.    Marland Kitchen apixaban (ELIQUIS) 5 MG TABS tablet Take 1 tablet  (5 mg total) by mouth 2 (two) times daily. 60 tablet 6  . atenolol (TENORMIN) 50 MG tablet Take 1 tablet (50 mg total) by mouth daily. 90 tablet 3  . Coenzyme Q10 (CO Q 10) 100 MG CAPS Take 100 mg by mouth daily.     . folic acid (FOLVITE) 1 MG tablet Take 1 mg by mouth daily.     Marland Kitchen glucosamine-chondroitin 500-400 MG tablet Take 1 tablet by mouth daily.    . pantoprazole (PROTONIX) 40 MG tablet Take 40 mg by mouth daily.    . Zinc 50 MG CAPS Take 50 mg by mouth daily.      No current facility-administered medications for this visit.     Physical Exam: Vitals:   02/15/17 0844  BP: 114/82  Pulse: 73  SpO2: 96%  Weight: 179 lb (81.2 kg)  Height: 5\' 10"  (1.778 m)    GEN- The patient is well appearing, alert and oriented x 3 today.   Head- normocephalic, atraumatic Eyes-  Sclera clear, conjunctiva pink Ears- hearing intact Oropharynx- clear Lungs- Clear to ausculation bilaterally, normal work of breathing Chest- ICD pocket is well healed Heart- irregular rate and rhythm, no murmurs, rubs or gallops, PMI not laterally displaced GI- soft, NT, ND, + BS Extremities- no clubbing, cyanosis, or edema  ICD interrogation- personally  reviewed in detail today,  See PACEART report  AF clinic notes and recent ekgs are reviewed Recent echo images are personally reviewed and discussed with Dr Marlou Porch today.  Assessment and Plan:  1.  Persistent afib He has been in afib since March and is quite symptomatic Therapeutic strategies for afib including rate and rhythm control were discussed in detail with the patient today. He would like to pursue sinus rhythm.  Echo reveals severe LA enlargement.  Medicine options of norpace, amiodarone, and sotalol were considered.  Norpace has not been recently commercially available. Risks of amiodarone are increased.  We agree with sotalol as our option.  He will be admitted for initiation of sotalol within the next 1-2 weeks.  Importance of compliance with  anticoagulation was discussed today.  2. Moderate MR I discussed TEE as an option with Dr Marlou Porch.  Upon review of echo images today, we decided to reassess clinically after he has achieved/ maintained sinus rhythm  3. HCM Stable on an appropriate medical regimen Normal ICD function See Pace Art report No changes today  4. VT No recent episodes  Will need close follow-up after initiation of sotalol  Very complicated situation.  Pt is at risk for decompensation/ arrhythmias.  Echo images reviewed with Dr Marlou Porch.  A high level of decision making was required for this encounter.  [name], [facility] 02/15/2017 5:14 PM

## 2017-02-19 ENCOUNTER — Encounter: Payer: Self-pay | Admitting: Internal Medicine

## 2017-02-23 ENCOUNTER — Encounter (HOSPITAL_COMMUNITY): Payer: Self-pay | Admitting: General Practice

## 2017-02-23 ENCOUNTER — Inpatient Hospital Stay (HOSPITAL_COMMUNITY)
Admission: RE | Admit: 2017-02-23 | Discharge: 2017-02-25 | DRG: 310 | Disposition: A | Discharge status: Home / Self Care | Payer: Medicare Other | Source: Ambulatory Visit | Attending: Internal Medicine | Admitting: Internal Medicine

## 2017-02-23 DIAGNOSIS — I481 Persistent atrial fibrillation: Secondary | ICD-10-CM | POA: Diagnosis not present

## 2017-02-23 DIAGNOSIS — Z7901 Long term (current) use of anticoagulants: Secondary | ICD-10-CM | POA: Diagnosis not present

## 2017-02-23 DIAGNOSIS — Z8249 Family history of ischemic heart disease and other diseases of the circulatory system: Secondary | ICD-10-CM

## 2017-02-23 DIAGNOSIS — Z833 Family history of diabetes mellitus: Secondary | ICD-10-CM | POA: Diagnosis not present

## 2017-02-23 DIAGNOSIS — I483 Typical atrial flutter: Secondary | ICD-10-CM | POA: Diagnosis present

## 2017-02-23 DIAGNOSIS — K810 Acute cholecystitis: Secondary | ICD-10-CM | POA: Diagnosis not present

## 2017-02-23 DIAGNOSIS — Z95810 Presence of automatic (implantable) cardiac defibrillator: Secondary | ICD-10-CM | POA: Diagnosis not present

## 2017-02-23 DIAGNOSIS — Z87891 Personal history of nicotine dependence: Secondary | ICD-10-CM | POA: Diagnosis not present

## 2017-02-23 DIAGNOSIS — I4891 Unspecified atrial fibrillation: Secondary | ICD-10-CM | POA: Diagnosis present

## 2017-02-23 DIAGNOSIS — I421 Obstructive hypertrophic cardiomyopathy: Secondary | ICD-10-CM | POA: Diagnosis not present

## 2017-02-23 DIAGNOSIS — I472 Ventricular tachycardia: Secondary | ICD-10-CM | POA: Diagnosis not present

## 2017-02-23 DIAGNOSIS — K219 Gastro-esophageal reflux disease without esophagitis: Secondary | ICD-10-CM | POA: Diagnosis present

## 2017-02-23 DIAGNOSIS — I340 Nonrheumatic mitral (valve) insufficiency: Secondary | ICD-10-CM | POA: Diagnosis not present

## 2017-02-23 DIAGNOSIS — Z8371 Family history of colonic polyps: Secondary | ICD-10-CM | POA: Diagnosis not present

## 2017-02-23 DIAGNOSIS — I480 Paroxysmal atrial fibrillation: Secondary | ICD-10-CM | POA: Diagnosis not present

## 2017-02-23 HISTORY — DX: Other complications of anesthesia, initial encounter: T88.59XA

## 2017-02-23 HISTORY — DX: Adverse effect of unspecified anesthetic, initial encounter: T41.45XA

## 2017-02-23 LAB — BASIC METABOLIC PANEL
ANION GAP: 4 — AB (ref 5–15)
BUN: 18 mg/dL (ref 6–20)
CHLORIDE: 107 mmol/L (ref 101–111)
CO2: 28 mmol/L (ref 22–32)
Calcium: 8.6 mg/dL — ABNORMAL LOW (ref 8.9–10.3)
Creatinine, Ser: 1.18 mg/dL (ref 0.61–1.24)
GFR calc Af Amer: >60 mL/min (ref >60)
Glucose, Bld: 89 mg/dL (ref 65–99)
POTASSIUM: 4.4 mmol/L (ref 3.5–5.1)
SODIUM: 139 mmol/L (ref 135–145)

## 2017-02-23 LAB — MAGNESIUM: MAGNESIUM: 2.1 mg/dL (ref 1.7–2.4)

## 2017-02-23 MED ORDER — ZINC 50 MG PO CAPS: 50.0000 mg | ORAL_CAPSULE | Freq: Every day | ORAL | Status: DC

## 2017-02-23 MED ORDER — SOTALOL HCL 80 MG PO TABS
120.0000 mg | ORAL_TABLET | Freq: Two times a day (BID) | ORAL | Status: DC
  Administered 2017-02-23 – 2017-02-25 (×4): 120 mg via ORAL
  Filled 2017-02-23 (×4): qty 2

## 2017-02-23 MED ORDER — FOLIC ACID 1 MG PO TABS
1.0000 mg | ORAL_TABLET | Freq: Every day | ORAL | Status: DC
  Administered 2017-02-24 – 2017-02-25 (×2): 1 mg via ORAL
  Filled 2017-02-23 (×2): qty 1

## 2017-02-23 MED ORDER — ACETAMINOPHEN 325 MG PO TABS: 650.0000 mg | ORAL_TABLET | ORAL | Status: DC | PRN

## 2017-02-23 MED ORDER — ATENOLOL 25 MG PO TABS: 50.0000 mg | ORAL_TABLET | Freq: Every day | ORAL | Status: DC

## 2017-02-23 MED ORDER — APIXABAN 5 MG PO TABS
5.0000 mg | ORAL_TABLET | Freq: Two times a day (BID) | ORAL | Status: DC
  Administered 2017-02-23 – 2017-02-25 (×4): 5 mg via ORAL
  Filled 2017-02-23 (×4): qty 1

## 2017-02-23 MED ORDER — GLUCOSAMINE-CHONDROITIN 500-400 MG PO TABS: 1.0000 | ORAL_TABLET | Freq: Every day | ORAL | Status: DC

## 2017-02-23 MED ORDER — PANTOPRAZOLE SODIUM 40 MG PO TBEC
40.0000 mg | DELAYED_RELEASE_TABLET | Freq: Every day | ORAL | Status: DC
  Administered 2017-02-24 – 2017-02-25 (×2): 40 mg via ORAL
  Filled 2017-02-23 (×2): qty 1

## 2017-02-23 MED ORDER — SODIUM CHLORIDE 0.9% FLUSH
10.0000 mL | INTRAVENOUS | Status: DC | PRN
  Administered 2017-02-24: 10 mL
  Filled 2017-02-23: qty 40

## 2017-02-23 MED ORDER — RISAQUAD PO CAPS
1.0000 | ORAL_CAPSULE | Freq: Every day | ORAL | Status: DC
  Administered 2017-02-24 – 2017-02-25 (×2): 1 via ORAL
  Filled 2017-02-23 (×2): qty 1

## 2017-02-23 MED ORDER — CO Q 10 100 MG PO CAPS: 100.0000 mg | ORAL_CAPSULE | Freq: Every day | ORAL | Status: DC

## 2017-02-23 NOTE — Progress Notes (Signed)
PHARMACIST - PHYSICIAN ORDER COMMUNICATION  CONCERNING: P&T Medication Policy on Herbal Medications  DESCRIPTION:  This patient's order for:  Zinc, glucosamine/chondroiton, coenzyme Q10  has been noted.  This product(s) is classified as an "herbal" or natural product. Due to a lack of definitive safety studies or FDA approval, nonstandard manufacturing practices, plus the potential risk of unknown drug-drug interactions while on inpatient medications, the Pharmacy and Therapeutics Committee does not permit the use of "herbal" or natural products of this type within [facility].   ACTION TAKEN: The pharmacy department is unable to verify this order at this time and your patient has been informed of this safety policy. Please reevaluate patient's clinical condition at discharge and address if the herbal or natural product(s) should be resumed at that time.  Arrie Senate, PharmD PGY-1 Pharmacy Resident Pager: [phone]

## 2017-02-23 NOTE — Progress Notes (Signed)
Discussed with Dr. Rayann Heman, 02/15/17 labs are acceptable and recent enough to start Sotalol tonight SPoke with RN, patient to get Sotalol dose tonight K+ 5.1 Mag 2.0 Creat 1.21

## 2017-02-23 NOTE — H&P (Signed)
H&P    Patient ID: Nigil Braman MRN: [id number], DOB/AGE: 72-Jul-1947 70 y.o.  Admit date: 02/23/2017 Date of Admission: 02/23/2017  Primary Physician: Marton Redwood, MD Primary Cardiologist: Dr. Marlou Porch Electrophysiologist: Dr. Rayann Heman  Reason for Admission: Sotalol initiation  HPI: Flora Ratz is a 71 y.o. 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  He was seen in the office last week by Dr. Rayann Heman and planned for efforts for rhythm control and planned for sotalol admission.  The patient was started on Eliquis 01/30/17 and has not missed any doses.  He feels well, has SOB at baseline that is suspect to be secondary to his AF.     Device information: SJM dual chamber ICD, implanted 2014  Past Medical History:  Diagnosis Date  . Acute cholecystitis 06/21/2011  . Allergy    SEASONAL  . Cataract   . GERD (gastroesophageal reflux disease)   . Hearing loss   . Hiatal hernia   . Hypertrophic cardiomyopathy ([place])   . Mitral regurgitation    Moderate  . Paroxysmal atrial fibrillation ([place]) 04/2015   documented on ICD remote interrogation  . Septal defect    LV Septal hyptertrophy 47mm, normal EF, minimal LVOT gradiant.  . VT (ventricular tachycardia) ([place]) 08/18/2013   s/p St. Jude ICD     Surgical History:  Past Surgical History:  Procedure Laterality Date  . APPENDECTOMY  1950  . CHOLECYSTECTOMY  06/24/2011  . COLONOSCOPY    . EP IMPLANTABLE DEVICE N/A 09/03/2016   SJM Fortify Assura Dr generator change by Dr Rayann Heman.  ICD implanted for secondary prevention of sudden death  . IMPLANTABLE CARDIOVERTER DEFIBRILLATOR IMPLANT  08/18/2013   St. Jude ICD, serial # [id number]  . IMPLANTABLE CARDIOVERTER DEFIBRILLATOR IMPLANT N/A 08/18/2013   Procedure: IMPLANTABLE CARDIOVERTER DEFIBRILLATOR IMPLANT;  Surgeon: Coralyn Mark, MD;  Location: [facility] CATH LAB;  Service: Cardiovascular;  Laterality: N/A;       Prescriptions Prior to Admission  Medication Sig Dispense Refill Last Dose  . acidophilus (RISAQUAD) CAPS capsule Take 1 capsule by mouth daily.   Taking  . apixaban (ELIQUIS) 5 MG TABS tablet Take 1 tablet (5 mg total) by mouth 2 (two) times daily. 60 tablet 6 Taking  . atenolol (TENORMIN) 50 MG tablet Take 1 tablet (50 mg total) by mouth daily. 90 tablet 3   . Coenzyme Q10 (CO Q 10) 100 MG CAPS Take 100 mg by mouth daily.    Taking  . folic acid (FOLVITE) 1 MG tablet Take 1 mg by mouth daily.    Taking  . glucosamine-chondroitin 500-400 MG tablet Take 1 tablet by mouth daily.   Taking  . pantoprazole (PROTONIX) 40 MG tablet Take 40 mg by mouth daily.   Taking  . Zinc 50 MG CAPS Take 50 mg by mouth daily.    Taking    Inpatient Medications:   Allergies:  Allergies  Allergen Reactions  . Dextromethorphan     nervous  . Versed [Midazolam] Other (See Comments)    "hallucinations, headache"  . Antihistamines, Diphenhydramine-Type Anxiety    Social History   Social History  . Marital status: Married    Spouse name: N/A  . Number of children: 2  . Years of education: N/A   Occupational History  . retired    Social History Main Topics  . Smoking status: Former Smoker    Packs/day: 2.00    Years:  20.00    Types: Cigarettes    Quit date: 07/06/1981  . Smokeless tobacco: Never Used  . Alcohol use 0.0 oz/week     Comment: 2 glasses per day  . Drug use: No  . Sexual activity: Not on file   Other Topics Concern  . Not on file   Social History Narrative   Originally from [place]. Moved to [place] in 2012. Currently has no pets. No bird or hot tub exposure. 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 He did have exposure to Beneze, Acetone, Styrene, & Hydrocarbon early in his career.     Family History  Problem Relation Age of Onset  . Hypertrophic cardiomyopathy Mother   . Arrhythmia Mother   . Heart failure Mother   . Pneumonia Father    . Diabetes Father   . Colon polyps Father   . Heart attack Brother   . Hypertrophic cardiomyopathy Brother   . Heart Problems Brother        VAD  . Arrhythmia Brother   . Diabetes Brother   . Sudden death Brother   . Lung disease Neg Hx      Review of Systems: All other systems reviewed and are otherwise negative except as noted above.  Physical Exam: Vitals:   02/23/17 1414  BP: 103/73  Pulse: 70  Resp: 18  Temp: 97.7 F (36.5 C)  TempSrc: Oral  SpO2: 100%  Weight: 177 lb 14.4 oz (80.7 kg)    GEN- The patient is well appearing, alert and oriented x 3 today.   HEENT: normocephalic, atraumatic; sclera clear, conjunctiva pink; hearing intact; oropharynx clear; neck supple, no JVP Lymph- no cervical lymphadenopathy Lungs- CTA b/l, normal work of breathing.  No wheezes, rales, rhonchi Heart- IRRR, no murmurs, rubs or gallops, PMI not laterally displaced GI- soft, non-tender, non-distended Extremities- no clubbing, cyanosis, or edema MS- no significant deformity or atrophy Skin- warm and dry, no rash or lesion Psych- euthymic mood, full affect Neuro- no gross deficits observed  Labs:   Lab Results  Component Value Date   WBC 6.1 09/03/2016   HGB 15.3 09/03/2016   HCT 44.2 09/03/2016   MCV 88.8 09/03/2016   PLT 179 09/03/2016   No results for input(s): NA, K, CL, CO2, BUN, CREATININE, CALCIUM, PROT, BILITOT, ALKPHOS, ALT, AST, GLUCOSE in the last 168 hours.  Invalid input(s): LABALBU    Radiology/Studies: No results found.  EKG: AFlutter, 70bpm, QT measured by myself is 440/corrected 475 with paced/BBB QRS TELEMETRY: AFlutter 70's    Assessment and Plan:   1. AFlutter, Sotalol loading     CHA2DS2Vasc is one, on Eliquis     Labs drawn, pending     EKG as able, is OK give his BBB/paced QRS and ICD in place     plann DCCV Thursday if not in SR    Signed, Tommye Standard, PA-C 02/23/2017 3:40 PM  I have seen, examined the patient, and reviewed the above  assessment and plan. On exam, RRR (paced)  Changes to above are made where necessary.  Will admit for initiation of sotalol.  Follow closely while here.  May require cardioversion on Thursday if still in afib.  Co Sign: [name], MD 02/23/2017 5:01 PM

## 2017-02-24 DIAGNOSIS — I472 Ventricular tachycardia: Secondary | ICD-10-CM

## 2017-02-24 DIAGNOSIS — I421 Obstructive hypertrophic cardiomyopathy: Secondary | ICD-10-CM

## 2017-02-24 LAB — BASIC METABOLIC PANEL
ANION GAP: 7 (ref 5–15)
BUN: 17 mg/dL (ref 6–20)
CALCIUM: 8 mg/dL — AB (ref 8.9–10.3)
CO2: 24 mmol/L (ref 22–32)
Chloride: 107 mmol/L (ref 101–111)
Creatinine, Ser: 1.2 mg/dL (ref 0.61–1.24)
GFR, EST NON AFRICAN AMERICAN: 59 mL/min — AB (ref >60)
Glucose, Bld: 98 mg/dL (ref 65–99)
Potassium: 3.9 mmol/L (ref 3.5–5.1)
Sodium: 138 mmol/L (ref 135–145)

## 2017-02-24 LAB — MAGNESIUM: MAGNESIUM: 1.8 mg/dL (ref 1.7–2.4)

## 2017-02-24 NOTE — Progress Notes (Signed)
   SUBJECTIVE: The patient is doing well today.  At this time, he denies chest pain, shortness of breath, or any new concerns.  Marland Kitchen acidophilus  1 capsule Oral Daily  . apixaban  5 mg Oral BID  . folic acid  1 mg Oral Daily  . pantoprazole  40 mg Oral Daily  . sotalol  120 mg Oral Q12H     OBJECTIVE: Physical Exam: Vitals:   02/23/17 1414 02/23/17 1700 02/23/17 1956 02/24/17 0346  BP: 103/73  116/80 104/67  Pulse: 70  70 70  Resp: 18  18 17   Temp: 97.7 F (36.5 C)  97.4 F (36.3 C) 97.8 F (36.6 C)  TempSrc: Oral  Oral Oral  SpO2: 100%  100% 97%  Weight: 177 lb 14.4 oz (80.7 kg)     Height:  5\' 10"  (1.778 m)      Intake/Output Summary (Last 24 hours) at 02/24/17 0842 Last data filed at 02/24/17 0336  Gross per 24 hour  Intake               10 ml  Output                0 ml  Net               10 ml    Telemetry reveals afib, V  paced  GEN- The patient is well appearing, alert and oriented x 3 today.   Head- normocephalic, atraumatic Eyes-  Sclera clear, conjunctiva pink Ears- hearing intact Oropharynx- clear Neck- supple,   Lungs- Clear to ausculation bilaterally, normal work of breathing Heart- Regular rate and rhythm (paced) GI- soft, NT, ND, + BS Extremities- no clubbing, cyanosis, or edema Skin- no rash or lesion Psych- euthymic mood, full affect Neuro- strength and sensation are intact  LABS: Basic Metabolic Panel:  Recent Labs  02/23/17 1620 02/24/17 0331  NA 139 138  K 4.4 3.9  CL 107 107  CO2 28 24  GLUCOSE 89 98  BUN 18 17  CREATININE 1.18 1.20  CALCIUM 8.6* 8.0*  MG 2.1 1.8   ASSESSMENT AND PLAN:  1. Persistent afib Tolerating sotalol qtc is stable (518 msec) Stop atenolol Continue eliquis Anticipate cardioversion tomorrow am if still in afib  2. Moderate MR Stable No change required today  3. HCM Stable No change required today  4. VT No recent episodes  Possibly home Friday am if stable and in sinus rhythm   [name], MD 02/24/2017 8:42 AM

## 2017-02-24 NOTE — Progress Notes (Signed)
EKG about 3 hours post first Sotalol dose showed a QT of 500.  On call cardiologist was informed and said pt will be evaluated before next Sotalol in AM.  Will continue to monitor pt.  Lupita Dawn, RN

## 2017-02-25 ENCOUNTER — Encounter (HOSPITAL_COMMUNITY)
Admission: RE | Disposition: A | Discharge status: Home / Self Care | Payer: Self-pay | Source: Ambulatory Visit | Attending: Internal Medicine

## 2017-02-25 ENCOUNTER — Inpatient Hospital Stay (HOSPITAL_COMMUNITY): Payer: Medicare Other | Admitting: Anesthesiology

## 2017-02-25 ENCOUNTER — Encounter (HOSPITAL_COMMUNITY): Payer: Self-pay

## 2017-02-25 DIAGNOSIS — I4891 Unspecified atrial fibrillation: Secondary | ICD-10-CM

## 2017-02-25 HISTORY — PX: CARDIOVERSION: SHX1299

## 2017-02-25 LAB — BASIC METABOLIC PANEL
Anion gap: 6 (ref 5–15)
BUN: 16 mg/dL (ref 6–20)
CALCIUM: 8.2 mg/dL — AB (ref 8.9–10.3)
CO2: 26 mmol/L (ref 22–32)
CREATININE: 1.29 mg/dL — AB (ref 0.61–1.24)
Chloride: 106 mmol/L (ref 101–111)
GFR calc Af Amer: >60 mL/min (ref >60)
GFR calc non Af Amer: 54 mL/min — ABNORMAL LOW (ref >60)
GLUCOSE: 100 mg/dL — AB (ref 65–99)
Potassium: 4.1 mmol/L (ref 3.5–5.1)
Sodium: 138 mmol/L (ref 135–145)

## 2017-02-25 LAB — MAGNESIUM: Magnesium: 1.9 mg/dL (ref 1.7–2.4)

## 2017-02-25 SURGERY — CARDIOVERSION
Anesthesia: General

## 2017-02-25 MED ORDER — SODIUM CHLORIDE 0.9 % IV SOLN
250.0000 mL | INTRAVENOUS | Status: DC
  Administered 2017-02-25: 11:00:00 via INTRAVENOUS

## 2017-02-25 MED ORDER — SODIUM CHLORIDE 0.9% FLUSH: 3.0000 mL | INTRAVENOUS | Status: DC | PRN

## 2017-02-25 MED ORDER — PHENYLEPHRINE HCL 10 MG/ML IJ SOLN
INTRAMUSCULAR | Status: DC | PRN
  Administered 2017-02-25: 40 ug via INTRAVENOUS

## 2017-02-25 MED ORDER — PROPOFOL 10 MG/ML IV BOLUS
INTRAVENOUS | Status: DC | PRN
  Administered 2017-02-25: 70 mg via INTRAVENOUS

## 2017-02-25 MED ORDER — SODIUM CHLORIDE 0.9% FLUSH: 3.0000 mL | Freq: Two times a day (BID) | INTRAVENOUS | Status: DC

## 2017-02-25 MED ORDER — SOTALOL HCL 120 MG PO TABS: 120.0000 mg | ORAL_TABLET | Freq: Two times a day (BID) | ORAL | 3 refills | Status: DC

## 2017-02-25 NOTE — Care Management Note (Signed)
Case Management Note Marvetta Gibbons RN, BSN Unit 2W-Case Manager [phone]  Patient Details  Name: Joseph Morrow MRN: [id number] Date of Birth: July 03, 1946  Subjective/Objective:   Pt admitted with Afib- sotalol started                 Action/Plan: PTA pt lived at home- anticipate return home- no CM needs noted.   Expected Discharge Date:  02/25/17               Expected Discharge Plan:  Home/Self Care  In-House Referral:     Discharge planning Services  CM Consult  Post Acute Care Choice:  NA Choice offered to:  NA  DME Arranged:    DME Agency:     HH Arranged:    HH Agency:     Status of Service:  Completed, signed off  If discussed at [employer] of Stay Meetings, dates discussed:    Additional Comments:  Dawayne Patricia, RN 02/25/2017, 4:10 PM

## 2017-02-25 NOTE — H&P (View-Only) (Signed)
   SUBJECTIVE: The patient is doing well today.  At this time, he denies chest pain, shortness of breath, or any new concerns.  Marland Kitchen acidophilus  1 capsule Oral Daily  . apixaban  5 mg Oral BID  . folic acid  1 mg Oral Daily  . pantoprazole  40 mg Oral Daily  . sotalol  120 mg Oral Q12H     OBJECTIVE: Physical Exam: Vitals:   02/24/17 0346 02/24/17 1344 02/24/17 1928 02/25/17 0508  BP: 104/67 100/71 107/71 115/80  Pulse: 70 70 70 74  Resp: 17 18 18 18   Temp: 97.8 F (36.6 C) 97.5 F (36.4 C) 98 F (36.7 C) 98.3 F (36.8 C)  TempSrc: Oral Oral Oral Oral  SpO2: 97% 100% 98% 97%  Weight:      Height:       No intake or output data in the 24 hours ending 02/25/17 0819  Telemetry reveals afib with V pacing  GEN- The patient is well appearing, alert and oriented x 3 today.   Head- normocephalic, atraumatic Eyes-  Sclera clear, conjunctiva pink Ears- hearing intact Oropharynx- clear Neck- supple,  Lungs- Clear to ausculation bilaterally, normal work of breathing Heart- Regular rate and rhythm (paced) GI- soft, NT, ND, + BS Extremities- no clubbing, cyanosis, or edema Skin- no rash or lesion Psych- euthymic mood, full affect Neuro- strength and sensation are intact  LABS: Basic Metabolic Panel:  Recent Labs  02/24/17 0331 02/25/17 0308  NA 138 138  K 3.9 4.1  CL 107 106  CO2 24 26  GLUCOSE 98 100*  BUN 17 16  CREATININE 1.20 1.29*  CALCIUM 8.0* 8.2*  MG 1.8 1.9   ASSESSMENT AND PLAN:   1. Persistent afib Doing well with sotalol QTc is 510 msec this am On eliquis NPO for possible cardioversion today.  Risks of procedure discussed with the patient  2. Moderate MR Stable No change required today  3. HCM Stable No change required today  4. VT Stable No change required today     [name], MD 02/25/2017 8:19 AM

## 2017-02-25 NOTE — Anesthesia Postprocedure Evaluation (Signed)
Anesthesia Post Note  Patient: Joseph Morrow  Procedure(s) Performed: Procedure(s) (LRB): CARDIOVERSION (N/A)     Patient location during evaluation: Endoscopy Anesthesia Type: General Level of consciousness: awake, awake and alert and oriented Pain management: pain level controlled Vital Signs Assessment: post-procedure vital signs reviewed and stable Respiratory status: spontaneous breathing, nonlabored ventilation and respiratory function stable Cardiovascular status: blood pressure returned to baseline Anesthetic complications: no    Last Vitals:  Vitals:   02/25/17 1120 02/25/17 1249  BP: 103/80 101/69  Pulse:  (!) 59  Resp: 17 18  Temp:  36.6 C    Last Pain:  Vitals:   02/25/17 1249  TempSrc: Oral  PainSc:                  [name] COKER

## 2017-02-25 NOTE — Progress Notes (Signed)
Pt. Discharged to home with wife Pt. D/C'd via wheelchair with NT Discharge information reviewed and given All personal belongings given to Pt.  Education discussed with teach back IV was d/c by IV team RN Tele d/c

## 2017-02-25 NOTE — Anesthesia Preprocedure Evaluation (Addendum)
Anesthesia Evaluation  Patient identified by MRN, date of birth, ID band Patient awake    Reviewed: Allergy & Precautions, NPO status , Patient's Chart, lab work & pertinent test results  Airway Mallampati: I  TM Distance: >3 FB     Dental  (+) Teeth Intact, Dental Advisory Given   Pulmonary former smoker,    breath sounds clear to auscultation       Cardiovascular + dysrhythmias Atrial Fibrillation + Cardiac Defibrillator  Rhythm:Irregular Rate:Normal     Neuro/Psych    GI/Hepatic GERD  Medicated and Controlled,  Endo/Other    Renal/GU      Musculoskeletal   Abdominal   Peds  Hematology   Anesthesia Other Findings   Reproductive/Obstetrics                            Anesthesia Physical Anesthesia Plan  ASA: III  Anesthesia Plan: General   Post-op Pain Management:    Induction: Intravenous  PONV Risk Score and Plan: Propofol  Airway Management Planned: Mask  Additional Equipment:   Intra-op Plan:   Post-operative Plan:   Informed Consent: I have reviewed the patients History and Physical, chart, labs and discussed the procedure including the risks, benefits and alternatives for the proposed anesthesia with the patient or authorized representative who has indicated his/her understanding and acceptance.     Plan Discussed with: CRNA and Anesthesiologist  Anesthesia Plan Comments:         Anesthesia Quick Evaluation

## 2017-02-25 NOTE — Transfer of Care (Signed)
Immediate Anesthesia Transfer of Care Note  Patient: Joseph Morrow  Procedure(s) Performed: Procedure(s): CARDIOVERSION (N/A)  Patient Location: PACU and Endoscopy Unit  Anesthesia Type:General  Level of Consciousness: awake, alert  and oriented  Airway & Oxygen Therapy: Patient Spontanous Breathing and Patient connected to nasal cannula oxygen  Post-op Assessment: Report given to RN, Post -op Vital signs reviewed and stable and Patient moving all extremities X 4  Post vital signs: Reviewed and stable  Last Vitals:  Vitals:   02/25/17 1108 02/25/17 1109  BP:  (!) 94/59  Pulse: (!) 55 (!) 55  Resp: 17 18  Temp:      Last Pain:  Vitals:   02/25/17 1014  TempSrc: Oral  PainSc:          Complications: No apparent anesthesia complications

## 2017-02-25 NOTE — Discharge Instructions (Signed)

## 2017-02-25 NOTE — Interval H&P Note (Signed)
History and Physical Interval Note:  02/25/2017 10:40 AM  Joseph Morrow  has presented today for surgery, with the diagnosis of a fib  The various methods of treatment have been discussed with the patient and family. After consideration of risks, benefits and other options for treatment, the patient has consented to  Procedure(s): CARDIOVERSION (N/A) as a surgical intervention .  The patient's history has been reviewed, patient examined, no change in status, stable for surgery.  I have reviewed the patient's chart and labs.  Questions were answered to the patient's satisfaction.     Dorris Carnes

## 2017-02-25 NOTE — Discharge Summary (Signed)
ELECTROPHYSIOLOGY PROCEDURE DISCHARGE SUMMARY    Patient ID: Joseph Morrow,  MRN: [id number], DOB/AGE: 12-03-1945 71 y.o.  Admit date: 02/23/2017 Discharge date: 02/25/2017  Primary Care Physician: Marton Redwood, MD  Primary Cardiologist: Dr. Marlou Porch Electrophysiologist: Dr. Rayann Heman  Primary Discharge Diagnosis:  1.  Sotalol loading this admission  Secondary Discharge Diagnosis:  1. Persistent Afib     CHA2DS2Vasc is one on Eliquis 2. HCM w/ICD   Allergies  Allergen Reactions  . Dextromethorphan Other (See Comments)    nervous  . Versed [Midazolam] Other (See Comments)    "hallucinations, headache"  . Antihistamines, Diphenhydramine-Type Anxiety     Procedures This Admission:  1.  Sotalol loading 2.  Direct current cardioversion on 02/25/17 by Dr Harrington Challenger which successfully restored SR.  There were no early apparent complications.   Brief HPI: [name] is a 71 y.o. male with a past medical history as noted above.  They were referred to EP in the outpatient setting for treatment options of atrial fibrillation.  Risks, benefits, and alternatives to Sotalol were reviewed with the patient who wished to proceed.    Hospital Course:  The patient was admitted and Tikosyn was initiated.  Renal function and electrolytes were followed during the hospitalization.  His QTc remained stable.  On 02/25/17 he underwent direct current cardioversion which restored sinus rhythm.  He was monitored until discharge on telemetry which demonstrated SR.  On the day of discharge, he was examined today by Dr Rayann Heman.  Post cardioversion the patient is feeling very well without complaints. EKG post DCCV is SR/ with stable QT, noting T wave memory, and cleared for discharge by Dr. Rayann Heman.  Follow-up has been arranged with the AFib clinic in 1 week for EKG, labs, visit and with Dr. Rayann Heman in 4 weeks.  The patient is reminded of the importance of not missing any of his Eliquis.  Physical  Exam: Vitals:   02/25/17 1110 02/25/17 1113 02/25/17 1120 02/25/17 1249  BP: 91/64  103/80 101/69  Pulse: (!) 55 (!) 55  (!) 59  Resp: 14 13 17 18   Temp:  97.8 F (36.6 C)  97.9 F (36.6 C)  TempSrc:  Oral  Oral  SpO2: 99% 98% 99% 99%  Weight:      Height:         Labs:   Lab Results  Component Value Date   WBC 6.1 09/03/2016   HGB 15.3 09/03/2016   HCT 44.2 09/03/2016   MCV 88.8 09/03/2016   PLT 179 09/03/2016     Recent Labs Lab 02/25/17 0308  NA 138  K 4.1  CL 106  CO2 26  BUN 16  CREATININE 1.29*  CALCIUM 8.2*  GLUCOSE 100*     Discharge Medications:  Allergies as of 02/25/2017      Reactions   Dextromethorphan Other (See Comments)   nervous   Versed [midazolam] Other (See Comments)   "hallucinations, headache"   Antihistamines, Diphenhydramine-type Anxiety      Medication List    TAKE these medications   acidophilus Caps capsule Take 1 capsule by mouth daily.   apixaban 5 MG Tabs tablet Commonly known as:  ELIQUIS Take 1 tablet (5 mg total) by mouth 2 (two) times daily.   atenolol 50 MG tablet Commonly known as:  TENORMIN Take 1 tablet (50 mg total) by mouth daily.   Co Q 10 100 MG Caps Take 100 mg by mouth daily.   folic acid 1 MG tablet Commonly known  as:  FOLVITE Take 1 mg by mouth daily.   glucosamine-chondroitin 500-400 MG tablet Take 1 tablet by mouth daily.   pantoprazole 40 MG tablet Commonly known as:  PROTONIX Take 40 mg by mouth daily.   sotalol 120 MG tablet Commonly known as:  BETAPACE Take 1 tablet (120 mg total) by mouth 2 (two) times daily.   Zinc 50 MG Caps Take 50 mg by mouth daily.       Disposition: Home Discharge Instructions    Diet - low sodium heart healthy    Complete by:  As directed    Increase activity slowly    Complete by:  As directed      Follow-up Information    [facility] Follow up on 03/04/2017.   Specialty:  Cardiology Why:  9:00AM Contact  information: [address] [id number] [place] [phone]       [name], MD Follow up on 04/05/2017.   Specialty:  Cardiology Why:  4:30PM Contact information: [place] [address] [phone]           Duration of Discharge Encounter: Greater than 30 minutes including physician time.  Signed, Tommye Standard, PA-C 02/25/2017 3:37 PM   I have seen, examined the patient, and reviewed the above assessment and plan.  QT is stable.  Changes to above are made where necessary.  DC to home with close outpatient follow-up.  Co Sign: [name], MD 02/25/2017 6:05 PM

## 2017-02-25 NOTE — Op Note (Signed)
Patient anesthetized with propofol With pads in AP position pt cardioverted to SR with 200 J synchronized biphasic energy Procedure without complications

## 2017-02-25 NOTE — Progress Notes (Signed)
   SUBJECTIVE: The patient is doing well today.  At this time, he denies chest pain, shortness of breath, or any new concerns.  Marland Kitchen acidophilus  1 capsule Oral Daily  . apixaban  5 mg Oral BID  . folic acid  1 mg Oral Daily  . pantoprazole  40 mg Oral Daily  . sotalol  120 mg Oral Q12H     OBJECTIVE: Physical Exam: Vitals:   02/24/17 0346 02/24/17 1344 02/24/17 1928 02/25/17 0508  BP: 104/67 100/71 107/71 115/80  Pulse: 70 70 70 74  Resp: 17 18 18 18   Temp: 97.8 F (36.6 C) 97.5 F (36.4 C) 98 F (36.7 C) 98.3 F (36.8 C)  TempSrc: Oral Oral Oral Oral  SpO2: 97% 100% 98% 97%  Weight:      Height:       No intake or output data in the 24 hours ending 02/25/17 0819  Telemetry reveals afib with V pacing  GEN- The patient is well appearing, alert and oriented x 3 today.   Head- normocephalic, atraumatic Eyes-  Sclera clear, conjunctiva pink Ears- hearing intact Oropharynx- clear Neck- supple,  Lungs- Clear to ausculation bilaterally, normal work of breathing Heart- Regular rate and rhythm (paced) GI- soft, NT, ND, + BS Extremities- no clubbing, cyanosis, or edema Skin- no rash or lesion Psych- euthymic mood, full affect Neuro- strength and sensation are intact  LABS: Basic Metabolic Panel:  Recent Labs  02/24/17 0331 02/25/17 0308  NA 138 138  K 3.9 4.1  CL 107 106  CO2 24 26  GLUCOSE 98 100*  BUN 17 16  CREATININE 1.20 1.29*  CALCIUM 8.0* 8.2*  MG 1.8 1.9   ASSESSMENT AND PLAN:   1. Persistent afib Doing well with sotalol QTc is 510 msec this am On eliquis NPO for possible cardioversion today.  Risks of procedure discussed with the patient  2. Moderate MR Stable No change required today  3. HCM Stable No change required today  4. VT Stable No change required today     [name], MD 02/25/2017 8:19 AM

## 2017-02-27 ENCOUNTER — Encounter (HOSPITAL_COMMUNITY): Payer: Self-pay | Admitting: Internal Medicine

## 2017-03-04 ENCOUNTER — Encounter (HOSPITAL_COMMUNITY): Payer: Self-pay | Admitting: Nurse Practitioner

## 2017-03-04 ENCOUNTER — Ambulatory Visit (HOSPITAL_COMMUNITY)
Admit: 2017-03-04 | Discharge: 2017-03-04 | Disposition: A | Discharge status: Home / Self Care | Payer: Medicare Other | Source: Ambulatory Visit | Attending: Nurse Practitioner | Admitting: Nurse Practitioner

## 2017-03-04 VITALS — BP 122/74 | HR 55 | Ht 70.0 in | Wt 180.4 lb

## 2017-03-04 DIAGNOSIS — Z8249 Family history of ischemic heart disease and other diseases of the circulatory system: Secondary | ICD-10-CM | POA: Diagnosis not present

## 2017-03-04 DIAGNOSIS — Z79899 Other long term (current) drug therapy: Secondary | ICD-10-CM | POA: Diagnosis not present

## 2017-03-04 DIAGNOSIS — Z833 Family history of diabetes mellitus: Secondary | ICD-10-CM | POA: Insufficient documentation

## 2017-03-04 DIAGNOSIS — I480 Paroxysmal atrial fibrillation: Secondary | ICD-10-CM | POA: Diagnosis not present

## 2017-03-04 DIAGNOSIS — Z7901 Long term (current) use of anticoagulants: Secondary | ICD-10-CM | POA: Diagnosis not present

## 2017-03-04 DIAGNOSIS — I422 Other hypertrophic cardiomyopathy: Secondary | ICD-10-CM | POA: Diagnosis not present

## 2017-03-04 DIAGNOSIS — Z95810 Presence of automatic (implantable) cardiac defibrillator: Secondary | ICD-10-CM | POA: Insufficient documentation

## 2017-03-04 DIAGNOSIS — Z888 Allergy status to other drugs, medicaments and biological substances status: Secondary | ICD-10-CM | POA: Insufficient documentation

## 2017-03-04 DIAGNOSIS — I481 Persistent atrial fibrillation: Secondary | ICD-10-CM | POA: Diagnosis not present

## 2017-03-04 DIAGNOSIS — I4819 Other persistent atrial fibrillation: Secondary | ICD-10-CM

## 2017-03-04 DIAGNOSIS — K219 Gastro-esophageal reflux disease without esophagitis: Secondary | ICD-10-CM | POA: Diagnosis not present

## 2017-03-04 DIAGNOSIS — Z87891 Personal history of nicotine dependence: Secondary | ICD-10-CM | POA: Diagnosis not present

## 2017-03-04 LAB — BASIC METABOLIC PANEL
ANION GAP: 6 (ref 5–15)
BUN: 21 mg/dL — ABNORMAL HIGH (ref 6–20)
CALCIUM: 8.9 mg/dL (ref 8.9–10.3)
CO2: 29 mmol/L (ref 22–32)
CREATININE: 1.23 mg/dL (ref 0.61–1.24)
Chloride: 106 mmol/L (ref 101–111)
GFR, EST NON AFRICAN AMERICAN: 57 mL/min — AB (ref >60)
Glucose, Bld: 111 mg/dL — ABNORMAL HIGH (ref 65–99)
Potassium: 4.3 mmol/L (ref 3.5–5.1)
SODIUM: 141 mmol/L (ref 135–145)

## 2017-03-04 LAB — MAGNESIUM: Magnesium: 1.8 mg/dL (ref 1.7–2.4)

## 2017-03-04 MED ORDER — FUROSEMIDE 20 MG PO TABS: 20.0000 mg | ORAL_TABLET | Freq: Every day | ORAL | 6 refills | Status: DC

## 2017-03-04 NOTE — Patient Instructions (Signed)
Your physician has recommended you make the following change in your medication:  1)Start Lasix 20mg  daily for the next 3 days if weight stablized may go to as needed only -- if not continue daily.

## 2017-03-04 NOTE — Progress Notes (Signed)
Primary Care Physician: Marton Redwood, MD Referring [place] Triage EP: Dr. Everlean Patterson Mano is a 71 y.o. male with a h/o hypertrophic cardiomyopathy, s/p ICD for VT, remote paroxysmal afib and most recent increase of afib burden to 18%, March 2018, but in persistent afib since a week ago  Saturday.He has noted increase in HR with activity and some fatigue but other wise, heart rates are mostly controlled on current dose of atenolol. He is very active and does not like to see his heart rate go up with activity. He would very much like to restore SR. He just started on eliquis 5 mg bid, first full day Saturday May 19, for chadsvasc score of 2(age, LV dysfunction). Last echo was 2014.Had generator change out in January.  F/u was hospitalized for sotalol administration 6/12 to 6/14. He returns today, 6/21 in SR and feels improved. Qtc stable. He has noted LLE since being home on sotalol with walking.   Today, he denies symptoms of   chest pain, shortness of breath, orthopnea, PND, lower extremity edema, dizziness, presyncope, syncope, or neurologic sequela.  Positive for palpitations and fatigue.The patient is tolerating medications without difficulties and is otherwise without complaint today.   Past Medical History:  Diagnosis Date  . Acute cholecystitis 06/21/2011  . 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 Allergy    SEASONAL  . Cataract   . Complication of anesthesia    HALLUCINATIONS  . GERD (gastroesophageal reflux disease)   . Hearing loss   . Hiatal hernia   . Hypertrophic cardiomyopathy ([place])   . Mitral regurgitation    Moderate  . Paroxysmal atrial fibrillation ([place]) 04/2015   documented on ICD remote interrogation  . Septal defect    LV Septal hyptertrophy 86mm, normal EF, minimal LVOT gradiant.  . VT (ventricular tachycardia) ([place]) 08/18/2013   s/p St. Jude ICD   Past Surgical History:  Procedure Laterality Date  .  APPENDECTOMY  1950  . CARDIOVERSION N/A 02/25/2017   Procedure: CARDIOVERSION;  Surgeon: Fay Records, MD;  Location: [facility];  Service: Cardiovascular;  Laterality: N/A;  . CHOLECYSTECTOMY  06/24/2011  . COLONOSCOPY    . EP IMPLANTABLE DEVICE N/A 09/03/2016   [facility] Fortify [address] Dr generator change by Dr Rayann Heman.  ICD implanted for secondary prevention of sudden death  . IMPLANTABLE CARDIOVERTER DEFIBRILLATOR IMPLANT  08/18/2013   St. Jude ICD, serial # [id number]  . IMPLANTABLE CARDIOVERTER DEFIBRILLATOR IMPLANT N/A 08/18/2013   Procedure: IMPLANTABLE CARDIOVERTER DEFIBRILLATOR IMPLANT;  Surgeon: Coralyn Mark, MD;  Location: [facility] CATH LAB;  Service: Cardiovascular;  Laterality: N/A;    Current Outpatient Prescriptions  Medication Sig Dispense Refill  . acidophilus (RISAQUAD) CAPS capsule Take 1 capsule by mouth daily.    Marland Kitchen apixaban (ELIQUIS) 5 MG TABS tablet Take 1 tablet (5 mg total) by mouth 2 (two) times daily. 60 tablet 6  . Coenzyme Q10 (CO Q 10) 100 MG CAPS Take 100 mg by mouth daily.     . folic acid (FOLVITE) 1 MG tablet Take 1 mg by mouth daily.     Marland Kitchen glucosamine-chondroitin 500-400 MG tablet Take 1 tablet by mouth daily.    . pantoprazole (PROTONIX) 40 MG tablet Take 40 mg by mouth daily.    . sotalol (BETAPACE) 120 MG tablet Take 1 tablet (120 mg total) by mouth 2 (two) times daily. 60 tablet 3  . Zinc 50 MG CAPS Take 50 mg by mouth daily.     Marland Kitchen  furosemide (LASIX) 20 MG tablet Take 1 tablet (20 mg total) by mouth daily. 30 tablet 6   No current facility-administered medications for this encounter.     Allergies  Allergen Reactions  . Dextromethorphan Other (See Comments)    nervous  . Versed [Midazolam] Other (See Comments)    "hallucinations, headache"  . Antihistamines, Diphenhydramine-Type Anxiety    Social History   Social History  . Marital status: Married    Spouse name: N/A  . Number of children: 2  . Years of education: N/A   Occupational History  .  retired    Social History Main Topics  . Smoking status: Former Smoker    Packs/day: 2.00    Years: 20.00    Types: Cigarettes    Quit date: 07/06/1981  . Smokeless tobacco: Never Used  . Alcohol use 0.0 oz/week     Comment: 2 glasses per day  . Drug use: No  . Sexual activity: Not on file   Other Topics Concern  . Not on file   Social History Narrative   Originally from [place]. Moved to [place] in 2012. Currently has no pets. No bird or hot tub exposure. Previously worked running [employer]. He also worked as a Air cabin crew. He did have exposure to Beneze, Acetone, Styrene, & Hydrocarbon early in his career.    Family History  Problem Relation Age of Onset  . Hypertrophic cardiomyopathy Mother   . Arrhythmia Mother   . Heart failure Mother   . Pneumonia Father   . Diabetes Father   . Colon polyps Father   . Heart attack Brother   . Hypertrophic cardiomyopathy Brother   . Heart Problems Brother        VAD  . Arrhythmia Brother   . Diabetes Brother   . Sudden death Brother   . Lung disease Neg Hx     ROS- All systems are reviewed and negative except as per the HPI above  Physical Exam: Vitals:   03/04/17 0914  BP: 122/74  Pulse: (!) 55  Weight: 180 lb 6.4 oz (81.8 kg)  Height: 5\' 10"  (1.778 m)   Wt Readings from Last 3 Encounters:  03/04/17 180 lb 6.4 oz (81.8 kg)  02/23/17 177 lb 14.4 oz (80.7 kg)  02/15/17 179 lb (81.2 kg)    Labs: Lab Results  Component Value Date   NA 141 03/04/2017   K 4.3 03/04/2017   CL 106 03/04/2017   CO2 29 03/04/2017   GLUCOSE 111 (H) 03/04/2017   BUN 21 (H) 03/04/2017   CREATININE 1.23 03/04/2017   CALCIUM 8.9 03/04/2017   MG 1.8 03/04/2017   No results found for: INR No results found for: CHOL, HDL, LDLCALC, TRIG   GEN- The patient is well appearing, alert and oriented x 3 today.   Head- normocephalic, atraumatic Eyes-  Sclera clear, conjunctiva pink Ears- hearing intact Oropharynx- clear Neck- supple, no  JVP Lymph- no cervical lymphadenopathy Lungs- Clear to ausculation bilaterally, normal work of breathing Heart-regular rate and rhythm, no murmurs, rubs or gallops, PMI not laterally displaced GI- soft, NT, ND, + BS Extremities- no clubbing, cyanosis, or edema MS- no significant deformity or atrophy Skin- no rash or lesion Psych- euthymic mood, full affect Neuro- strength and sensation are intact  EKG-atrial paced rhythm but appears to be SR, pr int 272 ms, qrs int 62 ms, qtc 433 ms . Echo- 2018-------------------------------------------------------------------- Study Conclusions  - Left ventricle: The cavity size was normal. There was moderate  asymmetric hypertrophy of the septum. The appearance was   consistent with hypertrophic cardiomyopathy. Systolic function   was vigorous. The estimated ejection fraction was in the range of   65% to 70%. There was no dynamic obstruction. Wall motion was   normal; there were no regional wall motion abnormalities. - Mitral valve: There was mild to moderate regurgitation directed   centrally. - Left atrium: The atrium was severely dilated. - Right ventricle: The cavity size was mildly dilated. - Right atrium: The atrium was mildly dilated. - Tricuspid valve: There was moderate regurgitation. - Pulmonary arteries: Systolic pressure was mildly increased. PA   peak pressure: 41 mm Hg (S).   Assessment and Plan: 1. Paroxysmal afib Increase in burden since March of this year, but with recent Sotalol initiation restoring SR Continue Sotalol 120 mg bid Continue eliquis 5 mg bid  Atenolol d/c'ed with initiation of sotalol Increase in pedal edema with sotalol, discussed with Dr. Rayann Heman who feels with recent afib burden, heart function may have declined but will probably improve with staying in SR Recommended lasix and will start 20 mg to start x 3 days, if weight returns to normal(up 2 lbs form last visit here) and edema improves then may use as  needed Return next week for bmet/mag only    Butch Penny C. Caoimhe Damron, [facility] [address] [place], [place] [address] [phone]

## 2017-03-09 NOTE — Addendum Note (Signed)
Encounter addended by: Sherran Needs, NP on: 03/09/2017  9:29 AM<BR>    Actions taken: LOS modified

## 2017-03-10 ENCOUNTER — Ambulatory Visit (INDEPENDENT_AMBULATORY_CARE_PROVIDER_SITE_OTHER): Payer: Medicare Other | Admitting: *Deleted

## 2017-03-10 ENCOUNTER — Ambulatory Visit (HOSPITAL_COMMUNITY)
Admission: RE | Admit: 2017-03-10 | Discharge: 2017-03-10 | Disposition: A | Discharge status: Home / Self Care | Payer: Medicare Other | Source: Ambulatory Visit | Attending: Nurse Practitioner | Admitting: Nurse Practitioner

## 2017-03-10 DIAGNOSIS — I472 Ventricular tachycardia, unspecified: Secondary | ICD-10-CM

## 2017-03-10 DIAGNOSIS — Z7901 Long term (current) use of anticoagulants: Secondary | ICD-10-CM | POA: Insufficient documentation

## 2017-03-10 DIAGNOSIS — Z79899 Other long term (current) drug therapy: Secondary | ICD-10-CM | POA: Insufficient documentation

## 2017-03-10 DIAGNOSIS — I480 Paroxysmal atrial fibrillation: Secondary | ICD-10-CM | POA: Diagnosis not present

## 2017-03-10 LAB — BASIC METABOLIC PANEL
Anion gap: 7 (ref 5–15)
BUN: 18 mg/dL (ref 6–20)
CHLORIDE: 104 mmol/L (ref 101–111)
CO2: 27 mmol/L (ref 22–32)
Calcium: 9.4 mg/dL (ref 8.9–10.3)
Creatinine, Ser: 1.25 mg/dL — ABNORMAL HIGH (ref 0.61–1.24)
GFR calc Af Amer: >60 mL/min (ref >60)
GFR, EST NON AFRICAN AMERICAN: 56 mL/min — AB (ref >60)
GLUCOSE: 116 mg/dL — AB (ref 65–99)
POTASSIUM: 4.3 mmol/L (ref 3.5–5.1)
Sodium: 138 mmol/L (ref 135–145)

## 2017-03-10 LAB — MAGNESIUM: Magnesium: 2.1 mg/dL (ref 1.7–2.4)

## 2017-03-10 NOTE — Progress Notes (Signed)
Remote ICD transmission.   

## 2017-03-11 ENCOUNTER — Encounter: Payer: Self-pay | Admitting: Cardiology

## 2017-03-11 LAB — CUP PACEART REMOTE DEVICE CHECK
Brady Statistic AP VS Percent: 46 %
Brady Statistic AS VP Percent: 1 %
Brady Statistic AS VS Percent: 53 %
Brady Statistic RA Percent Paced: 46 %
Date Time Interrogation Session: 20180627060510
HIGH POWER IMPEDANCE MEASURED VALUE: 44 Ohm
HighPow Impedance: 44 Ohm
Implantable Lead Implant Date: 20141205
Implantable Lead Location: 753860
Lead Channel Impedance Value: 440 Ohm
Lead Channel Pacing Threshold Amplitude: 0.5 V
Lead Channel Pacing Threshold Amplitude: 1 V
Lead Channel Pacing Threshold Pulse Width: 0.5 ms
Lead Channel Sensing Intrinsic Amplitude: 3.7 mV
Lead Channel Setting Pacing Pulse Width: 0.5 ms
Lead Channel Setting Sensing Sensitivity: 0.5 mV
MDC IDC LEAD IMPLANT DT: 20141205
MDC IDC LEAD LOCATION: 753859
MDC IDC MSMT BATTERY REMAINING LONGEVITY: 93 mo
MDC IDC MSMT BATTERY REMAINING PERCENTAGE: 92 %
MDC IDC MSMT BATTERY VOLTAGE: 3.17 V
MDC IDC MSMT LEADCHNL RV IMPEDANCE VALUE: 480 Ohm
MDC IDC MSMT LEADCHNL RV PACING THRESHOLD PULSEWIDTH: 0.5 ms
MDC IDC MSMT LEADCHNL RV SENSING INTR AMPL: 12 mV
MDC IDC PG IMPLANT DT: 20171221
MDC IDC SET LEADCHNL RA PACING AMPLITUDE: 2 V
MDC IDC SET LEADCHNL RV PACING AMPLITUDE: 2.5 V
MDC IDC STAT BRADY AP VP PERCENT: 1 %
MDC IDC STAT BRADY RV PERCENT PACED: 1 %
Pulse Gen Serial Number: 7387049

## 2017-03-25 ENCOUNTER — Ambulatory Visit (INDEPENDENT_AMBULATORY_CARE_PROVIDER_SITE_OTHER)
Admission: RE | Admit: 2017-03-25 | Discharge: 2017-03-25 | Disposition: A | Discharge status: Home / Self Care | Payer: Medicare Other | Source: Ambulatory Visit | Attending: Pulmonary Disease | Admitting: Pulmonary Disease

## 2017-03-25 DIAGNOSIS — R918 Other nonspecific abnormal finding of lung field: Secondary | ICD-10-CM | POA: Diagnosis not present

## 2017-03-26 NOTE — Progress Notes (Signed)
Spoke with patient and informed him of results. Pt verbalized understanding and did not have any questions. Nothing further is needed.

## 2017-03-31 ENCOUNTER — Ambulatory Visit (INDEPENDENT_AMBULATORY_CARE_PROVIDER_SITE_OTHER): Payer: Medicare Other | Admitting: Pulmonary Disease

## 2017-03-31 ENCOUNTER — Encounter: Payer: Self-pay | Admitting: Pulmonary Disease

## 2017-03-31 VITALS — BP 98/80 | HR 56 | Ht 70.0 in | Wt 171.2 lb

## 2017-03-31 DIAGNOSIS — R05 Cough: Secondary | ICD-10-CM

## 2017-03-31 DIAGNOSIS — R918 Other nonspecific abnormal finding of lung field: Secondary | ICD-10-CM

## 2017-03-31 DIAGNOSIS — R059 Cough, unspecified: Secondary | ICD-10-CM

## 2017-03-31 DIAGNOSIS — K219 Gastro-esophageal reflux disease without esophagitis: Secondary | ICD-10-CM

## 2017-03-31 NOTE — Progress Notes (Signed)
Subjective:    Patient ID: Joseph Morrow, male    DOB: Sep 12, 1946, 71 y.o.   MRN: [id number]  C.C.:  Follow-up for Cough/Possible Asthma, Right Middle Lobe Nodules, & GERD.  HPI Right middle lobe nodules: Seen initially on CT imaging July 2017. Significant improvement on repeat imaging this month.  Cough/possible asthma: Previously patient did demonstrate a significant bronchodilator response on spirometry. Still has an intermittent, infrequent cough. No wheezing. Denies any dyspnea.   GERD with hiatal hernia: At last appointment patient had quit taking Zantac but was continuing to take Protonix. Patient admitted to eating spicy foods frequently. He is still compliant with Protonix. He denies any recent reflux or dyspepsia. No morning brash water taste.   Review of Systems He reports no recent palpitations. No chest pain or tightness. No fever or chills. No rashes or abnormal bruising.   Allergies  Allergen Reactions  . Dextromethorphan Other (See Comments)    nervous  . Versed [Midazolam] Other (See Comments)    "hallucinations, headache"  . Antihistamines, Diphenhydramine-Type Anxiety    Current Outpatient Prescriptions on File Prior to Visit  Medication Sig Dispense Refill  . acidophilus (RISAQUAD) CAPS capsule Take 1 capsule by mouth daily.    Marland Kitchen apixaban (ELIQUIS) 5 MG TABS tablet Take 1 tablet (5 mg total) by mouth 2 (two) times daily. 60 tablet 6  . Coenzyme Q10 (CO Q 10) 100 MG CAPS Take 100 mg by mouth daily.     . folic acid (FOLVITE) 1 MG tablet Take 1 mg by mouth daily.     Marland Kitchen glucosamine-chondroitin 500-400 MG tablet Take 1 tablet by mouth daily.    . pantoprazole (PROTONIX) 40 MG tablet Take 40 mg by mouth daily.    . sotalol (BETAPACE) 120 MG tablet Take 1 tablet (120 mg total) by mouth 2 (two) times daily. 60 tablet 3  . Zinc 50 MG CAPS Take 50 mg by mouth daily.      No current facility-administered medications on file prior to visit.     Past Medical  History:  Diagnosis Date  . Acute cholecystitis 06/21/2011  . 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 Allergy    SEASONAL  . Cataract   . Complication of anesthesia    HALLUCINATIONS  . GERD (gastroesophageal reflux disease)   . Hearing loss   . Hiatal hernia   . Hypertrophic cardiomyopathy ([place])   . Mitral regurgitation    Moderate  . Paroxysmal atrial fibrillation ([place]) 04/2015   documented on ICD remote interrogation  . Septal defect    LV Septal hyptertrophy 89mm, normal EF, minimal LVOT gradiant.  . VT (ventricular tachycardia) ([place]) 08/18/2013   s/p St. Jude ICD    Past Surgical History:  Procedure Laterality Date  . APPENDECTOMY  1950  . CARDIOVERSION N/A 02/25/2017   Procedure: CARDIOVERSION;  Surgeon: Fay Records, MD;  Location: [place];  Service: Cardiovascular;  Laterality: N/A;  . CHOLECYSTECTOMY  06/24/2011  . COLONOSCOPY    . EP IMPLANTABLE DEVICE N/A 09/03/2016   SJM Fortify Assura Dr generator change by Dr Rayann Heman.  ICD implanted for secondary prevention of sudden death  . IMPLANTABLE CARDIOVERTER DEFIBRILLATOR IMPLANT  08/18/2013   St. Jude ICD, serial # [id number]  . IMPLANTABLE CARDIOVERTER DEFIBRILLATOR IMPLANT N/A 08/18/2013   Procedure: IMPLANTABLE CARDIOVERTER DEFIBRILLATOR IMPLANT;  Surgeon: Coralyn Mark, MD;  Location: [facility] CATH [facility];  Service: Cardiovascular;  Laterality: N/A;    Family History  Problem Relation Age of Onset  .  Hypertrophic cardiomyopathy Mother   . Arrhythmia Mother   . Heart failure Mother   . Pneumonia Father   . Diabetes Father   . Colon polyps Father   . Heart attack Brother   . Hypertrophic cardiomyopathy Brother   . Heart Problems Brother        VAD  . Arrhythmia Brother   . Diabetes Brother   . Sudden death Brother   . Lung disease Neg Hx     Social History   Social History  . Marital status: Married    Spouse name: N/A  . Number of children: 2  . Years of education: N/A    Occupational History  . retired    Social History Main Topics  . Smoking status: Former Smoker    Packs/day: 2.00    Years: 20.00    Types: Cigarettes    Quit date: 07/06/1981  . Smokeless tobacco: Never Used  . Alcohol use 0.0 oz/week     Comment: 2 glasses per day  . Drug use: No  . Sexual activity: Not Asked   Other Topics Concern  . None   Social History Narrative   Originally from [place]. Moved to [place] in 2012. Currently has no pets. No bird or hot tub exposure. 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 He did have exposure to Beneze, Acetone, Styrene, & Hydrocarbon early in his career.      Objective:   Physical Exam BP 98/80 (BP Location: Right Arm, Patient Position: Sitting, Cuff Size: Normal)   Pulse (!) 56   Ht 5\' 10"  (1.778 m)   Wt 171 lb 3.2 oz (77.7 kg)   SpO2 97%   BMI 24.56 kg/m   General:  Awake. Alert. No acute distress. Caucasian male. Integument:  Warm & dry. No rash on exposed skin.  Extremities:  No cyanosis or clubbing.  HEENT:  Moist mucus membranes. Mild bilateral nasal turbinate swelling left greater than right. No oral ulcers. Cardiovascular:  Regular rate. No edema. Unable to appreciate JVD.  Pulmonary:  Good aeration & clear to auscultation bilaterally. Symmetric chest wall expansion. No accessory muscle use on room air. Abdomen: Soft. Normal bowel sounds. Nondistended.  Musculoskeletal:  Normal bulk and tone. No joint deformity or effusion appreciated.  PFT 06/17/16: FVC 3.65 L (83%) FEV1 2.71 L (84%) FEV1/FVC 0.74 FEF 25-75 2.01 L (83%) negative bronchodilator response 04/17/16: FVC 3.46 L (78%) FEV1 2.49 L (77%) FEV1/FVC 0.72 FEF 25-75 1.93 L (79%) positive bronchodilator response TLC 6.36 L (90%) RV 113% ERV 62% DLCO uncorrected 80% (Hgb 15.7)  IMAGING CT CHEST W/O 03/25/17 (personally reviewed by me):  Significant improvement in the nodules within right middle lobe. No new nodule or involving  nodule/mass appreciated. No pleural effusion or thickening. No pericardial effusion. No pathologic mediastinal adenopathy.  CT CHEST W/O 06/24/16 (previously reviewed by me): No pathologic mediastinal adenopathy. No pericardial effusion. No pleural effusion or thickening. Cluster of nodules within right middle lobe unchanged does appear less solid than previously on imaging. Largest nodule measuring 5 mm.  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 No hiatal hernia or mucosal abnormality. No reflux.   CT CHEST W/O 03/20/16 (previously reviewed by me): Subcentimeter cluster of nodules right middle lobe. Largest nodule measures 5 mm. No evidence of or suggestion of bronchiectasis. No pleural effusion or thickening. No pericardial effusion. No pathologic mediastinal adenopathy.  CXR PA/LAT 01/20/16 (per radiologist): Heart normal in size. No edema, mass, or  effusion. Pacemaker noted.  CXR PA/LAT 11/22/15 (previously reviewed by me): Mild flattening of the diaphragms suggestive of hyperinflation. Implanted device noted in left upper chest with leads. No parenchymal nodule or opacity. No pleural effusion. Heart normal in size & mediastinum normal in contour.  CARDIAC TTE (07/12/13): LV normal in size with moderate basal septal hypertrophy. EF 65-70%. Grade 2 diastolic dysfunction. LA & RA mildly dilated. RV mildly dilated with preserved systolic function. Pulmonary artery normal in size. RVSP 33 mmHg. No aortic stenosis or regurgitation. Aortic root nondilated. Mild mitral regurgitation without stenosis. Trivial pulmonic regurgitation. Mild tricuspid regurgitation. No pericardial effusion.  LABS  11/22/15 CBC: 6.2/15.1/44.6/172    Assessment & Plan:  71 y.o. male with right middle lobe nodules which seem to be improving. Patient also has a history of cough with possible asthma as well as underlying reflux with a hiatal hernia. I reviewed his chest CT scan with him today  which shows improving nodularity within his right middle lobe suggesting an inflammatory process. He has no symptoms that would suggest asthma and I do suspect his previous reactive airways disease etiology stems from his underlying reflux and hiatal hernia. I instructed the patient to notify me if he had any new breathing problems or questions before his next appointment.  1. Right middle lobe nodules: Resolving. Holding off on further imaging. 2. Cough/possible asthma: Suspect secondary to reflux. No new medications at this time. 3. GERD with hiatal hernia: Well-controlled with Protonix. No new medications. 4. Health maintenance: Status post influenza vaccine September 2017. 5. Follow-up: Return to clinic in 1 year or sooner if needed.  Sonia Baller Ashok Cordia, M.D. [facility] Pager:  [phone] After 3pm or if no response, call [phone] 11:07 AM 03/31/17

## 2017-03-31 NOTE — Patient Instructions (Signed)
   Call me if you feel your cough is getting worse or you have any new breathing problems before your next appointment.  I will see you back in 1 year or sooner if needed.

## 2017-04-05 ENCOUNTER — Ambulatory Visit (INDEPENDENT_AMBULATORY_CARE_PROVIDER_SITE_OTHER): Payer: Medicare Other | Admitting: Internal Medicine

## 2017-04-05 ENCOUNTER — Encounter: Payer: Self-pay | Admitting: Internal Medicine

## 2017-04-05 VITALS — BP 104/80 | HR 58 | Ht 70.0 in | Wt 179.0 lb

## 2017-04-05 DIAGNOSIS — I422 Other hypertrophic cardiomyopathy: Secondary | ICD-10-CM

## 2017-04-05 DIAGNOSIS — I472 Ventricular tachycardia, unspecified: Secondary | ICD-10-CM

## 2017-04-05 DIAGNOSIS — I4819 Other persistent atrial fibrillation: Secondary | ICD-10-CM

## 2017-04-05 DIAGNOSIS — I481 Persistent atrial fibrillation: Secondary | ICD-10-CM | POA: Diagnosis not present

## 2017-04-05 NOTE — Patient Instructions (Addendum)
Medication Instructions:  Your physician recommends that you continue on your current medications as directed. Please refer to the Current Medication list given to you today.   Labwork: None ordered   Testing/Procedures: None ordered   Follow-Up: Your physician wants you to follow-up in :3 months with Roderic Palau, NP at the [facility] and in 6 months with Dr. Rayann Heman.  You will receive a reminder letter in the mail two months in advance. If you don't receive a letter, please call our office to schedule the follow-up appointment.  Remote monitoring is used to monitor your ICD from home. This monitoring reduces the number of office visits required to check your device to one time per year. It allows [place] to keep an eye on the functioning of your device to ensure it is working properly. You are scheduled for a device check from home on 06/09/17. You may send your transmission at any time that day. If you have a wireless device, the transmission will be sent automatically. After your physician reviews your transmission, you will receive a postcard with your next transmission date.     Any Other Special Instructions Will Be Listed Below (If Applicable).     If you need a refill on your cardiac medications before your next appointment, please call your pharmacy.

## 2017-04-05 NOTE — Progress Notes (Signed)
PCP: Marton Redwood, MD Primary Cardiologist:  Dr Job Founds is a 71 y.o. male who presents today for routine electrophysiology followup.  Since sotalol initiation, the patient reports doing very well.  He feels much better in sinus.  His energy is improved.  Edema is resolved.  Today, he denies symptoms of palpitations, chest pain, shortness of breath,  lower extremity edema, dizziness, presyncope, syncope, or ICD shocks.  The patient is otherwise without complaint today.   Past Medical History:  Diagnosis Date  . Acute cholecystitis 06/21/2011  . 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 Allergy    SEASONAL  . Cataract   . Complication of anesthesia    HALLUCINATIONS  . GERD (gastroesophageal reflux disease)   . Hearing loss   . Hiatal hernia   . Hypertrophic cardiomyopathy ([place])   . Mitral regurgitation    Moderate  . Paroxysmal atrial fibrillation ([place]) 04/2015   documented on ICD remote interrogation  . Septal defect    LV Septal hyptertrophy 33mm, normal EF, minimal LVOT gradiant.  . VT (ventricular tachycardia) ([place]) 08/18/2013   s/p St. Jude ICD   Past Surgical History:  Procedure Laterality Date  . APPENDECTOMY  1950  . CARDIOVERSION N/A 02/25/2017   Procedure: CARDIOVERSION;  Surgeon: Fay Records, MD;  Location: [place];  Service: Cardiovascular;  Laterality: N/A;  . CHOLECYSTECTOMY  06/24/2011  . COLONOSCOPY    . EP IMPLANTABLE DEVICE N/A 09/03/2016   SJM Fortify Assura Dr generator change by Dr Rayann Heman.  ICD implanted for secondary prevention of sudden death  . IMPLANTABLE CARDIOVERTER DEFIBRILLATOR IMPLANT  08/18/2013   St. Jude ICD, serial # [id number]  . IMPLANTABLE CARDIOVERTER DEFIBRILLATOR IMPLANT N/A 08/18/2013   Procedure: IMPLANTABLE CARDIOVERTER DEFIBRILLATOR IMPLANT;  Surgeon: Coralyn Mark, MD;  Location: [facility] CATH LAB;  Service: Cardiovascular;  Laterality: N/A;    ROS- all systems are reviewed and negative except as  per HPI above  Current Outpatient Prescriptions  Medication Sig Dispense Refill  . acidophilus (RISAQUAD) CAPS capsule Take 1 capsule by mouth daily.    Marland Kitchen apixaban (ELIQUIS) 5 MG TABS tablet Take 1 tablet (5 mg total) by mouth 2 (two) times daily. 60 tablet 6  . Coenzyme Q10 (CO Q 10) 100 MG CAPS Take 100 mg by mouth daily.     . folic acid (FOLVITE) 1 MG tablet Take 1 mg by mouth daily.     Marland Kitchen glucosamine-chondroitin 500-400 MG tablet Take 1 tablet by mouth daily.    . pantoprazole (PROTONIX) 40 MG tablet Take 40 mg by mouth daily.    . sotalol (BETAPACE) 120 MG tablet Take 1 tablet (120 mg total) by mouth 2 (two) times daily. 60 tablet 3  . Zinc 50 MG CAPS Take 50 mg by mouth daily.      No current facility-administered medications for this visit.     Physical Exam: Vitals:   04/05/17 1637  BP: 104/80  Pulse: (!) 58  SpO2: 96%  Weight: 179 lb (81.2 kg)  Height: 5\' 10"  (1.778 m)    GEN- The patient is well appearing, alert and oriented x 3 today.   Head- normocephalic, atraumatic Eyes-  Sclera clear, conjunctiva pink Ears- hearing intact Oropharynx- clear Lungs- Clear to ausculation bilaterally, normal work of breathing Chest- ICD pocket is well healed Heart- Regular rate and rhythm, no murmurs, rubs or gallops, PMI not laterally displaced GI- soft, NT, ND, + BS Extremities- no clubbing, cyanosis, or edema  ICD interrogation- reviewed  in detail today,  See PACEART report  ekg today reveals sinus rhythm 58 bpm, PR 214 msec, nonspecific ST/T changes  Assessment and Plan:  1.  Persistent afib Maintaining sinus with sotalol Qt is stable Bmet, mg upon follow-up in AF clinic in 3 months   2. Moderate MR Stable No change required today Followed by Dr Marlou Porch  3. HCM Stable No change required today Normal ICD function See Pace Art report No changes today  4. VT No episodes  Follow-up with Butch Penny in the AF clinic in 3 months I will see again in 6  months [place] Follow-up with Dr Marlou Porch as scheduled  [name], [facility] 04/05/2017 5:06 PM

## 2017-04-09 LAB — CUP PACEART INCLINIC DEVICE CHECK
Brady Statistic RA Percent Paced: 47 %
Brady Statistic RV Percent Paced: 0.73 %
HighPow Impedance: 52.5285
Implantable Lead Implant Date: 20141205
Implantable Lead Model: 7121
Lead Channel Impedance Value: 487.5 Ohm
Lead Channel Pacing Threshold Pulse Width: 0.5 ms
Lead Channel Sensing Intrinsic Amplitude: 5 mV
Lead Channel Setting Pacing Amplitude: 2 V
Lead Channel Setting Pacing Amplitude: 2.5 V
Lead Channel Setting Pacing Pulse Width: 0.5 ms
MDC IDC LEAD IMPLANT DT: 20141205
MDC IDC LEAD LOCATION: 753859
MDC IDC LEAD LOCATION: 753860
MDC IDC MSMT BATTERY REMAINING LONGEVITY: 97 mo
MDC IDC MSMT LEADCHNL RA PACING THRESHOLD AMPLITUDE: 1 V
MDC IDC MSMT LEADCHNL RA PACING THRESHOLD PULSEWIDTH: 0.5 ms
MDC IDC MSMT LEADCHNL RV IMPEDANCE VALUE: 475 Ohm
MDC IDC MSMT LEADCHNL RV PACING THRESHOLD AMPLITUDE: 0.5 V
MDC IDC MSMT LEADCHNL RV SENSING INTR AMPL: 12 mV
MDC IDC PG IMPLANT DT: 20171221
MDC IDC PG SERIAL: 7387049
MDC IDC SESS DTM: 20180723203523
MDC IDC SET LEADCHNL RV SENSING SENSITIVITY: 0.5 mV

## 2017-04-10 IMAGING — CT CT CHEST W/O CM
2 of 3 series · 15 of 36 positions shown, 18 images · non-contrast
Comparison: 03/20/2016

CLINICAL DATA: Follow-up lung nodule

EXAM:
CT CHEST WITHOUT CONTRAST
TECHNIQUE: Multidetector CT imaging of the chest was performed following the
standard protocol without IV contrast.

[Series 2: thorax · axial · 0.74mm/px · z∈[-306,-30]mm · 12 of 163 slices shown, 15 images]
[im 13/163  mediastinal]
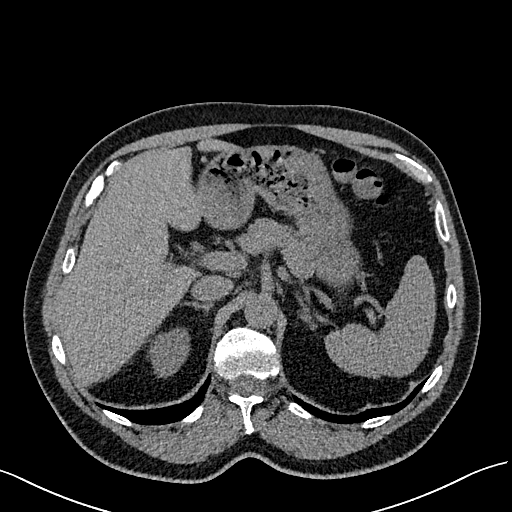
[im 13/163  lung]
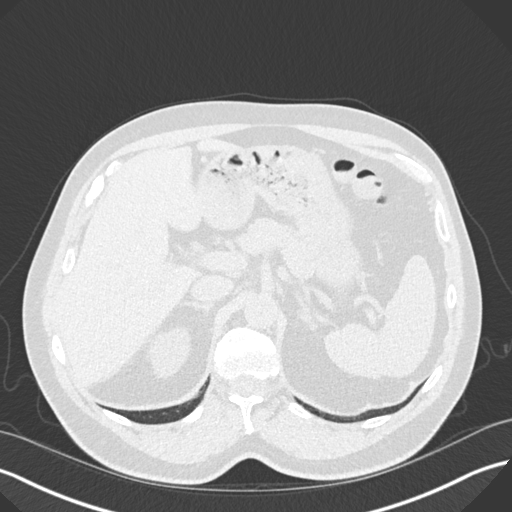
[im 25/163  lung]
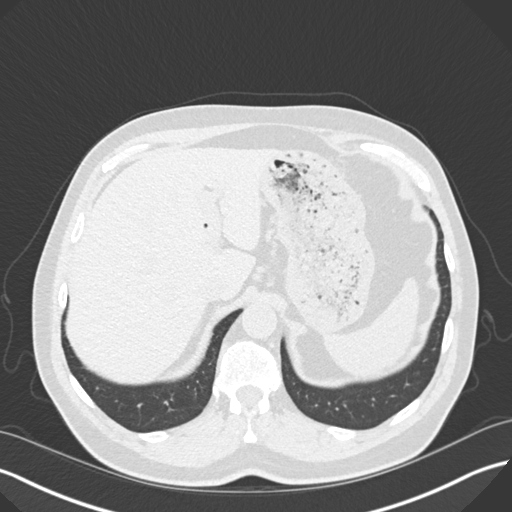
[im 37/163  lung]
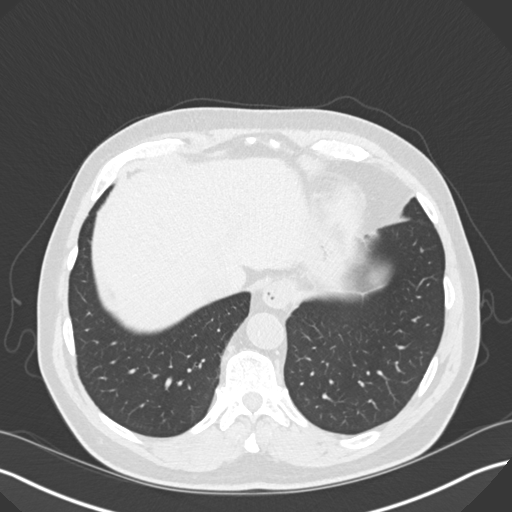
[im 49/163  lung]
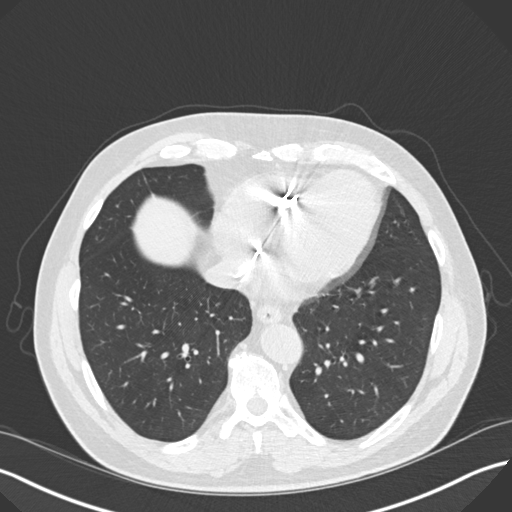
[im 61/163  mediastinal]
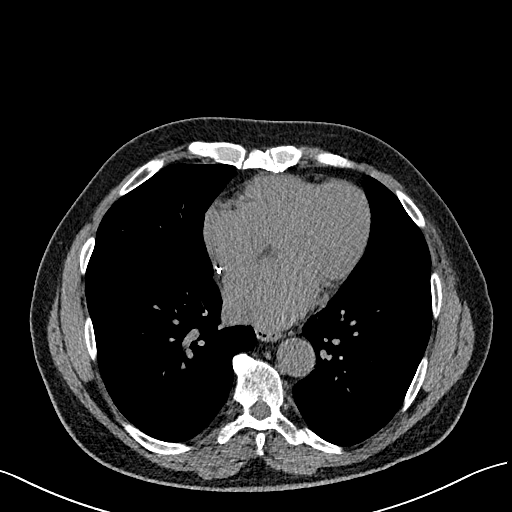
[im 61/163  lung]
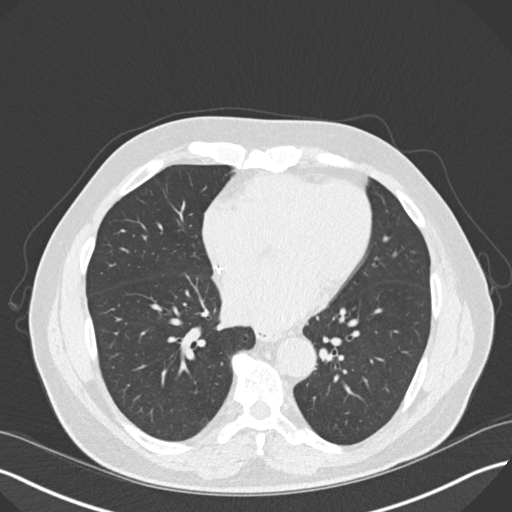
[im 73/163  lung]
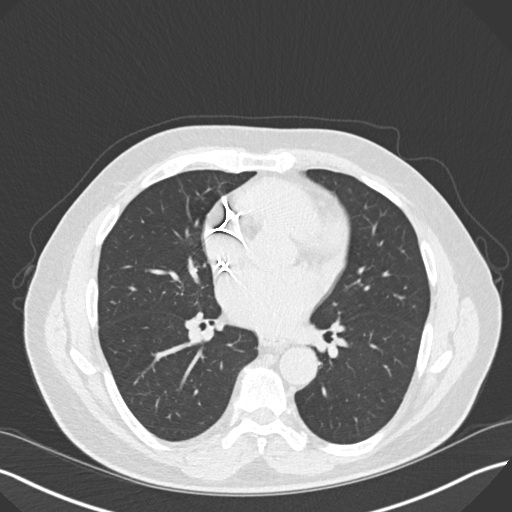
[im 91/163  lung]
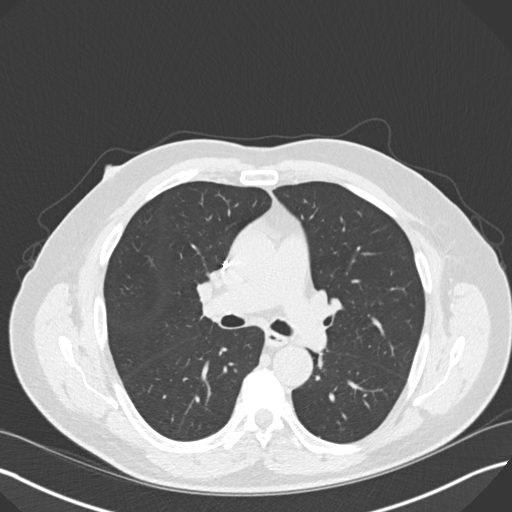
[im 103/163  lung]
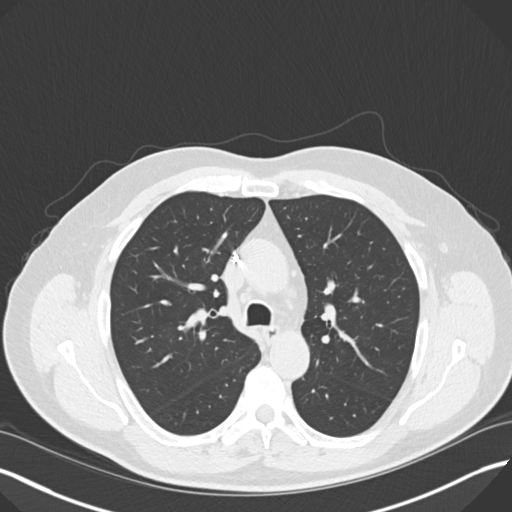
[im 115/163  mediastinal]
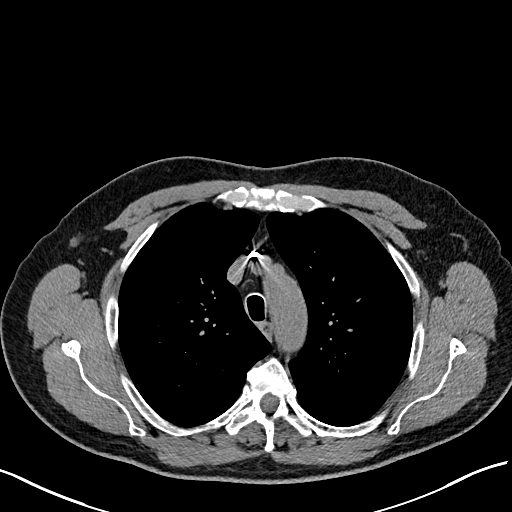
[im 115/163  lung]
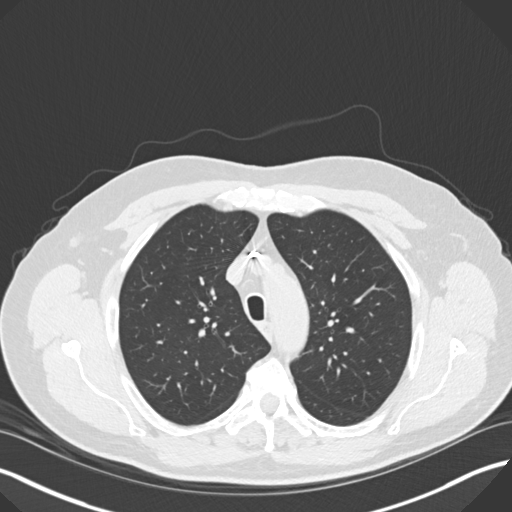
[im 127/163  lung]
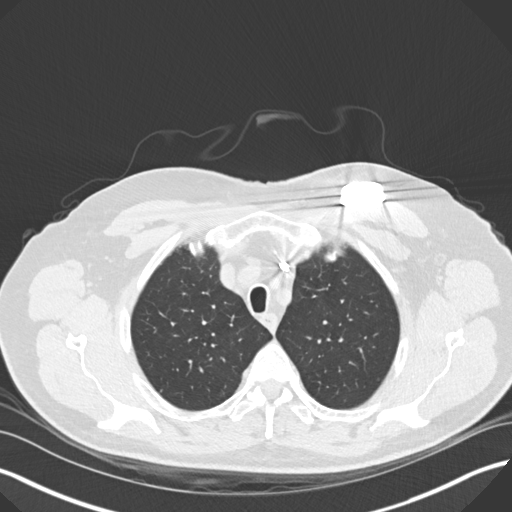
[im 139/163  lung]
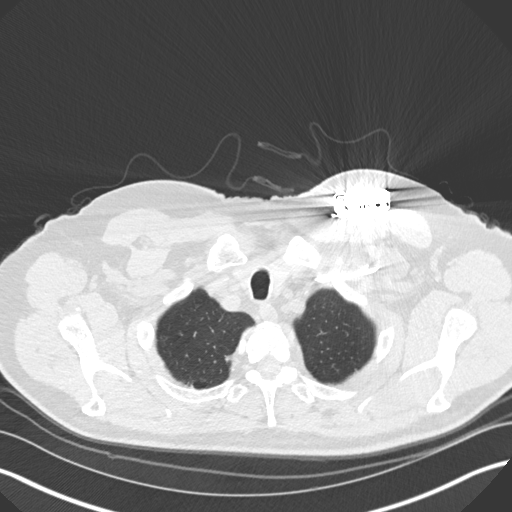
[im 151/163  lung]
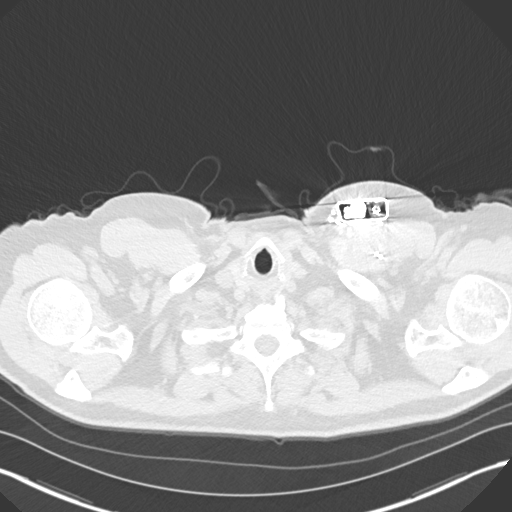

[Series 5: coronal · coronal · 0.64mm/px · 3 of 131 slices shown]
[im 27/131  lung]
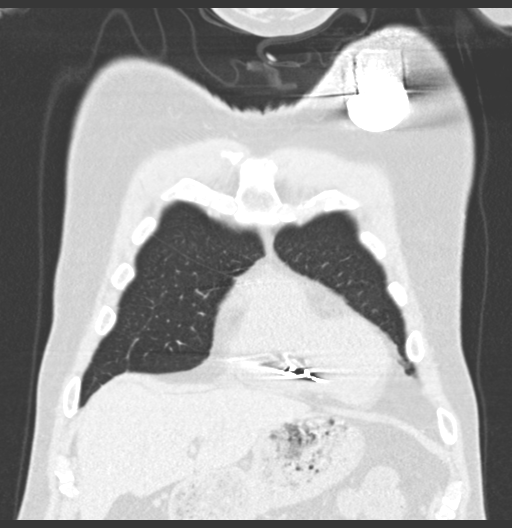
[im 53/131  lung]
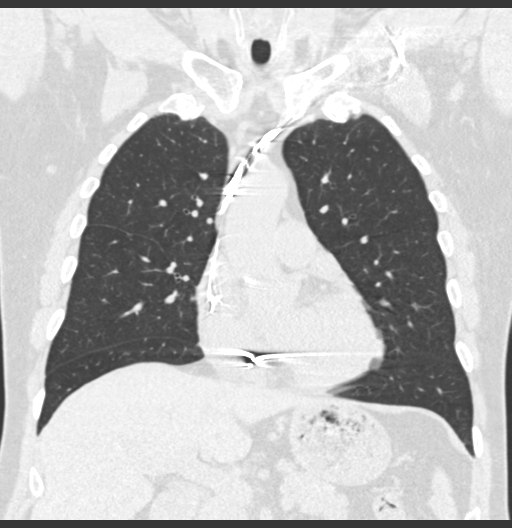
[im 79/131  lung]
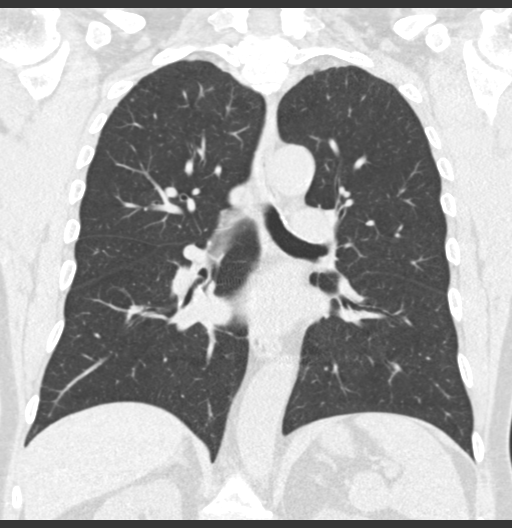

[15 of 36 positions shown; findings below may reference images not displayed]

FINDINGS: Cardiovascular: Cardiac size is stable. Two lead cardiac pacemaker
is unchanged in position. No aortic aneurysm. No pericardial
effusion.

Mediastinum/Nodes: No mediastinal hematoma or adenopathy. Small
hiatal hernia.

Lungs/Pleura: Images of the lung parenchyma shows no segmental
infiltrate or pulmonary edema. No pneumothorax. Axial image 98 again
noted small clusters lung nodules in lateral aspect of right middle
lobe the largest measures 5 mm. No new pulmonary nodules are noted.

Upper Abdomen: Visualized upper abdomen shows no adrenal gland mass.
Status postcholecystectomy. Visualized upper kidneys unenhanced
spleen and pancreas is unremarkable.

Musculoskeletal: No destructive bony lesions are noted. Sagittal
images of the spine shows mild degenerative changes thoracic spine.
IMPRESSION: 1. Stable small clustered lung nodules in lateral aspect right
middle lobe the largest measures 5 mm. No follow-up needed if
patient is low-risk (and has no known or suspected primary
neoplasm). Non-contrast chest CT can be considered in 12 months if
patient is high-risk. This recommendation follows the consensus
statement: Guidelines for Management of Incidental Pulmonary Nodules
Detected on CT Images: From the [HOSPITAL] 9426; Radiology
9426; [DATE].
2. No new pulmonary nodules.  No infiltrate or pulmonary edema.
3. Status postcholecystectomy.
4. No mediastinal hematoma or adenopathy.  Small hiatal hernia.

## 2017-06-09 ENCOUNTER — Ambulatory Visit (INDEPENDENT_AMBULATORY_CARE_PROVIDER_SITE_OTHER): Payer: Medicare Other | Admitting: *Deleted

## 2017-06-09 DIAGNOSIS — I472 Ventricular tachycardia, unspecified: Secondary | ICD-10-CM

## 2017-06-09 NOTE — Progress Notes (Signed)
Remote ICD transmission.   

## 2017-06-10 ENCOUNTER — Encounter: Payer: Self-pay | Admitting: Cardiology

## 2017-06-11 DIAGNOSIS — Z0000 Encounter for general adult medical examination without abnormal findings: Secondary | ICD-10-CM | POA: Diagnosis not present

## 2017-06-11 DIAGNOSIS — Z125 Encounter for screening for malignant neoplasm of prostate: Secondary | ICD-10-CM | POA: Diagnosis not present

## 2017-06-11 DIAGNOSIS — R7301 Impaired fasting glucose: Secondary | ICD-10-CM | POA: Diagnosis not present

## 2017-06-11 DIAGNOSIS — E781 Pure hyperglyceridemia: Secondary | ICD-10-CM | POA: Diagnosis not present

## 2017-06-11 LAB — CUP PACEART REMOTE DEVICE CHECK
Battery Remaining Longevity: 91 mo
Battery Remaining Percentage: 90 %
Brady Statistic AP VP Percent: 1 %
Brady Statistic AP VS Percent: 41 %
Brady Statistic AS VP Percent: 1 %
Brady Statistic AS VS Percent: 57 %
Brady Statistic RA Percent Paced: 41 %
Brady Statistic RV Percent Paced: 1 %
HIGH POWER IMPEDANCE MEASURED VALUE: 48 Ohm
HighPow Impedance: 47 Ohm
Implantable Lead Implant Date: 20141205
Implantable Lead Location: 753859
Implantable Lead Model: 7121
Lead Channel Impedance Value: 480 Ohm
Lead Channel Pacing Threshold Amplitude: 0.5 V
Lead Channel Pacing Threshold Amplitude: 1 V
Lead Channel Sensing Intrinsic Amplitude: 12 mV
Lead Channel Setting Pacing Amplitude: 2 V
Lead Channel Setting Pacing Amplitude: 2.5 V
Lead Channel Setting Pacing Pulse Width: 0.5 ms
Lead Channel Setting Sensing Sensitivity: 0.5 mV
MDC IDC LEAD IMPLANT DT: 20141205
MDC IDC LEAD LOCATION: 753860
MDC IDC MSMT BATTERY VOLTAGE: 3.13 V
MDC IDC MSMT LEADCHNL RA IMPEDANCE VALUE: 440 Ohm
MDC IDC MSMT LEADCHNL RA PACING THRESHOLD PULSEWIDTH: 0.5 ms
MDC IDC MSMT LEADCHNL RA SENSING INTR AMPL: 4.1 mV
MDC IDC MSMT LEADCHNL RV PACING THRESHOLD PULSEWIDTH: 0.5 ms
MDC IDC PG IMPLANT DT: 20171221
MDC IDC PG SERIAL: 7387049
MDC IDC SESS DTM: 20180926060017

## 2017-06-18 DIAGNOSIS — E781 Pure hyperglyceridemia: Secondary | ICD-10-CM | POA: Diagnosis not present

## 2017-06-18 DIAGNOSIS — Z6825 Body mass index (BMI) 25.0-25.9, adult: Secondary | ICD-10-CM | POA: Diagnosis not present

## 2017-06-18 DIAGNOSIS — H43393 Other vitreous opacities, bilateral: Secondary | ICD-10-CM | POA: Diagnosis not present

## 2017-06-18 DIAGNOSIS — J984 Other disorders of lung: Secondary | ICD-10-CM | POA: Diagnosis not present

## 2017-06-18 DIAGNOSIS — Z1389 Encounter for screening for other disorder: Secondary | ICD-10-CM | POA: Diagnosis not present

## 2017-06-18 DIAGNOSIS — I421 Obstructive hypertrophic cardiomyopathy: Secondary | ICD-10-CM | POA: Diagnosis not present

## 2017-06-18 DIAGNOSIS — Z95810 Presence of automatic (implantable) cardiac defibrillator: Secondary | ICD-10-CM | POA: Diagnosis not present

## 2017-06-18 DIAGNOSIS — R7301 Impaired fasting glucose: Secondary | ICD-10-CM | POA: Diagnosis not present

## 2017-06-18 DIAGNOSIS — Z23 Encounter for immunization: Secondary | ICD-10-CM | POA: Diagnosis not present

## 2017-06-18 DIAGNOSIS — Z0000 Encounter for general adult medical examination without abnormal findings: Secondary | ICD-10-CM | POA: Diagnosis not present

## 2017-06-18 DIAGNOSIS — H532 Diplopia: Secondary | ICD-10-CM | POA: Diagnosis not present

## 2017-06-18 DIAGNOSIS — M3500 Sicca syndrome, unspecified: Secondary | ICD-10-CM | POA: Diagnosis not present

## 2017-06-18 DIAGNOSIS — I480 Paroxysmal atrial fibrillation: Secondary | ICD-10-CM | POA: Diagnosis not present

## 2017-06-18 DIAGNOSIS — H5005 Alternating esotropia: Secondary | ICD-10-CM | POA: Diagnosis not present

## 2017-06-18 DIAGNOSIS — Z7901 Long term (current) use of anticoagulants: Secondary | ICD-10-CM | POA: Diagnosis not present

## 2017-06-26 ENCOUNTER — Other Ambulatory Visit: Payer: Self-pay | Admitting: Physician Assistant

## 2017-06-26 ENCOUNTER — Other Ambulatory Visit: Payer: Self-pay | Admitting: Nurse Practitioner

## 2017-07-02 DIAGNOSIS — Z1212 Encounter for screening for malignant neoplasm of rectum: Secondary | ICD-10-CM | POA: Diagnosis not present

## 2017-07-07 ENCOUNTER — Ambulatory Visit (HOSPITAL_COMMUNITY)
Admission: RE | Admit: 2017-07-07 | Discharge: 2017-07-07 | Disposition: A | Discharge status: Home / Self Care | Payer: Medicare Other | Source: Ambulatory Visit | Attending: Nurse Practitioner | Admitting: Nurse Practitioner

## 2017-07-07 ENCOUNTER — Encounter (HOSPITAL_COMMUNITY): Payer: Self-pay | Admitting: Nurse Practitioner

## 2017-07-07 VITALS — BP 114/72 | HR 54 | Ht 70.0 in | Wt 175.4 lb

## 2017-07-07 DIAGNOSIS — Z8249 Family history of ischemic heart disease and other diseases of the circulatory system: Secondary | ICD-10-CM | POA: Diagnosis not present

## 2017-07-07 DIAGNOSIS — Z87891 Personal history of nicotine dependence: Secondary | ICD-10-CM | POA: Diagnosis not present

## 2017-07-07 DIAGNOSIS — I480 Paroxysmal atrial fibrillation: Secondary | ICD-10-CM

## 2017-07-07 DIAGNOSIS — Z8371 Family history of colonic polyps: Secondary | ICD-10-CM | POA: Insufficient documentation

## 2017-07-07 DIAGNOSIS — Z9889 Other specified postprocedural states: Secondary | ICD-10-CM | POA: Insufficient documentation

## 2017-07-07 DIAGNOSIS — Z833 Family history of diabetes mellitus: Secondary | ICD-10-CM | POA: Diagnosis not present

## 2017-07-07 DIAGNOSIS — K219 Gastro-esophageal reflux disease without esophagitis: Secondary | ICD-10-CM | POA: Insufficient documentation

## 2017-07-07 DIAGNOSIS — H9190 Unspecified hearing loss, unspecified ear: Secondary | ICD-10-CM | POA: Insufficient documentation

## 2017-07-07 DIAGNOSIS — I472 Ventricular tachycardia: Secondary | ICD-10-CM | POA: Diagnosis not present

## 2017-07-07 DIAGNOSIS — I422 Other hypertrophic cardiomyopathy: Secondary | ICD-10-CM | POA: Diagnosis not present

## 2017-07-07 DIAGNOSIS — H269 Unspecified cataract: Secondary | ICD-10-CM | POA: Diagnosis not present

## 2017-07-07 DIAGNOSIS — I340 Nonrheumatic mitral (valve) insufficiency: Secondary | ICD-10-CM | POA: Insufficient documentation

## 2017-07-07 DIAGNOSIS — Z7902 Long term (current) use of antithrombotics/antiplatelets: Secondary | ICD-10-CM | POA: Insufficient documentation

## 2017-07-07 DIAGNOSIS — Z888 Allergy status to other drugs, medicaments and biological substances status: Secondary | ICD-10-CM | POA: Insufficient documentation

## 2017-07-07 DIAGNOSIS — Z95810 Presence of automatic (implantable) cardiac defibrillator: Secondary | ICD-10-CM | POA: Insufficient documentation

## 2017-07-07 DIAGNOSIS — Z9049 Acquired absence of other specified parts of digestive tract: Secondary | ICD-10-CM | POA: Diagnosis not present

## 2017-07-07 LAB — MAGNESIUM: Magnesium: 2 mg/dL (ref 1.7–2.4)

## 2017-07-07 LAB — BASIC METABOLIC PANEL
ANION GAP: 5 (ref 5–15)
BUN: 21 mg/dL — AB (ref 6–20)
CALCIUM: 9.4 mg/dL (ref 8.9–10.3)
CO2: 29 mmol/L (ref 22–32)
CREATININE: 1.15 mg/dL (ref 0.61–1.24)
Chloride: 104 mmol/L (ref 101–111)
GFR calc Af Amer: >60 mL/min (ref >60)
Glucose, Bld: 97 mg/dL (ref 65–99)
Potassium: 5.3 mmol/L — ABNORMAL HIGH (ref 3.5–5.1)
Sodium: 138 mmol/L (ref 135–145)

## 2017-07-07 NOTE — Progress Notes (Signed)
Primary Care Physician: Marton Redwood, MD Referring [place] Triage EP: Dr. Everlean Patterson Joseph Morrow is a 71 y.o. male with a h/o hypertrophic cardiomyopathy, s/p ICD for VT, remote paroxysmal afib and most recent increase of afib burden to 18%, March 2018, but in persistent afib since a week ago  Saturday.He has noted increase in HR with activity and some fatigue but other wise, heart rates are mostly controlled on current dose of atenolol. He is very active and does not like to see his heart rate go up with activity. He would very much like to restore SR. He just started on eliquis 5 mg bid, first full day Saturday May 19, for chadsvasc score of 2(age, LV dysfunction). Last echo was 2014.Had generator change out in January.  F/u was hospitalized for sotalol administration 6/12 to 6/14. He returns today, 6/21 in SR and feels improved. Qtc stable. He has noted LLE since being home on sotalol with walking.   Today, he denies symptoms of   chest pain, shortness of breath, orthopnea, PND, lower extremity edema, dizziness, presyncope, syncope, or neurologic sequela.  Positive for palpitations and fatigue.The patient is tolerating medications without difficulties and is otherwise without complaint today.   Past Medical History:  Diagnosis Date  . Acute cholecystitis 06/21/2011  . 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 Allergy    SEASONAL  . Cataract   . Complication of anesthesia    HALLUCINATIONS  . GERD (gastroesophageal reflux disease)   . Hearing loss   . Hiatal hernia   . Hypertrophic cardiomyopathy ([place])   . Mitral regurgitation    Moderate  . Paroxysmal atrial fibrillation ([place]) 04/2015   documented on ICD remote interrogation  . Septal defect    LV Septal hyptertrophy 7mm, normal EF, minimal LVOT gradiant.  . VT (ventricular tachycardia) ([place]) 08/18/2013   s/p St. Jude ICD   Past Surgical History:  Procedure Laterality Date  .  APPENDECTOMY  1950  . CARDIOVERSION N/A 02/25/2017   Procedure: CARDIOVERSION;  Surgeon: Fay Records, MD;  Location: [facility];  Service: Cardiovascular;  Laterality: N/A;  . CHOLECYSTECTOMY  06/24/2011  . COLONOSCOPY    . EP IMPLANTABLE DEVICE N/A 09/03/2016   SJM Fortify Assura Dr generator change by Dr Rayann Heman.  ICD implanted for secondary prevention of sudden death  . IMPLANTABLE CARDIOVERTER DEFIBRILLATOR IMPLANT  08/18/2013   St. Jude ICD, serial # [id number]  . IMPLANTABLE CARDIOVERTER DEFIBRILLATOR IMPLANT N/A 08/18/2013   Procedure: IMPLANTABLE CARDIOVERTER DEFIBRILLATOR IMPLANT;  Surgeon: Coralyn Mark, MD;  Location: [facility] CATH [facility];  Service: Cardiovascular;  Laterality: N/A;    Current Outpatient Prescriptions  Medication Sig Dispense Refill  . acidophilus (RISAQUAD) CAPS capsule Take 1 capsule by mouth daily.    . Coenzyme Q10 (CO Q 10) 100 MG CAPS Take 100 mg by mouth daily.     Marland Kitchen ELIQUIS 5 MG TABS tablet TAKE 1 TABLET BY MOUTH TWICE DAILY. 878 tablet 3  . folic acid (FOLVITE) 1 MG tablet Take 1 mg by mouth daily.     Marland Kitchen glucosamine-chondroitin 500-400 MG tablet Take 1 tablet by mouth daily.    . pantoprazole (PROTONIX) 40 MG tablet Take 40 mg by mouth daily.    . sotalol (BETAPACE) 120 MG tablet TAKE 1 TABLET(120 MG) BY MOUTH TWICE DAILY 180 tablet 2  . Zinc 50 MG CAPS Take 50 mg by mouth daily.      No current facility-administered medications for this encounter.  Allergies  Allergen Reactions  . Dextromethorphan Other (See Comments)    nervous  . Versed [Midazolam] Other (See Comments)    "hallucinations, headache"  . Antihistamines, Diphenhydramine-Type Anxiety    Social History   Social History  . Marital status: Married    Spouse name: N/A  . Number of children: 2  . Years of education: N/A   Occupational History  . retired    Social History Main Topics  . Smoking status: Former Smoker    Packs/day: 2.00    Years: 20.00    Types: Cigarettes     Quit date: 07/06/1981  . Smokeless tobacco: Never Used  . Alcohol use 0.0 oz/week     Comment: 2 glasses per day  . Drug use: No  . Sexual activity: Not on file   Other Topics Concern  . Not on file   Social History Narrative   Originally from [place]. Moved to [place] in 2012. Currently has no pets. No bird or hot tub exposure. 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 He did have exposure to Beneze, Acetone, Styrene, & Hydrocarbon early in his career.    Family History  Problem Relation Age of Onset  . Hypertrophic cardiomyopathy Mother   . Arrhythmia Mother   . Heart failure Mother   . Pneumonia Father   . Diabetes Father   . Colon polyps Father   . Heart attack Brother   . Hypertrophic cardiomyopathy Brother   . Heart Problems Brother        VAD  . Arrhythmia Brother   . Diabetes Brother   . Sudden death Brother   . Lung disease Neg Hx     ROS- All systems are reviewed and negative except as per the HPI above  Physical Exam: Vitals:   07/07/17 1021  BP: 114/72  Pulse: (!) 54  Weight: 175 lb 6.4 oz (79.6 kg)  Height: 5\' 10"  (1.778 m)   Wt Readings from Last 3 Encounters:  07/07/17 175 lb 6.4 oz (79.6 kg)  04/05/17 179 lb (81.2 kg)  03/31/17 171 lb 3.2 oz (77.7 kg)    Labs: Lab Results  Component Value Date   NA 138 03/10/2017   K 4.3 03/10/2017   CL 104 03/10/2017   CO2 27 03/10/2017   GLUCOSE 116 (H) 03/10/2017   BUN 18 03/10/2017   CREATININE 1.25 (H) 03/10/2017   CALCIUM 9.4 03/10/2017   MG 2.1 03/10/2017   No results found for: INR No results found for: CHOL, HDL, LDLCALC, TRIG   GEN- The patient is well appearing, alert and oriented x 3 today.   Head- normocephalic, atraumatic Eyes-  Sclera clear, conjunctiva pink Ears- hearing intact Oropharynx- clear Neck- supple, no JVP Lymph- no cervical lymphadenopathy Lungs- Clear to ausculation bilaterally, normal work of breathing Heart-regular rate and rhythm, no  murmurs, rubs or gallops, PMI not laterally displaced GI- soft, NT, ND, + BS Extremities- no clubbing, cyanosis, or edema MS- no significant deformity or atrophy Skin- no rash or lesion Psych- euthymic mood, full affect Neuro- strength and sensation are intact  EKG-atrial paced rhythm  pr int 270 ms, qrs int 80 ms, qtc 438 ms . Echo- 2018-------------------------------------------------------------------- Study Conclusions  - Left ventricle: The cavity size was normal. There was moderate   asymmetric hypertrophy of the septum. The appearance was   consistent with hypertrophic cardiomyopathy. Systolic function   was vigorous. The estimated ejection fraction was in the range of   65%  to 70%. There was no dynamic obstruction. Wall motion was   normal; there were no regional wall motion abnormalities. - Mitral valve: There was mild to moderate regurgitation directed   centrally. - Left atrium: The atrium was severely dilated. - Right ventricle: The cavity size was mildly dilated. - Right atrium: The atrium was mildly dilated. - Tricuspid valve: There was moderate regurgitation. - Pulmonary arteries: Systolic pressure was mildly increased. PA   peak pressure: 41 mm Hg (S).   Assessment and Plan: 1. Paroxysmal afib Increase in burden since March of this year, but with  Sotalol initiation restoring SR Continue Sotalol 120 mg bid Continue eliquis 5 mg bid  bmet/mag today  2. HCM Stable  Pr Dr. Rayann Heman  3. PPM Per Dr. Nathen May clinc  F/u with Dr. Rayann Heman per recall  Geroge Baseman. Mila [facility] [address] [place], [place] [address] [place] Rickayla Wieland, [facility] [address] [place], [place] [address] [phone]

## 2017-07-13 DIAGNOSIS — H6123 Impacted cerumen, bilateral: Secondary | ICD-10-CM | POA: Diagnosis not present

## 2017-07-13 DIAGNOSIS — Z6825 Body mass index (BMI) 25.0-25.9, adult: Secondary | ICD-10-CM | POA: Diagnosis not present

## 2017-07-14 ENCOUNTER — Ambulatory Visit (HOSPITAL_COMMUNITY)
Admission: RE | Admit: 2017-07-14 | Discharge: 2017-07-14 | Disposition: A | Discharge status: Home / Self Care | Payer: Medicare Other | Source: Ambulatory Visit | Attending: Nurse Practitioner | Admitting: Nurse Practitioner

## 2017-07-14 DIAGNOSIS — I481 Persistent atrial fibrillation: Secondary | ICD-10-CM | POA: Insufficient documentation

## 2017-07-14 LAB — BASIC METABOLIC PANEL
Anion gap: 5 (ref 5–15)
BUN: 19 mg/dL (ref 6–20)
CALCIUM: 8.9 mg/dL (ref 8.9–10.3)
CHLORIDE: 105 mmol/L (ref 101–111)
CO2: 28 mmol/L (ref 22–32)
CREATININE: 1.15 mg/dL (ref 0.61–1.24)
Glucose, Bld: 117 mg/dL — ABNORMAL HIGH (ref 65–99)
Potassium: 4.1 mmol/L (ref 3.5–5.1)
SODIUM: 138 mmol/L (ref 135–145)

## 2017-08-04 DIAGNOSIS — M25511 Pain in right shoulder: Secondary | ICD-10-CM | POA: Diagnosis not present

## 2017-08-18 DIAGNOSIS — M67911 Unspecified disorder of synovium and tendon, right shoulder: Secondary | ICD-10-CM | POA: Diagnosis not present

## 2017-08-28 ENCOUNTER — Other Ambulatory Visit: Payer: Self-pay | Admitting: Nurse Practitioner

## 2017-09-01 DIAGNOSIS — M25511 Pain in right shoulder: Secondary | ICD-10-CM | POA: Diagnosis not present

## 2017-09-01 DIAGNOSIS — M7541 Impingement syndrome of right shoulder: Secondary | ICD-10-CM | POA: Diagnosis not present

## 2017-09-08 DIAGNOSIS — M7541 Impingement syndrome of right shoulder: Secondary | ICD-10-CM | POA: Diagnosis not present

## 2017-09-08 DIAGNOSIS — M25511 Pain in right shoulder: Secondary | ICD-10-CM | POA: Diagnosis not present

## 2017-09-09 ENCOUNTER — Ambulatory Visit (INDEPENDENT_AMBULATORY_CARE_PROVIDER_SITE_OTHER): Payer: Medicare Other | Admitting: *Deleted

## 2017-09-09 DIAGNOSIS — I422 Other hypertrophic cardiomyopathy: Secondary | ICD-10-CM

## 2017-09-09 NOTE — Progress Notes (Signed)
Remote ICD transmission.   

## 2017-09-10 ENCOUNTER — Encounter: Payer: Self-pay | Admitting: Cardiology

## 2017-09-15 DIAGNOSIS — M25511 Pain in right shoulder: Secondary | ICD-10-CM | POA: Diagnosis not present

## 2017-09-15 DIAGNOSIS — M7541 Impingement syndrome of right shoulder: Secondary | ICD-10-CM | POA: Diagnosis not present

## 2017-09-17 DIAGNOSIS — M25511 Pain in right shoulder: Secondary | ICD-10-CM | POA: Diagnosis not present

## 2017-09-17 DIAGNOSIS — M7541 Impingement syndrome of right shoulder: Secondary | ICD-10-CM | POA: Diagnosis not present

## 2017-09-21 DIAGNOSIS — M25511 Pain in right shoulder: Secondary | ICD-10-CM | POA: Diagnosis not present

## 2017-09-21 DIAGNOSIS — M7541 Impingement syndrome of right shoulder: Secondary | ICD-10-CM | POA: Diagnosis not present

## 2017-09-24 DIAGNOSIS — M25511 Pain in right shoulder: Secondary | ICD-10-CM | POA: Diagnosis not present

## 2017-09-24 DIAGNOSIS — M7541 Impingement syndrome of right shoulder: Secondary | ICD-10-CM | POA: Diagnosis not present

## 2017-09-27 LAB — CUP PACEART REMOTE DEVICE CHECK
Battery Remaining Percentage: 88 %
Battery Voltage: 3.1 V
Brady Statistic AP VP Percent: 1 %
Brady Statistic RA Percent Paced: 39 %
Brady Statistic RV Percent Paced: 1 %
HIGH POWER IMPEDANCE MEASURED VALUE: 44 Ohm
HighPow Impedance: 44 Ohm
Implantable Lead Implant Date: 20141205
Implantable Lead Model: 7121
Implantable Pulse Generator Implant Date: 20171221
Lead Channel Impedance Value: 530 Ohm
Lead Channel Pacing Threshold Amplitude: 0.5 V
Lead Channel Pacing Threshold Pulse Width: 0.5 ms
Lead Channel Sensing Intrinsic Amplitude: 12 mV
Lead Channel Setting Pacing Amplitude: 2 V
Lead Channel Setting Pacing Amplitude: 2.5 V
MDC IDC LEAD IMPLANT DT: 20141205
MDC IDC LEAD LOCATION: 753859
MDC IDC LEAD LOCATION: 753860
MDC IDC MSMT BATTERY REMAINING LONGEVITY: 88 mo
MDC IDC MSMT LEADCHNL RA IMPEDANCE VALUE: 430 Ohm
MDC IDC MSMT LEADCHNL RA PACING THRESHOLD AMPLITUDE: 1 V
MDC IDC MSMT LEADCHNL RA PACING THRESHOLD PULSEWIDTH: 0.5 ms
MDC IDC MSMT LEADCHNL RA SENSING INTR AMPL: 3.8 mV
MDC IDC PG SERIAL: 7387049
MDC IDC SESS DTM: 20181227070016
MDC IDC SET LEADCHNL RV PACING PULSEWIDTH: 0.5 ms
MDC IDC SET LEADCHNL RV SENSING SENSITIVITY: 0.5 mV
MDC IDC STAT BRADY AP VS PERCENT: 39 %
MDC IDC STAT BRADY AS VP PERCENT: 1 %
MDC IDC STAT BRADY AS VS PERCENT: 60 %

## 2017-09-28 DIAGNOSIS — M7541 Impingement syndrome of right shoulder: Secondary | ICD-10-CM | POA: Diagnosis not present

## 2017-09-28 DIAGNOSIS — M25511 Pain in right shoulder: Secondary | ICD-10-CM | POA: Diagnosis not present

## 2017-09-29 DIAGNOSIS — M67911 Unspecified disorder of synovium and tendon, right shoulder: Secondary | ICD-10-CM | POA: Diagnosis not present

## 2017-10-01 DIAGNOSIS — M25511 Pain in right shoulder: Secondary | ICD-10-CM | POA: Diagnosis not present

## 2017-10-01 DIAGNOSIS — M7541 Impingement syndrome of right shoulder: Secondary | ICD-10-CM | POA: Diagnosis not present

## 2017-10-06 ENCOUNTER — Encounter: Payer: Self-pay | Admitting: Internal Medicine

## 2017-10-06 ENCOUNTER — Ambulatory Visit (INDEPENDENT_AMBULATORY_CARE_PROVIDER_SITE_OTHER): Payer: Medicare Other | Admitting: Internal Medicine

## 2017-10-06 VITALS — BP 124/62 | HR 58 | Ht 70.0 in | Wt 178.0 lb

## 2017-10-06 DIAGNOSIS — I422 Other hypertrophic cardiomyopathy: Secondary | ICD-10-CM

## 2017-10-06 DIAGNOSIS — I481 Persistent atrial fibrillation: Secondary | ICD-10-CM | POA: Diagnosis not present

## 2017-10-06 DIAGNOSIS — I472 Ventricular tachycardia, unspecified: Secondary | ICD-10-CM

## 2017-10-06 DIAGNOSIS — Z95810 Presence of automatic (implantable) cardiac defibrillator: Secondary | ICD-10-CM | POA: Diagnosis not present

## 2017-10-06 DIAGNOSIS — I4819 Other persistent atrial fibrillation: Secondary | ICD-10-CM

## 2017-10-06 LAB — CUP PACEART INCLINIC DEVICE CHECK
Brady Statistic RV Percent Paced: 0.96 %
Date Time Interrogation Session: 20190123130047
HighPow Impedance: 52.3125
Implantable Lead Implant Date: 20141205
Implantable Lead Location: 753860
Implantable Lead Model: 7121
Lead Channel Impedance Value: 525 Ohm
Lead Channel Pacing Threshold Amplitude: 1 V
Lead Channel Pacing Threshold Pulse Width: 0.5 ms
Lead Channel Sensing Intrinsic Amplitude: 12 mV
Lead Channel Sensing Intrinsic Amplitude: 5 mV
Lead Channel Setting Pacing Pulse Width: 0.5 ms
MDC IDC LEAD IMPLANT DT: 20141205
MDC IDC LEAD LOCATION: 753859
MDC IDC MSMT BATTERY REMAINING LONGEVITY: 92 mo
MDC IDC MSMT LEADCHNL RA IMPEDANCE VALUE: 462.5 Ohm
MDC IDC MSMT LEADCHNL RV PACING THRESHOLD AMPLITUDE: 0.5 V
MDC IDC MSMT LEADCHNL RV PACING THRESHOLD PULSEWIDTH: 0.5 ms
MDC IDC PG IMPLANT DT: 20171221
MDC IDC SET LEADCHNL RA PACING AMPLITUDE: 2 V
MDC IDC SET LEADCHNL RV PACING AMPLITUDE: 2.5 V
MDC IDC SET LEADCHNL RV SENSING SENSITIVITY: 0.5 mV
MDC IDC STAT BRADY RA PERCENT PACED: 39 %
Pulse Gen Serial Number: 7387049

## 2017-10-06 NOTE — Patient Instructions (Addendum)
Medication Instructions:  Your physician recommends that you continue on your current medications as directed. Please refer to the Current Medication list given to you today.   Labwork: None ordered.  Testing/Procedures: None ordered.  Follow-Up: Your physician wants you to follow-up in: 6 months with Doristine Devoid, NP in the      [facility] clinic. You will receive a reminder letter in the mail two months in advance. If you don't receive a letter, please call our office to schedule the follow-up appointment.  Remote monitoring is used to monitor your ICD from home. This monitoring reduces the number of office visits required to check your device to one time per year. It allows [place] to keep an eye on the functioning of your device to ensure it is working properly. You are scheduled for a device check from home on 12/09/2017. You may send your transmission at any time that day. If you have a wireless device, the transmission will be sent automatically. After your physician reviews your transmission, you will receive a postcard with your next transmission date.     Any Other Special Instructions Will Be Listed Below (If Applicable).  Please follow up with:  Dr Marlou Porch in 3 months Amber Seilor in 12 months     If you need a refill on your cardiac medications before your next appointment, please call your pharmacy.

## 2017-10-06 NOTE — Progress Notes (Signed)
PCP: Marton Redwood, MD Primary Cardiologist:  Dr Marlou Porch Primary EP: Dr Salome Spotted is a 72 y.o. male who presents today for routine electrophysiology followup.  Since last being seen in our clinic, the patient reports doing very well.  Today, he denies symptoms of palpitations, chest pain, shortness of breath,  lower extremity edema, dizziness, presyncope, syncope, or ICD shocks.  The patient is otherwise without complaint today.   Past Medical History:  Diagnosis Date  . Acute cholecystitis 06/21/2011  . Allergy    SEASONAL  . Cataract   . Complication of anesthesia    HALLUCINATIONS  . GERD (gastroesophageal reflux disease)   . Hearing loss   . Hiatal hernia   . Hypertrophic cardiomyopathy ([place])   . Mitral regurgitation    Moderate  . Paroxysmal atrial fibrillation ([facility]) 04/2015   documented on ICD remote interrogation, treated with sotalol  . Septal defect    LV Septal hyptertrophy 25mm, normal EF, minimal LVOT gradiant.  . VT (ventricular tachycardia) ([facility]) 08/18/2013   s/p St. Jude ICD   Past Surgical History:  Procedure Laterality Date  . APPENDECTOMY  1950  . CARDIOVERSION N/A 02/25/2017   Procedure: CARDIOVERSION;  Surgeon: Fay Records, MD;  Location: [facility];  Service: Cardiovascular;  Laterality: N/A;  . CHOLECYSTECTOMY  06/24/2011  . COLONOSCOPY    . EP IMPLANTABLE DEVICE N/A 09/03/2016   SJM Fortify Assura Dr generator change by Dr Rayann Heman.  ICD implanted for secondary prevention of sudden death  . IMPLANTABLE CARDIOVERTER DEFIBRILLATOR IMPLANT  08/18/2013   St. Jude ICD, serial # [id number]  . IMPLANTABLE CARDIOVERTER DEFIBRILLATOR IMPLANT N/A 08/18/2013   Procedure: IMPLANTABLE CARDIOVERTER DEFIBRILLATOR IMPLANT;  Surgeon: Coralyn Mark, MD;  Location: [facility] CATH LAB;  Service: Cardiovascular;  Laterality: N/A;    ROS- all systems are reviewed and negative except as per HPI above  Current Outpatient Medications  Medication Sig Dispense Refill    . acidophilus (RISAQUAD) CAPS capsule Take 1 capsule by mouth daily.    . Coenzyme Q10 (CO Q 10) 100 MG CAPS Take 100 mg by mouth daily.     Marland Kitchen ELIQUIS 5 MG TABS tablet TAKE 1 TABLET BY MOUTH TWICE DAILY. 355 tablet 3  . folic acid (FOLVITE) 1 MG tablet Take 1 mg by mouth daily.     Marland Kitchen glucosamine-chondroitin 500-400 MG tablet Take 1 tablet by mouth daily.    . pantoprazole (PROTONIX) 40 MG tablet Take 40 mg by mouth daily.    . sotalol (BETAPACE) 120 MG tablet TAKE 1 TABLET(120 MG) BY MOUTH TWICE DAILY 180 tablet 2  . Zinc 50 MG CAPS Take 50 mg by mouth daily.      No current facility-administered medications for this visit.     Physical Exam: Vitals:   10/06/17 1107  BP: 124/62  Pulse: (!) 58  Weight: 178 lb (80.7 kg)  Height: 5\' 10"  (1.778 m)    GEN- The patient is well appearing, alert and oriented x 3 today.   Head- normocephalic, atraumatic Eyes-  Sclera clear, conjunctiva pink Ears- hearing intact Oropharynx- clear Lungs- Clear to ausculation bilaterally, normal work of breathing Chest- ICD pocket is well healed Heart- Regular rate and rhythm, no murmurs, rubs or gallops, PMI not laterally displaced GI- soft, NT, ND, + BS Extremities- no clubbing, cyanosis, or edema  ICD interrogation- reviewed in detail today,  See PACEART report  ekg tracing ordered today is personally reviewed and shows sinus rhythm 58 bpm, PR 222  msec, QRS 80 msec, Qtc 416 msec   Assessment and Plan:  1.  HCM euvolemic today Stable on an appropriate medical regimen Normal ICD function See Pace Art report No changes today  2. VT Well controlled   3. Persistent afib Maintaining sinus rhythm with sotalol He has severe biatrial enlargement but is maintaining sinus rhythm Qt is stable Labs 10/18 reviewed  4. Moderate MR Dr Marlou Porch to follow    Follow-up with Dr Marlou Porch in 3 months (due).  Will need bmet, mg then [facility] in 6 months I will see in a year  [name],  [facility] 10/06/2017 11:46 AM

## 2017-12-09 ENCOUNTER — Ambulatory Visit (INDEPENDENT_AMBULATORY_CARE_PROVIDER_SITE_OTHER): Payer: Medicare Other | Admitting: *Deleted

## 2017-12-09 DIAGNOSIS — I472 Ventricular tachycardia, unspecified: Secondary | ICD-10-CM

## 2017-12-10 NOTE — Progress Notes (Signed)
Remote ICD transmission.   

## 2017-12-13 ENCOUNTER — Encounter: Payer: Self-pay | Admitting: Cardiology

## 2017-12-23 LAB — CUP PACEART REMOTE DEVICE CHECK
Battery Remaining Percentage: 86 %
Battery Voltage: 3.04 V
Brady Statistic AP VP Percent: 1 %
Brady Statistic AP VS Percent: 30 %
Brady Statistic AS VP Percent: 1 %
Brady Statistic RA Percent Paced: 30 %
Brady Statistic RV Percent Paced: 1 %
HighPow Impedance: 47 Ohm
HighPow Impedance: 47 Ohm
Implantable Lead Location: 753860
Implantable Lead Model: 7121
Implantable Pulse Generator Implant Date: 20171221
Lead Channel Impedance Value: 440 Ohm
Lead Channel Impedance Value: 530 Ohm
Lead Channel Pacing Threshold Amplitude: 0.5 V
Lead Channel Pacing Threshold Pulse Width: 0.5 ms
Lead Channel Pacing Threshold Pulse Width: 0.5 ms
Lead Channel Sensing Intrinsic Amplitude: 12 mV
Lead Channel Setting Pacing Amplitude: 2 V
Lead Channel Setting Pacing Amplitude: 2.5 V
Lead Channel Setting Pacing Pulse Width: 0.5 ms
MDC IDC LEAD IMPLANT DT: 20141205
MDC IDC LEAD IMPLANT DT: 20141205
MDC IDC LEAD LOCATION: 753859
MDC IDC MSMT BATTERY REMAINING LONGEVITY: 87 mo
MDC IDC MSMT LEADCHNL RA PACING THRESHOLD AMPLITUDE: 1 V
MDC IDC MSMT LEADCHNL RA SENSING INTR AMPL: 3.9 mV
MDC IDC PG SERIAL: 7387049
MDC IDC SESS DTM: 20190328060017
MDC IDC SET LEADCHNL RV SENSING SENSITIVITY: 0.5 mV
MDC IDC STAT BRADY AS VS PERCENT: 69 %

## 2018-01-14 DIAGNOSIS — L821 Other seborrheic keratosis: Secondary | ICD-10-CM | POA: Diagnosis not present

## 2018-01-14 DIAGNOSIS — D1801 Hemangioma of skin and subcutaneous tissue: Secondary | ICD-10-CM | POA: Diagnosis not present

## 2018-01-14 DIAGNOSIS — L570 Actinic keratosis: Secondary | ICD-10-CM | POA: Diagnosis not present

## 2018-01-18 ENCOUNTER — Ambulatory Visit (INDEPENDENT_AMBULATORY_CARE_PROVIDER_SITE_OTHER): Payer: Medicare Other | Admitting: Cardiology

## 2018-01-18 ENCOUNTER — Encounter: Payer: Self-pay | Admitting: Cardiology

## 2018-01-18 VITALS — BP 108/72 | HR 53 | Ht 70.0 in | Wt 179.8 lb

## 2018-01-18 DIAGNOSIS — I481 Persistent atrial fibrillation: Secondary | ICD-10-CM

## 2018-01-18 DIAGNOSIS — I472 Ventricular tachycardia, unspecified: Secondary | ICD-10-CM

## 2018-01-18 DIAGNOSIS — I4819 Other persistent atrial fibrillation: Secondary | ICD-10-CM

## 2018-01-18 DIAGNOSIS — Z95810 Presence of automatic (implantable) cardiac defibrillator: Secondary | ICD-10-CM | POA: Diagnosis not present

## 2018-01-18 NOTE — Patient Instructions (Signed)
Medication Instructions:  Your physician recommends that you continue on your current medications as directed. Please refer to the Current Medication list given to you today.  If you need a refill on your cardiac medications, please contact your pharmacy first.  Labwork: None ordered   Testing/Procedures: None ordered   Follow-Up: Your physician wants you to follow-up in: 1 year with Dr. Marlou Porch. You will receive a reminder letter in the mail two months in advance. If you don't receive a letter, please call our office to schedule the follow-up appointment.  Any Other Special Instructions Will Be Listed Below (If Applicable).   Thank you for choosing [facility]    [phone]  If you need a refill on your cardiac medications before your next appointment, please call your pharmacy.

## 2018-01-18 NOTE — Progress Notes (Signed)
[facility]. [address] [place], [place]  [address] Phone: [phone] Fax:  [phone]  Date:  01/18/2018   ID:  Joseph Morrow, DOB 1946/06/01, MRN [id number]  PCP:  Marton Redwood, MD   History of Present Illness: Joseph Morrow is a 72 y.o. male who had ICD placed secondary to ventricular tachycardia heart rate of 210 beats per minute in the setting of hypertrophic cardiomyopathy with previous benign workup/evaluation including treadmill, monitoring. Had been on beta blocker. Overall he is doing well. No chest pain, no syncope. He did not have any syncope prior to defibrillator placement as well. Very rarely he feels orthostatic symptoms when standing up quickly. This is rare. He is walking, feeling well. Has gained a little bit of weight.  During a remote ICD monitoring check in November 2016 1 hour and 52 minutes of atrial fibrillation was detected.   He thinks that this episode may have coincided with an increase in alcohol/wine intake 1 evening. Otherwise, he is asymptomatic. He is walking well. Never had any prior stroke history. His brother does have atrial fibrillation. He understands correlation with stroke.  01/18/2018- overall doing well.  Reviewed Dr. Jackalyn Lombard note from 10/06/2017- no changes.  Sotalol working well, maintaining sinus rhythm, QT is stable.  Normal ICD function. Shoulder issue, PT. On Eliquis.  Feels great, no complaints.   Wt Readings from Last 3 Encounters:  01/18/18 179 lb 12.8 oz (81.6 kg)  10/06/17 178 lb (80.7 kg)  07/07/17 175 lb 6.4 oz (79.6 kg)     Past Medical History:  Diagnosis Date  . Acute cholecystitis 06/21/2011  . Allergy    SEASONAL  . Cataract   . Complication of anesthesia    HALLUCINATIONS  . GERD (gastroesophageal reflux disease)   . Hearing loss   . Hiatal hernia   . Hypertrophic cardiomyopathy ([facility])   . Mitral regurgitation    Moderate  . Paroxysmal atrial fibrillation ([facility]) 04/2015   documented on ICD  remote interrogation, treated with sotalol  . Septal defect    LV Septal hyptertrophy 45mm, normal EF, minimal LVOT gradiant.  . VT (ventricular tachycardia) ([place]) 08/18/2013   s/p St. Jude ICD    Past Surgical History:  Procedure Laterality Date  . APPENDECTOMY  1950  . CARDIOVERSION N/A 02/25/2017   Procedure: CARDIOVERSION;  Surgeon: Fay Records, MD;  Location: [facility];  Service: Cardiovascular;  Laterality: N/A;  . CHOLECYSTECTOMY  06/24/2011  . COLONOSCOPY    . EP IMPLANTABLE DEVICE N/A 09/03/2016   SJM Fortify Assura Dr generator change by Dr Rayann Heman.  ICD implanted for secondary prevention of sudden death  . IMPLANTABLE CARDIOVERTER DEFIBRILLATOR IMPLANT  08/18/2013   St. Jude Fortify Assura DR ICD implanted by Dr Rayann Heman for secondary treatment of VT  . IMPLANTABLE CARDIOVERTER DEFIBRILLATOR IMPLANT N/A 08/18/2013   Procedure: IMPLANTABLE CARDIOVERTER DEFIBRILLATOR IMPLANT;  Surgeon: Coralyn Emalina Dubreuil, MD;  Location: [facility] CATH LAB;  Service: Cardiovascular;  Laterality: N/A;    Current Outpatient Medications  Medication Sig Dispense Refill  . acidophilus (RISAQUAD) CAPS capsule Take 1 capsule by mouth daily.    . Coenzyme Q10 (CO Q 10) 100 MG CAPS Take 100 mg by mouth daily.     Marland Kitchen ELIQUIS 5 MG TABS tablet TAKE 1 TABLET BY MOUTH TWICE DAILY. 242 tablet 3  . folic acid (FOLVITE) 1 MG tablet Take 1 mg by mouth daily.     Marland Kitchen glucosamine-chondroitin 500-400 MG tablet Take 1 tablet by  mouth daily.    . pantoprazole (PROTONIX) 40 MG tablet Take 40 mg by mouth daily.    . sotalol (BETAPACE) 120 MG tablet TAKE 1 TABLET(120 MG) BY MOUTH TWICE DAILY 180 tablet 2  . Zinc 50 MG CAPS Take 50 mg by mouth daily.      No current facility-administered medications for this visit.     Allergies:    Allergies  Allergen Reactions  . Dextromethorphan Other (See Comments)    nervous  . Versed [Midazolam] Other (See Comments)    "hallucinations, headache"  . Antihistamines, Diphenhydramine-Type  Anxiety    Social History:  The patient  reports that he quit smoking about 36 years ago. His smoking use included cigarettes. He has a 40.00 pack-year smoking history. He has never used smokeless tobacco. He reports that he drinks alcohol. He reports that he does not use drugs.   ROS:  Please see the history of present illness.   Denies any syncope, bleeding, orthopnea, PND  PHYSICAL EXAM: VS:  BP 108/72   Pulse (!) 53   Ht 5\' 10"  (1.778 m)   Wt 179 lb 12.8 oz (81.6 kg)   SpO2 97%   BMI 25.80 kg/m  GEN: Well nourished, well developed, in no acute distress  HEENT: normal  Neck: no JVD, carotid bruits, or masses Cardiac: RRR; no significant murmurs, rubs, or gallops,no edema  Respiratory:  clear to auscultation bilaterally, normal work of breathing GI: soft, nontender, nondistended, + BS MS: no deformity or atrophy  Skin: warm and dry, no rash Neuro:  Alert and Oriented x 3, Strength and sensation are intact Psych: euthymic mood, full affect   EKG:  Today11/29/16 - sinus rhythm, 60, PVC, old septal infarct pattern. Personally viewed-prior Sinus bradycardia rate 54, no pacing, normal     ASSESSMENT AND PLAN:  1. Paroxysmal atrial fibrillation- maintaining sinus rhythm with sotalol.  Approximate 2 hours of atrial fib was detected on ICD monitoring check by Dr. Rayann Heman. CHADS-Vasc-2 (Age, hypertrophic cardiomyopathy). On Eliquis.  He is off aspirin.  Doing well, no bruising 2. Ventricular tachycardia-status post ICD, St. Jude early December 2014. Ventricular tachycardia heart rate of 210 initiated hospital stay.  Defibrillator. Answered questions. No defibrillations.  Doing well, stable 3. Hypertrophic cardiomyopathy-beta blocker, defibrillator, brother being seen at [facility]. His son, Saralyn Pilar also has defibrillator, similar morphology to his heart.  Stable. 4. Mild MR stable-echocardiogram reviewed.  No clinical changes. 5. We will see back in12 months.  Signed, Candee Furbish, MD  [facility]  01/18/2018 9:50 AM

## 2018-03-10 ENCOUNTER — Encounter: Payer: Self-pay | Admitting: Cardiology

## 2018-03-10 ENCOUNTER — Ambulatory Visit (INDEPENDENT_AMBULATORY_CARE_PROVIDER_SITE_OTHER): Payer: Medicare Other | Admitting: *Deleted

## 2018-03-10 DIAGNOSIS — I472 Ventricular tachycardia, unspecified: Secondary | ICD-10-CM

## 2018-03-10 NOTE — Progress Notes (Signed)
Remote ICD transmission.   

## 2018-03-15 LAB — CUP PACEART REMOTE DEVICE CHECK
Battery Remaining Longevity: 85 mo
Battery Remaining Percentage: 83 %
Brady Statistic AS VS Percent: 66 %
Brady Statistic RV Percent Paced: 1 %
Date Time Interrogation Session: 20190627060022
HIGH POWER IMPEDANCE MEASURED VALUE: 47 Ohm
HIGH POWER IMPEDANCE MEASURED VALUE: 48 Ohm
Implantable Lead Implant Date: 20141205
Implantable Lead Location: 753859
Implantable Lead Location: 753860
Implantable Pulse Generator Implant Date: 20171221
Lead Channel Impedance Value: 450 Ohm
Lead Channel Pacing Threshold Amplitude: 0.5 V
Lead Channel Pacing Threshold Amplitude: 1 V
Lead Channel Pacing Threshold Pulse Width: 0.5 ms
Lead Channel Sensing Intrinsic Amplitude: 4.1 mV
Lead Channel Setting Sensing Sensitivity: 0.5 mV
MDC IDC LEAD IMPLANT DT: 20141205
MDC IDC MSMT BATTERY VOLTAGE: 3.02 V
MDC IDC MSMT LEADCHNL RV IMPEDANCE VALUE: 580 Ohm
MDC IDC MSMT LEADCHNL RV PACING THRESHOLD PULSEWIDTH: 0.5 ms
MDC IDC MSMT LEADCHNL RV SENSING INTR AMPL: 12 mV
MDC IDC SET LEADCHNL RA PACING AMPLITUDE: 2 V
MDC IDC SET LEADCHNL RV PACING AMPLITUDE: 2.5 V
MDC IDC SET LEADCHNL RV PACING PULSEWIDTH: 0.5 ms
MDC IDC STAT BRADY AP VP PERCENT: 1 %
MDC IDC STAT BRADY AP VS PERCENT: 33 %
MDC IDC STAT BRADY AS VP PERCENT: 1 %
MDC IDC STAT BRADY RA PERCENT PACED: 33 %
Pulse Gen Serial Number: 7387049

## 2018-03-21 ENCOUNTER — Other Ambulatory Visit: Payer: Self-pay | Admitting: Physician Assistant

## 2018-03-21 ENCOUNTER — Other Ambulatory Visit: Payer: Self-pay | Admitting: Nurse Practitioner

## 2018-03-21 NOTE — Telephone Encounter (Signed)
Eliquis 5mg  refill request received; pt is 72 yrs old, wt-81.6kg, Crea-1.15 on 07/14/2017, last seen by Dr. Marlou Porch on 01/18/18; will send in refill to requested pharmacy.

## 2018-04-05 ENCOUNTER — Ambulatory Visit (HOSPITAL_COMMUNITY)
Admission: RE | Admit: 2018-04-05 | Discharge: 2018-04-05 | Disposition: A | Discharge status: Home / Self Care | Payer: Medicare Other | Source: Ambulatory Visit | Attending: Nurse Practitioner | Admitting: Nurse Practitioner

## 2018-04-05 ENCOUNTER — Encounter (HOSPITAL_COMMUNITY): Payer: Self-pay | Admitting: Nurse Practitioner

## 2018-04-05 VITALS — BP 108/72 | HR 53 | Ht 70.0 in | Wt 175.0 lb

## 2018-04-05 DIAGNOSIS — K219 Gastro-esophageal reflux disease without esophagitis: Secondary | ICD-10-CM | POA: Diagnosis not present

## 2018-04-05 DIAGNOSIS — I4891 Unspecified atrial fibrillation: Secondary | ICD-10-CM | POA: Diagnosis present

## 2018-04-05 DIAGNOSIS — Z8249 Family history of ischemic heart disease and other diseases of the circulatory system: Secondary | ICD-10-CM | POA: Diagnosis not present

## 2018-04-05 DIAGNOSIS — Z7901 Long term (current) use of anticoagulants: Secondary | ICD-10-CM | POA: Diagnosis not present

## 2018-04-05 DIAGNOSIS — I4589 Other specified conduction disorders: Secondary | ICD-10-CM | POA: Insufficient documentation

## 2018-04-05 DIAGNOSIS — Z9049 Acquired absence of other specified parts of digestive tract: Secondary | ICD-10-CM | POA: Insufficient documentation

## 2018-04-05 DIAGNOSIS — I481 Persistent atrial fibrillation: Secondary | ICD-10-CM | POA: Diagnosis not present

## 2018-04-05 DIAGNOSIS — I4819 Other persistent atrial fibrillation: Secondary | ICD-10-CM

## 2018-04-05 DIAGNOSIS — I422 Other hypertrophic cardiomyopathy: Secondary | ICD-10-CM | POA: Insufficient documentation

## 2018-04-05 DIAGNOSIS — I472 Ventricular tachycardia: Secondary | ICD-10-CM | POA: Insufficient documentation

## 2018-04-05 DIAGNOSIS — I480 Paroxysmal atrial fibrillation: Secondary | ICD-10-CM | POA: Diagnosis not present

## 2018-04-05 DIAGNOSIS — Z87891 Personal history of nicotine dependence: Secondary | ICD-10-CM | POA: Diagnosis not present

## 2018-04-05 DIAGNOSIS — Z8371 Family history of colonic polyps: Secondary | ICD-10-CM | POA: Diagnosis not present

## 2018-04-05 DIAGNOSIS — Z79899 Other long term (current) drug therapy: Secondary | ICD-10-CM | POA: Diagnosis not present

## 2018-04-05 DIAGNOSIS — K449 Diaphragmatic hernia without obstruction or gangrene: Secondary | ICD-10-CM | POA: Insufficient documentation

## 2018-04-05 DIAGNOSIS — Z95810 Presence of automatic (implantable) cardiac defibrillator: Secondary | ICD-10-CM | POA: Diagnosis not present

## 2018-04-05 DIAGNOSIS — I340 Nonrheumatic mitral (valve) insufficiency: Secondary | ICD-10-CM | POA: Diagnosis not present

## 2018-04-05 LAB — BASIC METABOLIC PANEL
Anion gap: 7 (ref 5–15)
BUN: 15 mg/dL (ref 8–23)
CO2: 27 mmol/L (ref 22–32)
Calcium: 9.3 mg/dL (ref 8.9–10.3)
Chloride: 105 mmol/L (ref 98–111)
Creatinine, Ser: 1.11 mg/dL (ref 0.61–1.24)
Glucose, Bld: 89 mg/dL (ref 70–99)
POTASSIUM: 4.2 mmol/L (ref 3.5–5.1)
SODIUM: 139 mmol/L (ref 135–145)

## 2018-04-05 LAB — MAGNESIUM: MAGNESIUM: 2 mg/dL (ref 1.7–2.4)

## 2018-04-05 NOTE — Progress Notes (Signed)
Primary Care Physician: Marton Redwood, MD Referring [facility] Triage EP: Dr. Everlean Patterson Joseph Morrow is a 72 y.o. male with a h/o hypertrophic cardiomyopathy, s/p ICD for VT, remote paroxysmal afib, maintaining SR with a very small afib burden on Sotalol. Hospitalized for sotalol administration 02/2017.  He reports that he has not been bothered with any heart irregularity. Chadsvasc score of 2(age, LV dysfunction), continues on eliquis 5 mg bid.  Qtc stable.   Today, he denies symptoms of   chest pain, shortness of breath, orthopnea, PND, lower extremity edema, dizziness, presyncope, syncope, or neurologic sequela.  Positive for palpitations and fatigue.The patient is tolerating medications without difficulties and is otherwise without complaint today.   Past Medical History:  Diagnosis Date  . Acute cholecystitis 06/21/2011  . Allergy    SEASONAL  . Cataract   . Complication of anesthesia    HALLUCINATIONS  . GERD (gastroesophageal reflux disease)   . Hearing loss   . Hiatal hernia   . Hypertrophic cardiomyopathy ([place])   . Mitral regurgitation    Moderate  . Paroxysmal atrial fibrillation ([place]) 04/2015   documented on ICD remote interrogation, treated with sotalol  . Septal defect    LV Septal hyptertrophy 47mm, normal EF, minimal LVOT gradiant.  . VT (ventricular tachycardia) ([place]) 08/18/2013   s/p St. Jude ICD   Past Surgical History:  Procedure Laterality Date  . APPENDECTOMY  1950  . CARDIOVERSION N/A 02/25/2017   Procedure: CARDIOVERSION;  Surgeon: Fay Records, MD;  Location: [facility];  Service: Cardiovascular;  Laterality: N/A;  . CHOLECYSTECTOMY  06/24/2011  . COLONOSCOPY    . EP IMPLANTABLE DEVICE N/A 09/03/2016   SJM Fortify Assura Dr generator change by Dr Rayann Heman.  ICD implanted for secondary prevention of sudden death  . IMPLANTABLE CARDIOVERTER DEFIBRILLATOR IMPLANT  08/18/2013   St. Jude Fortify Assura DR ICD implanted by Dr Rayann Heman  for secondary treatment of VT  . IMPLANTABLE CARDIOVERTER DEFIBRILLATOR IMPLANT N/A 08/18/2013   Procedure: IMPLANTABLE CARDIOVERTER DEFIBRILLATOR IMPLANT;  Surgeon: Coralyn Mark, MD;  Location: [facility];  Service: Cardiovascular;  Laterality: N/A;    Current Outpatient Medications  Medication Sig Dispense Refill  . acidophilus (RISAQUAD) CAPS capsule Take 1 capsule by mouth daily.    . Coenzyme Q10 (CO Q 10) 100 MG CAPS Take 100 mg by mouth daily.     Marland Kitchen ELIQUIS 5 MG TABS tablet TAKE 1 TABLET BY MOUTH TWICE DAILY. 180 tablet 3  . glucosamine-chondroitin 500-400 MG tablet Take 1 tablet by mouth daily.    . pantoprazole (PROTONIX) 40 MG tablet Take 40 mg by mouth daily.    . simethicone (GAS-X) 80 MG chewable tablet Chew 80 mg by mouth every 6 (six) hours as needed for flatulence.    . sotalol (BETAPACE) 120 MG tablet TAKE 1 TABLET(120 MG) BY MOUTH TWICE DAILY 180 tablet 3   No current facility-administered medications for this encounter.     Allergies  Allergen Reactions  . Dextromethorphan Other (See Comments)    nervous  . Versed [Midazolam] Other (See Comments)    "hallucinations, headache"  . Antihistamines, Diphenhydramine-Type Anxiety    Social History   Socioeconomic History  . Marital status: Married    Spouse name: Not on file  . Number of children: 2  . Years of education: Not on file  . Highest education level: Not on file  Occupational History  . Occupation: retired  Scientific laboratory technician  . Emergency planning/management officer  strain: Not on file  . Food insecurity:    Worry: Not on file    Inability: Not on file  . Transportation needs:    Medical: Not on file    Non-medical: Not on file  Tobacco Use  . Smoking status: Former Smoker    Packs/day: 2.00    Years: 20.00    Pack years: 40.00    Types: Cigarettes    Last attempt to quit: 07/06/1981    Years since quitting: 36.7  . Smokeless tobacco: Never Used  Substance and Sexual Activity  . Alcohol use: Yes     Alcohol/week: 0.0 oz    Comment: 2 glasses per day  . Drug use: No  . Sexual activity: Not on file  Lifestyle  . Physical activity:    Days per week: Not on file    Minutes per session: Not on file  . Stress: Not on file  Relationships  . Social connections:    Talks on phone: Not on file    Gets together: Not on file    Attends religious service: Not on file    Active member of club or organization: Not on file    Attends meetings of clubs or organizations: Not on file    Relationship status: Not on file  . Intimate partner violence:    Fear of current or ex partner: Not on file    Emotionally abused: Not on file    Physically abused: Not on file    Forced sexual activity: Not on file  Other Topics Concern  . Not on file  Social History Narrative   Originally from [place]. Moved to [place] in 2012. Currently has no pets. No bird or hot tub exposure. 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 He did have exposure to Beneze, Acetone, Styrene, & Hydrocarbon early in his career.    Family History  Problem Relation Age of Onset  . Hypertrophic cardiomyopathy Mother   . Arrhythmia Mother   . Heart failure Mother   . Pneumonia Father   . Diabetes Father   . Colon polyps Father   . Heart attack Brother   . Hypertrophic cardiomyopathy Brother   . Heart Problems Brother        VAD  . Arrhythmia Brother   . Diabetes Brother   . Sudden death Brother   . Lung disease Neg Hx     ROS- All systems are reviewed and negative except as per the HPI above  Physical Exam: Vitals:   04/05/18 1030  BP: 108/72  Pulse: (!) 53  Weight: 175 lb (79.4 kg)  Height: 5\' 10"  (1.778 m)   Wt Readings from Last 3 Encounters:  04/05/18 175 lb (79.4 kg)  01/18/18 179 lb 12.8 oz (81.6 kg)  10/06/17 178 lb (80.7 kg)    Labs: Lab Results  Component Value Date   NA 139 04/05/2018   K 4.2 04/05/2018   CL 105 04/05/2018   CO2 27 04/05/2018   GLUCOSE 89  04/05/2018   BUN 15 04/05/2018   CREATININE 1.11 04/05/2018   CALCIUM 9.3 04/05/2018   MG 2.0 04/05/2018   No results found for: INR No results found for: CHOL, HDL, LDLCALC, TRIG   GEN- The patient is well appearing, alert and oriented x 3 today.   Head- normocephalic, atraumatic Eyes-  Sclera clear, conjunctiva pink Ears- hearing intact Oropharynx- clear Neck- supple, no JVP Lymph- no cervical lymphadenopathy Lungs- Clear to ausculation bilaterally,  normal work of breathing Heart-regular rate and rhythm, no murmurs, rubs or gallops, PMI not laterally displaced GI- soft, NT, ND, + BS Extremities- no clubbing, cyanosis, or edema MS- no significant deformity or atrophy Skin- no rash or lesion Psych- euthymic mood, full affect Neuro- strength and sensation are intact  EKG-atrial paced rhythm  pr int 302 ms, qrs int 74 ms, qtc 441 ms . Echo- 2018-------------------------------------------------------------------- Study Conclusions  - Left ventricle: The cavity size was normal. There was moderate   asymmetric hypertrophy of the septum. The appearance was   consistent with hypertrophic cardiomyopathy. Systolic function   was vigorous. The estimated ejection fraction was in the range of   65% to 70%. There was no dynamic obstruction. Wall motion was   normal; there were no regional wall motion abnormalities. - Mitral valve: There was mild to moderate regurgitation directed   centrally. - Left atrium: The atrium was severely dilated. - Right ventricle: The cavity size was mildly dilated. - Right atrium: The atrium was mildly dilated. - Tricuspid valve: There was moderate regurgitation. - Pulmonary arteries: Systolic pressure was mildly increased. PA   peak pressure: 41 mm Hg (S).   Assessment and Plan: 1. Paroxysmal afib Quiet since Sotalol initiation 02/2017 Continue Sotalol 120 mg bid Continue eliquis 5 mg bid  bmet/mag today  2. HCM Stable  Pr Dr. Rayann Heman  3.  ICD Per Dr. Nathen May clinc  F/u with Chanetta Marshall, NP 09/2017  Joseph Morrow. Joseph Morrow, [facility] [address] [place], [place] [address] [phone]

## 2018-04-25 ENCOUNTER — Telehealth: Payer: Self-pay | Admitting: *Deleted

## 2018-04-25 NOTE — Telephone Encounter (Signed)
Called patient to explain lower rate and rate response settings on PPM.

## 2018-05-24 DIAGNOSIS — L245 Irritant contact dermatitis due to other chemical products: Secondary | ICD-10-CM | POA: Diagnosis not present

## 2018-05-24 DIAGNOSIS — L239 Allergic contact dermatitis, unspecified cause: Secondary | ICD-10-CM | POA: Diagnosis not present

## 2018-06-09 ENCOUNTER — Ambulatory Visit (INDEPENDENT_AMBULATORY_CARE_PROVIDER_SITE_OTHER): Payer: Medicare Other | Admitting: *Deleted

## 2018-06-09 DIAGNOSIS — I472 Ventricular tachycardia, unspecified: Secondary | ICD-10-CM

## 2018-06-09 NOTE — Progress Notes (Signed)
Remote ICD transmission.   

## 2018-06-10 ENCOUNTER — Encounter: Payer: Self-pay | Admitting: Cardiology

## 2018-06-24 DIAGNOSIS — R7301 Impaired fasting glucose: Secondary | ICD-10-CM | POA: Diagnosis not present

## 2018-06-24 DIAGNOSIS — R82998 Other abnormal findings in urine: Secondary | ICD-10-CM | POA: Diagnosis not present

## 2018-06-24 DIAGNOSIS — Z125 Encounter for screening for malignant neoplasm of prostate: Secondary | ICD-10-CM | POA: Diagnosis not present

## 2018-06-24 DIAGNOSIS — E781 Pure hyperglyceridemia: Secondary | ICD-10-CM | POA: Diagnosis not present

## 2018-06-30 DIAGNOSIS — Z1212 Encounter for screening for malignant neoplasm of rectum: Secondary | ICD-10-CM | POA: Diagnosis not present

## 2018-07-01 DIAGNOSIS — Z1389 Encounter for screening for other disorder: Secondary | ICD-10-CM | POA: Diagnosis not present

## 2018-07-01 DIAGNOSIS — M25511 Pain in right shoulder: Secondary | ICD-10-CM | POA: Diagnosis not present

## 2018-07-01 DIAGNOSIS — Z95810 Presence of automatic (implantable) cardiac defibrillator: Secondary | ICD-10-CM | POA: Diagnosis not present

## 2018-07-01 DIAGNOSIS — I421 Obstructive hypertrophic cardiomyopathy: Secondary | ICD-10-CM | POA: Diagnosis not present

## 2018-07-01 DIAGNOSIS — Z23 Encounter for immunization: Secondary | ICD-10-CM | POA: Diagnosis not present

## 2018-07-01 DIAGNOSIS — R7301 Impaired fasting glucose: Secondary | ICD-10-CM | POA: Diagnosis not present

## 2018-07-01 DIAGNOSIS — Z8679 Personal history of other diseases of the circulatory system: Secondary | ICD-10-CM | POA: Diagnosis not present

## 2018-07-01 DIAGNOSIS — E781 Pure hyperglyceridemia: Secondary | ICD-10-CM | POA: Diagnosis not present

## 2018-07-01 DIAGNOSIS — Z7901 Long term (current) use of anticoagulants: Secondary | ICD-10-CM | POA: Diagnosis not present

## 2018-07-01 DIAGNOSIS — Z0000 Encounter for general adult medical examination without abnormal findings: Secondary | ICD-10-CM | POA: Diagnosis not present

## 2018-07-01 DIAGNOSIS — Z6826 Body mass index (BMI) 26.0-26.9, adult: Secondary | ICD-10-CM | POA: Diagnosis not present

## 2018-07-01 DIAGNOSIS — I480 Paroxysmal atrial fibrillation: Secondary | ICD-10-CM | POA: Diagnosis not present

## 2018-07-01 DIAGNOSIS — M3500 Sicca syndrome, unspecified: Secondary | ICD-10-CM | POA: Diagnosis not present

## 2018-07-08 LAB — CUP PACEART REMOTE DEVICE CHECK
Battery Remaining Longevity: 82 mo
Battery Remaining Percentage: 81 %
Battery Voltage: 3.01 V
Brady Statistic AP VP Percent: 1.1 %
Brady Statistic AP VS Percent: 37 %
Brady Statistic AS VP Percent: 1 %
Brady Statistic AS VS Percent: 61 %
Brady Statistic RA Percent Paced: 37 %
Brady Statistic RV Percent Paced: 1.3 %
Date Time Interrogation Session: 20190926060016
HighPow Impedance: 49 Ohm
HighPow Impedance: 50 Ohm
Implantable Lead Implant Date: 20141205
Implantable Lead Implant Date: 20141205
Implantable Lead Location: 753859
Implantable Lead Location: 753860
Implantable Lead Model: 7121
Implantable Pulse Generator Implant Date: 20171221
Lead Channel Impedance Value: 460 Ohm
Lead Channel Impedance Value: 590 Ohm
Lead Channel Pacing Threshold Amplitude: 0.5 V
Lead Channel Pacing Threshold Amplitude: 1 V
Lead Channel Pacing Threshold Pulse Width: 0.5 ms
Lead Channel Pacing Threshold Pulse Width: 0.5 ms
Lead Channel Sensing Intrinsic Amplitude: 12 mV
Lead Channel Sensing Intrinsic Amplitude: 5 mV
Lead Channel Setting Pacing Amplitude: 2 V
Lead Channel Setting Pacing Amplitude: 2.5 V
Lead Channel Setting Pacing Pulse Width: 0.5 ms
Lead Channel Setting Sensing Sensitivity: 0.5 mV
Pulse Gen Serial Number: 7387049

## 2018-07-26 DIAGNOSIS — Z13820 Encounter for screening for osteoporosis: Secondary | ICD-10-CM | POA: Diagnosis not present

## 2018-09-08 ENCOUNTER — Ambulatory Visit (INDEPENDENT_AMBULATORY_CARE_PROVIDER_SITE_OTHER): Payer: Medicare Other

## 2018-09-08 DIAGNOSIS — I472 Ventricular tachycardia, unspecified: Secondary | ICD-10-CM

## 2018-09-08 DIAGNOSIS — I422 Other hypertrophic cardiomyopathy: Secondary | ICD-10-CM

## 2018-09-08 NOTE — Progress Notes (Signed)
Remote ICD transmission.   

## 2018-09-10 LAB — CUP PACEART REMOTE DEVICE CHECK
Battery Remaining Longevity: 80 mo
Brady Statistic AP VS Percent: 37 %
Brady Statistic RA Percent Paced: 37 %
Brady Statistic RV Percent Paced: 1.3 %
HIGH POWER IMPEDANCE MEASURED VALUE: 49 Ohm
HIGH POWER IMPEDANCE MEASURED VALUE: 49 Ohm
Implantable Lead Implant Date: 20141205
Implantable Lead Implant Date: 20141205
Implantable Lead Location: 753859
Implantable Lead Model: 7121
Implantable Pulse Generator Implant Date: 20171221
Lead Channel Impedance Value: 450 Ohm
Lead Channel Pacing Threshold Amplitude: 0.5 V
Lead Channel Pacing Threshold Amplitude: 1 V
Lead Channel Pacing Threshold Pulse Width: 0.5 ms
Lead Channel Sensing Intrinsic Amplitude: 5 mV
Lead Channel Setting Pacing Amplitude: 2.5 V
Lead Channel Setting Pacing Pulse Width: 0.5 ms
Lead Channel Setting Sensing Sensitivity: 0.5 mV
MDC IDC LEAD LOCATION: 753860
MDC IDC MSMT BATTERY REMAINING PERCENTAGE: 79 %
MDC IDC MSMT BATTERY VOLTAGE: 3.01 V
MDC IDC MSMT LEADCHNL RA PACING THRESHOLD PULSEWIDTH: 0.5 ms
MDC IDC MSMT LEADCHNL RV IMPEDANCE VALUE: 530 Ohm
MDC IDC MSMT LEADCHNL RV SENSING INTR AMPL: 12 mV
MDC IDC PG SERIAL: 7387049
MDC IDC SESS DTM: 20191226070016
MDC IDC SET LEADCHNL RA PACING AMPLITUDE: 2 V
MDC IDC STAT BRADY AP VP PERCENT: 1.1 %
MDC IDC STAT BRADY AS VP PERCENT: 1 %
MDC IDC STAT BRADY AS VS PERCENT: 62 %

## 2018-09-17 ENCOUNTER — Other Ambulatory Visit: Payer: Self-pay | Admitting: Internal Medicine

## 2018-10-12 NOTE — Progress Notes (Signed)
Electrophysiology Office Note Date: 10/13/2018  ID:  Joseph Morrow, DOB [birth year], MRN [id number]  PCP: Marton Redwood, MD Primary Cardiologist: Marlou Porch Electrophysiologist: Allred  CC: Routine ICD follow-up  Joseph Morrow is a 73 y.o. male seen today for Dr Rayann Heman.  He presents today for routine electrophysiology followup.  Since last being seen in our clinic, the patient reports doing very well. He denies chest pain, palpitations, dyspnea, PND, orthopnea, nausea, vomiting, dizziness, syncope, edema, weight gain, or early satiety.  He has not had ICD shocks.   Device History: STJ dual chamber ICD implanted 2014; gen change 2017 History of appropriate therapy: yes History of AAD therapy: Sotalol   Past Medical History:  Diagnosis Date  . Acute cholecystitis 06/21/2011  . Allergy    SEASONAL  . Cataract   . Complication of anesthesia    HALLUCINATIONS  . GERD (gastroesophageal reflux disease)   . Hearing loss   . Hiatal hernia   . Hypertrophic cardiomyopathy ([place])   . Mitral regurgitation    Moderate  . Paroxysmal atrial fibrillation ([place]) 04/2015   documented on ICD remote interrogation, treated with sotalol  . Septal defect    LV Septal hyptertrophy 79mm, normal EF, minimal LVOT gradiant.  . VT (ventricular tachycardia) ([place]) 08/18/2013   s/p St. Jude ICD   Past Surgical History:  Procedure Laterality Date  . APPENDECTOMY  1950  . CARDIOVERSION N/A 02/25/2017   Procedure: CARDIOVERSION;  Surgeon: Fay Records, MD;  Location: [facility];  Service: Cardiovascular;  Laterality: N/A;  . CHOLECYSTECTOMY  06/24/2011  . COLONOSCOPY    . EP IMPLANTABLE DEVICE N/A 09/03/2016   SJM Fortify Assura Dr generator change by Dr Rayann Heman.  ICD implanted for secondary prevention of sudden death  . IMPLANTABLE CARDIOVERTER DEFIBRILLATOR IMPLANT  08/18/2013   St. Jude Fortify Assura DR ICD implanted by Dr Rayann Heman for secondary treatment of VT  . IMPLANTABLE CARDIOVERTER  DEFIBRILLATOR IMPLANT N/A 08/18/2013   Procedure: IMPLANTABLE CARDIOVERTER DEFIBRILLATOR IMPLANT;  Surgeon: Coralyn Mark, MD;  Location: [facility];  Service: Cardiovascular;  Laterality: N/A;    Current Outpatient Medications  Medication Sig Dispense Refill  . acidophilus (RISAQUAD) CAPS capsule Take 1 capsule by mouth daily.    . Calcium Carb-Cholecalciferol (CALCIUM 600+D) 600-800 MG-UNIT TABS Take 1 tablet by mouth daily.    . Cholecalciferol (VITAMIN D3) 25 MCG (1000 UT) CAPS Take 1 capsule by mouth daily.    . Coenzyme Q10 (CO Q 10) 100 MG CAPS Take 100 mg by mouth daily.     Marland Kitchen ELIQUIS 5 MG TABS tablet TAKE 1 TABLET BY MOUTH TWICE DAILY 60 tablet 5  . glucosamine-chondroitin 500-400 MG tablet Take 1 tablet by mouth daily.    . pantoprazole (PROTONIX) 40 MG tablet Take 40 mg by mouth daily.    . sotalol (BETAPACE) 120 MG tablet TAKE 1 TABLET(120 MG) BY MOUTH TWICE DAILY 180 tablet 3  . Zinc 50 MG CAPS Take 1 capsule by mouth daily.     No current facility-administered medications for this visit.     Allergies:   Dextromethorphan; Versed [midazolam]; and Antihistamines, diphenhydramine-type   Social History: Social History   Socioeconomic History  . Marital status: Married    Spouse name: Not on file  . Number of children: 2  . Years of education: Not on file  . Highest education level: Not on file  Occupational History  . Occupation: retired  Scientific laboratory technician  . Financial resource strain: Not on file  .  Food insecurity:    Worry: Not on file    Inability: Not on file  . Transportation needs:    Medical: Not on file    Non-medical: Not on file  Tobacco Use  . Smoking status: Former Smoker    Packs/day: 2.00    Years: 20.00    Pack years: 40.00    Types: Cigarettes    Last attempt to quit: 07/06/1981    Years since quitting: 37.2  . Smokeless tobacco: Never Used  Substance and Sexual Activity  . Alcohol use: Yes    Alcohol/week: 0.0 standard drinks    Comment: 2  glasses per day  . Drug use: No  . Sexual activity: Not on file  Lifestyle  . Physical activity:    Days per week: Not on file    Minutes per session: Not on file  . Stress: Not on file  Relationships  . Social connections:    Talks on phone: Not on file    Gets together: Not on file    Attends religious service: Not on file    Active member of club or organization: Not on file    Attends meetings of clubs or organizations: Not on file    Relationship status: Not on file  . Intimate partner violence:    Fear of current or ex partner: Not on file    Emotionally abused: Not on file    Physically abused: Not on file    Forced sexual activity: Not on file  Other Topics Concern  . Not on file  Social History Narrative   Originally from [place]. Moved to [place] in 2012. Currently has no pets. No bird or hot tub exposure. 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 He did have exposure to Beneze, Acetone, Styrene, & Hydrocarbon early in his career.    Family History: Family History  Problem Relation Age of Onset  . Hypertrophic cardiomyopathy Mother   . Arrhythmia Mother   . Heart failure Mother   . Pneumonia Father   . Diabetes Father   . Colon polyps Father   . Heart attack Brother   . Hypertrophic cardiomyopathy Brother   . Heart Problems Brother        VAD  . Arrhythmia Brother   . Diabetes Brother   . Sudden death Brother   . Lung disease Neg Hx     Review of Systems: All other systems reviewed and are otherwise negative except as noted above.   Physical Exam: VS:  BP 120/76   Pulse (!) 53   Ht 5\' 10"  (1.778 m)   Wt 177 lb 9.6 oz (80.6 kg)   SpO2 96%   BMI 25.48 kg/m  , BMI Body mass index is 25.48 kg/m.  GEN- The patient is well appearing, alert and oriented x 3 today.   HEENT: normocephalic, atraumatic; sclera clear, conjunctiva pink; hearing intact; oropharynx clear; neck supple, no JVP Lymph- no cervical  lymphadenopathy Lungs- Clear to ausculation bilaterally, normal work of breathing.  No wheezes, rales, rhonchi Heart- Regular rate and rhythm, no murmurs, rubs or gallops, PMI not laterally displaced GI- soft, non-tender, non-distended, bowel sounds present, no hepatosplenomegaly Extremities- no clubbing, cyanosis, or edema; DP/PT/radial pulses 2+ bilaterally MS- no significant deformity or atrophy Skin- warm and dry, no rash or lesion; ICD pocket well healed Psych- euthymic mood, full affect Neuro- strength and sensation are intact  ICD interrogation- reviewed in detail today,  See PACEART report  EKG:  EKG is ordered today. The ekg ordered today shows sinus brady, rate 53, QTc 410msec  Recent Labs: 04/05/2018: BUN 15; Creatinine, Ser 1.11; Magnesium 2.0; Potassium 4.2; Sodium 139   Wt Readings from Last 3 Encounters:  10/13/18 177 lb 9.6 oz (80.6 kg)  04/05/18 175 lb (79.4 kg)  01/18/18 179 lb 12.8 oz (81.6 kg)     Other studies Reviewed: Additional studies/ records that were reviewed today include: Dr Rayann Heman and Dr Marlou Porch' office notes   Assessment and Plan:  1.  HCM euvolemic today Stable on an appropriate medical regimen Normal ICD function See Pace Art report No changes today  2.  VT No recent recurrence  3.  Persistent atrial fibrillation Burden by device interrogation <1% Continue Eliquis, no bleeding issues Continue Sotalol. QTc stable. BMET, Mg today  4.  Moderate MR Followed by Dr Marlou Porch    Current medicines are reviewed at length with the patient today.   The patient does not have concerns regarding his medicines.  The following changes were made today:  none  Labs/ tests ordered today include: BMET, Mg, CBC Orders Placed This Encounter  Procedures  . CBC  . Basic Metabolic Panel (BMET)  . Magnesium  . CUP PACEART [place]  . EKG 12-Lead     Disposition:   Follow up with Delilah Shan, Dr Marlou Porch as scheduled, Dr Rayann Heman 6  months   Signed, Chanetta Marshall, NP 10/13/2018 8:48 AM  [facility] [address] [place] [address] [phone] (office) [phone] (fax)

## 2018-10-13 ENCOUNTER — Encounter: Payer: Self-pay | Admitting: Nurse Practitioner

## 2018-10-13 ENCOUNTER — Ambulatory Visit (INDEPENDENT_AMBULATORY_CARE_PROVIDER_SITE_OTHER): Payer: Medicare Other | Admitting: Nurse Practitioner

## 2018-10-13 VITALS — BP 120/76 | HR 53 | Ht 70.0 in | Wt 177.6 lb

## 2018-10-13 DIAGNOSIS — I472 Ventricular tachycardia, unspecified: Secondary | ICD-10-CM

## 2018-10-13 DIAGNOSIS — I422 Other hypertrophic cardiomyopathy: Secondary | ICD-10-CM

## 2018-10-13 DIAGNOSIS — I340 Nonrheumatic mitral (valve) insufficiency: Secondary | ICD-10-CM | POA: Diagnosis not present

## 2018-10-13 DIAGNOSIS — I4819 Other persistent atrial fibrillation: Secondary | ICD-10-CM

## 2018-10-13 LAB — CUP PACEART INCLINIC DEVICE CHECK
Implantable Lead Implant Date: 20141205
Implantable Lead Location: 753859
Implantable Lead Location: 753860
Implantable Pulse Generator Implant Date: 20171221
MDC IDC LEAD IMPLANT DT: 20141205
MDC IDC PG SERIAL: 7387049
MDC IDC SESS DTM: 20200130084814

## 2018-10-13 NOTE — Patient Instructions (Signed)
Medication Instructions:  NONE If you need a refill on your cardiac medications before your next appointment, please call your pharmacy.   Lab work:TODAY CBC BMET MAGNESIUM If you have labs (blood work) drawn today and your tests are completely normal, you will receive your results only by: Marland Kitchen MyChart Message (if you have MyChart) OR . A paper copy in the mail If you have any lab test that is abnormal or we need to change your treatment, we will call you to review the results.  Testing/Procedures: NONE  Follow-Up: At [facility], you and your health needs are our priority.  As part of our continuing mission to provide you with exceptional heart care, we have created designated Provider Care Teams.  These Care Teams include your primary Cardiologist (physician) and Advanced Practice Providers (APPs -  Physician Assistants and Nurse Practitioners) who all work together to provide you with the care you need, when you need it. You will need a follow up appointment in 6 months.  Please call our office 2 months in advance to schedule this appointment.  You may see Dr Rayann Heman or one of the following Advanced Practice Providers on your designated Care Team:   Chanetta Marshall, NP . Tommye Standard, PA-C  Any Other Special Instructions Will Be Listed Below (If Applicable). Remote monitoring is used to monitor your ICD from home. This monitoring reduces the number of office visits required to check your device to one time per year. It allows [place] to keep an eye on the functioning of your device to ensure it is working properly. You are scheduled for a device check from home on 01/12/19. You may send your transmission at any time that day. If you have a wireless device, the transmission will be sent automatically. After your physician reviews your transmission, you will receive a postcard with your next transmission date.

## 2018-10-14 ENCOUNTER — Telehealth: Payer: Self-pay

## 2018-10-14 LAB — CBC
HEMATOCRIT: 46.2 % (ref 37.5–51.0)
HEMOGLOBIN: 15.6 g/dL (ref 13.0–17.7)
MCH: 29.8 pg (ref 26.6–33.0)
MCHC: 33.8 g/dL (ref 31.5–35.7)
MCV: 88 fL (ref 79–97)
Platelets: 196 10*3/uL (ref 150–450)
RBC: 5.23 x10E6/uL (ref 4.14–5.80)
RDW: 12.7 % (ref 11.6–15.4)
WBC: 5.5 10*3/uL (ref 3.4–10.8)

## 2018-10-14 LAB — BASIC METABOLIC PANEL
BUN / CREAT RATIO: 14 (ref 10–24)
BUN: 16 mg/dL (ref 8–27)
CALCIUM: 9 mg/dL (ref 8.6–10.2)
CO2: 25 mmol/L (ref 20–29)
CREATININE: 1.15 mg/dL (ref 0.76–1.27)
Chloride: 101 mmol/L (ref 96–106)
GFR calc non Af Amer: 63 mL/min/{1.73_m2} (ref >59)
GFR, EST AFRICAN AMERICAN: 73 mL/min/{1.73_m2} (ref >59)
Glucose: 84 mg/dL (ref 65–99)
Potassium: 4.3 mmol/L (ref 3.5–5.2)
Sodium: 141 mmol/L (ref 134–144)

## 2018-10-14 LAB — MAGNESIUM: Magnesium: 2 mg/dL (ref 1.6–2.3)

## 2018-10-14 NOTE — Telephone Encounter (Signed)
-----   Message from Patsey Berthold, NP sent at 10/14/2018  2:54 PM EST ----- Please notify patient of stable labs. Thanks!

## 2018-10-14 NOTE — Telephone Encounter (Signed)
Notes recorded by Frederik Schmidt, RN on 10/14/2018 at 3:53 PM EST Lpm with results and told him to call, if questions. ------

## 2018-10-25 ENCOUNTER — Telehealth: Payer: Self-pay | Admitting: Nurse Practitioner

## 2018-10-25 NOTE — Telephone Encounter (Signed)
Patient called back and stated that he believes that he is in afib and wants to send a remote transmission w/ his home monitor. He would also like some education about how to reduce the amount of afib he is having.

## 2018-10-25 NOTE — Telephone Encounter (Signed)
New Message   STAT if HR is under 50 or over 120 (normal HR is 60-100 beats per minute)  1) What is your heart rate? 70's  2) Do you have a log of your heart rate readings (document readings)? Not at the time  3) Do you have any other symptoms? Patient states that his HR has been erractic but mostly in the 70's. He thinks he may be in afib and he just wants [facility] to run a test. Please call.

## 2018-10-25 NOTE — Telephone Encounter (Signed)
Spoke with pt informed him that since Jan 30 he had only been in atrial fibrillation 3% of the time pt stated that he felt like that was a lot, pt requested apt to discuss the amount of AF pt agreeable to apt on 2/12 w/ AF clinic.

## 2018-10-26 ENCOUNTER — Ambulatory Visit (HOSPITAL_COMMUNITY)
Admission: RE | Admit: 2018-10-26 | Discharge: 2018-10-26 | Disposition: A | Discharge status: Home / Self Care | Payer: Medicare Other | Source: Ambulatory Visit | Attending: Nurse Practitioner | Admitting: Nurse Practitioner

## 2018-10-26 ENCOUNTER — Encounter (HOSPITAL_COMMUNITY): Payer: Self-pay | Admitting: Nurse Practitioner

## 2018-10-26 VITALS — BP 128/84 | HR 56 | Ht 70.0 in | Wt 178.2 lb

## 2018-10-26 DIAGNOSIS — Z7901 Long term (current) use of anticoagulants: Secondary | ICD-10-CM | POA: Insufficient documentation

## 2018-10-26 DIAGNOSIS — H9190 Unspecified hearing loss, unspecified ear: Secondary | ICD-10-CM | POA: Diagnosis not present

## 2018-10-26 DIAGNOSIS — I340 Nonrheumatic mitral (valve) insufficiency: Secondary | ICD-10-CM | POA: Diagnosis not present

## 2018-10-26 DIAGNOSIS — Z87891 Personal history of nicotine dependence: Secondary | ICD-10-CM | POA: Insufficient documentation

## 2018-10-26 DIAGNOSIS — K219 Gastro-esophageal reflux disease without esophagitis: Secondary | ICD-10-CM | POA: Diagnosis not present

## 2018-10-26 DIAGNOSIS — Z79899 Other long term (current) drug therapy: Secondary | ICD-10-CM | POA: Diagnosis not present

## 2018-10-26 DIAGNOSIS — I422 Other hypertrophic cardiomyopathy: Secondary | ICD-10-CM | POA: Insufficient documentation

## 2018-10-26 DIAGNOSIS — I480 Paroxysmal atrial fibrillation: Secondary | ICD-10-CM

## 2018-10-26 DIAGNOSIS — H269 Unspecified cataract: Secondary | ICD-10-CM | POA: Insufficient documentation

## 2018-10-26 NOTE — Progress Notes (Signed)
Primary Care Physician: Marton Redwood, MD Referring [place] Triage EP: Dr. Everlean Patterson Eshbach is a 73 y.o. male with a h/o hypertrophic cardiomyopathy, s/p ICD for VT, remote paroxysmal afib, maintaining SR with a very small afib burden on Sotalol. Hospitalized for sotalol administration 02/2017. Chadsvasc score of 2(age, LV dysfunction), continues on eliquis 5 mg bid.  Qtc stable.   He asked to be seen today for afib noted yesterday. Interrogation of ICD/PPM confirms afib on 2/10 and 2/11, mostly starting 2/10 at 11:54 pm and continuing to 5:03 am, 2/11. He states that he drinks alcohol nightly and he often takes his sotalol 14 hours apart. Otherwise, no change from his usual health. Denies snoring. He continues on sotalol.  Today, he denies symptoms of   chest pain, shortness of breath, orthopnea, PND, lower extremity edema, dizziness, presyncope, syncope, or neurologic sequela.  Positive for palpitations..The patient is tolerating medications without difficulties and is otherwise without complaint today.   Past Medical History:  Diagnosis Date  . Acute cholecystitis 06/21/2011  . Allergy    SEASONAL  . Cataract   . Complication of anesthesia    HALLUCINATIONS  . GERD (gastroesophageal reflux disease)   . Hearing loss   . Hiatal hernia   . Hypertrophic cardiomyopathy ([place])   . Mitral regurgitation    Moderate  . Paroxysmal atrial fibrillation ([place]) 04/2015   documented on ICD remote interrogation, treated with sotalol  . Septal defect    LV Septal hyptertrophy 42mm, normal EF, minimal LVOT gradiant.  . VT (ventricular tachycardia) ([place]) 08/18/2013   s/p St. Jude ICD   Past Surgical History:  Procedure Laterality Date  . APPENDECTOMY  1950  . CARDIOVERSION N/A 02/25/2017   Procedure: CARDIOVERSION;  Surgeon: Fay Records, MD;  Location: [facility];  Service: Cardiovascular;  Laterality: N/A;  . CHOLECYSTECTOMY  06/24/2011  . COLONOSCOPY    .  EP IMPLANTABLE DEVICE N/A 09/03/2016   SJM Fortify Assura Dr generator change by Dr Rayann Heman.  ICD implanted for secondary prevention of sudden death  . IMPLANTABLE CARDIOVERTER DEFIBRILLATOR IMPLANT  08/18/2013   St. Jude Fortify Assura DR ICD implanted by Dr Rayann Heman for secondary treatment of VT  . IMPLANTABLE CARDIOVERTER DEFIBRILLATOR IMPLANT N/A 08/18/2013   Procedure: IMPLANTABLE CARDIOVERTER DEFIBRILLATOR IMPLANT;  Surgeon: Coralyn Mark, MD;  Location: [facility];  Service: Cardiovascular;  Laterality: N/A;    Current Outpatient Medications  Medication Sig Dispense Refill  . acidophilus (RISAQUAD) CAPS capsule Take 1 capsule by mouth daily.    . Calcium Carb-Cholecalciferol (CALCIUM 600+D) 600-800 MG-UNIT TABS Take 1 tablet by mouth daily.    . Cholecalciferol (VITAMIN D3) 25 MCG (1000 UT) CAPS Take 1 capsule by mouth daily.    . Coenzyme Q10 (CO Q 10) 100 MG CAPS Take 100 mg by mouth daily.     Marland Kitchen ELIQUIS 5 MG TABS tablet TAKE 1 TABLET BY MOUTH TWICE DAILY 60 tablet 5  . glucosamine-chondroitin 500-400 MG tablet Take 1 tablet by mouth daily.    . pantoprazole (PROTONIX) 40 MG tablet Take 20 mg by mouth daily.     . sotalol (BETAPACE) 120 MG tablet TAKE 1 TABLET(120 MG) BY MOUTH TWICE DAILY 180 tablet 3  . Zinc 50 MG CAPS Take 1 capsule by mouth daily.     No current facility-administered medications for this encounter.     Allergies  Allergen Reactions  . Dextromethorphan Other (See Comments)    nervous  .  Versed [Midazolam] Other (See Comments)    "hallucinations, headache"  . Antihistamines, Diphenhydramine-Type Anxiety    Social History   Socioeconomic History  . Marital status: Married    Spouse name: Not on file  . Number of children: 2  . Years of education: Not on file  . Highest education level: Not on file  Occupational History  . Occupation: retired  Scientific laboratory technician  . Financial resource strain: Not on file  . Food insecurity:    Worry: Not on file     Inability: Not on file  . Transportation needs:    Medical: Not on file    Non-medical: Not on file  Tobacco Use  . Smoking status: Former Smoker    Packs/day: 2.00    Years: 20.00    Pack years: 40.00    Types: Cigarettes    Last attempt to quit: 07/06/1981    Years since quitting: 37.3  . Smokeless tobacco: Never Used  Substance and Sexual Activity  . Alcohol use: Yes    Alcohol/week: 0.0 standard drinks    Comment: 2 glasses per day  . Drug use: No  . Sexual activity: Not on file  Lifestyle  . Physical activity:    Days per week: Not on file    Minutes per session: Not on file  . Stress: Not on file  Relationships  . Social connections:    Talks on phone: Not on file    Gets together: Not on file    Attends religious service: Not on file    Active member of club or organization: Not on file    Attends meetings of clubs or organizations: Not on file    Relationship status: Not on file  . Intimate partner violence:    Fear of current or ex partner: Not on file    Emotionally abused: Not on file    Physically abused: Not on file    Forced sexual activity: Not on file  Other Topics Concern  . Not on file  Social History Narrative   Originally from [place]. Moved to [place] in 2012. Currently has no pets. No bird or hot tub exposure. 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 He did have exposure to Beneze, Acetone, Styrene, & Hydrocarbon early in his career.    Family History  Problem Relation Age of Onset  . Hypertrophic cardiomyopathy Mother   . Arrhythmia Mother   . Heart failure Mother   . Pneumonia Father   . Diabetes Father   . Colon polyps Father   . Heart attack Brother   . Hypertrophic cardiomyopathy Brother   . Heart Problems Brother        VAD  . Arrhythmia Brother   . Diabetes Brother   . Sudden death Brother   . Lung disease Neg Hx     ROS- All systems are reviewed and negative except as per the HPI  above  Physical Exam: Vitals:   10/26/18 0924  BP: 128/84  Pulse: (!) 56  Weight: 80.8 kg  Height: 5\' 10"  (1.778 m)   Wt Readings from Last 3 Encounters:  10/26/18 80.8 kg  10/13/18 80.6 kg  04/05/18 79.4 kg    Labs: Lab Results  Component Value Date   NA 141 10/13/2018   K 4.3 10/13/2018   CL 101 10/13/2018   CO2 25 10/13/2018   GLUCOSE 84 10/13/2018   BUN 16 10/13/2018   CREATININE 1.15 10/13/2018   CALCIUM 9.0  10/13/2018   MG 2.0 10/13/2018   No results found for: INR No results found for: CHOL, HDL, LDLCALC, TRIG   GEN- The patient is well appearing, alert and oriented x 3 today.   Head- normocephalic, atraumatic Eyes-  Sclera clear, conjunctiva pink Ears- hearing intact Oropharynx- clear Neck- supple, no JVP Lymph- no cervical lymphadenopathy Lungs- Clear to ausculation bilaterally, normal work of breathing Heart-regular rate and rhythm, no murmurs, rubs or gallops, PMI not laterally displaced GI- soft, NT, ND, + BS Extremities- no clubbing, cyanosis, or edema MS- no significant deformity or atrophy Skin- no rash or lesion Psych- euthymic mood, full affect Neuro- strength and sensation are intact  EKG- Sinus brady at 56 bpm with first degree AV Block. PR int 252 ms, qrs int 80 ms, qtc stable at 443 ms. Echo- 2018-------------------------------------------------------------------- Study Conclusions  - Left ventricle: The cavity size was normal. There was moderate   asymmetric hypertrophy of the septum. The appearance was   consistent with hypertrophic cardiomyopathy. Systolic function   was vigorous. The estimated ejection fraction was in the range of   65% to 70%. There was no dynamic obstruction. Wall motion was   normal; there were no regional wall motion abnormalities. - Mitral valve: There was mild to moderate regurgitation directed   centrally. - Left atrium: The atrium was severely dilated. - Right ventricle: The cavity size was mildly  dilated. - Right atrium: The atrium was mildly dilated. - Tricuspid valve: There was moderate regurgitation. - Pulmonary arteries: Systolic pressure was mildly increased. PA   peak pressure: 41 mm Hg (S).   Assessment and Plan: 1. Paroxysmal afib  Had afib 2/10-2/11 intermittently but overall afib  burden is low at 3.8 % Pt reassured that sotalol is still being effective to control afib, that it is hard not to have some afib, could discuss change to  tikosyn but he may still have the same afib burden He has very dilated L/R atrium so he is fortunate that his over all burden is low Because of this enlargement I do not think he is the best ablation candidate He was advised not to drink more than 2 glasses of alcohol a week and to take sotalol 12 hours apart instead of 14 hours apart to minimize afib episodes Continue Sotalol 120 mg bid, qtc stable Continue eliquis 5 mg bid  Labs checked and stable 1/30  2. HCM Stable  Per Dr. Rayann Heman  3. ICD Per Dr. Nathen May clinc    Geroge Baseman. Tarik Teixeira, [facility] [address] [place], [place] [address] [phone]

## 2018-11-22 DIAGNOSIS — Z6826 Body mass index (BMI) 26.0-26.9, adult: Secondary | ICD-10-CM | POA: Diagnosis not present

## 2018-11-22 DIAGNOSIS — H6123 Impacted cerumen, bilateral: Secondary | ICD-10-CM | POA: Diagnosis not present

## 2018-11-22 DIAGNOSIS — H9193 Unspecified hearing loss, bilateral: Secondary | ICD-10-CM | POA: Diagnosis not present

## 2018-11-29 DIAGNOSIS — H6121 Impacted cerumen, right ear: Secondary | ICD-10-CM | POA: Diagnosis not present

## 2018-11-29 DIAGNOSIS — J343 Hypertrophy of nasal turbinates: Secondary | ICD-10-CM | POA: Diagnosis not present

## 2018-11-29 DIAGNOSIS — Z974 Presence of external hearing-aid: Secondary | ICD-10-CM | POA: Diagnosis not present

## 2018-11-29 DIAGNOSIS — Z87891 Personal history of nicotine dependence: Secondary | ICD-10-CM | POA: Diagnosis not present

## 2018-11-29 DIAGNOSIS — H903 Sensorineural hearing loss, bilateral: Secondary | ICD-10-CM | POA: Diagnosis not present

## 2018-12-08 ENCOUNTER — Ambulatory Visit (INDEPENDENT_AMBULATORY_CARE_PROVIDER_SITE_OTHER): Payer: Medicare Other | Admitting: *Deleted

## 2018-12-08 ENCOUNTER — Other Ambulatory Visit: Payer: Self-pay

## 2018-12-08 DIAGNOSIS — I422 Other hypertrophic cardiomyopathy: Secondary | ICD-10-CM | POA: Diagnosis not present

## 2018-12-08 LAB — CUP PACEART REMOTE DEVICE CHECK
Battery Remaining Percentage: 77 %
Battery Voltage: 2.99 V
Brady Statistic AP VP Percent: 1.9 %
Brady Statistic AS VP Percent: 1 %
Brady Statistic AS VS Percent: 54 %
Brady Statistic RV Percent Paced: 2.2 %
HighPow Impedance: 50 Ohm
HighPow Impedance: 50 Ohm
Implantable Lead Implant Date: 20141205
Implantable Lead Location: 753860
Implantable Lead Model: 7121
Implantable Pulse Generator Implant Date: 20171221
Lead Channel Impedance Value: 450 Ohm
Lead Channel Impedance Value: 530 Ohm
Lead Channel Pacing Threshold Amplitude: 1 V
Lead Channel Pacing Threshold Pulse Width: 0.5 ms
Lead Channel Pacing Threshold Pulse Width: 0.5 ms
Lead Channel Sensing Intrinsic Amplitude: 12 mV
Lead Channel Sensing Intrinsic Amplitude: 5 mV
Lead Channel Setting Pacing Amplitude: 2.5 V
Lead Channel Setting Sensing Sensitivity: 0.5 mV
MDC IDC LEAD IMPLANT DT: 20141205
MDC IDC LEAD LOCATION: 753859
MDC IDC MSMT BATTERY REMAINING LONGEVITY: 77 mo
MDC IDC MSMT LEADCHNL RV PACING THRESHOLD AMPLITUDE: 0.5 V
MDC IDC PG SERIAL: 7387049
MDC IDC SESS DTM: 20200326060018
MDC IDC SET LEADCHNL RA PACING AMPLITUDE: 2 V
MDC IDC SET LEADCHNL RV PACING PULSEWIDTH: 0.5 ms
MDC IDC STAT BRADY AP VS PERCENT: 44 %
MDC IDC STAT BRADY RA PERCENT PACED: 44 %

## 2018-12-13 ENCOUNTER — Encounter: Payer: Self-pay | Admitting: Cardiology

## 2018-12-13 NOTE — Progress Notes (Signed)
Remote ICD transmission.   

## 2019-01-25 DIAGNOSIS — L821 Other seborrheic keratosis: Secondary | ICD-10-CM | POA: Diagnosis not present

## 2019-01-25 DIAGNOSIS — L812 Freckles: Secondary | ICD-10-CM | POA: Diagnosis not present

## 2019-01-25 DIAGNOSIS — D225 Melanocytic nevi of trunk: Secondary | ICD-10-CM | POA: Diagnosis not present

## 2019-03-09 ENCOUNTER — Ambulatory Visit (INDEPENDENT_AMBULATORY_CARE_PROVIDER_SITE_OTHER): Payer: Medicare Other | Admitting: *Deleted

## 2019-03-09 DIAGNOSIS — I422 Other hypertrophic cardiomyopathy: Secondary | ICD-10-CM | POA: Diagnosis not present

## 2019-03-09 LAB — CUP PACEART REMOTE DEVICE CHECK
Battery Remaining Longevity: 74 mo
Battery Remaining Percentage: 74 %
Battery Voltage: 2.99 V
Brady Statistic AP VP Percent: 2.8 %
Brady Statistic AP VS Percent: 47 %
Brady Statistic AS VP Percent: 1 %
Brady Statistic AS VS Percent: 49 %
Brady Statistic RA Percent Paced: 48 %
Brady Statistic RV Percent Paced: 3.2 %
Date Time Interrogation Session: 20200625060016
HighPow Impedance: 51 Ohm
HighPow Impedance: 51 Ohm
Implantable Lead Implant Date: 20141205
Implantable Lead Implant Date: 20141205
Implantable Lead Location: 753859
Implantable Lead Location: 753860
Implantable Lead Model: 7121
Implantable Pulse Generator Implant Date: 20171221
Lead Channel Impedance Value: 460 Ohm
Lead Channel Impedance Value: 540 Ohm
Lead Channel Pacing Threshold Amplitude: 0.5 V
Lead Channel Pacing Threshold Amplitude: 1 V
Lead Channel Pacing Threshold Pulse Width: 0.5 ms
Lead Channel Pacing Threshold Pulse Width: 0.5 ms
Lead Channel Sensing Intrinsic Amplitude: 12 mV
Lead Channel Sensing Intrinsic Amplitude: 4 mV
Lead Channel Setting Pacing Amplitude: 2 V
Lead Channel Setting Pacing Amplitude: 2.5 V
Lead Channel Setting Pacing Pulse Width: 0.5 ms
Lead Channel Setting Sensing Sensitivity: 0.5 mV
Pulse Gen Serial Number: 7387049

## 2019-03-16 ENCOUNTER — Other Ambulatory Visit: Payer: Self-pay | Admitting: Internal Medicine

## 2019-03-16 ENCOUNTER — Other Ambulatory Visit: Payer: Self-pay | Admitting: Physician Assistant

## 2019-03-16 NOTE — Telephone Encounter (Signed)
Eliquis 5mg  refill request received; pt is 73 yrs old, wt-80.6kg, Crea-1.15 on 10/13/2018, last seen by Roderic Palau on 10/26/2018; will send in refill to requested pharmacy.

## 2019-03-17 ENCOUNTER — Encounter: Payer: Self-pay | Admitting: Cardiology

## 2019-03-17 NOTE — Progress Notes (Signed)
Remote ICD transmission.   

## 2019-06-08 ENCOUNTER — Ambulatory Visit (INDEPENDENT_AMBULATORY_CARE_PROVIDER_SITE_OTHER): Payer: Medicare Other | Admitting: *Deleted

## 2019-06-08 ENCOUNTER — Telehealth: Payer: Self-pay

## 2019-06-08 DIAGNOSIS — I340 Nonrheumatic mitral (valve) insufficiency: Secondary | ICD-10-CM

## 2019-06-08 DIAGNOSIS — I422 Other hypertrophic cardiomyopathy: Secondary | ICD-10-CM

## 2019-06-08 DIAGNOSIS — I480 Paroxysmal atrial fibrillation: Secondary | ICD-10-CM

## 2019-06-08 LAB — CUP PACEART REMOTE DEVICE CHECK
Battery Remaining Longevity: 71 mo
Battery Remaining Percentage: 72 %
Battery Voltage: 2.98 V
Brady Statistic AP VP Percent: 3 %
Brady Statistic AP VS Percent: 47 %
Brady Statistic AS VP Percent: 1 %
Brady Statistic AS VS Percent: 48 %
Brady Statistic RA Percent Paced: 47 %
Brady Statistic RV Percent Paced: 3.7 %
Date Time Interrogation Session: 20200924060017
HighPow Impedance: 49 Ohm
HighPow Impedance: 50 Ohm
Implantable Lead Implant Date: 20141205
Implantable Lead Implant Date: 20141205
Implantable Lead Location: 753859
Implantable Lead Location: 753860
Implantable Lead Model: 7121
Implantable Pulse Generator Implant Date: 20171221
Lead Channel Impedance Value: 440 Ohm
Lead Channel Impedance Value: 510 Ohm
Lead Channel Pacing Threshold Amplitude: 0.5 V
Lead Channel Pacing Threshold Amplitude: 1 V
Lead Channel Pacing Threshold Pulse Width: 0.5 ms
Lead Channel Pacing Threshold Pulse Width: 0.5 ms
Lead Channel Sensing Intrinsic Amplitude: 12 mV
Lead Channel Sensing Intrinsic Amplitude: 3.8 mV
Lead Channel Setting Pacing Amplitude: 2 V
Lead Channel Setting Pacing Amplitude: 2.5 V
Lead Channel Setting Pacing Pulse Width: 0.5 ms
Lead Channel Setting Sensing Sensitivity: 0.5 mV
Pulse Gen Serial Number: 7387049

## 2019-06-08 NOTE — Telephone Encounter (Signed)
Spoke with pt regarding appt on 06/14/19. Pt stated he will check his vitals prior to his appt and did not have any questions at this time.

## 2019-06-14 ENCOUNTER — Telehealth (INDEPENDENT_AMBULATORY_CARE_PROVIDER_SITE_OTHER): Payer: Medicare Other | Admitting: Internal Medicine

## 2019-06-14 ENCOUNTER — Encounter: Payer: Self-pay | Admitting: Internal Medicine

## 2019-06-14 VITALS — BP 108/56 | HR 60 | Ht 70.0 in | Wt 175.0 lb

## 2019-06-14 DIAGNOSIS — I472 Ventricular tachycardia, unspecified: Secondary | ICD-10-CM

## 2019-06-14 DIAGNOSIS — I422 Other hypertrophic cardiomyopathy: Secondary | ICD-10-CM | POA: Diagnosis not present

## 2019-06-14 DIAGNOSIS — I340 Nonrheumatic mitral (valve) insufficiency: Secondary | ICD-10-CM | POA: Diagnosis not present

## 2019-06-14 DIAGNOSIS — I480 Paroxysmal atrial fibrillation: Secondary | ICD-10-CM | POA: Diagnosis not present

## 2019-06-14 NOTE — Progress Notes (Signed)
Electrophysiology TeleHealth Note   Due to national recommendations of social distancing due to COVID 19, an audio/video telehealth visit is felt to be most appropriate for this patient at this time.  See MyChart message from today for the patient's consent to telehealth for [facility].   Date:  06/14/2019   ID:  Joseph Morrow, DOB 1946-08-27, MRN [id number]  Location: patient's home  Provider location:  [place]  Evaluation Performed: Follow-up visit  PCP:  Marton Redwood, MD   Electrophysiologist:  Dr Rayann Heman  Chief Complaint:  palpitations  History of Present Illness:    Joseph Morrow is a 73 y.o. male who presents via telehealth conferencing today.  Since last being seen in our clinic, the patient reports doing very well.  Today, he denies symptoms of palpitations, chest pain, shortness of breath,  lower extremity edema, dizziness, presyncope, or syncope.  The patient is otherwise without complaint today.  The patient denies symptoms of fevers, chills, cough, or new SOB worrisome for COVID 19.  Past Medical History:  Diagnosis Date  . Acute cholecystitis 06/21/2011  . Allergy    SEASONAL  . Cataract   . Complication of anesthesia    HALLUCINATIONS  . GERD (gastroesophageal reflux disease)   . Hearing loss   . Hiatal hernia   . Hypertrophic cardiomyopathy ([place])   . Mitral regurgitation    Moderate  . Paroxysmal atrial fibrillation ([place]) 04/2015   documented on ICD remote interrogation, treated with sotalol  . Septal defect    LV Septal hyptertrophy 74mm, normal EF, minimal LVOT gradiant.  . VT (ventricular tachycardia) ([place]) 08/18/2013   s/p St. Jude ICD    Past Surgical History:  Procedure Laterality Date  . APPENDECTOMY  1950  . CARDIOVERSION N/A 02/25/2017   Procedure: CARDIOVERSION;  Surgeon: Fay Records, MD;  Location: [facility];  Service: Cardiovascular;  Laterality: N/A;  . CHOLECYSTECTOMY  06/24/2011  . COLONOSCOPY    . EP  IMPLANTABLE DEVICE N/A 09/03/2016   SJM Fortify Assura Dr generator change by Dr Rayann Heman.  ICD implanted for secondary prevention of sudden death  . IMPLANTABLE CARDIOVERTER DEFIBRILLATOR IMPLANT  08/18/2013   St. Jude Fortify Assura DR ICD implanted by Dr Rayann Heman for secondary treatment of VT  . IMPLANTABLE CARDIOVERTER DEFIBRILLATOR IMPLANT N/A 08/18/2013   Procedure: IMPLANTABLE CARDIOVERTER DEFIBRILLATOR IMPLANT;  Surgeon: Coralyn Mark, MD;  Location: [facility] CATH [facility];  Service: Cardiovascular;  Laterality: N/A;    Current Outpatient Medications  Medication Sig Dispense Refill  . acidophilus (RISAQUAD) CAPS capsule Take 1 capsule by mouth daily.    . Calcium Carb-Cholecalciferol (CALCIUM 600+D) 600-800 MG-UNIT TABS Take 1 tablet by mouth daily.    . Cholecalciferol (VITAMIN D3) 25 MCG (1000 UT) CAPS Take 1 capsule by mouth 2 (two) times daily.     . Coenzyme Q10 (CO Q 10) 100 MG CAPS Take 100 mg by mouth daily.     Marland Kitchen ELIQUIS 5 MG TABS tablet TAKE 1 TABLET BY MOUTH TWICE DAILY 60 tablet 6  . glucosamine-chondroitin 500-400 MG tablet Take 1 tablet by mouth daily.    . pantoprazole (PROTONIX) 40 MG tablet Take 20 mg by mouth daily.     . sotalol (BETAPACE) 120 MG tablet TAKE 1 TABLET(120 MG) BY MOUTH TWICE DAILY 180 tablet 1  . Zinc 50 MG CAPS Take 1 capsule by mouth 2 (two) times daily.      No current facility-administered medications for this visit.     Allergies:  Dextromethorphan; Versed [midazolam]; and Antihistamines, diphenhydramine-type   Social History:  The patient  reports that he quit smoking about 37 years ago. His smoking use included cigarettes. He has a 40.00 pack-year smoking history. He has never used smokeless tobacco. He reports current alcohol use. He reports that he does not use drugs.   Family History:  The patient's family history includes Arrhythmia in his brother and mother; Colon polyps in his father; Diabetes in his brother and father; Heart Problems in his  brother; Heart attack in his brother; Heart failure in his mother; Hypertrophic cardiomyopathy in his brother and mother; Pneumonia in his father; Sudden death in his brother.   ROS:  Please see the history of present illness.   All other systems are personally reviewed and negative.    Exam:    Vital Signs:  BP (!) 108/56   Pulse 60   Ht 5\' 10"  (1.778 m)   Wt 175 lb (79.4 kg)   BMI 25.11 kg/m   Well sounding and appearing, alert and conversant, regular work of breathing,  good skin color Eyes- anicteric, neuro- grossly intact, skin- no apparent rash or lesions or cyanosis, mouth- oral mucosa is pink  Labs/Other Tests and Data Reviewed:    Recent Labs: 10/13/2018: BUN 16; Creatinine, Ser 1.15; Hemoglobin 15.6; Magnesium 2.0; Platelets 196; Potassium 4.3; Sodium 141   Wt Readings from Last 3 Encounters:  06/14/19 175 lb (79.4 kg)  10/26/18 178 lb 3.2 oz (80.8 kg)  10/13/18 177 lb 9.6 oz (80.6 kg)     Last device remote is reviewed from [facility] PDF which reveals normal device function, no arrhythmias    ASSESSMENT & PLAN:    1.  HCM Remotes are uptodate Normal ICD function Clinically doing well  2. Paroxysmal atrial fibrillation afib burden is 1.8% (3.8% in February) Continue sotalol and eliquis Given severe biatrial enlargement, success with ablation may be reduced Obtain bmet, mg and ekg when he has his echo performed (below)  3. VT Well controlled with sotalol  4. Moderate MR Followed by Dr Marlou Porch Due for an echo  Follow-up:  Update echo, ekg and labs  (as above).  He wishes to have this done after the first of the year.  Continue remote monitoring Return to see EP NP in a year Follow-up with Dr Marlou Porch as scheduled   Patient Risk:  after full review of this patients clinical status, I feel that they are at moderate risk at this time.  Today, I have spent 15 minutes with the patient with telehealth technology discussing arrhythmia management .    Army Fossa, MD  06/14/2019 10:32 AM     [facility] [address] [place] [address] [phone] (office) [phone] (fax)

## 2019-06-14 NOTE — Telephone Encounter (Signed)
Order entered for ECHO and labs.  After ECHO is scheduled, will schedule nurse visit on same day for EKG and Pt will get labs.

## 2019-06-14 NOTE — Telephone Encounter (Signed)
-----   Message from [name], MD sent at 06/14/2019 10:43 AM EDT ----- Needs an echo to evaluate hypertrophic CM and mitral regurgitation  Also needs ekg, bmet, and mg on same day as echo. He wishes to have these done "after the first of the year"   Follow-up with Amber in a year

## 2019-06-15 ENCOUNTER — Encounter: Payer: Self-pay | Admitting: Cardiology

## 2019-06-15 NOTE — Progress Notes (Signed)
Remote ICD transmission.   

## 2019-06-30 DIAGNOSIS — M859 Disorder of bone density and structure, unspecified: Secondary | ICD-10-CM | POA: Diagnosis not present

## 2019-06-30 DIAGNOSIS — E781 Pure hyperglyceridemia: Secondary | ICD-10-CM | POA: Diagnosis not present

## 2019-06-30 DIAGNOSIS — Z125 Encounter for screening for malignant neoplasm of prostate: Secondary | ICD-10-CM | POA: Diagnosis not present

## 2019-06-30 DIAGNOSIS — Z23 Encounter for immunization: Secondary | ICD-10-CM | POA: Diagnosis not present

## 2019-06-30 DIAGNOSIS — R7301 Impaired fasting glucose: Secondary | ICD-10-CM | POA: Diagnosis not present

## 2019-07-05 DIAGNOSIS — R82998 Other abnormal findings in urine: Secondary | ICD-10-CM | POA: Diagnosis not present

## 2019-07-07 DIAGNOSIS — E781 Pure hyperglyceridemia: Secondary | ICD-10-CM | POA: Diagnosis not present

## 2019-07-07 DIAGNOSIS — I421 Obstructive hypertrophic cardiomyopathy: Secondary | ICD-10-CM | POA: Diagnosis not present

## 2019-07-07 DIAGNOSIS — M222X9 Patellofemoral disorders, unspecified knee: Secondary | ICD-10-CM | POA: Diagnosis not present

## 2019-07-07 DIAGNOSIS — I480 Paroxysmal atrial fibrillation: Secondary | ICD-10-CM | POA: Diagnosis not present

## 2019-07-07 DIAGNOSIS — Z1339 Encounter for screening examination for other mental health and behavioral disorders: Secondary | ICD-10-CM | POA: Diagnosis not present

## 2019-07-07 DIAGNOSIS — Z95810 Presence of automatic (implantable) cardiac defibrillator: Secondary | ICD-10-CM | POA: Diagnosis not present

## 2019-07-07 DIAGNOSIS — Z1331 Encounter for screening for depression: Secondary | ICD-10-CM | POA: Diagnosis not present

## 2019-07-07 DIAGNOSIS — R7301 Impaired fasting glucose: Secondary | ICD-10-CM | POA: Diagnosis not present

## 2019-07-07 DIAGNOSIS — Z23 Encounter for immunization: Secondary | ICD-10-CM | POA: Diagnosis not present

## 2019-07-07 DIAGNOSIS — Z7901 Long term (current) use of anticoagulants: Secondary | ICD-10-CM | POA: Diagnosis not present

## 2019-07-07 DIAGNOSIS — Z8679 Personal history of other diseases of the circulatory system: Secondary | ICD-10-CM | POA: Diagnosis not present

## 2019-07-07 DIAGNOSIS — K219 Gastro-esophageal reflux disease without esophagitis: Secondary | ICD-10-CM | POA: Diagnosis not present

## 2019-07-07 DIAGNOSIS — Z0000 Encounter for general adult medical examination without abnormal findings: Secondary | ICD-10-CM | POA: Diagnosis not present

## 2019-07-07 DIAGNOSIS — M8580 Other specified disorders of bone density and structure, unspecified site: Secondary | ICD-10-CM | POA: Diagnosis not present

## 2019-07-11 ENCOUNTER — Encounter: Payer: Self-pay | Admitting: Internal Medicine

## 2019-08-07 ENCOUNTER — Encounter: Payer: Self-pay | Admitting: *Deleted

## 2019-08-15 ENCOUNTER — Encounter: Payer: Self-pay | Admitting: Internal Medicine

## 2019-08-15 ENCOUNTER — Telehealth: Payer: Self-pay

## 2019-08-15 ENCOUNTER — Ambulatory Visit (INDEPENDENT_AMBULATORY_CARE_PROVIDER_SITE_OTHER): Payer: Medicare Other | Admitting: Internal Medicine

## 2019-08-15 VITALS — BP 100/70 | HR 68 | Temp 97.6°F | Ht 70.0 in | Wt 184.6 lb

## 2019-08-15 DIAGNOSIS — Z860100 Personal history of colon polyps, unspecified: Secondary | ICD-10-CM

## 2019-08-15 DIAGNOSIS — R141 Gas pain: Secondary | ICD-10-CM | POA: Diagnosis not present

## 2019-08-15 DIAGNOSIS — Z1159 Encounter for screening for other viral diseases: Secondary | ICD-10-CM

## 2019-08-15 DIAGNOSIS — Z7901 Long term (current) use of anticoagulants: Secondary | ICD-10-CM

## 2019-08-15 DIAGNOSIS — K219 Gastro-esophageal reflux disease without esophagitis: Secondary | ICD-10-CM | POA: Diagnosis not present

## 2019-08-15 MED ORDER — SUPREP BOWEL PREP KIT 17.5-3.13-1.6 GM/177ML PO SOLN: ORAL | 0 refills | Status: DC

## 2019-08-15 NOTE — Progress Notes (Signed)
Patient ID: Joseph Morrow, male   DOB: 11-Apr-1946, 73 y.o.   MRN: [id number] HPI: Joseph Morrow is a 73 year old male with a history of adenomatous colon polyps, H. pylori associated gastritis, hypertrophic cardiomyopathy, paroxysmal atrial fibrillation on Eliquis, history of VT, GERD who is here to discuss surveillance colonoscopy.  He is here alone today.  He is known to me from upper endoscopy and colonoscopy which I performed in October 2015.  Upper endoscopy revealed an irregular Z-line which was biopsied but negative for Barrett's.  A 3 cm hiatal hernia.  There was antral gastropathy which was found to be H. pylori gastritis which we treated.  Colonoscopy on the same day revealed 2 adenomatous polyps and moderate hemorrhoids.  He reports he is doing well.  He was having issues with abdominal gas and bloating.  He has been using Gas-X once a day and the symptoms have completely resolved.  Bowel movements occur 2-3 times per day without blood or melena.  No abdominal pain.  No change in bowel habit.  Reflux is well controlled on pantoprazole 20 mg daily.  No dysphagia or odynophagia.  Past Medical History:  Diagnosis Date  . Acute cholecystitis 06/21/2011  . Allergy    SEASONAL  . Cataract   . Complication of anesthesia    HALLUCINATIONS  . GERD (gastroesophageal reflux disease)   . Hearing loss   . Hemorrhoids   . Hiatal hernia   . Hypertrophic cardiomyopathy ([place])   . Mitral regurgitation    Moderate  . Paroxysmal atrial fibrillation ([place]) 04/2015   documented on ICD remote interrogation, treated with sotalol  . 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  . Tubular adenoma of colon   . VT (ventricular tachycardia) ([place]) 08/18/2013   s/p St. Jude ICD    Past Surgical History:  Procedure Laterality Date  . APPENDECTOMY  1950  . CARDIOVERSION N/A 02/25/2017   Procedure: CARDIOVERSION;  Surgeon: Fay Records, MD;  Location: [facility];  Service: Cardiovascular;  Laterality: N/A;  . CHOLECYSTECTOMY  06/24/2011  . COLONOSCOPY    . EP IMPLANTABLE DEVICE N/A 09/03/2016   SJM Fortify Assura Dr generator change by Dr Rayann Heman.  ICD implanted for secondary prevention of sudden death  . IMPLANTABLE CARDIOVERTER DEFIBRILLATOR IMPLANT  08/18/2013   St. Jude Fortify Assura DR ICD implanted by Dr Rayann Heman for secondary treatment of VT  . IMPLANTABLE CARDIOVERTER DEFIBRILLATOR IMPLANT N/A 08/18/2013   Procedure: IMPLANTABLE CARDIOVERTER DEFIBRILLATOR IMPLANT;  Surgeon: Coralyn Mark, MD;  Location: [facility];  Service: Cardiovascular;  Laterality: N/A;    Outpatient Medications Prior to Visit  Medication Sig Dispense Refill  . acidophilus (RISAQUAD) CAPS capsule Take 1 capsule by mouth daily.    . Calcium Carb-Cholecalciferol (CALCIUM 600+D) 600-800 MG-UNIT TABS Take 1 tablet by mouth daily.    . Cholecalciferol (VITAMIN D3) 25 MCG (1000 UT) CAPS Take 1 capsule by mouth 2 (two) times daily.     . Coenzyme Q10 (CO Q 10) 100 MG CAPS Take 100 mg by mouth daily.     Marland Kitchen ELIQUIS 5 MG TABS tablet TAKE 1 TABLET BY MOUTH TWICE DAILY 60 tablet 6  . glucosamine-chondroitin 500-400 MG tablet Take 1 tablet by mouth daily.    . pantoprazole (PROTONIX) 40 MG tablet Take 20 mg by mouth daily.     . sotalol (BETAPACE) 120 MG tablet TAKE 1 TABLET(120 MG) BY MOUTH TWICE DAILY 180 tablet 1  . Zinc  50 MG CAPS Take 1 capsule by mouth 2 (two) times daily.      No facility-administered medications prior to visit.     Allergies  Allergen Reactions  . Dextromethorphan Other (See Comments)    nervous  . Versed [Midazolam] Other (See Comments)    "hallucinations, headache"  . Antihistamines, Diphenhydramine-Type Anxiety    Family History  Problem Relation Age of Onset  . Hypertrophic cardiomyopathy Mother   . Arrhythmia Mother   . Heart failure Mother   . Pneumonia Father   . Diabetes Father   . Colon polyps Father   . Heart attack  Brother   . Hypertrophic cardiomyopathy Brother   . Heart Problems Brother        VAD  . Arrhythmia Brother   . Diabetes Brother   . Sudden death Brother   . Lung disease Neg Hx     Social History   Tobacco Use  . Smoking status: Former Smoker    Packs/day: 2.00    Years: 20.00    Pack years: 40.00    Types: Cigarettes    Quit date: 07/06/1981    Years since quitting: 38.1  . Smokeless tobacco: Never Used  Substance Use Topics  . Alcohol use: Yes    Alcohol/week: 0.0 standard drinks    Comment: 2 glasses per day  . Drug use: No    ROS: As per history of present illness, otherwise negative  BP 100/70   Pulse 68   Temp 97.6 F (36.4 C)   Ht 5\' 10"  (1.778 m)   Wt 184 lb 9.6 oz (83.7 kg)   BMI 26.49 kg/m  Gen: awake, alert, NAD HEENT: anicteric, op clear CV: RRR, no mrg Pulm: CTA b/l Abd: soft, NT/ND, +BS throughout Ext: no c/c/e Neuro: nonfocal   RELEVANT LABS AND IMAGING: CBC    Component Value Date/Time   WBC 5.5 10/13/2018 0852   WBC 6.1 09/03/2016 1404   RBC 5.23 10/13/2018 0852   RBC 4.98 09/03/2016 1404   HGB 15.6 10/13/2018 0852   HCT 46.2 10/13/2018 0852   PLT 196 10/13/2018 0852   MCV 88 10/13/2018 0852   MCH 29.8 10/13/2018 0852   MCH 30.7 09/03/2016 1404   MCHC 33.8 10/13/2018 0852   MCHC 34.6 09/03/2016 1404   RDW 12.7 10/13/2018 0852   LYMPHSABS 1.8 11/22/2015 0435   MONOABS 0.6 11/22/2015 0435   EOSABS 0.2 11/22/2015 0435   BASOSABS 0.0 11/22/2015 0435    CMP     Component Value Date/Time   NA 141 10/13/2018 0852   K 4.3 10/13/2018 0852   CL 101 10/13/2018 0852   CO2 25 10/13/2018 0852   GLUCOSE 84 10/13/2018 0852   GLUCOSE 89 04/05/2018 1101   BUN 16 10/13/2018 0852   CREATININE 1.15 10/13/2018 0852   CALCIUM 9.0 10/13/2018 0852   PROT 6.5 11/22/2015 0435   ALBUMIN 3.9 11/22/2015 0435   AST 26 11/22/2015 0435   ALT 24 11/22/2015 0435   ALKPHOS 74 11/22/2015 0435   BILITOT 0.6 11/22/2015 0435   GFRNONAA 63 10/13/2018  0852   GFRAA 73 10/13/2018 0852    ASSESSMENT/PLAN: 73 year old male with a history of adenomatous colon polyps, H. pylori associated gastritis, hypertrophic cardiomyopathy, paroxysmal atrial fibrillation on Eliquis, history of VT, GERD who is here to discuss surveillance colonoscopy.  1.  History of adenomatous colon polyps --surveillance colonoscopy recommended at this time.  We discussed the risk, benefits and alternatives and he is agreeable and wishes  to proceed.  We will need to hold Eliquis 48 hours before this procedure and we will reach out to Dr. Rayann Heman for his opinion.  Will hold Eliquis 2 days prior to endoscopic procedures - will instruct when and how to resume after procedure. Benefits and risks of procedure explained including risks of bleeding, perforation, infection, missed lesions, reactions to medications and possible need for hospitalization and surgery for complications. Additional rare but real risk of stroke or other vascular clotting events off Eliquis also explained and need to seek urgent help if any signs of these problems occur. Will communicate by phone or EMR with patient's  prescribing provider to confirm that holding Eliquis is reasonable in this case.   2.  GERD --well-controlled on low-dose pantoprazole.  Continue pantoprazole 20 mg a day  3.  Abdominal gas and bloating --symptoms improved with Gas-X, he can continue simethicone over-the-counter per box instruction as needed       [id number], Gwyndolyn Saxon, [place],  [place] [address]

## 2019-08-15 NOTE — Telephone Encounter (Signed)
Patient with diagnosis of afib on Eliquis for anticoagulation.    Procedure: COLONOSCOPY Date of procedure: 09/19/2019  CHADS2-VASc score of  2 (LV dysfunction, AGE)  CrCl 54 ml/min  Per office protocol, patient can hold Elqiuis for 2 days prior to procedure.

## 2019-08-15 NOTE — Telephone Encounter (Signed)
Pre Pre OP Team would like to have update echo before pt's procedure. I have sent message to echo scheduler. I will send note as to update [facility].

## 2019-08-15 NOTE — Telephone Encounter (Signed)
I will route to pharmacy for input on anticoagulation.   Preop call back- I noted that pt was to have echo in September to follow up on HOCM, but I do not see that it was done. Can  You try to schedule this.  Also he is overdue for follow up with Dr. Marlou Porch.

## 2019-08-15 NOTE — Telephone Encounter (Signed)
[facility] Pre-operative Risk Assessment     Request for surgical clearance:   Endoscopy Procedure  What type of surgery is being performed?     COLONOSCOPY  When is this surgery scheduled?   09-19-2019  What type of clearance is required ?   Pharmacy  Are there any medications that need to be held prior to surgery and how long? ELIQUIS 2 DAYS  Practice name and name of physician performing surgery?   [facility]  What is your office phone and fax number?      Phone- [phone]  Fax- [phone] Attn: Lemar Lofty, CMA  Anesthesia type (None, local, MAC, general) ?       MAC  Thank you!!

## 2019-08-15 NOTE — Patient Instructions (Signed)
If you are age 74 or older, your body mass index should be between 23-30. Your Body mass index is 26.49 kg/m. If this is out of the aforementioned range listed, please consider follow up with your Primary Care Provider.  If you are age 83 or younger, your body mass index should be between 19-25. Your Body mass index is 26.49 kg/m. If this is out of the aformentioned range listed, please consider follow up with your Primary Care Provider.   You have been scheduled for a colonoscopy. Please follow written instructions given to you at your visit today.  Please pick up your prep supplies at the pharmacy within the next 1-3 days. If you use inhalers (even only as needed), please bring them with you on the day of your procedure. Your physician has requested that you go to www.startemmi.com and enter the access code given to you at your visit today. This web site gives a general overview about your procedure. However, you should still follow specific instructions given to you by our office regarding your preparation for the procedure.  You will be contacted by our office prior to your procedure for directions on holding your Eliquis.  If you do not hear from our office 1 week prior to your scheduled procedure, please call [phone] to discuss.   Thank you for entrusting me with your care and for choosing [facility], Dr. Zenovia Jarred

## 2019-08-16 NOTE — Telephone Encounter (Signed)
Called and spoke to patient.  He understands to hold Eliquis starting on 09-16-19.

## 2019-08-16 NOTE — Telephone Encounter (Signed)
Echo to be done on 08/23/19.

## 2019-08-23 ENCOUNTER — Ambulatory Visit (HOSPITAL_COMMUNITY): Payer: Medicare Other | Attending: Internal Medicine

## 2019-08-23 ENCOUNTER — Other Ambulatory Visit: Payer: Self-pay

## 2019-08-23 DIAGNOSIS — I340 Nonrheumatic mitral (valve) insufficiency: Secondary | ICD-10-CM | POA: Diagnosis not present

## 2019-08-23 DIAGNOSIS — I422 Other hypertrophic cardiomyopathy: Secondary | ICD-10-CM | POA: Diagnosis not present

## 2019-08-24 NOTE — Telephone Encounter (Signed)
I have asked for Pre Op Provider today to review echo to see if pt can be cleared for his surgery.

## 2019-08-24 NOTE — Telephone Encounter (Signed)
I reviewed his echo. No significant LVOT noted.  He should be OK for endoscopy but I was unable to get in contact with him for clearance- "mailbox full"  Kerin Ransom PA-C 08/24/2019 1:49 PM

## 2019-08-29 ENCOUNTER — Telehealth: Payer: Self-pay | Admitting: Cardiology

## 2019-08-29 NOTE — Telephone Encounter (Signed)
   Primary Cardiologist: [name], MD  Chart reviewed as part of pre-operative protocol coverage. Given past medical history and time since last visit, based on ACC/AHA guidelines, Joseph Morrow would be at acceptable risk for the planned procedure without further cardiovascular testing.   Per pharmacy, patient with diagnosis of afib on Eliquis for anticoagulation.    Procedure: COLONOSCOPY Date of procedure: 09/19/2019  CHADS2-VASc score of  2 (LV dysfunction, AGE)  CrCl 54 ml/min  Per office protocol, patient can hold Elqiuis for 2 days prior to procedure.    Also underwent repeat echocardiogram 08/23/2019 with stable hypertrophic cardiomyopathy. Spoke with patient 12/15 and he is without symptoms of SOB, dizziness, or syncope. No chest pain.   I will route this recommendation to the requesting party via Epic fax function and remove from pre-op pool.  Please call with questions.  Kathyrn Drown, NP 08/29/2019, 9:49 AM

## 2019-08-29 NOTE — Telephone Encounter (Signed)
Per Kathyrn Drown, NP pt is cleared for his surgery, clearance notes have been sent to GI. I will remove from the pre op call back pool.

## 2019-09-02 DIAGNOSIS — Z20828 Contact with and (suspected) exposure to other viral communicable diseases: Secondary | ICD-10-CM | POA: Diagnosis not present

## 2019-09-07 ENCOUNTER — Ambulatory Visit (INDEPENDENT_AMBULATORY_CARE_PROVIDER_SITE_OTHER): Payer: Medicare Other | Admitting: *Deleted

## 2019-09-07 DIAGNOSIS — I472 Ventricular tachycardia, unspecified: Secondary | ICD-10-CM

## 2019-09-07 LAB — CUP PACEART REMOTE DEVICE CHECK
Battery Remaining Longevity: 69 mo
Battery Remaining Percentage: 69 %
Battery Voltage: 2.98 V
Brady Statistic AP VP Percent: 2.9 %
Brady Statistic AP VS Percent: 44 %
Brady Statistic AS VP Percent: 1 %
Brady Statistic AS VS Percent: 50 %
Brady Statistic RA Percent Paced: 44 %
Brady Statistic RV Percent Paced: 3.5 %
Date Time Interrogation Session: 20201224020018
HighPow Impedance: 48 Ohm
HighPow Impedance: 48 Ohm
Implantable Lead Implant Date: 20141205
Implantable Lead Implant Date: 20141205
Implantable Lead Location: 753859
Implantable Lead Location: 753860
Implantable Lead Model: 7121
Implantable Pulse Generator Implant Date: 20171221
Lead Channel Impedance Value: 450 Ohm
Lead Channel Impedance Value: 530 Ohm
Lead Channel Pacing Threshold Amplitude: 0.5 V
Lead Channel Pacing Threshold Amplitude: 1 V
Lead Channel Pacing Threshold Pulse Width: 0.5 ms
Lead Channel Pacing Threshold Pulse Width: 0.5 ms
Lead Channel Sensing Intrinsic Amplitude: 12 mV
Lead Channel Sensing Intrinsic Amplitude: 4.6 mV
Lead Channel Setting Pacing Amplitude: 2 V
Lead Channel Setting Pacing Amplitude: 2.5 V
Lead Channel Setting Pacing Pulse Width: 0.5 ms
Lead Channel Setting Sensing Sensitivity: 0.5 mV
Pulse Gen Serial Number: 7387049

## 2019-09-10 NOTE — Progress Notes (Signed)
ICD remote 

## 2019-09-18 ENCOUNTER — Other Ambulatory Visit: Payer: Self-pay | Admitting: Physician Assistant

## 2019-09-19 ENCOUNTER — Encounter: Payer: Medicare Other | Admitting: Internal Medicine

## 2019-09-28 ENCOUNTER — Ambulatory Visit (INDEPENDENT_AMBULATORY_CARE_PROVIDER_SITE_OTHER): Payer: Medicare Other

## 2019-09-28 ENCOUNTER — Other Ambulatory Visit: Payer: Self-pay | Admitting: Internal Medicine

## 2019-09-28 DIAGNOSIS — Z1159 Encounter for screening for other viral diseases: Secondary | ICD-10-CM | POA: Diagnosis not present

## 2019-09-28 LAB — SARS CORONAVIRUS 2 (TAT 6-24 HRS): SARS Coronavirus 2: NEGATIVE

## 2019-09-29 ENCOUNTER — Encounter: Payer: Self-pay | Admitting: Cardiology

## 2019-09-29 ENCOUNTER — Other Ambulatory Visit: Payer: Self-pay

## 2019-09-29 ENCOUNTER — Ambulatory Visit (INDEPENDENT_AMBULATORY_CARE_PROVIDER_SITE_OTHER): Payer: Medicare Other | Admitting: Cardiology

## 2019-09-29 VITALS — BP 96/64 | HR 56 | Ht 70.0 in | Wt 184.0 lb

## 2019-09-29 DIAGNOSIS — I4819 Other persistent atrial fibrillation: Secondary | ICD-10-CM | POA: Diagnosis not present

## 2019-09-29 DIAGNOSIS — I340 Nonrheumatic mitral (valve) insufficiency: Secondary | ICD-10-CM

## 2019-09-29 DIAGNOSIS — I422 Other hypertrophic cardiomyopathy: Secondary | ICD-10-CM | POA: Diagnosis not present

## 2019-09-29 DIAGNOSIS — I480 Paroxysmal atrial fibrillation: Secondary | ICD-10-CM

## 2019-09-29 DIAGNOSIS — I472 Ventricular tachycardia, unspecified: Secondary | ICD-10-CM

## 2019-09-29 DIAGNOSIS — Z95810 Presence of automatic (implantable) cardiac defibrillator: Secondary | ICD-10-CM

## 2019-09-29 NOTE — Patient Instructions (Signed)
Medication Instructions:  The current medical regimen is effective;  continue present plan and medications.  *If you need a refill on your cardiac medications before your next appointment, please call your pharmacy*  Follow-Up: At [facility], you and your health needs are our priority.  As part of our continuing mission to provide you with exceptional heart care, we have created designated Provider Care Teams.  These Care Teams include your primary Cardiologist (physician) and Advanced Practice Providers (APPs -  Physician Assistants and Nurse Practitioners) who all work together to provide you with the care you need, when you need it.  Your next appointment:   12 month(s)  The format for your next appointment:   In Person  Provider:   Mark Skains, MD   Thank you for choosing [facility]!!     

## 2019-09-29 NOTE — Progress Notes (Signed)
[facility]. [address], [address], [place]  [address] Phone: [phone] Fax:  [phone]  Date:  09/29/2019   ID:  Joseph Morrow, DOB Aug 25, 1946, MRN [id number]  PCP:  Marton Redwood, MD   History of Present Illness: Joseph Morrow is a 74 y.o. male who had ICD placed secondary to ventricular tachycardia heart rate of 210 beats per minute in the setting of hypertrophic cardiomyopathy with previous benign workup/evaluation including treadmill, monitoring. Had been on beta blocker. Overall he is doing well. No chest pain, no syncope. He did not have any syncope prior to defibrillator placement as well. Very rarely he feels orthostatic symptoms when standing up quickly. This is rare. He is walking, feeling well. Has gained a little bit of weight.  During a remote ICD monitoring check in November 2016 1 hour and 52 minutes of atrial fibrillation was detected.   He thinks that this episode may have coincided with an increase in alcohol/wine intake 1 evening. Otherwise, he is asymptomatic. He is walking well. Never had any prior stroke history. His brother does have atrial fibrillation. He understands correlation with stroke.  01/18/2018- overall doing well.  Reviewed Dr. Jackalyn Lombard note from 10/06/2017- no changes.  Sotalol working well, maintaining sinus rhythm, QT is stable.  Normal ICD function. Shoulder issue, PT. On Eliquis.  Feels great, no complaints.  09/29/2019 -Here for the follow-up of hypertrophic cardiomyopathy.  Overall been doing very well.  No ICD shocks.  Currently atrially paced 56 bpm.  Negative ventricular beats noted.  Excellent.  Feels well.  Blood pressure low normal upper 58N systolic but no symptoms.  Staying active.  No fevers chills nausea vomiting syncope bleeding.  Discussed Covid vaccine.   Wt Readings from Last 3 Encounters:  09/29/19 184 lb (83.5 kg)  08/15/19 184 lb 9.6 oz (83.7 kg)  06/14/19 175 lb (79.4 kg)     Past Medical History:  Diagnosis  Date  . Acute cholecystitis 06/21/2011  . Allergy    SEASONAL  . Cataract   . Complication of anesthesia    HALLUCINATIONS  . GERD (gastroesophageal reflux disease)   . Hearing loss   . Hemorrhoids   . Hiatal hernia   . Hypertrophic cardiomyopathy ([place])   . Mitral regurgitation    Moderate  . Paroxysmal atrial fibrillation ([place]) 04/2015   documented on ICD remote interrogation, treated with sotalol  . Septal defect    LV Septal hyptertrophy 65m, normal EF, minimal LVOT gradiant.  . Tubular adenoma of colon   . VT ([place]ricular tachycardia) ([place] 08/18/2013   s/p St. Jude ICD    Past Surgical History:  Procedure Laterality Date  . APPENDECTOMY  1950  . CARDIOVERSION N/A 02/25/2017   Procedure: CARDIOVERSION;  Surgeon: RFay Records MD;  [place] [facility]  Service: Cardiovascular;  Laterality: N/A;  . CHOLECYSTECTOMY  06/24/2011  . COLONOSCOPY    . EP IMPLANTABLE DEVICE N/A 09/03/2016   SJM Fortify [address] Dr generator change by Dr ARayann Heman  ICD implanted for secondary prevention of sudden death  . IMPLANTABLE CARDIOVERTER DEFIBRILLATOR IMPLANT  08/18/2013   St. Jude Fortify Assura DR ICD implanted by Dr ARayann Hemanfor secondary treatment of VT  . IMPLANTABLE CARDIOVERTER DEFIBRILLATOR IMPLANT N/A 08/18/2013   Procedure: IMPLANTABLE CARDIOVERTER DEFIBRILLATOR IMPLANT;  Surgeon: JCoralyn  MD;  [facility];  Service: Cardiovascular;  Laterality: N/A;    Current Outpatient Medications  Medication Sig Dispense Refill  . acidophilus (RISAQUAD) CAPS capsule Take  1 capsule by mouth daily.    . Calcium Carb-Cholecalciferol (CALCIUM 600+D) 600-800 MG-UNIT TABS Take 1 tablet by mouth daily.    . Cholecalciferol (VITAMIN D3) 25 MCG (1000 UT) CAPS Take 1 capsule by mouth 2 (two) times daily.     . Coenzyme Q10 (CO Q 10) 100 MG CAPS Take 100 mg by mouth daily.     Marland Kitchen ELIQUIS 5 MG TABS tablet TAKE 1 TABLET BY MOUTH TWICE DAILY 60 tablet 6  . glucosamine-chondroitin 500-400 MG  tablet Take 1 tablet by mouth daily.    . pantoprazole (PROTONIX) 40 MG tablet Take 20 mg by mouth daily.     . sotalol (BETAPACE) 120 MG tablet TAKE 1 TABLET(120 MG) BY MOUTH TWICE DAILY 180 tablet 2  . SUPREP BOWEL PREP KIT 17.5-3.13-1.6 GM/177ML SOLN Suprep-Use as directed 354 mL 0  . Zinc 50 MG CAPS Take 1 capsule by mouth 2 (two) times daily.      No current facility-administered medications for this visit.    Allergies:    Allergies  Allergen Reactions  . Dextromethorphan Other (See Comments)    nervous  . Versed [Midazolam] Other (See Comments)    "hallucinations, headache"  . Antihistamines, Diphenhydramine-Type Anxiety    Social History:  The patient  reports that he quit smoking about 38 years ago. His smoking use included cigarettes. He has a 40.00 pack-year smoking history. He has never used smokeless tobacco. He reports current alcohol use. He reports that he does not use drugs.   ROS:  Please see the history of present illness.   Denies any syncope, bleeding, orthopnea, PND  PHYSICAL EXAM: VS:  BP 96/64   Pulse (!) 56   Ht '5\' 10"'  (1.778 m)   Wt 184 lb (83.5 kg)   SpO2 96%   BMI 26.40 kg/m  GEN: Well nourished, well developed, in no acute distress  HEENT: normal  Neck: no JVD, carotid bruits, or masses Cardiac: RRR; no murmurs, rubs, or gallops,no edema  Respiratory:  clear to auscultation bilaterally, normal work of breathing GI: soft, nontender, nondistended, + BS MS: no deformity or atrophy  Skin: warm and dry, no rash Neuro:  Alert and Oriented x 3, Strength and sensation are intact Psych: euthymic mood, full affect    EKG:  Today atrial pacing 56 sinus bradycardia 284 ms PR interval nonspecific T wave changes.  08/13/15 - sinus rhythm, 60, PVC, old septal infarct pattern. Personally viewed-prior Sinus bradycardia rate 54, no pacing, normal     ASSESSMENT AND PLAN:  1. Paroxysmal atrial fibrillation- maintaining sinus rhythm with sotalol.  Very low  burden of atrial fibrillation on defibrillation checks.  CHADS-Vasc-2 (Age, hypertrophic cardiomyopathy). On Eliquis.  He is off aspirin.  Doing well, no bruising, in fact he says that 81 mg of aspirin caused more bruising than Eliquis. If HR goes up to 100 knows he is in AFIB. If takes deep breaths it goes away.  Stable.  Continuing with sotalol.  Outside labs reviewed, hemoglobin 16.4, creatinine 1.3 from October 2020. 2. Ventricular tachycardia-status post ICD, St. Jude early December 2014. Ventricular tachycardia heart rate of 210 initiated hospital stay.  Defibrillator. Answered questions. No defibrillations.  Stable feeling well.  Prior magnesium 2.0 from outside labs. 3. Hypertrophic cardiomyopathy-beta blocker, defibrillator, [facility] being [facility]. His son, Saralyn Pilar also has defibrillator, similar morphology to his heart.  No changes made. 4. Mild MR stable-echocardiogram reviewed.  No clinical changes.  No need to repeat echocardiogram. 5.  We will see back in12 months.  Signed, Candee Furbish, MD [facility]  09/29/2019 10:29 AM

## 2019-10-03 ENCOUNTER — Ambulatory Visit (AMBULATORY_SURGERY_CENTER): Payer: Medicare Other | Admitting: Internal Medicine

## 2019-10-03 ENCOUNTER — Encounter: Payer: Self-pay | Admitting: Internal Medicine

## 2019-10-03 ENCOUNTER — Other Ambulatory Visit: Payer: Self-pay

## 2019-10-03 VITALS — BP 110/50 | HR 53 | Temp 98.5°F | Resp 13 | Ht 70.0 in | Wt 184.0 lb

## 2019-10-03 DIAGNOSIS — D120 Benign neoplasm of cecum: Secondary | ICD-10-CM | POA: Diagnosis not present

## 2019-10-03 DIAGNOSIS — Z860100 Personal history of colon polyps, unspecified: Secondary | ICD-10-CM

## 2019-10-03 DIAGNOSIS — D124 Benign neoplasm of descending colon: Secondary | ICD-10-CM

## 2019-10-03 DIAGNOSIS — D122 Benign neoplasm of ascending colon: Secondary | ICD-10-CM | POA: Diagnosis not present

## 2019-10-03 MED ORDER — SODIUM CHLORIDE 0.9 % IV SOLN: 500.0000 mL | Freq: Once | INTRAVENOUS | Status: DC

## 2019-10-03 NOTE — Patient Instructions (Signed)
Please read handouts provided. Continue present medications. Await pathology results. Resume Eliquis ( apixaban ) at prior dose tomorrow.      YOU HAD AN ENDOSCOPIC PROCEDURE TODAY AT THE [facility]:   Refer to the procedure report that was given to you for any specific questions about what was found during the examination.  If the procedure report does not answer your questions, please call your gastroenterologist to clarify.  If you requested that your care partner not be given the details of your procedure findings, then the procedure report has been included in a sealed envelope for you to review at your convenience later.  YOU SHOULD EXPECT: Some feelings of bloating in the abdomen. Passage of more gas than usual.  Walking can help get rid of the air that was put into your GI tract during the procedure and reduce the bloating. If you had a lower endoscopy (such as a colonoscopy or flexible sigmoidoscopy) you may notice spotting of blood in your stool or on the toilet paper. If you underwent a bowel prep for your procedure, you may not have a normal bowel movement for a few days.  Please Note:  You might notice some irritation and congestion in your nose or some drainage.  This is from the oxygen used during your procedure.  There is no need for concern and it should clear up in a day or so.  SYMPTOMS TO REPORT IMMEDIATELY:   Following lower endoscopy (colonoscopy or flexible sigmoidoscopy):  Excessive amounts of blood in the stool  Significant tenderness or worsening of abdominal pains  Swelling of the abdomen that is new, acute  Fever of 100F or higher    For urgent or emergent issues, a gastroenterologist can be reached at any hour by calling [phone].   DIET:  We do recommend a small meal at first, but then you may proceed to your regular diet.  Drink plenty of fluids but you should avoid alcoholic beverages for 24 hours.  ACTIVITY:  You should plan to take  it easy for the rest of today and you should NOT DRIVE or use heavy machinery until tomorrow (because of the sedation medicines used during the test).    FOLLOW UP: Our staff will call the number listed on your records 48-72 hours following your procedure to check on you and address any questions or concerns that you may have regarding the information given to you following your procedure. If we do not reach you, we will leave a message.  We will attempt to reach you two times.  During this call, we will ask if you have developed any symptoms of COVID 19. If you develop any symptoms (ie: fever, flu-like symptoms, shortness of breath, cough etc.) before then, please call [phone].  If you test positive for Covid 19 in the 2 weeks post procedure, please call and report this information to us.    If any biopsies were taken you will be contacted by phone or by letter within the next 1-3 weeks.  Please call us at [phone] if you have not heard about the biopsies in 3 weeks.    SIGNATURES/CONFIDENTIALITY: You and/or your care partner have signed paperwork which will be entered into your electronic medical record.  These signatures attest to the fact that that the information above on your After Visit Summary has been reviewed and is understood.  Full responsibility of the confidentiality of this discharge information lies with you and/or your care-partner. 

## 2019-10-03 NOTE — Progress Notes (Signed)
PT taken to PACU. Monitors in place. VSS. Report given to RN. 

## 2019-10-03 NOTE — Progress Notes (Signed)
Called to room to assist during endoscopic procedure.  Patient ID and intended procedure confirmed with present staff. Received instructions for my participation in the procedure from the performing physician.  

## 2019-10-03 NOTE — Op Note (Signed)
[facility] Patient Name: Joseph Morrow Procedure Date: 10/03/2019 1:38 PM MRN: [id number] Endoscopist: Jerene Bears , MD Age: 74 Referring MD:  Date of Birth: August 13, 1946 Gender: Male Account #: [id number] Procedure:                Colonoscopy Indications:              High risk colon cancer surveillance: Personal                            history of non-advanced adenoma, Last colonoscopy:                            October 2015 Medicines:                Monitored Anesthesia Care Procedure:                Pre-Anesthesia Assessment:                           - Prior to the procedure, a History and Physical                            was performed, and patient medications and                            allergies were reviewed. The patient's tolerance of                            previous anesthesia was also reviewed. The risks                            and benefits of the procedure and the sedation                            options and risks were discussed with the patient.                            All questions were answered, and informed consent                            was obtained. Prior Anticoagulants: The patient has                            taken Eliquis (apixaban), last dose was 2 days                            prior to procedure. After reviewing the risks and                            benefits, the patient was deemed in satisfactory                            condition to undergo the procedure.  After obtaining informed consent, the colonoscope                            was passed under direct vision. Throughout the                            procedure, the patient's blood pressure, pulse, and                            oxygen saturations were monitored continuously. The                            Colonoscope was introduced through the anus and                            advanced to the cecum, identified by appendiceal                        orifice and ileocecal valve. The colonoscopy was                            performed without difficulty. The patient tolerated                            the procedure well. The quality of the bowel                            preparation was good. The ileocecal valve,                            appendiceal orifice, and rectum were photographed. Scope In: 1:40:42 PM Scope Out: 1:57:39 PM Scope Withdrawal Time: 0 hours 11 minutes 44 seconds  Total Procedure Duration: 0 hours 16 minutes 57 seconds  Findings:                 Skin tags were found on perianal exam.                           A 1 mm polyp was found in the cecum. The polyp was                            sessile. The polyp was removed with a cold snare.                            Resection and retrieval were complete.                           A 9 mm polyp was found in the ascending colon. The                            polyp was sessile. The polyp was removed with a                            cold snare. Resection  and retrieval were complete.                           Four sessile polyps were found in the descending                            colon. The polyps were 3 to 5 mm in size. These                            polyps were removed with a cold snare. Resection                            and retrieval were complete.                           Internal hemorrhoids were found during retroflexion. Complications:            No immediate complications. Estimated Blood Loss:     Estimated blood loss was minimal. Impression:               - One 1 mm polyp in the cecum, removed with a cold                            snare. Resected and retrieved.                           - One 9 mm polyp in the ascending colon, removed                            with a cold snare. Resected and retrieved.                           - Four 3 to 5 mm polyps in the descending colon,                            removed with a cold snare.  Resected and retrieved.                           - Internal hemorrhoids. Recommendation:           - Patient has a contact number available for                            emergencies. The signs and symptoms of potential                            delayed complications were discussed with the                            patient. Return to normal activities tomorrow.                            Written discharge instructions were provided to the  patient.                           - Resume previous diet.                           - Continue present medications.                           - Await pathology results.                           - Repeat colonoscopy is recommended for                            surveillance. The colonoscopy date will be                            determined after pathology results from today's                            exam become available for review.                           - Resume Eliquis (apixaban) at prior dose tomorrow.                            Refer to managing physician for further adjustment                            of therapy. Jerene Bears, MD 10/03/2019 2:03:49 PM This report has been signed electronically.

## 2019-10-04 ENCOUNTER — Ambulatory Visit: Payer: Medicare Other | Attending: Internal Medicine

## 2019-10-04 ENCOUNTER — Telehealth: Payer: Self-pay | Admitting: Internal Medicine

## 2019-10-04 DIAGNOSIS — Z23 Encounter for immunization: Secondary | ICD-10-CM | POA: Diagnosis not present

## 2019-10-04 NOTE — Telephone Encounter (Signed)
New Message  We are recommending the COVID-19 vaccine to all of our patients. Cardiac medications (including blood thinners) should not deter anyone from being vaccinated and there is no need to hold any of those medications prior to vaccine administration.     Currently, there is a hotline to call (active 09/22/19) to schedule vaccination appointments as no walk-ins will be accepted.   Number: [phone].    If an appointment is not available please go to [email or web address] to sign up for notification when additional vaccine appointments are available.   If you have further questions or concerns about the vaccine process, please visit [email or web address] or contact your primary care physician.   

## 2019-10-04 NOTE — Progress Notes (Signed)
   Covid-19 Vaccination Clinic  Name:  Ko Krider    MRN: [id number] DOB: 1946-04-20  10/04/2019  Mr. Vedder was observed post Covid-19 immunization for 15 minutes without incidence. He was provided with Vaccine Information Sheet and instruction to access the V-Safe system.   Mr. Las was instructed to call 911 with any severe reactions post vaccine: Marland Kitchen Difficulty breathing  . Swelling of your face and throat  . A fast heartbeat  . A bad rash all over your body  . Dizziness and weakness    Immunizations Administered    Name Date Dose VIS Date Route   Pfizer COVID-19 Vaccine 10/04/2019  4:39 PM 0.3 mL 08/25/2019 Intramuscular   Manufacturer: [place]   Lot: [id number]   [place]: [id number]

## 2019-10-05 ENCOUNTER — Telehealth: Payer: Self-pay

## 2019-10-05 NOTE — Telephone Encounter (Signed)
  Follow up Call-  Call back number 10/03/2019  Post procedure Call Back phone  # [phone]  Permission to leave phone message Yes  Some recent data might be hidden     Patient questions:  Do you have a fever, pain , or abdominal swelling? No. Pain Score  0 *  Have you tolerated food without any problems? Yes.    Have you been able to return to your normal activities? Yes.    Do you have any questions about your discharge instructions: Diet   No. Medications  No. Follow up visit  No.  Do you have questions or concerns about your Care? No.  Actions: * If pain score is 4 or above: No action needed, pain <4.  1. Have you developed a fever since your procedure? no  2.   Have you had an respiratory symptoms (SOB or cough) since your procedure? no  3.   Have you tested positive for COVID 19 since your procedure no  4.   Have you had any family members/close contacts diagnosed with the COVID 19 since your procedure?  no   If yes to any of these questions please route to Joylene John, RN and Alphonsa Gin, Therapist, sports.

## 2019-10-09 ENCOUNTER — Encounter: Payer: Self-pay | Admitting: Internal Medicine

## 2019-10-18 ENCOUNTER — Other Ambulatory Visit: Payer: Self-pay | Admitting: Internal Medicine

## 2019-10-18 NOTE — Telephone Encounter (Signed)
Pt last saw Dr Marlou Porch 09/29/19, last labs 06/30/19 Creat 1.3 at [facility] per [place], age 74, weight 83.5kg, based on specified criteria pt is on appropriate dosage of Eliquis 5mg  BID.  Will refill rx.

## 2019-10-25 ENCOUNTER — Ambulatory Visit: Payer: Medicare Other | Attending: Internal Medicine

## 2019-10-25 DIAGNOSIS — Z23 Encounter for immunization: Secondary | ICD-10-CM | POA: Insufficient documentation

## 2019-10-25 NOTE — Progress Notes (Signed)
   Covid-19 Vaccination Clinic  Name:  Joseph Morrow    MRN: [id number] DOB: Aug 07, 1946  10/25/2019  [name] was observed post Covid-19 immunization for 15 minutes without incidence. He was provided with Vaccine Information Sheet and instruction to access the V-Safe system.   [name] was instructed to call 911 with any severe reactions post vaccine: Marland Kitchen Difficulty breathing  . Swelling of your face and throat  . A fast heartbeat  . A bad rash all over your body  . Dizziness and weakness    Immunizations Administered    Name Date Dose VIS Date Route   Pfizer COVID-19 Vaccine 10/25/2019  8:15 AM 0.3 mL 08/25/2019 Intramuscular   Manufacturer: [employer]   Lot: [id number]   [place]: [id number]

## 2019-11-28 DIAGNOSIS — H6123 Impacted cerumen, bilateral: Secondary | ICD-10-CM | POA: Diagnosis not present

## 2019-12-07 ENCOUNTER — Ambulatory Visit (INDEPENDENT_AMBULATORY_CARE_PROVIDER_SITE_OTHER): Payer: Medicare Other | Admitting: *Deleted

## 2019-12-07 DIAGNOSIS — I472 Ventricular tachycardia, unspecified: Secondary | ICD-10-CM

## 2019-12-07 LAB — CUP PACEART REMOTE DEVICE CHECK
Battery Remaining Longevity: 68 mo
Battery Remaining Percentage: 68 %
Battery Voltage: 2.98 V
Brady Statistic AP VP Percent: 2.7 %
Brady Statistic AP VS Percent: 43 %
Brady Statistic AS VP Percent: 1 %
Brady Statistic AS VS Percent: 52 %
Brady Statistic RA Percent Paced: 43 %
Brady Statistic RV Percent Paced: 3.2 %
Date Time Interrogation Session: 20210325020018
HighPow Impedance: 51 Ohm
HighPow Impedance: 51 Ohm
Implantable Lead Implant Date: 20141205
Implantable Lead Implant Date: 20141205
Implantable Lead Location: 753859
Implantable Lead Location: 753860
Implantable Lead Model: 7121
Implantable Pulse Generator Implant Date: 20171221
Lead Channel Impedance Value: 440 Ohm
Lead Channel Impedance Value: 510 Ohm
Lead Channel Pacing Threshold Amplitude: 0.5 V
Lead Channel Pacing Threshold Amplitude: 1 V
Lead Channel Pacing Threshold Pulse Width: 0.5 ms
Lead Channel Pacing Threshold Pulse Width: 0.5 ms
Lead Channel Sensing Intrinsic Amplitude: 12 mV
Lead Channel Sensing Intrinsic Amplitude: 4.1 mV
Lead Channel Setting Pacing Amplitude: 2 V
Lead Channel Setting Pacing Amplitude: 2.5 V
Lead Channel Setting Pacing Pulse Width: 0.5 ms
Lead Channel Setting Sensing Sensitivity: 0.5 mV
Pulse Gen Serial Number: 7387049

## 2019-12-07 NOTE — Progress Notes (Signed)
ICD Remote  

## 2019-12-14 DIAGNOSIS — M25562 Pain in left knee: Secondary | ICD-10-CM | POA: Diagnosis not present

## 2020-01-09 DIAGNOSIS — H903 Sensorineural hearing loss, bilateral: Secondary | ICD-10-CM | POA: Diagnosis not present

## 2020-02-13 ENCOUNTER — Telehealth: Payer: Self-pay

## 2020-02-13 NOTE — Telephone Encounter (Signed)
Called and spoke with patient.  He is aware of appt 02/14/20 @3  pm with Roderic Palau, NP.

## 2020-02-13 NOTE — Telephone Encounter (Signed)
[facility]lin alert received- Ongoing AF. Incvreased AF burden.  Pt with hx of known PAF on Sotalol and eliquis.  Onset of current episode 02/11/20 1217.    Spoke with pt, states he has felt "off"  He reports a slight pain in chest especially with activity.   He is currently at work, lifting heavy boxes.   Pt indicates compliance with meds as ordered.  Advised pt need to take it easy and try to get HR back into rhythm, will lforward message to AF clinic as he has history of care there.

## 2020-02-13 NOTE — Telephone Encounter (Signed)
[facility] transmission received. Has AF clinic 02/14/20 @ 3:00 PM.

## 2020-02-14 ENCOUNTER — Ambulatory Visit (HOSPITAL_COMMUNITY)
Admission: RE | Admit: 2020-02-14 | Discharge: 2020-02-14 | Disposition: A | Discharge status: Home / Self Care | Payer: Medicare Other | Source: Ambulatory Visit | Attending: Nurse Practitioner | Admitting: Nurse Practitioner

## 2020-02-14 ENCOUNTER — Other Ambulatory Visit: Payer: Self-pay

## 2020-02-14 ENCOUNTER — Encounter (HOSPITAL_COMMUNITY): Payer: Self-pay | Admitting: Nurse Practitioner

## 2020-02-14 VITALS — BP 122/82 | HR 72 | Ht 70.0 in | Wt 181.6 lb

## 2020-02-14 DIAGNOSIS — Z7901 Long term (current) use of anticoagulants: Secondary | ICD-10-CM | POA: Insufficient documentation

## 2020-02-14 DIAGNOSIS — Z79899 Other long term (current) drug therapy: Secondary | ICD-10-CM | POA: Insufficient documentation

## 2020-02-14 DIAGNOSIS — Z87891 Personal history of nicotine dependence: Secondary | ICD-10-CM | POA: Diagnosis not present

## 2020-02-14 DIAGNOSIS — Z8249 Family history of ischemic heart disease and other diseases of the circulatory system: Secondary | ICD-10-CM | POA: Diagnosis not present

## 2020-02-14 DIAGNOSIS — I472 Ventricular tachycardia: Secondary | ICD-10-CM | POA: Diagnosis not present

## 2020-02-14 DIAGNOSIS — D6869 Other thrombophilia: Secondary | ICD-10-CM

## 2020-02-14 DIAGNOSIS — Z888 Allergy status to other drugs, medicaments and biological substances status: Secondary | ICD-10-CM | POA: Diagnosis not present

## 2020-02-14 DIAGNOSIS — I4892 Unspecified atrial flutter: Secondary | ICD-10-CM | POA: Diagnosis not present

## 2020-02-14 DIAGNOSIS — I484 Atypical atrial flutter: Secondary | ICD-10-CM

## 2020-02-14 DIAGNOSIS — I422 Other hypertrophic cardiomyopathy: Secondary | ICD-10-CM | POA: Insufficient documentation

## 2020-02-14 DIAGNOSIS — I480 Paroxysmal atrial fibrillation: Secondary | ICD-10-CM | POA: Insufficient documentation

## 2020-02-14 DIAGNOSIS — K219 Gastro-esophageal reflux disease without esophagitis: Secondary | ICD-10-CM | POA: Insufficient documentation

## 2020-02-14 DIAGNOSIS — Z95810 Presence of automatic (implantable) cardiac defibrillator: Secondary | ICD-10-CM | POA: Diagnosis not present

## 2020-02-15 ENCOUNTER — Telehealth (HOSPITAL_COMMUNITY): Payer: Self-pay | Admitting: *Deleted

## 2020-02-15 ENCOUNTER — Encounter (HOSPITAL_COMMUNITY): Payer: Self-pay | Admitting: Nurse Practitioner

## 2020-02-15 ENCOUNTER — Other Ambulatory Visit (HOSPITAL_COMMUNITY): Payer: Self-pay | Admitting: Nurse Practitioner

## 2020-02-15 MED ORDER — METOPROLOL SUCCINATE ER 25 MG PO TB24
12.5000 mg | ORAL_TABLET | Freq: Every day | ORAL | 1 refills | Status: DC
Start: 2020-02-15 — End: 2020-02-15

## 2020-02-15 NOTE — Telephone Encounter (Signed)
Pt notified to add 12.mg of metoprolol at bedtime and send transmission on Monday.

## 2020-02-15 NOTE — Progress Notes (Signed)
Primary Care Physician: Marton Redwood, MD Referring [place] [place] Triage EP: Dr. Everlean Patterson Obier is a 74 y.o. male with a h/o hypertrophic cardiomyopathy, s/p ICD for VT, remote paroxysmal afib, maintaining SR with a very small afib burden on Sotalol. Hospitalized for sotalol administration 02/2017. Chadsvasc score of 2(age, LV dysfunction), continues on eliquis 5 mg bid.  Qtc stable.   He asked to be seen today for afib noted yesterday. Interrogation of ICD/PPM confirms afib on 2/10 and 2/11, mostly starting 2/10 at 11:54 pm and continuing to 5:03 am, 2/11. He states that he drinks alcohol nightly and he often takes his sotalol 14 hours apart. Otherwise, no change from his usual health. Denies snoring. He continues on sotalol.  F/u in afib clinic, 6/2. I am seeing him as the device clinic noted onset of atrial flutter, rate controlled since  5/30. In the office he continues in rate controlled aflutter. He is mildly symptomatic with this. He continues on sotalol 120 mg bid, and is compliant. He has 2 alcoholic drinks a week. He had been on BB in the past . Stopped when sotalol loaded. Continues on eliquis 5 mg bid for a CHA2DS2VASc score of 3.   Today, he denies symptoms of   chest pain, shortness of breath, orthopnea, PND, lower extremity edema, dizziness, presyncope, syncope, or neurologic sequela.  Positive for palpitations..The patient is tolerating medications without difficulties and is otherwise without complaint today.   Past Medical History:  Diagnosis Date  . Acute cholecystitis 06/21/2011  . Allergy    SEASONAL  . Cataract   . Complication of anesthesia    HALLUCINATIONS  . GERD (gastroesophageal reflux disease)   . Hearing loss   . Hemorrhoids   . Hiatal hernia   . Hypertrophic cardiomyopathy ([place])   . Mitral regurgitation    Moderate  . Paroxysmal atrial fibrillation ([place]) 04/2015   documented on ICD remote interrogation, treated with sotalol    . Septal defect    LV Septal hyptertrophy 73mm, normal EF, minimal LVOT gradiant.  . Tubular adenoma of colon   . VT (ventricular tachycardia) ([place]) 08/18/2013   s/p St. Jude ICD   Past Surgical History:  Procedure Laterality Date  . APPENDECTOMY  1950  . CARDIOVERSION N/A 02/25/2017   Procedure: CARDIOVERSION;  Surgeon: Fay Records, MD;  Location: [facility];  Service: Cardiovascular;  Laterality: N/A;  . CHOLECYSTECTOMY  06/24/2011  . COLONOSCOPY    . EP IMPLANTABLE DEVICE N/A 09/03/2016   SJM Fortify Assura Dr generator change by Dr Rayann Heman.  ICD implanted for secondary prevention of sudden death  . IMPLANTABLE CARDIOVERTER DEFIBRILLATOR IMPLANT  08/18/2013   St. Jude Fortify Assura DR ICD implanted by Dr Rayann Heman for secondary treatment of VT  . IMPLANTABLE CARDIOVERTER DEFIBRILLATOR IMPLANT N/A 08/18/2013   Procedure: IMPLANTABLE CARDIOVERTER DEFIBRILLATOR IMPLANT;  Surgeon: Coralyn Mark, MD;  Location: [facility] CATH [facility];  Service: Cardiovascular;  Laterality: N/A;    Current Outpatient Medications  Medication Sig Dispense Refill  . acidophilus (RISAQUAD) CAPS capsule Take 1 capsule by mouth daily.    . Calcium Carb-Cholecalciferol (CALCIUM 600+D) 600-800 MG-UNIT TABS Take 1 tablet by mouth daily.    . Cholecalciferol (VITAMIN D3) 25 MCG (1000 UT) CAPS Take 1 capsule by mouth 2 (two) times daily.     . Coenzyme Q10 (CO Q 10) 100 MG CAPS Take 100 mg by mouth daily.     Marland Kitchen ELIQUIS 5 MG TABS tablet TAKE 1 TABLET  BY MOUTH TWICE DAILY 60 tablet 6  . glucosamine-chondroitin 500-400 MG tablet Take 1 tablet by mouth daily.    . pantoprazole (PROTONIX) 40 MG tablet Take 20 mg by mouth daily.     . sotalol (BETAPACE) 120 MG tablet TAKE 1 TABLET(120 MG) BY MOUTH TWICE DAILY 180 tablet 2  . Zinc 50 MG CAPS Take 1 capsule by mouth 2 (two) times daily.      No current facility-administered medications for this encounter.    Allergies  Allergen Reactions  . Dextromethorphan Other (See  Comments)    nervous  . Versed [Midazolam] Other (See Comments)    "hallucinations, headache"  . Antihistamines, Diphenhydramine-Type Anxiety    Social History   Socioeconomic History  . Marital status: Married    Spouse name: Not on file  . Number of children: 2  . Years of education: Not on file  . Highest education level: Not on file  Occupational History  . Occupation: retired  Tobacco Use  . Smoking status: Former Smoker    Packs/day: 2.00    Years: 20.00    Pack years: 40.00    Types: Cigarettes    Quit date: 07/06/1981    Years since quitting: 38.6  . Smokeless tobacco: Never Used  Substance and Sexual Activity  . Alcohol use: Yes    Alcohol/week: 0.0 standard drinks    Comment: 2 glasses per day  . Drug use: No  . Sexual activity: Not on file  Other Topics Concern  . Not on file  Social History Narrative   Originally from [place]. Moved to [place] in 2012. Currently has no pets. No bird or hot tub exposure. 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 He did have exposure to Beneze, Acetone, Styrene, & Hydrocarbon early in his career.   Social Determinants of Health   Financial Resource Strain:   . Difficulty of Paying Living Expenses:   Food Insecurity:   . Worried About Charity fundraiser in the Last Year:   . Arboriculturist in the Last Year:   Transportation Needs:   . Film/video editor (Medical):   Marland Kitchen Lack of Transportation (Non-Medical):   Physical Activity:   . Days of Exercise per Week:   . Minutes of Exercise per Session:   Stress:   . Feeling of Stress :   Social Connections:   . Frequency of Communication with Friends and Family:   . Frequency of Social Gatherings with Friends and Family:   . Attends Religious Services:   . Active Member of Clubs or Organizations:   . Attends Archivist Meetings:   Marland Kitchen Marital Status:   Intimate Partner Violence:   . Fear of Current or Ex-Partner:   . Emotionally  Abused:   Marland Kitchen Physically Abused:   . Sexually Abused:     Family History  Problem Relation Age of Onset  . Hypertrophic cardiomyopathy Mother   . Arrhythmia Mother   . Heart failure Mother   . Pneumonia Father   . Diabetes Father   . Colon polyps Father   . Heart attack Brother   . Hypertrophic cardiomyopathy Brother   . Heart Problems Brother        VAD  . Arrhythmia Brother   . Diabetes Brother   . Sudden death Brother   . Lung disease Neg Hx     ROS- All systems are reviewed and negative except as per the HPI above  Physical Exam: Vitals:   02/14/20 1517  BP: 122/82  Pulse: 72  Weight: 82.4 kg  Height: 5\' 10"  (1.778 m)   Wt Readings from Last 3 Encounters:  02/14/20 82.4 kg  10/03/19 83.5 kg  09/29/19 83.5 kg    Labs: Lab Results  Component Value Date   NA 141 10/13/2018   K 4.3 10/13/2018   CL 101 10/13/2018   CO2 25 10/13/2018   GLUCOSE 84 10/13/2018   BUN 16 10/13/2018   CREATININE 1.15 10/13/2018   CALCIUM 9.0 10/13/2018   MG 2.0 10/13/2018   No results found for: INR No results found for: CHOL, HDL, LDLCALC, TRIG   GEN- The patient is well appearing, alert and oriented x 3 today.   Head- normocephalic, atraumatic Eyes-  Sclera clear, conjunctiva pink Ears- hearing intact Oropharynx- clear Neck- supple, no JVP Lymph- no cervical lymphadenopathy Lungs- Clear to ausculation bilaterally, normal work of breathing Heart-irregular rate and rhythm, no murmurs, rubs or gallops, PMI not laterally displaced GI- soft, NT, ND, + BS Extremities- no clubbing, cyanosis, or edema MS- no significant deformity or atrophy Skin- no rash or lesion Psych- euthymic mood, full affect Neuro- strength and sensation are intact  EKG-   Echo- 2018-------------------------------------------------------------------- Study Conclusions  - Left ventricle: The cavity size was normal. There was moderate   asymmetric hypertrophy of the septum. The appearance was    consistent with hypertrophic cardiomyopathy. Systolic function   was vigorous. The estimated ejection fraction was in the range of   65% to 70%. There was no dynamic obstruction. Wall motion was   normal; there were no regional wall motion abnormalities. - Mitral valve: There was mild to moderate regurgitation directed   centrally. - Left atrium: The atrium was severely dilated. - Right ventricle: The cavity size was mildly dilated. - Right atrium: The atrium was mildly dilated. - Tricuspid valve: There was moderate regurgitation. - Pulmonary arteries: Systolic pressure was mildly increased. PA   peak pressure: 41 mm Hg (S).   Assessment and Plan: 1. Atrial   flutter   Recent breakthrough episode since 5/30 Mildly symptomatic  I will try adding metoprolol er 25 mg 1/2 tab in the pm He will send another device report Monday after noon If this proves to be persistent, will plan on cardioversion He has very dilated L/R atrium so he is fortunate that his over all burden is low Because of this enlargement, he is not the  best ablation candidate, which Dr. Rayann Heman has stated in his notes  He has an ICD with pacing ability so do not have to be concerned with brady with the addition of low dose BB He was advised not to drink more than 2 glasses of alcoho, he states he may just stop alcohol all together Continue Sotalol 120 mg bid  Continue eliquis 5 mg bid for a CHA2DS2VASc score of at least 3 Labs checked and stable 1/30  2. HCM Stable   per Dr. Rayann Heman   3. ICD Per Dr. Nathen [facility] clinic  I will have the pt send a device report on Monday and is he is still out of rhythm will arrange for cardioversion    Butch Penny C. Haseeb Fiallos, [facility] [address] [place], [place] [address] [phone]

## 2020-02-19 ENCOUNTER — Telehealth: Payer: Self-pay | Admitting: *Deleted

## 2020-02-19 MED ORDER — METOPROLOL SUCCINATE ER 25 MG PO TB24: 12.5000 mg | ORAL_TABLET | Freq: Every day | ORAL | 0 refills | Status: DC | PRN

## 2020-02-19 NOTE — Telephone Encounter (Signed)
Spoke with patient to request manual transmission for review. He agrees to send it this afternoon in approximately 1.5 hours. Pt requests call back with results.

## 2020-02-19 NOTE — Telephone Encounter (Signed)
-----   Message from Juluis Mire, RN sent at 02/15/2020 11:17 AM EDT ----- Regarding: FW: transmission Butch Penny changed plan slightly =-- patient will send transmission on Monday instead of today to reassess rhythm after initiation of metoprolol. ThanksStacy  ----- Message ----- From: Juluis Mire, RN Sent: 02/14/2020   4:23 PM EDT To: [facility] Subject: transmission tomorrow                          Pt to send transmission again tomorrow at [employer] wants to see if he continued persistent as pt cannot tell rhythm.  Please let donna know what transmission shows. Thanks [employer]

## 2020-02-19 NOTE — Telephone Encounter (Signed)
Transmission received and reviewed. Now back in SR, presenting EGM shows AP, AS/VS 60s. AT/AF episode starting on 02/11/20 was 6 days, 17 hrs in duration. Pt made aware of transmission results. He requests clarification as to whether he should continue taking Toprol XL 12.5mg  QHS. Routed to Roderic Palau, NP, as requested.

## 2020-02-19 NOTE — Telephone Encounter (Signed)
Pt back in NSR -- per Roderic Palau Np will stop metoprolol daily. Will use PRN for breakthrough afib. Pt verbalized understanding.

## 2020-03-07 ENCOUNTER — Ambulatory Visit (INDEPENDENT_AMBULATORY_CARE_PROVIDER_SITE_OTHER): Payer: Medicare Other | Admitting: *Deleted

## 2020-03-07 ENCOUNTER — Telehealth (HOSPITAL_COMMUNITY): Payer: Self-pay | Admitting: *Deleted

## 2020-03-07 DIAGNOSIS — I472 Ventricular tachycardia, unspecified: Secondary | ICD-10-CM

## 2020-03-07 LAB — CUP PACEART REMOTE DEVICE CHECK
Battery Remaining Longevity: 65 mo
Battery Remaining Percentage: 65 %
Battery Voltage: 2.98 V
Brady Statistic AP VP Percent: 2.5 %
Brady Statistic AP VS Percent: 42 %
Brady Statistic AS VP Percent: 1 %
Brady Statistic AS VS Percent: 54 %
Brady Statistic RA Percent Paced: 41 %
Brady Statistic RV Percent Paced: 3.3 %
Date Time Interrogation Session: 20210624020020
HighPow Impedance: 50 Ohm
HighPow Impedance: 50 Ohm
Implantable Lead Implant Date: 20141205
Implantable Lead Implant Date: 20141205
Implantable Lead Location: 753859
Implantable Lead Location: 753860
Implantable Lead Model: 7121
Implantable Pulse Generator Implant Date: 20171221
Lead Channel Impedance Value: 440 Ohm
Lead Channel Impedance Value: 490 Ohm
Lead Channel Pacing Threshold Amplitude: 0.5 V
Lead Channel Pacing Threshold Amplitude: 1 V
Lead Channel Pacing Threshold Pulse Width: 0.5 ms
Lead Channel Pacing Threshold Pulse Width: 0.5 ms
Lead Channel Sensing Intrinsic Amplitude: 12 mV
Lead Channel Sensing Intrinsic Amplitude: 4.4 mV
Lead Channel Setting Pacing Amplitude: 2 V
Lead Channel Setting Pacing Amplitude: 2.5 V
Lead Channel Setting Pacing Pulse Width: 0.5 ms
Lead Channel Setting Sensing Sensitivity: 0.5 mV
Pulse Gen Serial Number: 7387049

## 2020-03-07 NOTE — Telephone Encounter (Signed)
Transmission reviewed- Presenting Rhythm is AF.  Current episode started around 12:42pm today.

## 2020-03-07 NOTE — Telephone Encounter (Signed)
Pt instructed to resume 12.5mg  of metoprolol at bedtime -- will see if this converts to NSR. Pt will send transmission if he feels he converts back to NSR.

## 2020-03-07 NOTE — Telephone Encounter (Signed)
Patient wonders if he is back in afib and is sending transmission to confirm rhythm. He states his heart rates have been up intermittently today according to his Chad. Will await transmission and then will call with next steps.

## 2020-03-08 NOTE — Progress Notes (Signed)
Remote ICD transmission.   

## 2020-03-08 NOTE — Telephone Encounter (Signed)
Pt notified of continued AF - he will continue metoprolol through weekend - call on Monday with update.

## 2020-03-08 NOTE — Telephone Encounter (Signed)
Remote transmission from 03/08/20 at 1312 shows patient is still in AFL .

## 2020-03-08 NOTE — Telephone Encounter (Signed)
Pt sending another transmission today to confirm rhythm.

## 2020-03-12 NOTE — Telephone Encounter (Signed)
Presenting EGM from transmission on 03/12/20 at 10:48 shows AP/VS rhythm. AF burden increased in recent weeks, most recent episode on 03/07/20 was 1 day 17 hrs in duration. V rates controlled.

## 2020-03-12 NOTE — Telephone Encounter (Signed)
Pt called to say he sent transmission today - will await report from device clinic - if still out of rhythm and is he persistent? Pt unsure. Still on metoprolol daily. WIll call pt tomorrow with update of what transmission shows.

## 2020-03-13 NOTE — Telephone Encounter (Signed)
Per Roderic Palau NP will have pt follow up with Dr. Rayann Heman for treatment plan with increased burden on sotalol. Will send to scheduling.

## 2020-03-16 ENCOUNTER — Other Ambulatory Visit: Payer: Self-pay | Admitting: Physician Assistant

## 2020-03-21 ENCOUNTER — Telehealth: Payer: Self-pay | Admitting: Internal Medicine

## 2020-03-21 MED ORDER — SOTALOL HCL 120 MG PO TABS: ORAL_TABLET | ORAL | 1 refills | Status: DC

## 2020-03-21 NOTE — Telephone Encounter (Signed)
*  STAT* If patient is at the pharmacy, call can be transferred to refill team.   1. Which medications need to be refilled? (please list name of each medication and dose if known) sotalol (BETAPACE) 120 MG tablet  2. Which pharmacy/location (including street and city if local pharmacy) is medication to be sent to? [facility], [place] - [address] AT [place]  3. Do they need a 30 day or 90 day supply? 90  Pt will take the last dose of his medication tomorrow and will not have any medication for the weekend on

## 2020-03-21 NOTE — Telephone Encounter (Signed)
Pt' medication was sent to pt's pharmacy as requested. Confirmation received.  

## 2020-04-16 ENCOUNTER — Telehealth: Payer: Self-pay

## 2020-04-16 NOTE — Telephone Encounter (Signed)
Pt transmission was received 04-16-2020.

## 2020-04-16 NOTE — Telephone Encounter (Addendum)
Spoke with pt regarding virtual appt on 04/17/20. Pt was advise to check his vitals and review his medications prior to his appt. Pt asked if he should send a device transmission. Pt was advise to not send in another transmission unless Dr. Rayann Heman advised him to do so. Pt was also advised that if he sends in a transmission, Dr Rayann Heman may not receive it in time to discuss at the time of his appt. Pt disagreed, and stated he will send a transmission. Pt confirmed virtual visit.

## 2020-04-17 ENCOUNTER — Telehealth (INDEPENDENT_AMBULATORY_CARE_PROVIDER_SITE_OTHER): Payer: Medicare Other | Admitting: Internal Medicine

## 2020-04-17 ENCOUNTER — Other Ambulatory Visit: Payer: Self-pay

## 2020-04-17 VITALS — BP 117/66 | HR 78

## 2020-04-17 DIAGNOSIS — I480 Paroxysmal atrial fibrillation: Secondary | ICD-10-CM

## 2020-04-17 DIAGNOSIS — I472 Ventricular tachycardia, unspecified: Secondary | ICD-10-CM

## 2020-04-17 DIAGNOSIS — D6869 Other thrombophilia: Secondary | ICD-10-CM

## 2020-04-17 DIAGNOSIS — I484 Atypical atrial flutter: Secondary | ICD-10-CM | POA: Diagnosis not present

## 2020-04-17 DIAGNOSIS — I422 Other hypertrophic cardiomyopathy: Secondary | ICD-10-CM

## 2020-04-17 NOTE — Progress Notes (Signed)
Electrophysiology TeleHealth Note   Due to national recommendations of social distancing due to COVID 19, an audio/video telehealth visit is felt to be most appropriate for this patient at this time.  See MyChart message from today for the patient's consent to telehealth for [facility].  Date:  04/17/2020   ID:  Joseph Morrow, DOB 14-Sep-1946, MRN [id number]  Location: patient's home  Provider location:  [facility] [place]  Evaluation Performed: Follow-up visit  PCP:  Marton Redwood, MD   Electrophysiologist:  Dr Rayann Heman  Chief Complaint:  palpitations  History of Present Illness:    Joseph Morrow is a 74 y.o. male who presents via telehealth conferencing today.  Since last being seen in our clinic, the patient reports doing very well.  He has done well recently.  He did have increased afib iin June but feels that this has subsequently settled down.  He feels that his afib burden is stable.  Today, he denies symptoms of palpitations, chest pain, shortness of breath,  lower extremity edema, dizziness, presyncope, or syncope.  The patient is otherwise without complaint today.   Past Medical History:  Diagnosis Date  . Acute cholecystitis 06/21/2011  . Allergy    SEASONAL  . Cataract   . Complication of anesthesia    HALLUCINATIONS  . GERD (gastroesophageal reflux disease)   . Hearing loss   . Hemorrhoids   . Hiatal hernia   . Hypertrophic cardiomyopathy ([place])   . Mitral regurgitation    Moderate  . Paroxysmal atrial fibrillation ([place]) 04/2015   documented on ICD remote interrogation, treated with sotalol  . Septal defect    LV Septal hyptertrophy 23mm, normal EF, minimal LVOT gradiant.  . Tubular adenoma of colon   . VT (ventricular tachycardia) ([place]) 08/18/2013   s/p St. Jude ICD    Past Surgical History:  Procedure Laterality Date  . APPENDECTOMY  1950  . CARDIOVERSION N/A 02/25/2017   Procedure: CARDIOVERSION;  Surgeon: Fay Records, MD;  Location: [facility];  Service: Cardiovascular;  Laterality: N/A;  . CHOLECYSTECTOMY  06/24/2011  . COLONOSCOPY    . EP IMPLANTABLE DEVICE N/A 09/03/2016   SJM Fortify Assura Dr generator change by Dr Rayann Heman.  ICD implanted for secondary prevention of sudden death  . IMPLANTABLE CARDIOVERTER DEFIBRILLATOR IMPLANT  08/18/2013   St. Jude Fortify Assura DR ICD implanted by Dr Rayann Heman for secondary treatment of VT  . IMPLANTABLE CARDIOVERTER DEFIBRILLATOR IMPLANT N/A 08/18/2013   Procedure: IMPLANTABLE CARDIOVERTER DEFIBRILLATOR IMPLANT;  Surgeon: Coralyn Mark, MD;  Location: [facility] CATH LAB;  Service: Cardiovascular;  Laterality: N/A;    Current Outpatient Medications  Medication Sig Dispense Refill  . acidophilus (RISAQUAD) CAPS capsule Take 1 capsule by mouth daily.    . Calcium Carb-Cholecalciferol (CALCIUM 600+D) 600-800 MG-UNIT TABS Take 1 tablet by mouth daily.    . Cholecalciferol (VITAMIN D3) 25 MCG (1000 UT) CAPS Take 1 capsule by mouth 2 (two) times daily.     . Coenzyme Q10 (CO Q 10) 100 MG CAPS Take 100 mg by mouth daily.     Marland Kitchen ELIQUIS 5 MG TABS tablet TAKE 1 TABLET BY MOUTH TWICE DAILY 60 tablet 6  . glucosamine-chondroitin 500-400 MG tablet Take 1 tablet by mouth daily.    . metoprolol succinate (TOPROL-XL) 25 MG 24 hr tablet Take 0.5 tablets (12.5 mg total) by mouth daily as needed (breakthrough afib). 45 tablet 0  . pantoprazole (PROTONIX) 40 MG tablet Take 20 mg by mouth daily.     Marland Kitchen  sotalol (BETAPACE) 120 MG tablet TAKE 1 TABLET(120 MG) BY MOUTH TWICE DAILY 180 tablet 1  . Zinc 50 MG CAPS Take 1 capsule by mouth 2 (two) times daily.      No current facility-administered medications for this visit.    Allergies:   Dextromethorphan; Versed [midazolam]; and Antihistamines, diphenhydramine-type   Social History:  The patient  reports that he quit smoking about 38 years ago. His smoking use included cigarettes. He has a 40.00 pack-year smoking history. He has never used smokeless tobacco. He  reports current alcohol use. He reports that he does not use drugs.   ROS:  Please see the history of present illness.   All other systems are personally reviewed and negative.    Exam:    Vital Signs:  BP 117/66   Pulse 78   Well sounding and appearing, alert and conversant, regular work of breathing,  good skin color Eyes- anicteric, neuro- grossly intact, skin- no apparent rash or lesions or cyanosis, mouth- oral mucosa is pink  Labs/Other Tests and Data Reviewed:    Recent Labs: No results found for requested labs within last 8760 hours.   Wt Readings from Last 3 Encounters:  02/14/20 181 lb 9.6 oz (82.4 kg)  10/03/19 184 lb (83.5 kg)  09/29/19 184 lb (83.5 kg)     Last device remote is reviewed from [facility] PDF which reveals normal device function, nafib burden was 3.9%   ASSESSMENT & PLAN:    1.  HCM Remotes are up to date Echo 12/20 reviewed with him today No changes I have offered genetic testing again today which he declines  2. Paroxysmal atrial fibrillation afib burden is stable at 3.8% Continue sotalol We will  Need to follow him closely on this medicine to avoid toxicity ekg 02/14/20 reviewed He wishes to have labs followed by PCP We may need to do these in office on return We discussed ablation as an option.  He wishes to avoid this currently  3. VT Controlled with sotalol Normal ICD function   Risks, benefits and potential toxicities for medications prescribed and/or refilled reviewed with patient today.   Follow-up:  Return to see me in 6 months   Patient Risk:  after full review of this patients clinical status, I feel that they are at moderate risk at this time.  Today, I have spent 25 minutes with the patient with telehealth technology discussing arrhythmia management .    Army Fossa, MD  04/17/2020 10:37 AM     [facility] [address] [place] [address] [phone] (office) [phone]  (fax)

## 2020-05-14 DIAGNOSIS — L821 Other seborrheic keratosis: Secondary | ICD-10-CM | POA: Diagnosis not present

## 2020-05-14 DIAGNOSIS — D1801 Hemangioma of skin and subcutaneous tissue: Secondary | ICD-10-CM | POA: Diagnosis not present

## 2020-05-14 DIAGNOSIS — L308 Other specified dermatitis: Secondary | ICD-10-CM | POA: Diagnosis not present

## 2020-05-14 DIAGNOSIS — L812 Freckles: Secondary | ICD-10-CM | POA: Diagnosis not present

## 2020-05-14 DIAGNOSIS — L570 Actinic keratosis: Secondary | ICD-10-CM | POA: Diagnosis not present

## 2020-05-23 ENCOUNTER — Other Ambulatory Visit: Payer: Self-pay | Admitting: Cardiology

## 2020-05-23 NOTE — Telephone Encounter (Signed)
Pt last saw Dr Rayann Heman 04/17/20 video visit Covid-19, last labs 06/30/19 Creat 1.3 per [facility] at [facility] center, age 74, weight 82.4, based on specified criteria pt is on appropriate dosage of Eliquis 5mg  BID.  Will refill rx.

## 2020-05-23 NOTE — Telephone Encounter (Signed)
This rx has already been refilled this am for 60 tablets with 6 refills, confirmation receipt confirmed by pharmacy.

## 2020-05-24 ENCOUNTER — Other Ambulatory Visit (HOSPITAL_COMMUNITY): Payer: Self-pay | Admitting: Nurse Practitioner

## 2020-05-24 MED ORDER — METOPROLOL SUCCINATE ER 25 MG PO TB24: 12.5000 mg | ORAL_TABLET | Freq: Every day | ORAL | 3 refills | Status: DC | PRN

## 2020-05-27 ENCOUNTER — Other Ambulatory Visit (HOSPITAL_COMMUNITY): Payer: Self-pay | Admitting: *Deleted

## 2020-05-27 MED ORDER — METOPROLOL SUCCINATE ER 25 MG PO TB24: 12.5000 mg | ORAL_TABLET | Freq: Every day | ORAL | 3 refills | Status: DC | PRN

## 2020-06-06 ENCOUNTER — Ambulatory Visit (INDEPENDENT_AMBULATORY_CARE_PROVIDER_SITE_OTHER): Payer: Medicare Other

## 2020-06-06 DIAGNOSIS — I472 Ventricular tachycardia, unspecified: Secondary | ICD-10-CM

## 2020-06-11 ENCOUNTER — Telehealth: Payer: Self-pay | Admitting: Internal Medicine

## 2020-06-11 ENCOUNTER — Ambulatory Visit (HOSPITAL_COMMUNITY)
Admission: RE | Admit: 2020-06-11 | Discharge: 2020-06-11 | Disposition: A | Discharge status: Home / Self Care | Payer: Medicare Other | Source: Ambulatory Visit | Attending: Nurse Practitioner | Admitting: Nurse Practitioner

## 2020-06-11 ENCOUNTER — Other Ambulatory Visit: Payer: Self-pay

## 2020-06-11 VITALS — BP 88/70 | HR 117 | Ht 70.0 in | Wt 173.2 lb

## 2020-06-11 DIAGNOSIS — D6869 Other thrombophilia: Secondary | ICD-10-CM

## 2020-06-11 DIAGNOSIS — I4819 Other persistent atrial fibrillation: Secondary | ICD-10-CM | POA: Diagnosis not present

## 2020-06-11 DIAGNOSIS — Z87891 Personal history of nicotine dependence: Secondary | ICD-10-CM | POA: Diagnosis not present

## 2020-06-11 DIAGNOSIS — I422 Other hypertrophic cardiomyopathy: Secondary | ICD-10-CM | POA: Insufficient documentation

## 2020-06-11 DIAGNOSIS — Z79899 Other long term (current) drug therapy: Secondary | ICD-10-CM | POA: Insufficient documentation

## 2020-06-11 DIAGNOSIS — K219 Gastro-esophageal reflux disease without esophagitis: Secondary | ICD-10-CM | POA: Insufficient documentation

## 2020-06-11 DIAGNOSIS — I081 Rheumatic disorders of both mitral and tricuspid valves: Secondary | ICD-10-CM | POA: Diagnosis not present

## 2020-06-11 DIAGNOSIS — I480 Paroxysmal atrial fibrillation: Secondary | ICD-10-CM | POA: Diagnosis not present

## 2020-06-11 DIAGNOSIS — I4892 Unspecified atrial flutter: Secondary | ICD-10-CM | POA: Diagnosis not present

## 2020-06-11 DIAGNOSIS — Z7901 Long term (current) use of anticoagulants: Secondary | ICD-10-CM | POA: Diagnosis not present

## 2020-06-11 DIAGNOSIS — I484 Atypical atrial flutter: Secondary | ICD-10-CM | POA: Diagnosis not present

## 2020-06-11 DIAGNOSIS — Z95810 Presence of automatic (implantable) cardiac defibrillator: Secondary | ICD-10-CM | POA: Insufficient documentation

## 2020-06-11 LAB — CUP PACEART REMOTE DEVICE CHECK
Battery Remaining Longevity: 62 mo
Battery Remaining Percentage: 63 %
Battery Voltage: 2.96 V
Brady Statistic AP VP Percent: 2.8 %
Brady Statistic AP VS Percent: 44 %
Brady Statistic AS VP Percent: 1 %
Brady Statistic AS VS Percent: 52 %
Brady Statistic RA Percent Paced: 43 %
Brady Statistic RV Percent Paced: 3.7 %
Date Time Interrogation Session: 20210923020016
HighPow Impedance: 50 Ohm
HighPow Impedance: 50 Ohm
Implantable Lead Implant Date: 20141205
Implantable Lead Implant Date: 20141205
Implantable Lead Location: 753859
Implantable Lead Location: 753860
Implantable Lead Model: 7121
Implantable Pulse Generator Implant Date: 20171221
Lead Channel Impedance Value: 440 Ohm
Lead Channel Impedance Value: 510 Ohm
Lead Channel Pacing Threshold Amplitude: 0.5 V
Lead Channel Pacing Threshold Amplitude: 1 V
Lead Channel Pacing Threshold Pulse Width: 0.5 ms
Lead Channel Pacing Threshold Pulse Width: 0.5 ms
Lead Channel Sensing Intrinsic Amplitude: 12 mV
Lead Channel Sensing Intrinsic Amplitude: 4.4 mV
Lead Channel Setting Pacing Amplitude: 2 V
Lead Channel Setting Pacing Amplitude: 2.5 V
Lead Channel Setting Pacing Pulse Width: 0.5 ms
Lead Channel Setting Sensing Sensitivity: 0.5 mV
Pulse Gen Serial Number: 7387049

## 2020-06-11 NOTE — Telephone Encounter (Signed)
Calling patient to assist with sending transmission.  Unable to send manual transmission d/t home remote box error. Patient advised to call [facility] at [phone]. Requested patient call us back at the device clinic with an update. Agrees to plan.

## 2020-06-11 NOTE — Telephone Encounter (Signed)
STAT if HR is under 50 or over 120 (normal HR is 60-100 beats per minute)  1) What is your heart rate? 115-120  2) Do you have a log of your heart rate readings (document readings)? Patient states he has a fitbit and a pacemaker so you have a log of it.   3) Do you have any other symptoms? Stated doesn't feel prefect but does feel that bad.

## 2020-06-11 NOTE — Progress Notes (Signed)
Primary Care Physician: Marton Redwood, MD Referring [place] Triage EP: Dr. Everlean Patterson Joseph Morrow is a 74 y.o. male with a h/o hypertrophic cardiomyopathy, s/p ICD for VT, remote paroxysmal afib, maintaining SR with a very small afib burden on Sotalol. Hospitalized for sotalol administration 02/2017. Chadsvasc score of 2(age, LV dysfunction), continues on eliquis 5 mg bid.  Qtc stable.   He asked to be seen today for afib noted yesterday. Interrogation of ICD/PPM confirms afib on 2/10 and 2/11, mostly starting 2/10 at 11:54 pm and continuing to 5:03 am, 2/11. He states that he drinks alcohol nightly and he often takes his sotalol 14 hours apart. Otherwise, no change from his usual health. Denies snoring. He continues on sotalol.  F/u in afib clinic, 02/14/20. I am seeing him as the device clinic noted onset of atrial flutter, rate controlled since  5/30. In the office he continues in rate controlled aflutter. He is mildly symptomatic with this. He continues on sotalol 120 mg bid, and is compliant. He has 2 alcoholic drinks a week. He had been on BB in the past . Stopped when sotalol loaded. Continues on eliquis 5 mg bid for a CHA2DS2VASc score of 3.   F/u in afib clinic, 03/11/20, as an urgent work-in as he noted increase in HR last night via Fitbit and confirmed with [name]. If not for seeing his HR elevated, he would not have felt  differently out of rhythm. He is on sotalol 120 mg bid and has been on this for several years. He saw Dr. Rayann Heman in June , was doing ok, but it was mentioned in his note, that if afib burden increased, he could consider ablation.  No significant change in health recently. No alcohol, caffeine, tobacco.  By  by the device clinic report this am, he appears to be in an atrial flutter at 117 bpm. BP on arrival 88/70. With a glass of water, BP came up to 104/70. The only medicine he is on is 12.5 metoprolol succinate that would affect BP. This was  added in June on an occasion  when he was out of rhythm. He will usually convert himself.   Today, he denies symptoms of   chest pain, shortness of breath, orthopnea, PND, lower extremity edema, dizziness, presyncope, syncope, or neurologic sequela.  Positive for palpitations..The patient is tolerating medications without difficulties and is otherwise without complaint today.   Past Medical History:  Diagnosis Date  . Acute cholecystitis 06/21/2011  . Allergy    SEASONAL  . Cataract   . Complication of anesthesia    HALLUCINATIONS  . GERD (gastroesophageal reflux disease)   . Hearing loss   . Hemorrhoids   . Hiatal hernia   . Hypertrophic cardiomyopathy ([place])   . Mitral regurgitation    Moderate  . Paroxysmal atrial fibrillation ([place]) 04/2015   documented on ICD remote interrogation, treated with sotalol  . Septal defect    LV Septal hyptertrophy 24mm, normal EF, minimal LVOT gradiant.  . Tubular adenoma of colon   . VT (ventricular tachycardia) ([place]) 08/18/2013   s/p St. Jude ICD   Past Surgical History:  Procedure Laterality Date  . APPENDECTOMY  1950  . CARDIOVERSION N/A 02/25/2017   Procedure: CARDIOVERSION;  Surgeon: Fay Records, MD;  Location: [facility];  Service: Cardiovascular;  Laterality: N/A;  . CHOLECYSTECTOMY  06/24/2011  . COLONOSCOPY    . EP IMPLANTABLE DEVICE N/A 09/03/2016   SJM Fortify Assura Dr generator change  by Dr Rayann Heman.  ICD implanted for secondary prevention of sudden death  . IMPLANTABLE CARDIOVERTER DEFIBRILLATOR IMPLANT  08/18/2013   St. Jude Fortify Assura DR ICD implanted by Dr Rayann Heman for secondary treatment of VT  . IMPLANTABLE CARDIOVERTER DEFIBRILLATOR IMPLANT N/A 08/18/2013   Procedure: IMPLANTABLE CARDIOVERTER DEFIBRILLATOR IMPLANT;  Surgeon: Coralyn Mark, MD;  Location: [facility];  Service: Cardiovascular;  Laterality: N/A;    Current Outpatient Medications  Medication Sig Dispense Refill  . acidophilus (RISAQUAD) CAPS capsule Take  1 capsule by mouth daily.    . Calcium Carb-Cholecalciferol (CALCIUM 600+D) 600-800 MG-UNIT TABS Take 1 tablet by mouth daily.    . Cholecalciferol (VITAMIN D3) 25 MCG (1000 UT) CAPS Take 1 capsule by mouth 2 (two) times daily.     . Coenzyme Q10 (CO Q 10) 100 MG CAPS Take 100 mg by mouth daily.     Marland Kitchen ELIQUIS 5 MG TABS tablet TAKE 1 TABLET BY MOUTH TWICE DAILY 60 tablet 6  . glucosamine-chondroitin 500-400 MG tablet Take 1 tablet by mouth daily.    . metoprolol succinate (TOPROL-XL) 25 MG 24 hr tablet Take 0.5 tablets (12.5 mg total) by mouth daily as needed (breakthrough afib). 45 tablet 3  . Omeprazole Magnesium (PRILOSEC OTC PO) Take 10 mg by mouth. Taking 1/2 tablet by mouth daily    . sotalol (BETAPACE) 120 MG tablet TAKE 1 TABLET(120 MG) BY MOUTH TWICE DAILY 180 tablet 1  . Zinc 50 MG CAPS Take 1 capsule by mouth 2 (two) times daily.      No current facility-administered medications for this encounter.    Allergies  Allergen Reactions  . Dextromethorphan Other (See Comments)    nervous  . Versed [Midazolam] Other (See Comments)    "hallucinations, headache"  . Antihistamines, Diphenhydramine-Type Anxiety    Social History   Socioeconomic History  . Marital status: Married    Spouse name: Not on file  . Number of children: 2  . Years of education: Not on file  . Highest education level: Not on file  Occupational History  . Occupation: retired  Tobacco Use  . Smoking status: Former Smoker    Packs/day: 2.00    Years: 20.00    Pack years: 40.00    Types: Cigarettes    Quit date: 07/06/1981    Years since quitting: 38.9  . Smokeless tobacco: Never Used  Vaping Use  . Vaping Use: Never used  Substance and Sexual Activity  . Alcohol use: Yes    Alcohol/week: 0.0 standard drinks    Comment: 2 glasses per day  . Drug use: No  . Sexual activity: Not on file  Other Topics Concern  . Not on file  Social History Narrative   Originally from [place]. Moved to [place] in 2012.  Currently has no pets. No bird or hot tub exposure. Previously worked running [place]. He also worked as a Air cabin crew. He did have exposure to Beneze, Acetone, Styrene, & Hydrocarbon early in his career.   Social Determinants of Health   Financial Resource Strain:   . Difficulty of Paying Living Expenses: Not on file  Food Insecurity:   . Worried About Charity fundraiser in the Last Year: Not on file  . Ran Out of Food in the Last Year: Not on file  Transportation Needs:   . Lack of Transportation (Medical): Not on file  . Lack of Transportation (Non-Medical): Not on file  Physical Activity:   . Days of Exercise  per Week: Not on file  . Minutes of Exercise per Session: Not on file  Stress:   . Feeling of Stress : Not on file  Social Connections:   . Frequency of Communication with Friends and Family: Not on file  . Frequency of Social Gatherings with Friends and Family: Not on file  . Attends Religious Services: Not on file  . Active Member of Clubs or Organizations: Not on file  . Attends Archivist Meetings: Not on file  . Marital Status: Not on file  Intimate Partner Violence:   . Fear of Current or Ex-Partner: Not on file  . Emotionally Abused: Not on file  . Physically Abused: Not on file  . Sexually Abused: Not on file    Family History  Problem Relation Age of Onset  . Hypertrophic cardiomyopathy Mother   . Arrhythmia Mother   . Heart failure Mother   . Pneumonia Father   . Diabetes Father   . Colon polyps Father   . Heart attack Brother   . Hypertrophic cardiomyopathy Brother   . Heart Problems Brother        VAD  . Arrhythmia Brother   . Diabetes Brother   . Sudden death Brother   . Lung disease Neg Hx     ROS- All systems are reviewed and negative except as per the HPI above  Physical Exam: Vitals:   06/11/20 1131  BP: (!) 88/70  Pulse: (!) 117  Weight: 78.6 kg  Height: 5\' 10"  (1.778 m)   Wt Readings from Last 3  Encounters:  06/11/20 78.6 kg  02/14/20 82.4 kg  10/03/19 83.5 kg    Labs: Lab Results  Component Value Date   NA 141 10/13/2018   K 4.3 10/13/2018   CL 101 10/13/2018   CO2 25 10/13/2018   GLUCOSE 84 10/13/2018   BUN 16 10/13/2018   CREATININE 1.15 10/13/2018   CALCIUM 9.0 10/13/2018   MG 2.0 10/13/2018   No results found for: INR No results found for: CHOL, HDL, LDLCALC, TRIG   GEN- The patient is well appearing, alert and oriented x 3 today.   Head- normocephalic, atraumatic Eyes-  Sclera clear, conjunctiva pink Ears- hearing intact Oropharynx- clear Neck- supple, no JVP Lymph- no cervical lymphadenopathy Lungs- Clear to ausculation bilaterally, normal work of breathing Heart-irregular rate and rhythm, no murmurs, rubs or gallops, PMI not laterally displaced GI- soft, NT, ND, + BS Extremities- no clubbing, cyanosis, or edema MS- no significant deformity or atrophy Skin- no rash or lesion Psych- euthymic mood, full affect Neuro- strength and sensation are intact  EKG-   1. Hypertrophic cardiomyopathy, sigmoid subtype. Basal septum measures 18  mm in maximal dimension. Flow acceleration noted at the LVOT, no LVOT  obstruction noted. Chordal systolic anterior motion of the mitral valve  without involvment of the anterior  leaflet.  2. Left ventricular ejection fraction, by visual estimation, is 60 to  65%. The left ventricle has normal function. There is severely increased  left ventricular hypertrophy.  3. Elevated left atrial and left ventricular end-diastolic pressures.  4. Left ventricular diastolic parameters are consistent with Grade III  diastolic dysfunction (restrictive).  5. Global right ventricle has normal systolic function.The right  ventricular size is normal. No increase in right ventricular wall  thickness.  6. Left atrial size was moderately dilated.  7. Right atrial size was moderately dilated.  8. The mitral valve is normal in  structure. Mild mitral valve  regurgitation. No  evidence of mitral stenosis.  9. The tricuspid valve is normal in structure. Tricuspid valve  regurgitation is mild.  10. The aortic valve is tricuspid. Aortic valve regurgitation is not  visualized. No evidence of aortic valve sclerosis or stenosis.  11. The pulmonic valve was normal in structure. Pulmonic valve  regurgitation is mild.  12. Moderately elevated pulmonary artery systolic pressure.  13. The inferior vena cava is dilated in size with <50% respiratory  variability, suggesting right atrial pressure of 15 mmHg.  14. The left ventricle has no regional wall motion abnormalities.  Echo- 2020----------------------------------------------------------------- Study Conclusions  - Left ventricle: The cavity size was normal. There was moderate   asymmetric hypertrophy of the septum. The appearance was   consistent with hypertrophic cardiomyopathy. Systolic function   was vigorous. The estimated ejection fraction was in the range of   65% to 70%. There was no dynamic obstruction. Wall motion was   normal; there were no regional wall motion abnormalities. - Mitral valve: There was mild to moderate regurgitation directed   centrally. - Left atrium: The atrium was severely dilated. - Right ventricle: The cavity size was mildly dilated. - Right atrium: The atrium was mildly dilated. - Tricuspid valve: There was moderate regurgitation. - Pulmonary arteries: Systolic pressure was mildly increased. PA   peak pressure: 41 mm Hg (S).   Assessment and Plan: 1. Atrial   flutter   Recent breakthrough episodes  morseo since June of this year   Mostly asymptomatic/mildly symptomatic  He is on sotalol 120 mg bid  He is on  metoprolol succinate 12.5 mg qd He has very dilated L/R atrium so he is fortunate that his over all burden has been fairly low until recent  With soft BP's today, limited in ability to add more rate control  Hopefully he  will self  convert  Continue eliquis 5 mg bid for a CHA2DS2VASc score of at least 3  2. HCM Stable  ICD  per Dr. Rayann Heman   Will refer back to Dr. Rayann Heman to see if management  of afib needs to be readdressed     Joseph Morrow, [facility] [address] [place], [place] [address] [phone]

## 2020-06-11 NOTE — Telephone Encounter (Signed)
Pt coming in for assessment with Butch Penny at [facility] today.

## 2020-06-11 NOTE — Telephone Encounter (Signed)
Manual transmission received 06/11/20.  Presenting A-Flutter w/ RVR 120's.   Routing to AF Clinic and [place], [place]

## 2020-06-12 ENCOUNTER — Ambulatory Visit (INDEPENDENT_AMBULATORY_CARE_PROVIDER_SITE_OTHER): Payer: Medicare Other | Admitting: Internal Medicine

## 2020-06-12 ENCOUNTER — Encounter: Payer: Self-pay | Admitting: Internal Medicine

## 2020-06-12 ENCOUNTER — Other Ambulatory Visit: Payer: Self-pay

## 2020-06-12 DIAGNOSIS — I4891 Unspecified atrial fibrillation: Secondary | ICD-10-CM | POA: Diagnosis not present

## 2020-06-12 DIAGNOSIS — I422 Other hypertrophic cardiomyopathy: Secondary | ICD-10-CM | POA: Diagnosis not present

## 2020-06-12 DIAGNOSIS — I472 Ventricular tachycardia, unspecified: Secondary | ICD-10-CM

## 2020-06-12 DIAGNOSIS — I480 Paroxysmal atrial fibrillation: Secondary | ICD-10-CM

## 2020-06-12 LAB — CUP PACEART INCLINIC DEVICE CHECK
Battery Remaining Longevity: 64 mo
Brady Statistic RA Percent Paced: 43 %
Brady Statistic RV Percent Paced: 3.9 %
Date Time Interrogation Session: 20210929165932
HighPow Impedance: 49.7141
Implantable Lead Implant Date: 20141205
Implantable Lead Implant Date: 20141205
Implantable Lead Location: 753859
Implantable Lead Location: 753860
Implantable Lead Model: 7121
Implantable Pulse Generator Implant Date: 20171221
Lead Channel Impedance Value: 437.5 Ohm
Lead Channel Impedance Value: 512.5 Ohm
Lead Channel Pacing Threshold Amplitude: 0.5 V
Lead Channel Pacing Threshold Amplitude: 0.5 V
Lead Channel Pacing Threshold Pulse Width: 0.5 ms
Lead Channel Pacing Threshold Pulse Width: 0.5 ms
Lead Channel Sensing Intrinsic Amplitude: 12 mV
Lead Channel Sensing Intrinsic Amplitude: 2.2 mV
Lead Channel Setting Pacing Amplitude: 2 V
Lead Channel Setting Pacing Amplitude: 2.5 V
Lead Channel Setting Pacing Pulse Width: 0.5 ms
Lead Channel Setting Sensing Sensitivity: 0.5 mV
Pulse Gen Serial Number: 7387049

## 2020-06-12 NOTE — Patient Instructions (Addendum)
Medication Instructions:  Your physician recommends that you continue on your current medications as directed. Please refer to the Current Medication list given to you today.  *If you need a refill on your cardiac medications before your next appointment, please call your pharmacy*  Lab Work: CBC, BMP  If you have labs (blood work) drawn today and your tests are completely normal, you will receive your results only by: Marland Kitchen MyChart Message (if you have MyChart) OR . A paper copy in the mail If you have any lab test that is abnormal or we need to change your treatment, we will call you to review the results.  Testing/Procedures: Your physician has recommended that you have an ablation. Catheter ablation is a medical procedure used to treat some cardiac arrhythmias (irregular heartbeats). During catheter ablation, a long, thin, flexible tube is put into a blood vessel in your groin (upper thigh), or neck. This tube is called an ablation catheter. It is then guided to your heart through the blood vessel. Radio frequency waves destroy small areas of heart tissue where abnormal heartbeats may cause an arrhythmia to start. Please see the instruction sheet given to you today.   Follow-Up: At [facility], you and your health needs are our priority.  As part of our continuing mission to provide you with exceptional heart care, we have created designated Provider Care Teams.  These Care Teams include your primary Cardiologist (physician) and Advanced Practice Providers (APPs -  Physician Assistants and Nurse Practitioners) who all work together to provide you with the care you need, when you need it.  We recommend signing up for the patient portal called "MyChart".  Sign up information is provided on this After Visit Summary.  MyChart is used to connect with patients for Virtual Visits (Telemedicine).  Patients are able to view lab/test results, encounter notes, upcoming appointments, etc.  Non-urgent  messages can be sent to your provider as well.   To learn more about what you can do with MyChart, go to [email or web address].     Remote monitoring is used to monitor your ICD from home. This monitoring reduces the number of office visits required to check your device to one time per year. It allows [place] to keep an eye on the functioning of your device to ensure it is working properly. You are scheduled for a device check from home on 09/05/2020. You may send your transmission at any time that day. If you have a wireless device, the transmission will be sent automatically. After your physician reviews your transmission, you will receive a postcard with your next transmission date.  Other Instructions:  Cardiac Ablation Cardiac ablation is a procedure to disable (ablate) a small amount of heart tissue in very specific places. The heart has many electrical connections. Sometimes these connections are abnormal and can cause the heart to beat very fast or irregularly. Ablating some of the problem areas can improve the heart rhythm or return it to normal. Ablation may be done for people who:  Have Wolff-Parkinson-White syndrome.  Have fast heart rhythms (tachycardia).  Have taken medicines for an abnormal heart rhythm (arrhythmia) that were not effective or caused side effects.  Have a high-risk heartbeat that may be life-threatening. During the procedure, a small incision is made in the neck or the groin, and a long, thin, flexible tube (catheter) is inserted into the incision and moved to the heart. Small devices (electrodes) on the tip of the catheter will send out electrical currents. A type  of X-ray (fluoroscopy) will be used to help guide the catheter and to provide images of the heart. Tell a health care provider about:  Any allergies you have.  All medicines you are taking, including vitamins, herbs, eye drops, creams, and over-the-counter medicines.  Any problems you or family members  have had with anesthetic medicines.  Any blood disorders you have.  Any surgeries you have had.  Any medical conditions you have, such as kidney failure.  Whether you are pregnant or may be pregnant. What are the risks? Generally, this is a safe procedure. However, problems may occur, including:  Infection.  Bruising and bleeding at the catheter insertion site.  Bleeding into the chest, especially into the sac that surrounds the heart. This is a serious complication.  Stroke or blood clots.  Damage to other structures or organs.  Allergic reaction to medicines or dyes.  Need for a permanent pacemaker if the normal electrical system is damaged. A pacemaker is a small computer that sends electrical signals to the heart and helps your heart beat normally.  The procedure not being fully effective. This may not be recognized until months later. Repeat ablation procedures are sometimes required. What happens before the procedure?  Follow instructions from your health care provider about eating or drinking restrictions.  Ask your health care provider about: ? Changing or stopping your regular medicines. This is especially important if you are taking diabetes medicines or blood thinners. ? Taking medicines such as aspirin and ibuprofen. These medicines can thin your blood. Do not take these medicines before your procedure if your health care provider instructs you not to.  Plan to have someone take you home from the hospital or clinic.  If you will be going home right after the procedure, plan to have someone with you for 24 hours. What happens during the procedure?  To lower your risk of infection: ? Your health care team will wash or sanitize their hands. ? Your skin will be washed with soap. ? Hair may be removed from the incision area.  An IV tube will be inserted into one of your veins.  You will be given a medicine to help you relax (sedative).  The skin on your neck or  groin will be numbed.  An incision will be made in your neck or your groin.  A needle will be inserted through the incision and into a large vein in your neck or groin.  A catheter will be inserted into the needle and moved to your heart.  Dye may be injected through the catheter to help your surgeon see the area of the heart that needs treatment.  Electrical currents will be sent from the catheter to ablate heart tissue in desired areas. There are three types of energy that may be used to ablate heart tissue: ? Heat (radiofrequency energy). ? Laser energy. ? Extreme cold (cryoablation).  When the necessary tissue has been ablated, the catheter will be removed.  Pressure will be held on the catheter insertion area to prevent excessive bleeding.  A bandage (dressing) will be placed over the catheter insertion area. The procedure may vary among health care providers and hospitals. What happens after the procedure?  Your blood pressure, heart rate, breathing rate, and blood oxygen level will be monitored until the medicines you were given have worn off.  Your catheter insertion area will be monitored for bleeding. You will need to lie still for a few hours to ensure that you do not  bleed from the catheter insertion area.  Do not drive for 24 hours or as long as directed by your health care provider. Summary  Cardiac ablation is a procedure to disable (ablate) a small amount of heart tissue in very specific places. Ablating some of the problem areas can improve the heart rhythm or return it to normal.  During the procedure, electrical currents will be sent from the catheter to ablate heart tissue in desired areas. This information is not intended to replace advice given to you by your health care provider. Make sure you discuss any questions you have with your health care provider. Document Revised: 02/21/2018 Document Reviewed: 07/20/2016 Elsevier Patient Education  [place].

## 2020-06-12 NOTE — Progress Notes (Signed)
PCP: Marton Redwood, MD Primary Cardiologist: Dr Marlou Porch Primary EP: Dr Salome Spotted is a 74 y.o. male who presents today for routine electrophysiology followup.  Since last being seen in our clinic, the patient reports doing reasonably well.  He has had progression of his afib.  + fatigue, SOB and reduced exercise tolerance.  Today, he denies symptoms of palpitations, chest pain, shortness of breath,  lower extremity edema, dizziness, presyncope, syncope, or ICD shocks.  The patient is otherwise without complaint today.   Past Medical History:  Diagnosis Date  . Acute cholecystitis 06/21/2011  . Allergy    SEASONAL  . Cataract   . Complication of anesthesia    HALLUCINATIONS  . GERD (gastroesophageal reflux disease)   . Hearing loss   . Hemorrhoids   . Hiatal hernia   . Hypertrophic cardiomyopathy ([place])   . Mitral regurgitation    Moderate  . Paroxysmal atrial fibrillation ([place]) 04/2015   documented on ICD remote interrogation, treated with sotalol  . Septal defect    LV Septal hyptertrophy 35mm, normal EF, minimal LVOT gradiant.  . Tubular adenoma of colon   . VT (ventricular tachycardia) ([place]) 08/18/2013   s/p St. Jude ICD   Past Surgical History:  Procedure Laterality Date  . APPENDECTOMY  1950  . CARDIOVERSION N/A 02/25/2017   Procedure: CARDIOVERSION;  Surgeon: Fay Records, MD;  Location: [facility];  Service: Cardiovascular;  Laterality: N/A;  . CHOLECYSTECTOMY  06/24/2011  . COLONOSCOPY    . EP IMPLANTABLE DEVICE N/A 09/03/2016   SJM Fortify Assura Dr generator change by Dr Rayann Heman.  ICD implanted for secondary prevention of sudden death  . IMPLANTABLE CARDIOVERTER DEFIBRILLATOR IMPLANT  08/18/2013   St. Jude Fortify Assura DR ICD implanted by Dr Rayann Heman for secondary treatment of VT  . IMPLANTABLE CARDIOVERTER DEFIBRILLATOR IMPLANT N/A 08/18/2013   Procedure: IMPLANTABLE CARDIOVERTER DEFIBRILLATOR IMPLANT;  Surgeon: Coralyn Mark, MD;  Location: [facility]  CATH LAB;  Service: Cardiovascular;  Laterality: N/A;    ROS- all systems are reviewed and negative except as per HPI above  Current Outpatient Medications  Medication Sig Dispense Refill  . acidophilus (RISAQUAD) CAPS capsule Take 1 capsule by mouth daily.    . Calcium Carb-Cholecalciferol (CALCIUM 600+D) 600-800 MG-UNIT TABS Take 1 tablet by mouth daily.    . Cholecalciferol (VITAMIN D3) 25 MCG (1000 UT) CAPS Take 1 capsule by mouth 2 (two) times daily.     . Coenzyme Q10 (CO Q 10) 100 MG CAPS Take 100 mg by mouth daily.     Marland Kitchen ELIQUIS 5 MG TABS tablet TAKE 1 TABLET BY MOUTH TWICE DAILY 60 tablet 6  . glucosamine-chondroitin 500-400 MG tablet Take 1 tablet by mouth daily.    . metoprolol succinate (TOPROL-XL) 25 MG 24 hr tablet Take 0.5 tablets (12.5 mg total) by mouth daily as needed (breakthrough afib). 45 tablet 3  . Omeprazole Magnesium (PRILOSEC OTC PO) Take 10 mg by mouth. Taking 1/2 tablet by mouth daily    . sotalol (BETAPACE) 120 MG tablet TAKE 1 TABLET(120 MG) BY MOUTH TWICE DAILY 180 tablet 1  . Zinc 50 MG CAPS Take 1 capsule by mouth 2 (two) times daily.      No current facility-administered medications for this visit.    Physical Exam: Vitals:   06/12/20 1546  BP: 102/68  Pulse: 66  SpO2: 96%  Weight: 175 lb 9.6 oz (79.7 kg)  Height: 5\' 10"  (1.778 m)    GEN- The patient  is well appearing, alert and oriented x 3 today.   Head- normocephalic, atraumatic Eyes-  Sclera clear, conjunctiva pink Ears- hearing intact Oropharynx- clear Lungs- normal work of breathing Chest- ICD pocket is well healed Heart- irregular rate and rhythm  GI- soft, NT, ND, + BS Extremities- no clubbing, cyanosis, or edema  ICD interrogation- reviewed in detail today,  See PACEART report  ekg from yesterday shows afib with high V pacing rate (120 bpm)  Wt Readings from Last 3 Encounters:  06/12/20 175 lb 9.6 oz (79.7 kg)  06/11/20 173 lb 3.2 oz (78.6 kg)  02/14/20 181 lb 9.6 oz (82.4  kg)   Echo 08/23/19- EF 60%, HCM without obstruction, moderate biatrial enlargement  Assessment and Plan:  1.  HCM with h/o VT Stable on an appropriate medical regimen VT well controlled with sotalol PCP follows labs Normal ICD function See Claudia Desanctis Art report He was V pacing on ekg in the AF clinic yesterday.  I will shorten PVAB to 60 msec to try to prevent this. he is not device dependant today  2. Paroxysmal atrial fibrillation/ atrial flutter afib burden is 4 % (previously 3.8%) The patient has symptomatic, recurrent persistent atrial fibrillation and atrial flutter. he has failed medical therapy with sotalol. Chads2vasc score is 2.  he is anticoagulated with eliquis . Therapeutic strategies for afib including medicine and ablation were discussed in detail with the patient today. Risk, benefits, and alternatives to EP study and radiofrequency ablation for afib were also discussed in detail today. These risks include but are not limited to stroke, bleeding, vascular damage, tamponade, perforation, damage to the esophagus, lungs, and other structures, pulmonary vein stenosis, worsening renal function, and death. The patient understands these risk and wishes to proceed.  We will therefore proceed with catheter ablation at the next available time.  Carto, ICE, anesthesia are requested for the procedure.  Will also obtain cardiac CT prior to the procedure to exclude LAA thrombus and further evaluate atrial anatomy.   Risks, benefits and potential toxicities for medications prescribed and/or refilled reviewed with patient today.   [name], [facility] 06/12/2020 4:06 PM

## 2020-06-12 NOTE — H&P (View-Only) (Signed)
PCP: Marton Redwood, MD Primary Cardiologist: Dr Marlou Porch Primary EP: Dr Salome Spotted is a 74 y.o. male who presents today for routine electrophysiology followup.  Since last being seen in our clinic, the patient reports doing reasonably well.  He has had progression of his afib.  + fatigue, SOB and reduced exercise tolerance.  Today, he denies symptoms of palpitations, chest pain, shortness of breath,  lower extremity edema, dizziness, presyncope, syncope, or ICD shocks.  The patient is otherwise without complaint today.   Past Medical History:  Diagnosis Date  . Acute cholecystitis 06/21/2011  . Allergy    SEASONAL  . Cataract   . Complication of anesthesia    HALLUCINATIONS  . GERD (gastroesophageal reflux disease)   . Hearing loss   . Hemorrhoids   . Hiatal hernia   . Hypertrophic cardiomyopathy ([place])   . Mitral regurgitation    Moderate  . Paroxysmal atrial fibrillation ([place]) 04/2015   documented on ICD remote interrogation, treated with sotalol  . Septal defect    LV Septal hyptertrophy 77mm, normal EF, minimal LVOT gradiant.  . Tubular adenoma of colon   . VT (ventricular tachycardia) ([place]) 08/18/2013   s/p St. Jude ICD   Past Surgical History:  Procedure Laterality Date  . APPENDECTOMY  1950  . CARDIOVERSION N/A 02/25/2017   Procedure: CARDIOVERSION;  Surgeon: Fay Records, MD;  Location: [facility];  Service: Cardiovascular;  Laterality: N/A;  . CHOLECYSTECTOMY  06/24/2011  . COLONOSCOPY    . EP IMPLANTABLE DEVICE N/A 09/03/2016   SJM Fortify Assura Dr generator change by Dr Rayann Heman.  ICD implanted for secondary prevention of sudden death  . IMPLANTABLE CARDIOVERTER DEFIBRILLATOR IMPLANT  08/18/2013   St. Jude Fortify Assura DR ICD implanted by Dr Rayann Heman for secondary treatment of VT  . IMPLANTABLE CARDIOVERTER DEFIBRILLATOR IMPLANT N/A 08/18/2013   Procedure: IMPLANTABLE CARDIOVERTER DEFIBRILLATOR IMPLANT;  Surgeon: Coralyn Mark, MD;  Location: [facility]  CATH LAB;  Service: Cardiovascular;  Laterality: N/A;    ROS- all systems are reviewed and negative except as per HPI above  Current Outpatient Medications  Medication Sig Dispense Refill  . acidophilus (RISAQUAD) CAPS capsule Take 1 capsule by mouth daily.    . Calcium Carb-Cholecalciferol (CALCIUM 600+D) 600-800 MG-UNIT TABS Take 1 tablet by mouth daily.    . Cholecalciferol (VITAMIN D3) 25 MCG (1000 UT) CAPS Take 1 capsule by mouth 2 (two) times daily.     . Coenzyme Q10 (CO Q 10) 100 MG CAPS Take 100 mg by mouth daily.     Marland Kitchen ELIQUIS 5 MG TABS tablet TAKE 1 TABLET BY MOUTH TWICE DAILY 60 tablet 6  . glucosamine-chondroitin 500-400 MG tablet Take 1 tablet by mouth daily.    . metoprolol succinate (TOPROL-XL) 25 MG 24 hr tablet Take 0.5 tablets (12.5 mg total) by mouth daily as needed (breakthrough afib). 45 tablet 3  . Omeprazole Magnesium (PRILOSEC OTC PO) Take 10 mg by mouth. Taking 1/2 tablet by mouth daily    . sotalol (BETAPACE) 120 MG tablet TAKE 1 TABLET(120 MG) BY MOUTH TWICE DAILY 180 tablet 1  . Zinc 50 MG CAPS Take 1 capsule by mouth 2 (two) times daily.      No current facility-administered medications for this visit.    Physical Exam: Vitals:   06/12/20 1546  BP: 102/68  Pulse: 66  SpO2: 96%  Weight: 175 lb 9.6 oz (79.7 kg)  Height: 5\' 10"  (1.778 m)    GEN- The patient  is well appearing, alert and oriented x 3 today.   Head- normocephalic, atraumatic Eyes-  Sclera clear, conjunctiva pink Ears- hearing intact Oropharynx- clear Lungs- normal work of breathing Chest- ICD pocket is well healed Heart- irregular rate and rhythm  GI- soft, NT, ND, + BS Extremities- no clubbing, cyanosis, or edema  ICD interrogation- reviewed in detail today,  See PACEART report  ekg from yesterday shows afib with high V pacing rate (120 bpm)  Wt Readings from Last 3 Encounters:  06/12/20 175 lb 9.6 oz (79.7 kg)  06/11/20 173 lb 3.2 oz (78.6 kg)  02/14/20 181 lb 9.6 oz (82.4  kg)   Echo 08/23/19- EF 60%, HCM without obstruction, moderate biatrial enlargement  Assessment and Plan:  1.  HCM with h/o VT Stable on an appropriate medical regimen VT well controlled with sotalol PCP follows labs Normal ICD function See Claudia Desanctis Art report He was V pacing on ekg in the AF clinic yesterday.  I will shorten PVAB to 60 msec to try to prevent this. he is not device dependant today  2. Paroxysmal atrial fibrillation/ atrial flutter afib burden is 4 % (previously 3.8%) The patient has symptomatic, recurrent persistent atrial fibrillation and atrial flutter. he has failed medical therapy with sotalol. Chads2vasc score is 2.  he is anticoagulated with eliquis . Therapeutic strategies for afib including medicine and ablation were discussed in detail with the patient today. Risk, benefits, and alternatives to EP study and radiofrequency ablation for afib were also discussed in detail today. These risks include but are not limited to stroke, bleeding, vascular damage, tamponade, perforation, damage to the esophagus, lungs, and other structures, pulmonary vein stenosis, worsening renal function, and death. The patient understands these risk and wishes to proceed.  We will therefore proceed with catheter ablation at the next available time.  Carto, ICE, anesthesia are requested for the procedure.  Will also obtain cardiac CT prior to the procedure to exclude LAA thrombus and further evaluate atrial anatomy.   Risks, benefits and potential toxicities for medications prescribed and/or refilled reviewed with patient today.   [name], [facility] 06/12/2020 4:06 PM

## 2020-06-13 ENCOUNTER — Encounter: Payer: Self-pay | Admitting: *Deleted

## 2020-06-13 LAB — BASIC METABOLIC PANEL
BUN/Creatinine Ratio: 16 (ref 10–24)
BUN: 21 mg/dL (ref 8–27)
CO2: 26 mmol/L (ref 20–29)
Calcium: 9.4 mg/dL (ref 8.6–10.2)
Chloride: 104 mmol/L (ref 96–106)
Creatinine, Ser: 1.28 mg/dL — ABNORMAL HIGH (ref 0.76–1.27)
GFR calc Af Amer: 63 mL/min/{1.73_m2} (ref >59)
GFR calc non Af Amer: 54 mL/min/{1.73_m2} — ABNORMAL LOW (ref >59)
Glucose: 78 mg/dL (ref 65–99)
Potassium: 4.3 mmol/L (ref 3.5–5.2)
Sodium: 141 mmol/L (ref 134–144)

## 2020-06-13 LAB — CBC WITH DIFFERENTIAL/PLATELET
Basophils Absolute: 0 10*3/uL (ref 0.0–0.2)
Basos: 1 %
EOS (ABSOLUTE): 0.2 10*3/uL (ref 0.0–0.4)
Eos: 2 %
Hematocrit: 47.2 % (ref 37.5–51.0)
Hemoglobin: 15.8 g/dL (ref 13.0–17.7)
Immature Grans (Abs): 0 10*3/uL (ref 0.0–0.1)
Immature Granulocytes: 0 %
Lymphocytes Absolute: 2.1 10*3/uL (ref 0.7–3.1)
Lymphs: 30 %
MCH: 29.5 pg (ref 26.6–33.0)
MCHC: 33.5 g/dL (ref 31.5–35.7)
MCV: 88 fL (ref 79–97)
Monocytes Absolute: 0.6 10*3/uL (ref 0.1–0.9)
Monocytes: 8 %
Neutrophils Absolute: 4 10*3/uL (ref 1.4–7.0)
Neutrophils: 59 %
Platelets: 196 10*3/uL (ref 150–450)
RBC: 5.35 x10E6/uL (ref 4.14–5.80)
RDW: 13.1 % (ref 11.6–15.4)
WBC: 6.9 10*3/uL (ref 3.4–10.8)

## 2020-06-17 NOTE — Progress Notes (Signed)
Remote ICD transmission.   

## 2020-06-25 DIAGNOSIS — M47896 Other spondylosis, lumbar region: Secondary | ICD-10-CM | POA: Diagnosis not present

## 2020-06-25 DIAGNOSIS — M545 Low back pain, unspecified: Secondary | ICD-10-CM | POA: Diagnosis not present

## 2020-06-25 DIAGNOSIS — M47816 Spondylosis without myelopathy or radiculopathy, lumbar region: Secondary | ICD-10-CM | POA: Diagnosis not present

## 2020-06-27 ENCOUNTER — Telehealth (HOSPITAL_COMMUNITY): Payer: Self-pay | Admitting: *Deleted

## 2020-06-27 NOTE — Telephone Encounter (Signed)

## 2020-06-28 ENCOUNTER — Encounter (HOSPITAL_COMMUNITY): Payer: Self-pay

## 2020-06-28 ENCOUNTER — Ambulatory Visit (HOSPITAL_COMMUNITY)
Admission: RE | Admit: 2020-06-28 | Discharge: 2020-06-28 | Disposition: A | Discharge status: Home / Self Care | Payer: Medicare Other | Source: Ambulatory Visit | Attending: Internal Medicine | Admitting: Internal Medicine

## 2020-06-28 ENCOUNTER — Other Ambulatory Visit: Payer: Self-pay

## 2020-06-28 DIAGNOSIS — I4891 Unspecified atrial fibrillation: Secondary | ICD-10-CM | POA: Insufficient documentation

## 2020-06-28 MED ORDER — SOTALOL HCL 120 MG PO TABS: ORAL_TABLET | ORAL | 1 refills | Status: DC

## 2020-06-28 MED ORDER — IOHEXOL 350 MG/ML SOLN
80.0000 mL | Freq: Once | INTRAVENOUS | Status: AC | PRN
  Administered 2020-06-28: 80 mL via INTRAVENOUS

## 2020-06-28 NOTE — Discharge Instructions (Signed)
Cardiac CT Angiogram A cardiac CT angiogram is a procedure to look at the heart and the area around the heart. It may be done to help find the cause of chest pains or other symptoms of heart disease. During this procedure, a substance called contrast dye is injected into the blood vessels in the area to be checked. A large X-ray machine, called a CT scanner, then takes detailed pictures of the heart and the surrounding area. The procedure is also sometimes called a coronary CT angiogram, coronary artery scanning, or CTA. A cardiac CT angiogram allows the health care provider to see how well blood is flowing to and from the heart. The health care provider will be able to see if there are any problems, such as:  Blockage or narrowing of the coronary arteries in the heart.  Fluid around the heart.  Signs of weakness or disease in the muscles, valves, and tissues of the heart. Tell a health care provider about:  Any allergies you have. This is especially important if you have had a previous allergic reaction to contrast dye.  All medicines you are taking, including vitamins, herbs, eye drops, creams, and over-the-counter medicines.  Any blood disorders you have.  Any surgeries you have had.  Any medical conditions you have.  Whether you are pregnant or may be pregnant.  Any anxiety disorders, chronic pain, or other conditions you have that may increase your stress or prevent you from lying still. What are the risks? Generally, this is a safe procedure. However, problems may occur, including:  Bleeding.  Infection.  Allergic reactions to medicines or dyes.  Damage to other structures or organs.  Kidney damage from the contrast dye that is used.  Increased risk of cancer from radiation exposure. This risk is low. Talk with your health care provider about: ? The risks and benefits of testing. ? How you can receive the lowest dose of radiation. What happens before the  procedure?  Wear comfortable clothing and remove any jewelry, glasses, dentures, and hearing aids.  Follow instructions from your health care provider about eating and drinking. This may include: ? For 12 hours before the procedure -- avoid caffeine. This includes tea, coffee, soda, energy drinks, and diet pills. Drink plenty of water or other fluids that do not have caffeine in them. Being well hydrated can prevent complications. ? For 4-6 hours before the procedure -- stop eating and drinking. The contrast dye can cause nausea, but this is less likely if your stomach is empty.  Ask your health care provider about changing or stopping your regular medicines. This is especially important if you are taking diabetes medicines, blood thinners, or medicines to treat problems with erections (erectile dysfunction). What happens during the procedure?   Hair on your chest may need to be removed so that small sticky patches called electrodes can be placed on your chest. These will transmit information that helps to monitor your heart during the procedure.  An IV will be inserted into one of your veins.  You might be given a medicine to control your heart rate during the procedure. This will help to ensure that good images are obtained.  You will be asked to lie on an exam table. This table will slide in and out of the CT machine during the procedure.  Contrast dye will be injected into the IV. You might feel warm, or you may get a metallic taste in your mouth.  You will be given a medicine called   nitroglycerin. This will relax or dilate the arteries in your heart.  The table that you are lying on will move into the CT machine tunnel for the scan.  The person running the machine will give you instructions while the scans are being done. You may be asked to: ? Keep your arms above your head. ? Hold your breath. ? Stay very still, even if the table is moving.  When the scanning is complete, you  will be moved out of the machine.  The IV will be removed. The procedure may vary among health care providers and hospitals. What can I expect after the procedure? After your procedure, it is common to have:  A metallic taste in your mouth from the contrast dye.  A feeling of warmth.  A headache from the nitroglycerin. Follow these instructions at home:  Take over-the-counter and prescription medicines only as told by your health care provider.  If you are told, drink enough fluid to keep your urine pale yellow. This will help to flush the contrast dye out of your body.  Most people can return to their normal activities right after the procedure. Ask your health care provider what activities are safe for you.  It is up to you to get the results of your procedure. Ask your health care provider, or the department that is doing the procedure, when your results will be ready.  Keep all follow-up visits as told by your health care provider. This is important. Contact a health care provider if:  You have any symptoms of allergy to the contrast dye. These include: ? Shortness of breath. ? Rash or hives. ? A racing heartbeat. Summary  A cardiac CT angiogram is a procedure to look at the heart and the area around the heart. It may be done to help find the cause of chest pains or other symptoms of heart disease.  During this procedure, a large X-ray machine, called a CT scanner, takes detailed pictures of the heart and the surrounding area after a contrast dye has been injected into blood vessels in the area.  Ask your health care provider about changing or stopping your regular medicines before the procedure. This is especially important if you are taking diabetes medicines, blood thinners, or medicines to treat erectile dysfunction.  If you are told, drink enough fluid to keep your urine pale yellow. This will help to flush the contrast dye out of your body. This information is not  intended to replace advice given to you by your health care provider. Make sure you discuss any questions you have with your health care provider. Document Revised: 04/26/2019 Document Reviewed: 04/26/2019 Elsevier Patient Education  2020 Elsevier Inc[employer] Testing With IV Contrast Material IV contrast material is a fluid that is used with some imaging tests. It is injected into your body through a vein. Contrast material is used when your health care providers need a detailed look at organs, tissues, or blood vessels that may not show up with the standard test. The material may be used when an X-ray, an MRI, a CT scan, or an ultrasound is done. IV contrast material may be used for imaging tests that check:  Muscles, skin, and fat.  Breasts.  Brain.  Digestive tract.  Heart.  Organs such as the liver, kidneys, lungs, bladder, and many others.  Arteries and veins. Tell a health care provider about:  Any allergies you have, especially an allergy to contrast material.  All medicines you are taking, including   metformin, beta blockers, NSAIDs (such as ibuprofen), interleukin-2, vitamins, herbs, eye drops, creams, and over-the-counter medicines.  Any problems you or family members have had with the use of contrast material.  Any blood disorders you have, such as sickle cell anemia.  Any surgeries you have had.  Any medical conditions you have or have had, especially alcohol abuse, dehydration, asthma, or kidney, liver, or heart problems.  Whether you are pregnant or may be pregnant.  Whether you are breastfeeding. Most contrast materials are safe for use in breastfeeding women. What are the risks? Generally, this is a safe procedure. However, problems may occur, including:  Headache.  Itching, skin rash, and hives.  Nausea and vomiting.  Allergic reactions.  Wheezing or difficulty breathing.  Abnormal heart rate.  Changes in blood pressure.  Throat swelling.  Kidney  damage. What happens before the procedure? Medicines Ask your health care provider about:  Changing or stopping your regular medicines. This is especially important if you are taking diabetes medicines or blood thinners.  Taking medicines such as aspirin and ibuprofen. These medicines can thin your blood. Do not take these medicines unless your health care provider tells you to take them.  Taking over-the-counter medicines, vitamins, herbs, and supplements. If you are at risk of having a reaction to the IV contrast material, you may be asked to take medicine before the procedure to prevent a reaction. General instructions  Follow instructions from your health care provider about eating or drinking restrictions.  You may have an exam or lab tests to make sure that you can safely get IV contrast material.  Ask if you will be given a medicine to help you relax (sedative) during the procedure. If so, plan to have someone take you home from the hospital or clinic. What happens during the procedure?  You may be given a sedative to help you relax.  An IV will be inserted into one of your veins.  Contrast material will be injected into your IV.  You may feel warmth or flushing as the contrast material enters your bloodstream.  You may have a metallic taste in your mouth for a few minutes.  The needle may cause some discomfort and bruising.  After the contrast material is in your body, the imaging test will be done. The procedure may vary among health care providers and hospitals. What can I expect after the procedure?  The IV will be removed.  You may be taken to a recovery area if sedation medicines were used. Your blood pressure, heart rate, breathing rate, and blood oxygen level will be monitored until you leave the hospital or clinic. Follow these instructions at home:   Take over-the-counter and prescription medicines only as told by your health care provider. ? Your health  care provider may tell you to not take certain medicines for a couple of days after the procedure. This is especially important if you are taking diabetes medicines.  If you are told, drink enough fluid to keep your urine pale yellow. This will help to remove the contrast material out of your body.  Do not drive for 24 hours if you were given a sedative during your procedure.  It is up to you to get the results of your procedure. Ask your health care provider, or the department that is doing the procedure, when your results will be ready.  Keep all follow-up visits as told by your health care provider. This is important. Contact a health care provider if:    You have redness, swelling, or pain near your IV site. Get help right away if:  You have an abnormal heart rhythm.  You have trouble breathing.  You have: ? Chest pain. ? Pain in your back, neck, arm, jaw, or stomach. ? Nausea or sweating. ? Hives or a rash.  You start shaking and cannot stop. These symptoms may represent a serious problem that is an emergency. Do not wait to see if the symptoms will go away. Get medical help right away. Call your local emergency services (911 in the [place] Do not drive yourself to the hospital. Summary  IV contrast material may be used for imaging tests to help your health care providers see your organs and tissues more clearly.  Tell your health care provider if you are pregnant or may be pregnant.  During the procedure, you may feel warmth or flushing as the contrast material enters your bloodstream.  After the procedure, drink enough fluid to keep your urine pale yellow. This information is not intended to replace advice given to you by your health care provider. Make sure you discuss any questions you have with your health care provider. Document Revised: 11/17/2018 Document Reviewed: 11/17/2018 Elsevier Patient Education  2020 Elsevier Inc.  

## 2020-07-02 DIAGNOSIS — M859 Disorder of bone density and structure, unspecified: Secondary | ICD-10-CM | POA: Diagnosis not present

## 2020-07-02 DIAGNOSIS — Z125 Encounter for screening for malignant neoplasm of prostate: Secondary | ICD-10-CM | POA: Diagnosis not present

## 2020-07-02 DIAGNOSIS — E781 Pure hyperglyceridemia: Secondary | ICD-10-CM | POA: Diagnosis not present

## 2020-07-02 DIAGNOSIS — R7301 Impaired fasting glucose: Secondary | ICD-10-CM | POA: Diagnosis not present

## 2020-07-03 ENCOUNTER — Other Ambulatory Visit (HOSPITAL_COMMUNITY)
Admission: RE | Admit: 2020-07-03 | Discharge: 2020-07-03 | Disposition: A | Discharge status: Home / Self Care | Payer: Medicare Other | Source: Ambulatory Visit | Attending: Internal Medicine | Admitting: Internal Medicine

## 2020-07-03 ENCOUNTER — Encounter: Payer: Self-pay | Admitting: *Deleted

## 2020-07-03 DIAGNOSIS — Z20822 Contact with and (suspected) exposure to covid-19: Secondary | ICD-10-CM | POA: Diagnosis not present

## 2020-07-03 DIAGNOSIS — Z01812 Encounter for preprocedural laboratory examination: Secondary | ICD-10-CM | POA: Insufficient documentation

## 2020-07-03 LAB — SARS CORONAVIRUS 2 (TAT 6-24 HRS): SARS Coronavirus 2: NEGATIVE

## 2020-07-03 NOTE — Progress Notes (Signed)
Instructed patient on the following items: Arrival time 0830 Nothing to eat or drink after midnight No meds AM of procedure Responsible person to drive you home and stay with you for 24 hrs  Have you missed any doses of anti-coagulant patient is on Eliquis- hasn't missed any doses, will not take any medications in the morning.

## 2020-07-03 NOTE — Telephone Encounter (Signed)
Resent instruction letter over [place] and assisted the patient with finding it in [place]; then we went over it together.   All questions were answered and he verbalized understanding.

## 2020-07-04 ENCOUNTER — Encounter (HOSPITAL_COMMUNITY)
Admission: RE | Disposition: A | Discharge status: Home / Self Care | Payer: Self-pay | Source: Ambulatory Visit | Attending: Internal Medicine

## 2020-07-04 ENCOUNTER — Ambulatory Visit (HOSPITAL_COMMUNITY): Payer: Medicare Other | Admitting: Anesthesiology

## 2020-07-04 ENCOUNTER — Encounter (HOSPITAL_COMMUNITY): Payer: Self-pay | Admitting: Internal Medicine

## 2020-07-04 ENCOUNTER — Ambulatory Visit (HOSPITAL_COMMUNITY)
Admission: RE | Admit: 2020-07-04 | Discharge: 2020-07-04 | Disposition: A | Discharge status: Home / Self Care | Payer: Medicare Other | Source: Ambulatory Visit | Attending: Internal Medicine | Admitting: Internal Medicine

## 2020-07-04 DIAGNOSIS — I472 Ventricular tachycardia: Secondary | ICD-10-CM | POA: Diagnosis not present

## 2020-07-04 DIAGNOSIS — Z95810 Presence of automatic (implantable) cardiac defibrillator: Secondary | ICD-10-CM | POA: Diagnosis not present

## 2020-07-04 DIAGNOSIS — Z79899 Other long term (current) drug therapy: Secondary | ICD-10-CM | POA: Diagnosis not present

## 2020-07-04 DIAGNOSIS — Z9049 Acquired absence of other specified parts of digestive tract: Secondary | ICD-10-CM | POA: Insufficient documentation

## 2020-07-04 DIAGNOSIS — I422 Other hypertrophic cardiomyopathy: Secondary | ICD-10-CM | POA: Diagnosis not present

## 2020-07-04 DIAGNOSIS — K219 Gastro-esophageal reflux disease without esophagitis: Secondary | ICD-10-CM | POA: Insufficient documentation

## 2020-07-04 DIAGNOSIS — I340 Nonrheumatic mitral (valve) insufficiency: Secondary | ICD-10-CM | POA: Diagnosis not present

## 2020-07-04 DIAGNOSIS — I480 Paroxysmal atrial fibrillation: Secondary | ICD-10-CM | POA: Diagnosis not present

## 2020-07-04 DIAGNOSIS — I484 Atypical atrial flutter: Secondary | ICD-10-CM | POA: Diagnosis not present

## 2020-07-04 DIAGNOSIS — Z7901 Long term (current) use of anticoagulants: Secondary | ICD-10-CM | POA: Insufficient documentation

## 2020-07-04 DIAGNOSIS — Z86010 Personal history of colonic polyps: Secondary | ICD-10-CM | POA: Insufficient documentation

## 2020-07-04 DIAGNOSIS — I4819 Other persistent atrial fibrillation: Secondary | ICD-10-CM | POA: Diagnosis not present

## 2020-07-04 DIAGNOSIS — H9190 Unspecified hearing loss, unspecified ear: Secondary | ICD-10-CM | POA: Insufficient documentation

## 2020-07-04 DIAGNOSIS — I483 Typical atrial flutter: Secondary | ICD-10-CM | POA: Diagnosis not present

## 2020-07-04 HISTORY — PX: ATRIAL FIBRILLATION ABLATION: EP1191

## 2020-07-04 LAB — POCT ACTIVATED CLOTTING TIME
Activated Clotting Time: 279 seconds
Activated Clotting Time: 345 seconds

## 2020-07-04 SURGERY — ATRIAL FIBRILLATION ABLATION
Anesthesia: General

## 2020-07-04 MED ORDER — DEXAMETHASONE SODIUM PHOSPHATE 10 MG/ML IJ SOLN
INTRAMUSCULAR | Status: DC | PRN
  Administered 2020-07-04: 10 mg via INTRAVENOUS

## 2020-07-04 MED ORDER — PROPOFOL 10 MG/ML IV BOLUS
INTRAVENOUS | Status: DC | PRN
  Administered 2020-07-04: 160 mg via INTRAVENOUS

## 2020-07-04 MED ORDER — ROCURONIUM BROMIDE 10 MG/ML (PF) SYRINGE
PREFILLED_SYRINGE | INTRAVENOUS | Status: DC | PRN
  Administered 2020-07-04: 100 mg via INTRAVENOUS

## 2020-07-04 MED ORDER — PROTAMINE SULFATE 10 MG/ML IV SOLN
INTRAVENOUS | Status: DC | PRN
  Administered 2020-07-04 (×2): 20 mg via INTRAVENOUS

## 2020-07-04 MED ORDER — PHENYLEPHRINE 40 MCG/ML (10ML) SYRINGE FOR IV PUSH (FOR BLOOD PRESSURE SUPPORT)
PREFILLED_SYRINGE | INTRAVENOUS | Status: DC | PRN
  Administered 2020-07-04 (×2): 80 ug via INTRAVENOUS

## 2020-07-04 MED ORDER — HEPARIN SODIUM (PORCINE) 1000 UNIT/ML IJ SOLN
INTRAMUSCULAR | Status: DC | PRN
  Administered 2020-07-04: 1000 [IU] via INTRAVENOUS
  Administered 2020-07-04: 14000 [IU] via INTRAVENOUS

## 2020-07-04 MED ORDER — HEPARIN (PORCINE) IN NACL 1000-0.9 UT/500ML-% IV SOLN
INTRAVENOUS | Status: DC | PRN
  Administered 2020-07-04: 500 mL

## 2020-07-04 MED ORDER — ONDANSETRON HCL 4 MG/2ML IJ SOLN
INTRAMUSCULAR | Status: DC | PRN
  Administered 2020-07-04: 4 mg via INTRAVENOUS

## 2020-07-04 MED ORDER — HEPARIN SODIUM (PORCINE) 1000 UNIT/ML IJ SOLN
INTRAMUSCULAR | Status: AC
  Filled 2020-07-04: qty 2

## 2020-07-04 MED ORDER — LIDOCAINE 2% (20 MG/ML) 5 ML SYRINGE
INTRAMUSCULAR | Status: DC | PRN
  Administered 2020-07-04: 100 mg via INTRAVENOUS

## 2020-07-04 MED ORDER — SODIUM CHLORIDE 0.9 % IV SOLN: 250.0000 mL | INTRAVENOUS | Status: DC | PRN

## 2020-07-04 MED ORDER — SODIUM CHLORIDE 0.9% FLUSH: 3.0000 mL | INTRAVENOUS | Status: DC | PRN

## 2020-07-04 MED ORDER — ACETAMINOPHEN 325 MG PO TABS: 650.0000 mg | ORAL_TABLET | ORAL | Status: DC | PRN

## 2020-07-04 MED ORDER — EPHEDRINE SULFATE-NACL 50-0.9 MG/10ML-% IV SOSY
PREFILLED_SYRINGE | INTRAVENOUS | Status: DC | PRN
  Administered 2020-07-04: 5 mg via INTRAVENOUS
  Administered 2020-07-04: 10 mg via INTRAVENOUS
  Administered 2020-07-04 (×2): 5 mg via INTRAVENOUS
  Administered 2020-07-04: 10 mg via INTRAVENOUS

## 2020-07-04 MED ORDER — HEPARIN SODIUM (PORCINE) 1000 UNIT/ML IJ SOLN
INTRAMUSCULAR | Status: DC | PRN
  Administered 2020-07-04: 5000 [IU] via INTRAVENOUS
  Administered 2020-07-04: 1000 [IU] via INTRAVENOUS

## 2020-07-04 MED ORDER — SODIUM CHLORIDE 0.9% FLUSH: 3.0000 mL | Freq: Two times a day (BID) | INTRAVENOUS | Status: DC

## 2020-07-04 MED ORDER — HYDROCODONE-ACETAMINOPHEN 5-325 MG PO TABS: 1.0000 | ORAL_TABLET | ORAL | Status: DC | PRN

## 2020-07-04 MED ORDER — ONDANSETRON HCL 4 MG/2ML IJ SOLN
4.0000 mg | Freq: Four times a day (QID) | INTRAMUSCULAR | Status: DC | PRN
  Administered 2020-07-04: 4 mg via INTRAVENOUS

## 2020-07-04 MED ORDER — FENTANYL CITRATE (PF) 100 MCG/2ML IJ SOLN
INTRAMUSCULAR | Status: DC | PRN
Start: 2020-07-04 — End: 2020-07-04
  Administered 2020-07-04 (×2): 50 ug via INTRAVENOUS

## 2020-07-04 MED ORDER — ONDANSETRON HCL 4 MG/2ML IJ SOLN
INTRAMUSCULAR | Status: AC
  Filled 2020-07-04: qty 2

## 2020-07-04 MED ORDER — SODIUM CHLORIDE 0.9 % IV SOLN: INTRAVENOUS | Status: DC

## 2020-07-04 MED ORDER — SUGAMMADEX SODIUM 200 MG/2ML IV SOLN
INTRAVENOUS | Status: DC | PRN
  Administered 2020-07-04: 180 mg via INTRAVENOUS

## 2020-07-04 SURGICAL SUPPLY — 23 items
BLANKET WARM UNDERBOD FULL ACC (MISCELLANEOUS) ×2 IMPLANT
CATH 8FR REPROCESSED SOUNDSTAR (CATHETERS) ×2 IMPLANT
CATH 8FR SOUNDSTAR REPROCESSED (CATHETERS) IMPLANT
CATH MAPPNG PENTARAY F 2-6-2MM (CATHETERS) IMPLANT
CATH SMTCH THERMOCOOL SF DF (CATHETERS) ×1 IMPLANT
CATH WEBSTER BI DIR CS D-F CRV (CATHETERS) ×1 IMPLANT
CLOSURE PERCLOSE PROSTYLE (VASCULAR PRODUCTS) ×3 IMPLANT
COVER DOME SNAP 22 D (MISCELLANEOUS) ×1 IMPLANT
COVER SWIFTLINK CONNECTOR (BAG) ×2 IMPLANT
NDL BAYLIS TRANSSEPTAL 71CM (NEEDLE) IMPLANT
NEEDLE BAYLIS TRANSSEPTAL 71CM (NEEDLE) ×2 IMPLANT
PACK EP LATEX FREE (CUSTOM PROCEDURE TRAY) ×2
PACK EP LF (CUSTOM PROCEDURE TRAY) ×1 IMPLANT
PAD PRO RADIOLUCENT 2001M-C (PAD) ×2 IMPLANT
PATCH CARTO3 (PAD) ×1 IMPLANT
PENTARAY F 2-6-2MM (CATHETERS) ×2
PROTECTION STATION PRESSURIZED (MISCELLANEOUS) ×2
SHEATH PINNACLE 7F 10CM (SHEATH) ×2 IMPLANT
SHEATH PINNACLE 9F 10CM (SHEATH) ×1 IMPLANT
SHEATH PROBE COVER 6X72 (BAG) ×1 IMPLANT
SHEATH SWARTZ TS SL2 63CM 8.5F (SHEATH) ×1 IMPLANT
STATION PROTECTION PRESSURIZED (MISCELLANEOUS) IMPLANT
TUBING SMART ABLATE COOLFLOW (TUBING) ×1 IMPLANT

## 2020-07-04 NOTE — Anesthesia Procedure Notes (Signed)
Procedure Name: Intubation Date/Time: 07/04/2020 11:04 AM Performed by: Jenne Campus, CRNA Pre-anesthesia Checklist: Patient identified, Emergency Drugs available, Suction available and Patient being monitored Patient Re-evaluated:Patient Re-evaluated prior to induction Oxygen Delivery Method: Circle System Utilized Preoxygenation: Pre-oxygenation with 100% oxygen Induction Type: IV induction Ventilation: Mask ventilation without difficulty Laryngoscope Size: Miller and 3 Grade View: Grade I Tube type: Oral Tube size: 7.5 mm Number of attempts: 1 Airway Equipment and Method: Stylet Placement Confirmation: ETT inserted through vocal cords under direct vision,  positive ETCO2 and breath sounds checked- equal and bilateral Secured at: 21 cm Tube secured with: Tape Dental Injury: Teeth and Oropharynx as per pre-operative assessment

## 2020-07-04 NOTE — Transfer of Care (Signed)
Immediate Anesthesia Transfer of Care Note  Patient: Joseph Morrow  Procedure(s) Performed: ATRIAL FIBRILLATION ABLATION (N/A )  Patient Location: PACU  Anesthesia Type:General  Level of Consciousness: oriented, drowsy and patient cooperative  Airway & Oxygen Therapy: Patient Spontanous Breathing and Patient connected to nasal cannula oxygen  Post-op Assessment: Report given to RN and Post -op Vital signs reviewed and stable  Post vital signs: Reviewed  Last Vitals:  Vitals Value Taken Time  BP 137/123 07/04/20 1407  Temp 36.5 C 07/04/20 1350  Pulse 107 07/04/20 1409  Resp    SpO2 97 % 07/04/20 1409  Vitals shown include unvalidated device data.  Last Pain:  Vitals:   07/04/20 1350  TempSrc: Temporal  PainSc:       Patients Stated Pain Goal: 4 (70/78/67 5449)  Complications: No complications documented.

## 2020-07-04 NOTE — Discharge Instructions (Signed)

## 2020-07-04 NOTE — Interval H&P Note (Signed)
History and Physical Interval Note:  07/04/2020 10:31 AM  Joseph Morrow  has presented today for surgery, with the diagnosis of afib.  The various methods of treatment have been discussed with the patient and family. After consideration of risks, benefits and other options for treatment, the patient has consented to  Procedure(s): ATRIAL FIBRILLATION ABLATION (N/A) as a surgical intervention.  The patient's history has been reviewed, patient examined, no change in status, stable for surgery.  I have reviewed the patient's chart and labs.  Questions were answered to the patient's satisfaction.     Cardiac CT reviewed with him at length today.  Reports compliance with eliquis without interruption.  [name]

## 2020-07-04 NOTE — Anesthesia Preprocedure Evaluation (Addendum)
Anesthesia Evaluation  Patient identified by MRN, date of birth, ID band Patient awake    Reviewed: Allergy & Precautions, NPO status , Patient's Chart, lab work & pertinent test results, reviewed documented beta blocker date and time   History of Anesthesia Complications Negative for: history of anesthetic complications  Airway Mallampati: II  TM Distance: >3 FB Neck ROM: Full    Dental no notable dental hx. (+) Dental Advisory Given   Pulmonary neg pulmonary ROS, former smoker,    Pulmonary exam normal        Cardiovascular Normal cardiovascular exam+ dysrhythmias Atrial Fibrillation and Ventricular Tachycardia + Cardiac Defibrillator + Valvular Problems/Murmurs MR   IMPRESSIONS    1. Hypertrophic cardiomyopathy, sigmoid subtype. Basal septum measures 18  mm in maximal dimension. Flow acceleration noted at the LVOT, no LVOT  obstruction noted. Chordal systolic anterior motion of the mitral valve  without involvment of the anterior  leaflet.  2. Left ventricular ejection fraction, by visual estimation, is 60 to  65%. The left ventricle has normal function. There is severely increased  left ventricular hypertrophy.  3. Elevated left atrial and left ventricular end-diastolic pressures.  4. Left ventricular diastolic parameters are consistent with Grade III  diastolic dysfunction (restrictive).  5. Global right ventricle has normal systolic function.The right  ventricular size is normal. No increase in right ventricular wall  thickness.  6. Left atrial size was moderately dilated.  7. Right atrial size was moderately dilated.  8. The mitral valve is normal in structure. Mild mitral valve  regurgitation. No evidence of mitral stenosis.  9. The tricuspid valve is normal in structure. Tricuspid valve  regurgitation is mild.  10. The aortic valve is tricuspid. Aortic valve regurgitation is not  visualized. No evidence  of aortic valve sclerosis or stenosis.  11. The pulmonic valve was normal in structure. Pulmonic valve  regurgitation is mild.  12. Moderately elevated pulmonary artery systolic pressure.  13. The inferior vena cava is dilated in size with <50% respiratory  variability, suggesting right atrial pressure of 15 mmHg.  14. The left ventricle has no regional wall motion abnormalities.    Neuro/Psych negative neurological ROS     GI/Hepatic Neg liver ROS, hiatal hernia, GERD  ,  Endo/Other  negative endocrine ROS  Renal/GU negative Renal ROS     Musculoskeletal negative musculoskeletal ROS (+)   Abdominal   Peds  Hematology negative hematology ROS (+)   Anesthesia Other Findings   Reproductive/Obstetrics                         Anesthesia Physical Anesthesia Plan  ASA: III  Anesthesia Plan: General   Post-op Pain Management:    Induction: Intravenous  PONV Risk Score and Plan: 2 and Ondansetron and Dexamethasone  Airway Management Planned: Oral ETT  Additional Equipment:   Intra-op Plan:   Post-operative Plan: Extubation in OR  Informed Consent: I have reviewed the patients History and Physical, chart, labs and discussed the procedure including the risks, benefits and alternatives for the proposed anesthesia with the patient or authorized representative who has indicated his/her understanding and acceptance.     Dental advisory given  Plan Discussed with: Anesthesiologist and CRNA  Anesthesia Plan Comments:        Anesthesia Quick Evaluation

## 2020-07-04 NOTE — Anesthesia Postprocedure Evaluation (Signed)
Anesthesia Post Note  Patient: Joseph Morrow  Procedure(s) Performed: ATRIAL FIBRILLATION ABLATION (N/A )     Patient location during evaluation: Cath Lab Anesthesia Type: General Level of consciousness: awake and alert Pain management: pain level controlled Vital Signs Assessment: post-procedure vital signs reviewed and stable Respiratory status: spontaneous breathing, nonlabored ventilation, respiratory function stable and patient connected to nasal cannula oxygen Cardiovascular status: blood pressure returned to baseline and stable Postop Assessment: no apparent nausea or vomiting Anesthetic complications: no   No complications documented.  Last Vitals:  Vitals:   07/04/20 1516 07/04/20 1532  BP: 110/83 106/78  Pulse: (!) 103 (!) 104  Resp: (!) 26 19  Temp:    SpO2: 97% 96%    Last Pain:  Vitals:   07/04/20 1446  TempSrc:   PainSc: 0-No pain                 Artyom Stencel COKER

## 2020-07-05 ENCOUNTER — Encounter (HOSPITAL_COMMUNITY): Payer: Self-pay | Admitting: Internal Medicine

## 2020-07-05 LAB — POCT ACTIVATED CLOTTING TIME: Activated Clotting Time: 345 seconds

## 2020-07-09 DIAGNOSIS — R7301 Impaired fasting glucose: Secondary | ICD-10-CM | POA: Diagnosis not present

## 2020-07-09 DIAGNOSIS — Z8679 Personal history of other diseases of the circulatory system: Secondary | ICD-10-CM | POA: Diagnosis not present

## 2020-07-09 DIAGNOSIS — E781 Pure hyperglyceridemia: Secondary | ICD-10-CM | POA: Diagnosis not present

## 2020-07-09 DIAGNOSIS — R7401 Elevation of levels of liver transaminase levels: Secondary | ICD-10-CM | POA: Diagnosis not present

## 2020-07-09 DIAGNOSIS — D6869 Other thrombophilia: Secondary | ICD-10-CM | POA: Diagnosis not present

## 2020-07-09 DIAGNOSIS — M8580 Other specified disorders of bone density and structure, unspecified site: Secondary | ICD-10-CM | POA: Diagnosis not present

## 2020-07-09 DIAGNOSIS — Z1331 Encounter for screening for depression: Secondary | ICD-10-CM | POA: Diagnosis not present

## 2020-07-09 DIAGNOSIS — R82998 Other abnormal findings in urine: Secondary | ICD-10-CM | POA: Diagnosis not present

## 2020-07-09 DIAGNOSIS — Z1339 Encounter for screening examination for other mental health and behavioral disorders: Secondary | ICD-10-CM | POA: Diagnosis not present

## 2020-07-09 DIAGNOSIS — Z0000 Encounter for general adult medical examination without abnormal findings: Secondary | ICD-10-CM | POA: Diagnosis not present

## 2020-07-09 DIAGNOSIS — N1831 Chronic kidney disease, stage 3a: Secondary | ICD-10-CM | POA: Diagnosis not present

## 2020-07-09 DIAGNOSIS — Z23 Encounter for immunization: Secondary | ICD-10-CM | POA: Diagnosis not present

## 2020-07-09 DIAGNOSIS — I480 Paroxysmal atrial fibrillation: Secondary | ICD-10-CM | POA: Diagnosis not present

## 2020-07-09 DIAGNOSIS — Z7901 Long term (current) use of anticoagulants: Secondary | ICD-10-CM | POA: Diagnosis not present

## 2020-07-09 DIAGNOSIS — Z95810 Presence of automatic (implantable) cardiac defibrillator: Secondary | ICD-10-CM | POA: Diagnosis not present

## 2020-07-09 DIAGNOSIS — M545 Low back pain, unspecified: Secondary | ICD-10-CM | POA: Diagnosis not present

## 2020-07-16 ENCOUNTER — Encounter (HOSPITAL_COMMUNITY): Payer: Self-pay

## 2020-07-16 ENCOUNTER — Telehealth: Payer: Self-pay

## 2020-07-16 DIAGNOSIS — M545 Low back pain, unspecified: Secondary | ICD-10-CM | POA: Diagnosis not present

## 2020-07-16 NOTE — Telephone Encounter (Signed)
Mandy from the [facility] clinic states that the patient is sending a transmission soon. We we receive it send it to donna carrol.

## 2020-07-16 NOTE — Telephone Encounter (Signed)
Notified patient to continue taking the 1/2 tablet of a Metoprolol 25mg  tablet. Post ablation it is not unusual to have A-fib episodes for the first 3 months. He is not able to check his blood pressure. He will contact the clinic back tomorrow to let us know his blood pressure reading. Patient verbalized understanding.

## 2020-07-16 NOTE — Telephone Encounter (Signed)
Manual transmission received;  Normal device function, presenting rhythm AF with irregular Vrates

## 2020-07-17 ENCOUNTER — Encounter (HOSPITAL_COMMUNITY): Payer: Self-pay

## 2020-07-18 ENCOUNTER — Encounter (HOSPITAL_COMMUNITY): Payer: Self-pay | Admitting: Nurse Practitioner

## 2020-07-18 ENCOUNTER — Other Ambulatory Visit: Payer: Self-pay

## 2020-07-18 ENCOUNTER — Ambulatory Visit (HOSPITAL_COMMUNITY)
Admission: RE | Admit: 2020-07-18 | Discharge: 2020-07-18 | Disposition: A | Discharge status: Home / Self Care | Payer: Medicare Other | Source: Ambulatory Visit | Attending: Nurse Practitioner | Admitting: Nurse Practitioner

## 2020-07-18 VITALS — BP 102/76 | HR 82 | Ht 70.0 in | Wt 170.8 lb

## 2020-07-18 DIAGNOSIS — Z95810 Presence of automatic (implantable) cardiac defibrillator: Secondary | ICD-10-CM | POA: Insufficient documentation

## 2020-07-18 DIAGNOSIS — I4819 Other persistent atrial fibrillation: Secondary | ICD-10-CM | POA: Diagnosis not present

## 2020-07-18 DIAGNOSIS — I4892 Unspecified atrial flutter: Secondary | ICD-10-CM | POA: Diagnosis not present

## 2020-07-18 DIAGNOSIS — Z87891 Personal history of nicotine dependence: Secondary | ICD-10-CM | POA: Insufficient documentation

## 2020-07-18 DIAGNOSIS — I422 Other hypertrophic cardiomyopathy: Secondary | ICD-10-CM | POA: Insufficient documentation

## 2020-07-18 DIAGNOSIS — Z79899 Other long term (current) drug therapy: Secondary | ICD-10-CM | POA: Insufficient documentation

## 2020-07-18 DIAGNOSIS — D6869 Other thrombophilia: Secondary | ICD-10-CM | POA: Diagnosis not present

## 2020-07-18 DIAGNOSIS — Z7901 Long term (current) use of anticoagulants: Secondary | ICD-10-CM | POA: Diagnosis not present

## 2020-07-18 LAB — MAGNESIUM: Magnesium: 2.1 mg/dL (ref 1.7–2.4)

## 2020-07-18 MED ORDER — METOPROLOL SUCCINATE ER 25 MG PO TB24
12.5000 mg | ORAL_TABLET | Freq: Two times a day (BID) | ORAL | 3 refills | Status: DC
Start: 2020-07-18 — End: 2020-08-06

## 2020-07-18 NOTE — Patient Instructions (Signed)
Increase metoprolol to 12.5mg  twice a day

## 2020-07-18 NOTE — Progress Notes (Signed)
Primary Care Physician: Marton Redwood, MD Referring [facility] Triage EP: Dr. Everlean Patterson Joseph Morrow is a 74 y.o. male with a h/o hypertrophic cardiomyopathy, s/p ICD for VT, remote paroxysmal afib, maintaining SR with a very small afib burden on Sotalol. Hospitalized for sotalol administration 02/2017. Chadsvasc score of 2(age, LV dysfunction), continues on eliquis 5 mg bid.  Qtc stable.   He asked to be seen today for afib noted yesterday. Interrogation of ICD/PPM confirms afib on 2/10 and 2/11, mostly starting 2/10 at 11:54 pm and continuing to 5:03 am, 2/11. He states that he drinks alcohol nightly and he often takes his sotalol 14 hours apart. Otherwise, no change from his usual health. Denies snoring. He continues on sotalol.  F/u in afib clinic, 02/14/20. I am seeing him as the device clinic noted onset of atrial flutter, rate controlled since  5/30. In the office he continues in rate controlled aflutter. He is mildly symptomatic with this. He continues on sotalol 120 mg bid, and is compliant. He has 2 alcoholic drinks a week. He had been on BB in the past . Stopped when sotalol loaded. Continues on eliquis 5 mg bid for a CHA2DS2VASc score of 3.   F/u in afib clinic, 03/11/20, as an urgent work-in as he noted increase in HR last night via Fitbit and confirmed with [name]. If not for seeing his HR elevated, he would not have felt  differently out of rhythm. He is on sotalol 120 mg bid and has been on this for several years. He saw Dr. Rayann Heman in June , was doing ok, but it was mentioned in his note, that if afib burden increased, he could consider ablation.  No significant change in health recently. No alcohol, caffeine, tobacco.  By  by the device clinic report this am, he appears to be in an atrial flutter at 117 bpm. BP on arrival 88/70. With a glass of water, BP came up to 104/70. The only medicine he is on is 12.5 metoprolol succinate that would affect BP. This was  added in June on an occasion  when he was out of rhythm. He will usually convert himself.   F/u in afib clinic, 11/4. He called the clinic earlier in the week, that he was out of rhythm. He does report that he missed all meds Friday pm and went out of rhythm Saturday pm, including his DOAC. He is in an atrial  Flutter, rate controlled. Reading his ablation OP report, he did  have at least 3 flutter circuits that could not be mapped or ablated. No swallowing or groin issues.  Today, he denies symptoms of   chest pain, shortness of breath, orthopnea, PND, lower extremity edema, dizziness, presyncope, syncope, or neurologic sequela.  Positive for palpitations..The patient is tolerating medications without difficulties and is otherwise without complaint today.   Past Medical History:  Diagnosis Date  . Acute cholecystitis 06/21/2011  . Allergy    SEASONAL  . Cataract   . Complication of anesthesia    HALLUCINATIONS  . GERD (gastroesophageal reflux disease)   . Hearing loss   . Hemorrhoids   . Hiatal hernia   . Hypertrophic cardiomyopathy ([place])   . Mitral regurgitation    Moderate  . Paroxysmal atrial fibrillation ([place]) 04/2015   documented on ICD remote interrogation, treated with sotalol  . Septal defect    LV Septal hyptertrophy 69mm, normal EF, minimal LVOT gradiant.  . Tubular adenoma of colon   .  VT (ventricular tachycardia) ([place]) 08/18/2013   s/p St. Jude ICD   Past Surgical History:  Procedure Laterality Date  . APPENDECTOMY  1950  . ATRIAL FIBRILLATION ABLATION N/A 07/04/2020   Procedure: ATRIAL FIBRILLATION ABLATION;  Surgeon: [name], MD;  Location: [facility];  Service: Cardiovascular;  Laterality: N/A;  . CARDIOVERSION N/A 02/25/2017   Procedure: CARDIOVERSION;  Surgeon: Fay Records, MD;  Location: [facility];  Service: Cardiovascular;  Laterality: N/A;  . CHOLECYSTECTOMY  06/24/2011  . COLONOSCOPY    . EP IMPLANTABLE DEVICE N/A 09/03/2016   SJM Fortify  Assura Dr generator change by Dr Rayann Heman.  ICD implanted for secondary prevention of sudden death  . IMPLANTABLE CARDIOVERTER DEFIBRILLATOR IMPLANT  08/18/2013   St. Jude Fortify Assura DR ICD implanted by Dr Rayann Heman for secondary treatment of VT  . IMPLANTABLE CARDIOVERTER DEFIBRILLATOR IMPLANT N/A 08/18/2013   Procedure: IMPLANTABLE CARDIOVERTER DEFIBRILLATOR IMPLANT;  Surgeon: Coralyn Mark, MD;  Location: [facility];  Service: Cardiovascular;  Laterality: N/A;    Current Outpatient Medications  Medication Sig Dispense Refill  . calcium carbonate (OSCAL) 1500 (600 Ca) MG TABS tablet Take 600 mg by mouth daily.    . cholecalciferol (VITAMIN D) 25 MCG (1000 UNIT) tablet Take 1,000 Units by mouth in the morning and at bedtime.    . Coenzyme Q10 (CO Q 10) 100 MG CAPS Take 100 mg by mouth daily.     Marland Kitchen ELIQUIS 5 MG TABS tablet TAKE 1 TABLET BY MOUTH TWICE DAILY (Patient taking differently: Take 5 mg by mouth 2 (two) times daily. ) 60 tablet 6  . Glucosamine-Chondroitin (COSAMIN DS PO) Take 1 tablet by mouth daily.    . metoprolol succinate (TOPROL-XL) 25 MG 24 hr tablet Take 0.5 tablets (12.5 mg total) by mouth in the morning and at bedtime. 45 tablet 3  . Omeprazole Magnesium (PRILOSEC OTC PO) Take 10 mg by mouth daily.     . Probiotic Product (ALIGN PO) Take 1 capsule by mouth daily.    . sotalol (BETAPACE) 120 MG tablet TAKE 1 TABLET(120 MG) BY MOUTH TWICE DAILY 180 tablet 1  . Zinc 50 MG CAPS Take 50 mg by mouth 2 (two) times daily.      No current facility-administered medications for this encounter.    Allergies  Allergen Reactions  . Dextromethorphan Other (See Comments)    nervous  . Versed [Midazolam] Other (See Comments)    "hallucinations, headache"  . Antihistamines, Diphenhydramine-Type Anxiety    Social History   Socioeconomic History  . Marital status: Married    Spouse name: Not on file  . Number of children: 2  . Years of education: Not on file  . Highest education  level: Not on file  Occupational History  . Occupation: retired  Tobacco Use  . Smoking status: Former Smoker    Packs/day: 2.00    Years: 20.00    Pack years: 40.00    Types: Cigarettes    Quit date: 07/06/1981    Years since quitting: 39.0  . Smokeless tobacco: Never Used  Vaping Use  . Vaping Use: Never used  Substance and Sexual Activity  . Alcohol use: Yes    Alcohol/week: 0.0 standard drinks    Comment: 2 glasses per day  . Drug use: No  . Sexual activity: Not on file  Other Topics Concern  . Not on file  Social History Narrative   Originally from [place]. Moved to [place] in 2012. Currently has no  pets. No bird or hot tub exposure. Previously worked running [place]. He also worked as a Air cabin crew. He did have exposure to Beneze, Acetone, Styrene, & Hydrocarbon early in his career.   Social Determinants of Health   Financial Resource Strain:   . Difficulty of Paying Living Expenses: Not on file  Food Insecurity:   . Worried About Charity fundraiser in the Last Year: Not on file  . Ran Out of Food in the Last Year: Not on file  Transportation Needs:   . Lack of Transportation (Medical): Not on file  . Lack of Transportation (Non-Medical): Not on file  Physical Activity:   . Days of Exercise per Week: Not on file  . Minutes of Exercise per Session: Not on file  Stress:   . Feeling of Stress : Not on file  Social Connections:   . Frequency of Communication with Friends and Family: Not on file  . Frequency of Social Gatherings with Friends and Family: Not on file  . Attends Religious Services: Not on file  . Active Member of Clubs or Organizations: Not on file  . Attends Archivist Meetings: Not on file  . Marital Status: Not on file  Intimate Partner Violence:   . Fear of Current or Ex-Partner: Not on file  . Emotionally Abused: Not on file  . Physically Abused: Not on file  . Sexually Abused: Not on file    Family History  Problem Relation  Age of Onset  . Hypertrophic cardiomyopathy Mother   . Arrhythmia Mother   . Heart failure Mother   . Pneumonia Father   . Diabetes Father   . Colon polyps Father   . Heart attack Brother   . Hypertrophic cardiomyopathy Brother   . Heart Problems Brother        VAD  . Arrhythmia Brother   . Diabetes Brother   . Sudden death Brother   . Lung disease Neg Hx     ROS- All systems are reviewed and negative except as per the HPI above  Physical Exam: Vitals:   07/18/20 1041  BP: 102/76  Pulse: 82  Weight: 77.5 kg  Height: 5\' 10"  (1.778 m)   Wt Readings from Last 3 Encounters:  07/18/20 77.5 kg  07/04/20 78.5 kg  06/12/20 79.7 kg    Labs: Lab Results  Component Value Date   NA 141 06/12/2020   K 4.3 06/12/2020   CL 104 06/12/2020   CO2 26 06/12/2020   GLUCOSE 78 06/12/2020   BUN 21 06/12/2020   CREATININE 1.28 (H) 06/12/2020   CALCIUM 9.4 06/12/2020   MG 2.0 10/13/2018   No results found for: INR No results found for: CHOL, HDL, LDLCALC, TRIG   GEN- The patient is well appearing, alert and oriented x 3 today.   Head- normocephalic, atraumatic Eyes-  Sclera clear, conjunctiva pink Ears- hearing intact Oropharynx- clear Neck- supple, no JVP Lymph- no cervical lymphadenopathy Lungs- Clear to ausculation bilaterally, normal work of breathing Heart-irregular rate and rhythm, no murmurs, rubs or gallops, PMI not laterally displaced GI- soft, NT, ND, + BS Extremities- no clubbing, cyanosis, or edema MS- no significant deformity or atrophy Skin- no rash or lesion Psych- euthymic mood, full affect Neuro- strength and sensation are intact  EKG-  Atrial  flutter with variable AV block, v rate 82 bpm, qrs int 72 ms, qtc 441 ms    1. Hypertrophic cardiomyopathy, sigmoid subtype. Basal septum measures 18  mm in  maximal dimension. Flow acceleration noted at the LVOT, no LVOT  obstruction noted. Chordal systolic anterior motion of the mitral valve  without involvment  of the anterior  leaflet.  2. Left ventricular ejection fraction, by visual estimation, is 60 to  65%. The left ventricle has normal function. There is severely increased  left ventricular hypertrophy.  3. Elevated left atrial and left ventricular end-diastolic pressures.  4. Left ventricular diastolic parameters are consistent with Grade III  diastolic dysfunction (restrictive).  5. Global right ventricle has normal systolic function.The right  ventricular size is normal. No increase in right ventricular wall  thickness.  6. Left atrial size was moderately dilated.  7. Right atrial size was moderately dilated.  8. The mitral valve is normal in structure. Mild mitral valve  regurgitation. No evidence of mitral stenosis.  9. The tricuspid valve is normal in structure. Tricuspid valve  regurgitation is mild.  10. The aortic valve is tricuspid. Aortic valve regurgitation is not  visualized. No evidence of aortic valve sclerosis or stenosis.  11. The pulmonic valve was normal in structure. Pulmonic valve  regurgitation is mild.  12. Moderately elevated pulmonary artery systolic pressure.  13. The inferior vena cava is dilated in size with <50% respiratory  variability, suggesting right atrial pressure of 15 mmHg.  14. The left ventricle has no regional wall motion abnormalities.  Echo- 2020----------------------------------------------------------------- Study Conclusions  - Left ventricle: The cavity size was normal. There was moderate   asymmetric hypertrophy of the septum. The appearance was   consistent with hypertrophic cardiomyopathy. Systolic function   was vigorous. The estimated ejection fraction was in the range of   65% to 70%. There was no dynamic obstruction. Wall motion was   normal; there were no regional wall motion abnormalities. - Mitral valve: There was mild to moderate regurgitation directed   centrally. - Left atrium: The atrium was severely dilated. -  Right ventricle: The cavity size was mildly dilated. - Right atrium: The atrium was mildly dilated. - Tricuspid valve: There was moderate regurgitation. - Pulmonary arteries: Systolic pressure was mildly increased. PA   peak pressure: 41 mm Hg (S).   Assessment and Plan: 1. Atrial  flutter   s/p ablation 10/21  Went out into atrial flutter  Saturday pm, missed meds Friday PM  Op note showed 3 atrial flutter circuits that could not be ablated He is on sotalol 120 mg bid  He is on  metoprolol succinate 12.5 mg qd, can try to increase to 12.5 mg bid, watch BP at home  Continue eliquis 5 mg bid for a CHA2DS2VASc score of at least 3  2. HCM Stable  ICD Per Dr. Rayann Heman   I will see back in one week   Joseph Morrow, [facility] [address] [place], [place] [address] [phone]

## 2020-07-24 DIAGNOSIS — M545 Low back pain, unspecified: Secondary | ICD-10-CM | POA: Diagnosis not present

## 2020-07-25 ENCOUNTER — Other Ambulatory Visit: Payer: Self-pay

## 2020-07-25 ENCOUNTER — Ambulatory Visit (HOSPITAL_COMMUNITY)
Admission: RE | Admit: 2020-07-25 | Discharge: 2020-07-25 | Disposition: A | Discharge status: Home / Self Care | Payer: Medicare Other | Source: Ambulatory Visit | Attending: Nurse Practitioner | Admitting: Nurse Practitioner

## 2020-07-25 VITALS — BP 114/78 | HR 57 | Ht 70.0 in | Wt 174.6 lb

## 2020-07-25 DIAGNOSIS — I371 Nonrheumatic pulmonary valve insufficiency: Secondary | ICD-10-CM | POA: Diagnosis not present

## 2020-07-25 DIAGNOSIS — Z95810 Presence of automatic (implantable) cardiac defibrillator: Secondary | ICD-10-CM | POA: Insufficient documentation

## 2020-07-25 DIAGNOSIS — I4891 Unspecified atrial fibrillation: Secondary | ICD-10-CM | POA: Insufficient documentation

## 2020-07-25 DIAGNOSIS — Z87891 Personal history of nicotine dependence: Secondary | ICD-10-CM | POA: Insufficient documentation

## 2020-07-25 DIAGNOSIS — I4892 Unspecified atrial flutter: Secondary | ICD-10-CM | POA: Diagnosis not present

## 2020-07-25 DIAGNOSIS — Z7901 Long term (current) use of anticoagulants: Secondary | ICD-10-CM | POA: Diagnosis not present

## 2020-07-25 DIAGNOSIS — I484 Atypical atrial flutter: Secondary | ICD-10-CM

## 2020-07-25 DIAGNOSIS — I422 Other hypertrophic cardiomyopathy: Secondary | ICD-10-CM | POA: Diagnosis not present

## 2020-07-25 DIAGNOSIS — D6869 Other thrombophilia: Secondary | ICD-10-CM

## 2020-07-25 NOTE — Progress Notes (Signed)
Primary Care Physician: Joseph Redwood, MD Referring Joseph Morrow Triage EP: Dr. Everlean Morrow Joseph Morrow is a 74 y.o. male with a h/o hypertrophic cardiomyopathy, s/p ICD for VT, remote paroxysmal afib, maintaining SR with a very small afib burden on Sotalol. Hospitalized for sotalol administration 02/2017. Chadsvasc score of 2(age, LV dysfunction), continues on eliquis 5 mg bid.  Qtc stable.   He asked to be seen today for afib noted yesterday. Interrogation of ICD/PPM confirms afib on 2/10 and 2/11, mostly starting 2/10 at 11:54 pm and continuing to 5:03 am, 2/11. He states that he drinks alcohol nightly and he often takes his sotalol 14 hours apart. Otherwise, no change from his usual health. Denies snoring. He continues on sotalol.  F/u in afib clinic, 02/14/20. I am seeing him as the device clinic noted onset of atrial flutter, rate controlled since  5/30. In the office he continues in rate controlled aflutter. He is mildly symptomatic with this. He continues on sotalol 120 mg bid, and is compliant. He has 2 alcoholic drinks a week. He had been on BB in the past . Stopped when sotalol loaded. Continues on eliquis 5 mg bid for a CHA2DS2VASc score of 3.   F/u in afib clinic, 03/11/20, as an urgent work-in as he noted increase in HR last night via Fitbit and confirmed with [name]. If not for seeing his HR elevated, he would not have felt  differently out of rhythm. He is on sotalol 120 mg bid and has been on this for several years. He saw Dr. Rayann Morrow in June , was doing ok, but it was mentioned in his note, that if afib burden increased, he could consider ablation.  No significant change in health recently. No alcohol, caffeine, tobacco.  By  by the device clinic report this am, he appears to be in an atrial flutter at 117 bpm. BP on arrival 88/70. With a glass of water, BP came up to 104/70. The only medicine he is on is 12.5 metoprolol succinate that would affect BP. This was  added in June on an occasion  when he was out of rhythm. He will usually convert himself.   F/u in afib clinic, 11/4. He called the clinic earlier in the week, that he was out of rhythm. He does report that he missed all meds Friday pm and went out of rhythm Saturday pm, including his DOAC. He is in an atrial  Flutter, rate controlled. Reading his ablation OP report, he did  have at least 3 flutter circuits that could not be mapped or ablated. No swallowing or groin issues.  F/u in afib clinic, 11/11. He has returned to [facility]. Ekg shows sinus brady at 57 bpm, with first degree AV block.  PR int 236 ms, qrs int 66 ms, qtc 428 ms. He feels well.   Today, he denies symptoms of   chest pain, shortness of breath, orthopnea, PND, lower extremity edema, dizziness, presyncope, syncope, or neurologic sequela.  Positive for palpitations..The patient is tolerating medications without difficulties and is otherwise without complaint today.   Past Medical History:  Diagnosis Date  . Acute cholecystitis 06/21/2011  . Allergy    SEASONAL  . Cataract   . Complication of anesthesia    HALLUCINATIONS  . GERD (gastroesophageal reflux disease)   . Hearing loss   . Hemorrhoids   . Hiatal hernia   . Hypertrophic cardiomyopathy ([place])   . Mitral regurgitation    Moderate  . Paroxysmal  atrial fibrillation ([place]) 04/2015   documented on ICD remote interrogation, treated with sotalol  . Septal defect    LV Septal hyptertrophy 18mm, normal EF, minimal LVOT gradiant.  . Tubular adenoma of colon   . VT (ventricular tachycardia) ([place]) 08/18/2013   s/p St. Jude ICD   Past Surgical History:  Procedure Laterality Date  . APPENDECTOMY  1950  . ATRIAL FIBRILLATION ABLATION N/A 07/04/2020   Procedure: ATRIAL FIBRILLATION ABLATION;  Surgeon: Joseph Grayer, MD;  Location: [facility];  Service: Cardiovascular;  Laterality: N/A;  . CARDIOVERSION N/A 02/25/2017   Procedure: CARDIOVERSION;  Surgeon: Joseph Records, MD;   Location: [facility];  Service: Cardiovascular;  Laterality: N/A;  . CHOLECYSTECTOMY  06/24/2011  . COLONOSCOPY    . EP IMPLANTABLE DEVICE N/A 09/03/2016   SJM Fortify Assura Dr generator change by Dr Joseph Morrow.  ICD implanted for secondary prevention of sudden death  . IMPLANTABLE CARDIOVERTER DEFIBRILLATOR IMPLANT  08/18/2013   St. Jude Fortify Assura DR ICD implanted by Dr Joseph Morrow for secondary treatment of VT  . IMPLANTABLE CARDIOVERTER DEFIBRILLATOR IMPLANT N/A 08/18/2013   Procedure: IMPLANTABLE CARDIOVERTER DEFIBRILLATOR IMPLANT;  Surgeon: Joseph Mark, MD;  Location: [facility];  Service: Cardiovascular;  Laterality: N/A;    Current Outpatient Medications  Medication Sig Dispense Refill  . calcium carbonate (OSCAL) 1500 (600 Ca) MG TABS tablet Take 600 mg by mouth daily.    . cholecalciferol (VITAMIN D) 25 MCG (1000 UNIT) tablet Take 1,000 Units by mouth in the morning and at bedtime.    . Coenzyme Q10 (CO Q 10) 100 MG CAPS Take 100 mg by mouth daily.     Marland Kitchen ELIQUIS 5 MG TABS tablet TAKE 1 TABLET BY MOUTH TWICE DAILY (Patient taking differently: Take 5 mg by mouth 2 (two) times daily. ) 60 tablet 6  . Glucosamine-Chondroitin (COSAMIN DS PO) Take 1 tablet by mouth daily.    . metoprolol succinate (TOPROL-XL) 25 MG 24 hr tablet Take 0.5 tablets (12.5 mg total) by mouth in the morning and at bedtime. 45 tablet 3  . Omeprazole Magnesium (PRILOSEC OTC PO) Take 10 mg by mouth daily.     . Probiotic Product (ALIGN PO) Take 1 capsule by mouth daily.    . sotalol (BETAPACE) 120 MG tablet TAKE 1 TABLET(120 MG) BY MOUTH TWICE DAILY 180 tablet 1  . Zinc 50 MG CAPS Take 50 mg by mouth 2 (two) times daily.      No current facility-administered medications for this encounter.    Allergies  Allergen Reactions  . Dextromethorphan Other (See Comments)    nervous  . Versed [Midazolam] Other (See Comments)    "hallucinations, headache"  . Antihistamines, Diphenhydramine-Type Anxiety    Social  History   Socioeconomic History  . Marital status: Married    Spouse name: Not on file  . Number of children: 2  . Years of education: Not on file  . Highest education level: Not on file  Occupational History  . Occupation: retired  Tobacco Use  . Smoking status: Former Smoker    Packs/day: 2.00    Years: 20.00    Pack years: 40.00    Types: Cigarettes    Quit date: 07/06/1981    Years since quitting: 39.0  . Smokeless tobacco: Never Used  Vaping Use  . Vaping Use: Never used  Substance and Sexual Activity  . Alcohol use: Yes    Alcohol/week: 0.0 standard drinks    Comment: 2 glasses per  day  . Drug use: No  . Sexual activity: Not on file  Other Topics Concern  . Not on file  Social History Narrative   Originally from [place]. Moved to [place] in 2012. Currently has no pets. No bird or hot tub exposure. 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 He did have exposure to Beneze, Acetone, Styrene, & Hydrocarbon early in his career.   Social Determinants of Health   Financial Resource Strain:   . Difficulty of Paying Living Expenses: Not on file  Food Insecurity:   . Worried About Charity fundraiser in the Last Year: Not on file  . Ran Out of Food in the Last Year: Not on file  Transportation Needs:   . Lack of Transportation (Medical): Not on file  . Lack of Transportation (Non-Medical): Not on file  Physical Activity:   . Days of Exercise per Week: Not on file  . Minutes of Exercise per Session: Not on file  Stress:   . Feeling of Stress : Not on file  Social Connections:   . Frequency of Communication with Friends and Family: Not on file  . Frequency of Social Gatherings with Friends and Family: Not on file  . Attends Religious Services: Not on file  . Active Member of Clubs or Organizations: Not on file  . Attends Archivist Meetings: Not on file  . Marital Status: Not on file  Intimate Partner Violence:   . Fear of  Current or Ex-Partner: Not on file  . Emotionally Abused: Not on file  . Physically Abused: Not on file  . Sexually Abused: Not on file    Family History  Problem Relation Age of Onset  . Hypertrophic cardiomyopathy Mother   . Arrhythmia Mother   . Heart failure Mother   . Pneumonia Father   . Diabetes Father   . Colon polyps Father   . Heart attack Brother   . Hypertrophic cardiomyopathy Brother   . Heart Problems Brother        VAD  . Arrhythmia Brother   . Diabetes Brother   . Sudden death Brother   . Lung disease Neg Hx     ROS- All systems are reviewed and negative except as per the HPI above  Physical Exam: Vitals:   07/25/20 1001  BP: 114/78  Pulse: (!) 57  Weight: 79.2 kg  Height: 5\' 10"  (1.778 m)   Wt Readings from Last 3 Encounters:  07/25/20 79.2 kg  07/18/20 77.5 kg  07/04/20 78.5 kg    Labs: Lab Results  Component Value Date   NA 141 06/12/2020   K 4.3 06/12/2020   CL 104 06/12/2020   CO2 26 06/12/2020   GLUCOSE 78 06/12/2020   BUN 21 06/12/2020   CREATININE 1.28 (H) 06/12/2020   CALCIUM 9.4 06/12/2020   MG 2.1 07/18/2020   No results found for: INR No results found for: CHOL, HDL, LDLCALC, TRIG   GEN- The patient is well appearing, alert and oriented x 3 today.   Head- normocephalic, atraumatic Eyes-  Sclera clear, conjunctiva pink Ears- hearing intact Oropharynx- clear Neck- supple, no JVP Lymph- no cervical lymphadenopathy Lungs- Clear to ausculation bilaterally, normal work of breathing Heart regular rate and rhythm, no murmurs, rubs or gallops, PMI not laterally displaced GI- soft, NT, ND, + BS Extremities- no clubbing, cyanosis, or edema MS- no significant deformity or atrophy Skin- no rash or lesion Psych- euthymic mood, full affect Neuro-  strength and sensation are intact  EKG-  Sinus brady at 57 bpm, pr int 236 ms, qrs int 236 ms, qtc 428 ms.   1. Hypertrophic cardiomyopathy, sigmoid subtype. Basal septum measures 18   mm in maximal dimension. Flow acceleration noted at the LVOT, no LVOT  obstruction noted. Chordal systolic anterior motion of the mitral valve  without involvment of the anterior  leaflet.  2. Left ventricular ejection fraction, by visual estimation, is 60 to  65%. The left ventricle has normal function. There is severely increased  left ventricular hypertrophy.  3. Elevated left atrial and left ventricular end-diastolic pressures.  4. Left ventricular diastolic parameters are consistent with Grade III  diastolic dysfunction (restrictive).  5. Global right ventricle has normal systolic function.The right  ventricular size is normal. No increase in right ventricular wall  thickness.  6. Left atrial size was moderately dilated.  7. Right atrial size was moderately dilated.  8. The mitral valve is normal in structure. Mild mitral valve  regurgitation. No evidence of mitral stenosis.  9. The tricuspid valve is normal in structure. Tricuspid valve  regurgitation is mild.  10. The aortic valve is tricuspid. Aortic valve regurgitation is not  visualized. No evidence of aortic valve sclerosis or stenosis.  11. The pulmonic valve was normal in structure. Pulmonic valve  regurgitation is mild.  12. Moderately elevated pulmonary artery systolic pressure.  13. The inferior vena cava is dilated in size with <50% respiratory  variability, suggesting right atrial pressure of 15 mmHg.  14. The left ventricle has no regional wall motion abnormalities.  Echo- 2020----------------------------------------------------------------- Study Conclusions  - Left ventricle: The cavity size was normal. There was moderate   asymmetric hypertrophy of the septum. The appearance was   consistent with hypertrophic cardiomyopathy. Systolic function   was vigorous. The estimated ejection fraction was in the range of   65% to 70%. There was no dynamic obstruction. Wall motion was   normal; there were no  regional wall motion abnormalities. - Mitral valve: There was mild to moderate regurgitation directed   centrally. - Left atrium: The atrium was severely dilated. - Right ventricle: The cavity size was mildly dilated. - Right atrium: The atrium was mildly dilated. - Tricuspid valve: There was moderate regurgitation. - Pulmonary arteries: Systolic pressure was mildly increased. PA   peak pressure: 41 mm Hg (S).   Assessment and Plan: 1. Atrial  flutter   s/p ablation 10/21  Went  into atrial flutter around 10 days ago , missed meds the  Friday PM before  Op note showed 3 atrial flutter circuits that could not be ablated He is on sotalol 120 mg bid  He was on  metoprolol succinate 12.5 mg qd,   increased this to  12.5 mg bid,   BP stable  at home  He is now back in rhythm  Continue eliquis 5 mg bid for a CHA2DS2VASc score of at least 3  2. HCM Stable  ICD Per Dr. Rayann Morrow   I will see back for his one month post ablation appointment 11/19   Joseph Morrow, [facility] [address] [place], [place] [address] [phone]

## 2020-07-30 DIAGNOSIS — Z23 Encounter for immunization: Secondary | ICD-10-CM | POA: Diagnosis not present

## 2020-07-31 ENCOUNTER — Encounter (HOSPITAL_COMMUNITY): Payer: Self-pay

## 2020-07-31 ENCOUNTER — Other Ambulatory Visit (HOSPITAL_COMMUNITY): Payer: Self-pay | Admitting: *Deleted

## 2020-07-31 DIAGNOSIS — M545 Low back pain, unspecified: Secondary | ICD-10-CM | POA: Diagnosis not present

## 2020-07-31 MED ORDER — SOTALOL HCL 120 MG PO TABS
ORAL_TABLET | ORAL | 1 refills | Status: DC
Start: 2020-07-31 — End: 2020-08-28

## 2020-08-02 ENCOUNTER — Ambulatory Visit (HOSPITAL_COMMUNITY)
Admission: RE | Admit: 2020-08-02 | Discharge: 2020-08-02 | Disposition: A | Discharge status: Home / Self Care | Payer: Medicare Other | Source: Ambulatory Visit | Attending: Nurse Practitioner | Admitting: Nurse Practitioner

## 2020-08-02 ENCOUNTER — Other Ambulatory Visit: Payer: Self-pay

## 2020-08-02 ENCOUNTER — Encounter (HOSPITAL_COMMUNITY): Payer: Self-pay | Admitting: Nurse Practitioner

## 2020-08-02 VITALS — BP 114/76 | HR 56 | Ht 70.0 in | Wt 171.8 lb

## 2020-08-02 DIAGNOSIS — Z79899 Other long term (current) drug therapy: Secondary | ICD-10-CM | POA: Diagnosis not present

## 2020-08-02 DIAGNOSIS — Z87891 Personal history of nicotine dependence: Secondary | ICD-10-CM | POA: Diagnosis not present

## 2020-08-02 DIAGNOSIS — I484 Atypical atrial flutter: Secondary | ICD-10-CM

## 2020-08-02 DIAGNOSIS — Z7901 Long term (current) use of anticoagulants: Secondary | ICD-10-CM | POA: Insufficient documentation

## 2020-08-02 DIAGNOSIS — I4892 Unspecified atrial flutter: Secondary | ICD-10-CM | POA: Insufficient documentation

## 2020-08-02 DIAGNOSIS — I422 Other hypertrophic cardiomyopathy: Secondary | ICD-10-CM | POA: Insufficient documentation

## 2020-08-02 DIAGNOSIS — D6869 Other thrombophilia: Secondary | ICD-10-CM | POA: Diagnosis not present

## 2020-08-02 DIAGNOSIS — Z95810 Presence of automatic (implantable) cardiac defibrillator: Secondary | ICD-10-CM | POA: Diagnosis not present

## 2020-08-02 NOTE — Progress Notes (Signed)
Primary Care Physician: Marton Redwood, MD Referring [place] Triage EP: Dr. Everlean Patterson Joseph Morrow is a 74 y.o. male with a h/o hypertrophic cardiomyopathy, s/p ICD for VT, remote paroxysmal afib, maintaining SR with a very small afib burden on Sotalol. Hospitalized for sotalol administration 02/2017. Chadsvasc score of 2(age, LV dysfunction), continues on eliquis 5 mg bid.  Qtc stable.   He asked to be seen today for afib noted yesterday. Interrogation of ICD/PPM confirms afib on 2/10 and 2/11, mostly starting 2/10 at 11:54 pm and continuing to 5:03 am, 2/11. He states that he drinks alcohol nightly and he often takes his sotalol 14 hours apart. Otherwise, no change from his usual health. Denies snoring. He continues on sotalol.  F/u in afib clinic, 02/14/20. I am seeing him as the device clinic noted onset of atrial flutter, rate controlled since  5/30. In the office he continues in rate controlled aflutter. He is mildly symptomatic with this. He continues on sotalol 120 mg bid, and is compliant. He has 2 alcoholic drinks a week. He had been on BB in the past . Stopped when sotalol loaded. Continues on eliquis 5 mg bid for a CHA2DS2VASc score of 3.   F/u in afib clinic, 03/11/20, as an urgent work-in as he noted increase in HR last night via Fitbit and confirmed with [name]. If not for seeing his HR elevated, he would not have felt  differently out of rhythm. He is on sotalol 120 mg bid and has been on this for several years. He saw Dr. Rayann Heman in June , was doing ok, but it was mentioned in his note, that if afib burden increased, he could consider ablation.  No significant change in health recently. No alcohol, caffeine, tobacco.  By  by the device clinic report this am, he appears to be in an atrial flutter at 117 bpm. BP on arrival 88/70. With a glass of water, BP came up to 104/70. The only medicine he is on is 12.5 metoprolol succinate that would affect BP. This was  added in June on an occasion  when he was out of rhythm. He will usually convert himself.   F/u in afib clinic, 11/4. He called the clinic earlier in the week, that he was out of rhythm. He does report that he missed all meds Friday pm and went out of rhythm Saturday pm, including his DOAC. He is in an atrial  Flutter, rate controlled. Reading his ablation OP report, he did  have at least 3 flutter circuits that could not be mapped or ablated. No swallowing or groin issues.  F/u in afib clinic, 11/11. He has returned to [place]. Ekg shows sinus brady at 57 bpm, with first degree AV block.  PR int 236 ms, qrs int 66 ms, qtc 428 ms. He feels well.   F/u 11/19. He is now one month out from  ablation and has been in [place] for the last 2 weeks. He feels improved. No issues today.   Today, he denies symptoms of   chest pain, shortness of breath, orthopnea, PND, lower extremity edema, dizziness, presyncope, syncope, or neurologic sequela.  Positive for palpitations..The patient is tolerating medications without difficulties and is otherwise without complaint today.   Past Medical History:  Diagnosis Date  . Acute cholecystitis 06/21/2011  . Allergy    SEASONAL  . Cataract   . Complication of anesthesia    HALLUCINATIONS  . GERD (gastroesophageal reflux disease)   .  Hearing loss   . Hemorrhoids   . Hiatal hernia   . Hypertrophic cardiomyopathy ([place])   . Mitral regurgitation    Moderate  . Paroxysmal atrial fibrillation ([place]) 04/2015   documented on ICD remote interrogation, treated with sotalol  . Septal defect    LV Septal hyptertrophy 71mm, normal EF, minimal LVOT gradiant.  . Tubular adenoma of colon   . VT (ventricular tachycardia) ([place]) 08/18/2013   s/p St. Jude ICD   Past Surgical History:  Procedure Laterality Date  . APPENDECTOMY  1950  . ATRIAL FIBRILLATION ABLATION N/A 07/04/2020   Procedure: ATRIAL FIBRILLATION ABLATION;  Surgeon: [name], MD;  Location: [facility];   Service: Cardiovascular;  Laterality: N/A;  . CARDIOVERSION N/A 02/25/2017   Procedure: CARDIOVERSION;  Surgeon: Fay Records, MD;  Location: [facility];  Service: Cardiovascular;  Laterality: N/A;  . CHOLECYSTECTOMY  06/24/2011  . COLONOSCOPY    . EP IMPLANTABLE DEVICE N/A 09/03/2016   SJM Fortify Assura Dr generator change by Dr Rayann Heman.  ICD implanted for secondary prevention of sudden death  . IMPLANTABLE CARDIOVERTER DEFIBRILLATOR IMPLANT  08/18/2013   St. Jude Fortify Assura DR ICD implanted by Dr Rayann Heman for secondary treatment of VT  . IMPLANTABLE CARDIOVERTER DEFIBRILLATOR IMPLANT N/A 08/18/2013   Procedure: IMPLANTABLE CARDIOVERTER DEFIBRILLATOR IMPLANT;  Surgeon: Coralyn Mark, MD;  Location: [facility];  Service: Cardiovascular;  Laterality: N/A;    Current Outpatient Medications  Medication Sig Dispense Refill  . calcium carbonate (OSCAL) 1500 (600 Ca) MG TABS tablet Take 600 mg by mouth daily.    . cholecalciferol (VITAMIN D) 25 MCG (1000 UNIT) tablet Take 1,000 Units by mouth in the morning and at bedtime.    . Coenzyme Q10 (CO Q 10) 100 MG CAPS Take 100 mg by mouth daily.     Marland Kitchen ELIQUIS 5 MG TABS tablet TAKE 1 TABLET BY MOUTH TWICE DAILY (Patient taking differently: Take 5 mg by mouth 2 (two) times daily. ) 60 tablet 6  . Glucosamine-Chondroitin (COSAMIN DS PO) Take 1 tablet by mouth daily.    . metoprolol succinate (TOPROL-XL) 25 MG 24 hr tablet Take 0.5 tablets (12.5 mg total) by mouth in the morning and at bedtime. 45 tablet 3  . Omeprazole Magnesium (PRILOSEC OTC PO) Take 10 mg by mouth daily.     . Probiotic Product (ALIGN PO) Take 1 capsule by mouth daily.    . sotalol (BETAPACE) 120 MG tablet TAKE 1 TABLET(120 MG) BY MOUTH TWICE DAILY 180 tablet 1  . Zinc 50 MG CAPS Take 50 mg by mouth 2 (two) times daily.      No current facility-administered medications for this encounter.    Allergies  Allergen Reactions  . Dextromethorphan Other (See Comments)    nervous  .  Versed [Midazolam] Other (See Comments)    "hallucinations, headache"  . Antihistamines, Diphenhydramine-Type Anxiety    Social History   Socioeconomic History  . Marital status: Married    Spouse name: Not on file  . Number of children: 2  . Years of education: Not on file  . Highest education level: Not on file  Occupational History  . Occupation: retired  Tobacco Use  . Smoking status: Former Smoker    Packs/day: 2.00    Years: 20.00    Pack years: 40.00    Types: Cigarettes    Quit date: 07/06/1981    Years since quitting: 39.1  . Smokeless tobacco: Never Used  Vaping Use  .  Vaping Use: Never used  Substance and Sexual Activity  . Alcohol use: Yes    Alcohol/week: 0.0 standard drinks    Comment: 2 glasses per day  . Drug use: No  . Sexual activity: Not on file  Other Topics Concern  . Not on file  Social History Narrative   Originally from [place]. Moved to [place] in 2012. Currently has no pets. No bird or hot tub exposure. Previously worked running [place]. He also worked as a Air cabin crew. He did have exposure to Beneze, Acetone, Styrene, & Hydrocarbon early in his career.   Social Determinants of Health   Financial Resource Strain:   . Difficulty of Paying Living Expenses: Not on file  Food Insecurity:   . Worried About Charity fundraiser in the Last Year: Not on file  . Ran Out of Food in the Last Year: Not on file  Transportation Needs:   . Lack of Transportation (Medical): Not on file  . Lack of Transportation (Non-Medical): Not on file  Physical Activity:   . Days of Exercise per Week: Not on file  . Minutes of Exercise per Session: Not on file  Stress:   . Feeling of Stress : Not on file  Social Connections:   . Frequency of Communication with Friends and Family: Not on file  . Frequency of Social Gatherings with Friends and Family: Not on file  . Attends Religious Services: Not on file  . Active Member of Clubs or Organizations: Not on file   . Attends Archivist Meetings: Not on file  . Marital Status: Not on file  Intimate Partner Violence:   . Fear of Current or Ex-Partner: Not on file  . Emotionally Abused: Not on file  . Physically Abused: Not on file  . Sexually Abused: Not on file    Family History  Problem Relation Age of Onset  . Hypertrophic cardiomyopathy Mother   . Arrhythmia Mother   . Heart failure Mother   . Pneumonia Father   . Diabetes Father   . Colon polyps Father   . Heart attack Brother   . Hypertrophic cardiomyopathy Brother   . Heart Problems Brother        VAD  . Arrhythmia Brother   . Diabetes Brother   . Sudden death Brother   . Lung disease Neg Hx     ROS- All systems are reviewed and negative except as per the HPI above  Physical Exam: Vitals:   08/02/20 1050  BP: 114/76  Pulse: (!) 56  Weight: 77.9 kg  Height: 5\' 10"  (1.778 m)   Wt Readings from Last 3 Encounters:  08/02/20 77.9 kg  07/25/20 79.2 kg  07/18/20 77.5 kg    Labs: Lab Results  Component Value Date   NA 141 06/12/2020   K 4.3 06/12/2020   CL 104 06/12/2020   CO2 26 06/12/2020   GLUCOSE 78 06/12/2020   BUN 21 06/12/2020   CREATININE 1.28 (H) 06/12/2020   CALCIUM 9.4 06/12/2020   MG 2.1 07/18/2020   No results found for: INR No results found for: CHOL, HDL, LDLCALC, TRIG   GEN- The patient is well appearing, alert and oriented x 3 today.   Head- normocephalic, atraumatic Eyes-  Sclera clear, conjunctiva pink Ears- hearing intact Oropharynx- clear Neck- supple, no JVP Lymph- no cervical lymphadenopathy Lungs- Clear to ausculation bilaterally, normal work of breathing Heart regular rate and rhythm, no murmurs, rubs or gallops, PMI not laterally displaced GI-  soft, NT, ND, + BS Extremities- no clubbing, cyanosis, or edema MS- no significant deformity or atrophy Skin- no rash or lesion Psych- euthymic mood, full affect Neuro- strength and sensation are intact  EKG-  Sinus brady at 56  bpm, pr int 226 ms, qrs int 70 ms, qtc 426 ms.   1. Hypertrophic cardiomyopathy, sigmoid subtype. Basal septum measures 18  mm in maximal dimension. Flow acceleration noted at the LVOT, no LVOT  obstruction noted. Chordal systolic anterior motion of the mitral valve  without involvment of the anterior  leaflet.  2. Left ventricular ejection fraction, by visual estimation, is 60 to  65%. The left ventricle has normal function. There is severely increased  left ventricular hypertrophy.  3. Elevated left atrial and left ventricular end-diastolic pressures.  4. Left ventricular diastolic parameters are consistent with Grade III  diastolic dysfunction (restrictive).  5. Global right ventricle has normal systolic function.The right  ventricular size is normal. No increase in right ventricular wall  thickness.  6. Left atrial size was moderately dilated.  7. Right atrial size was moderately dilated.  8. The mitral valve is normal in structure. Mild mitral valve  regurgitation. No evidence of mitral stenosis.  9. The tricuspid valve is normal in structure. Tricuspid valve  regurgitation is mild.  10. The aortic valve is tricuspid. Aortic valve regurgitation is not  visualized. No evidence of aortic valve sclerosis or stenosis.  11. The pulmonic valve was normal in structure. Pulmonic valve  regurgitation is mild.  12. Moderately elevated pulmonary artery systolic pressure.  13. The inferior vena cava is dilated in size with <50% respiratory  variability, suggesting right atrial pressure of 15 mmHg.  14. The left ventricle has no regional wall motion abnormalities.  Echo- 2020----------------------------------------------------------------- Study Conclusions  - Left ventricle: The cavity size was normal. There was moderate   asymmetric hypertrophy of the septum. The appearance was   consistent with hypertrophic cardiomyopathy. Systolic function   was vigorous. The estimated  ejection fraction was in the range of   65% to 70%. There was no dynamic obstruction. Wall motion was   normal; there were no regional wall motion abnormalities. - Mitral valve: There was mild to moderate regurgitation directed   centrally. - Left atrium: The atrium was severely dilated. - Right ventricle: The cavity size was mildly dilated. - Right atrium: The atrium was mildly dilated. - Tricuspid valve: There was moderate regurgitation. - Pulmonary arteries: Systolic pressure was mildly increased. PA   peak pressure: 41 mm Hg (S).   Assessment and Plan: 1. Atrial  flutter  S/p  ablation 10/21  Went  into atrial flutter around 10 days ago,missed meds the  Friday pm before  Op note showed 3 atrial flutter circuits that could not be ablated He is on sotalol 120 mg bid  He was on  metoprolol succinate 12.5 mg qd, increased this to  12.5 mg bid  with stable BP He is now back in rhythm for several weeks  Continue eliquis 5 mg bid for a CHA2DS2VASc score of at least 3, do not  interrupt drug for the full 3 month recovery following ablation   2. HCM Stable  ICD Per Dr. Rayann Heman   F/u with Dr. Rayann Heman,  10/04/20 as scheduled   Geroge Baseman. Adelae Yodice, [facility] [address] [place],  [address] [phone]

## 2020-08-06 ENCOUNTER — Other Ambulatory Visit (HOSPITAL_COMMUNITY): Payer: Self-pay | Admitting: *Deleted

## 2020-08-06 DIAGNOSIS — R7401 Elevation of levels of liver transaminase levels: Secondary | ICD-10-CM | POA: Diagnosis not present

## 2020-08-06 MED ORDER — METOPROLOL SUCCINATE ER 25 MG PO TB24
12.5000 mg | ORAL_TABLET | Freq: Two times a day (BID) | ORAL | 3 refills | Status: DC
Start: 2020-08-06 — End: 2021-08-04

## 2020-08-20 DIAGNOSIS — M545 Low back pain, unspecified: Secondary | ICD-10-CM | POA: Diagnosis not present

## 2020-08-28 ENCOUNTER — Other Ambulatory Visit (HOSPITAL_COMMUNITY): Payer: Self-pay | Admitting: *Deleted

## 2020-08-28 MED ORDER — SOTALOL HCL 120 MG PO TABS
ORAL_TABLET | ORAL | 1 refills | Status: DC
Start: 2020-08-28 — End: 2021-06-23

## 2020-09-05 ENCOUNTER — Ambulatory Visit (INDEPENDENT_AMBULATORY_CARE_PROVIDER_SITE_OTHER): Payer: Medicare Other

## 2020-09-05 DIAGNOSIS — I4819 Other persistent atrial fibrillation: Secondary | ICD-10-CM | POA: Diagnosis not present

## 2020-09-05 LAB — CUP PACEART REMOTE DEVICE CHECK
Battery Remaining Longevity: 61 mo
Battery Remaining Percentage: 60 %
Battery Voltage: 2.96 V
Brady Statistic AP VP Percent: 1 %
Brady Statistic AP VS Percent: 18 %
Brady Statistic AS VP Percent: 1 %
Brady Statistic AS VS Percent: 81 %
Brady Statistic RA Percent Paced: 16 %
Brady Statistic RV Percent Paced: 1.6 %
Date Time Interrogation Session: 20211223020018
HighPow Impedance: 52 Ohm
HighPow Impedance: 52 Ohm
Implantable Lead Implant Date: 20141205
Implantable Lead Implant Date: 20141205
Implantable Lead Location: 753859
Implantable Lead Location: 753860
Implantable Lead Model: 7121
Implantable Pulse Generator Implant Date: 20171221
Lead Channel Impedance Value: 450 Ohm
Lead Channel Impedance Value: 560 Ohm
Lead Channel Pacing Threshold Amplitude: 0.5 V
Lead Channel Pacing Threshold Amplitude: 1 V
Lead Channel Pacing Threshold Pulse Width: 0.5 ms
Lead Channel Pacing Threshold Pulse Width: 0.5 ms
Lead Channel Sensing Intrinsic Amplitude: 12 mV
Lead Channel Sensing Intrinsic Amplitude: 5 mV
Lead Channel Setting Pacing Amplitude: 2 V
Lead Channel Setting Pacing Amplitude: 2.5 V
Lead Channel Setting Pacing Pulse Width: 0.5 ms
Lead Channel Setting Sensing Sensitivity: 0.5 mV
Pulse Gen Serial Number: 7387049

## 2020-09-18 NOTE — Progress Notes (Signed)
Remote ICD transmission.   

## 2020-10-02 DIAGNOSIS — Z95810 Presence of automatic (implantable) cardiac defibrillator: Secondary | ICD-10-CM | POA: Diagnosis not present

## 2020-10-02 DIAGNOSIS — I422 Other hypertrophic cardiomyopathy: Secondary | ICD-10-CM | POA: Diagnosis not present

## 2020-10-02 DIAGNOSIS — Z20822 Contact with and (suspected) exposure to covid-19: Secondary | ICD-10-CM | POA: Diagnosis not present

## 2020-10-02 DIAGNOSIS — R0981 Nasal congestion: Secondary | ICD-10-CM | POA: Diagnosis not present

## 2020-10-02 DIAGNOSIS — Z79899 Other long term (current) drug therapy: Secondary | ICD-10-CM | POA: Diagnosis not present

## 2020-10-02 DIAGNOSIS — R059 Cough, unspecified: Secondary | ICD-10-CM | POA: Diagnosis not present

## 2020-10-02 DIAGNOSIS — Z7901 Long term (current) use of anticoagulants: Secondary | ICD-10-CM | POA: Diagnosis not present

## 2020-10-02 DIAGNOSIS — J069 Acute upper respiratory infection, unspecified: Secondary | ICD-10-CM | POA: Diagnosis not present

## 2020-10-02 DIAGNOSIS — I4892 Unspecified atrial flutter: Secondary | ICD-10-CM | POA: Diagnosis not present

## 2020-10-02 DIAGNOSIS — J029 Acute pharyngitis, unspecified: Secondary | ICD-10-CM | POA: Diagnosis not present

## 2020-10-02 DIAGNOSIS — U071 COVID-19: Secondary | ICD-10-CM | POA: Diagnosis not present

## 2020-10-02 DIAGNOSIS — I4891 Unspecified atrial fibrillation: Secondary | ICD-10-CM | POA: Diagnosis not present

## 2020-10-02 DIAGNOSIS — Z87891 Personal history of nicotine dependence: Secondary | ICD-10-CM | POA: Diagnosis not present

## 2020-10-02 DIAGNOSIS — R519 Headache, unspecified: Secondary | ICD-10-CM | POA: Diagnosis not present

## 2020-10-02 DIAGNOSIS — R9431 Abnormal electrocardiogram [ECG] [EKG]: Secondary | ICD-10-CM | POA: Diagnosis not present

## 2020-10-04 ENCOUNTER — Ambulatory Visit: Payer: Medicare Other | Admitting: Internal Medicine

## 2020-10-15 DIAGNOSIS — H6123 Impacted cerumen, bilateral: Secondary | ICD-10-CM | POA: Diagnosis not present

## 2020-10-16 ENCOUNTER — Other Ambulatory Visit: Payer: Self-pay

## 2020-10-16 ENCOUNTER — Encounter: Payer: Self-pay | Admitting: Internal Medicine

## 2020-10-16 ENCOUNTER — Ambulatory Visit (INDEPENDENT_AMBULATORY_CARE_PROVIDER_SITE_OTHER): Payer: Medicare Other | Admitting: Internal Medicine

## 2020-10-16 DIAGNOSIS — I472 Ventricular tachycardia, unspecified: Secondary | ICD-10-CM

## 2020-10-16 DIAGNOSIS — Z95810 Presence of automatic (implantable) cardiac defibrillator: Secondary | ICD-10-CM

## 2020-10-16 LAB — CUP PACEART INCLINIC DEVICE CHECK
Battery Remaining Longevity: 62 mo
Brady Statistic RA Percent Paced: 21 %
Brady Statistic RV Percent Paced: 1.2 %
Date Time Interrogation Session: 20220202095503
HighPow Impedance: 54.5464
Implantable Lead Implant Date: 20141205
Implantable Lead Implant Date: 20141205
Implantable Lead Location: 753859
Implantable Lead Location: 753860
Implantable Lead Model: 7121
Implantable Pulse Generator Implant Date: 20171221
Lead Channel Impedance Value: 462.5 Ohm
Lead Channel Impedance Value: 562.5 Ohm
Lead Channel Pacing Threshold Amplitude: 0.5 V
Lead Channel Pacing Threshold Amplitude: 0.5 V
Lead Channel Pacing Threshold Pulse Width: 0.5 ms
Lead Channel Pacing Threshold Pulse Width: 0.5 ms
Lead Channel Sensing Intrinsic Amplitude: 12 mV
Lead Channel Sensing Intrinsic Amplitude: 5 mV
Lead Channel Setting Pacing Amplitude: 2 V
Lead Channel Setting Pacing Amplitude: 2.5 V
Lead Channel Setting Pacing Pulse Width: 0.5 ms
Lead Channel Setting Sensing Sensitivity: 0.5 mV
Pulse Gen Serial Number: 7387049

## 2020-10-16 NOTE — Progress Notes (Signed)
PCP: Marton Redwood, MD Primary Cardiologist: Dr Job Founds is a 75 y.o. male who presents today for routine electrophysiology followup.  Since his recent afib ablation, the patient reports doing very well.  he denies procedure related complications and is pleased with the results of the procedure.  Today, he denies symptoms of palpitations, chest pain, shortness of breath,  lower extremity edema, dizziness, presyncope, or syncope.  The patient is otherwise without complaint today.   Past Medical History:  Diagnosis Date  . Acute cholecystitis 06/21/2011  . Allergy    SEASONAL  . Cataract   . Complication of anesthesia    HALLUCINATIONS  . GERD (gastroesophageal reflux disease)   . Hearing loss   . Hemorrhoids   . Hiatal hernia   . Hypertrophic cardiomyopathy ([place])   . Mitral regurgitation    Moderate  . Paroxysmal atrial fibrillation ([place]) 04/2015   documented on ICD remote interrogation, treated with sotalol  . Septal defect    LV Septal hyptertrophy 38mm, normal EF, minimal LVOT gradiant.  . Tubular adenoma of colon   . VT (ventricular tachycardia) ([place]) 08/18/2013   s/p St. Jude ICD   Past Surgical History:  Procedure Laterality Date  . APPENDECTOMY  1950  . ATRIAL FIBRILLATION ABLATION N/A 07/04/2020   Procedure: ATRIAL FIBRILLATION ABLATION;  Surgeon: [name], MD;  Location: [facility];  Service: Cardiovascular;  Laterality: N/A;  . CARDIOVERSION N/A 02/25/2017   Procedure: CARDIOVERSION;  Surgeon: Fay Records, MD;  Location: [facility];  Service: Cardiovascular;  Laterality: N/A;  . CHOLECYSTECTOMY  06/24/2011  . COLONOSCOPY    . EP IMPLANTABLE DEVICE N/A 09/03/2016   SJM Fortify Assura Dr generator change by Dr Rayann Heman.  ICD implanted for secondary prevention of sudden death  . IMPLANTABLE CARDIOVERTER DEFIBRILLATOR IMPLANT  08/18/2013   St. Jude Fortify Assura DR ICD implanted by Dr Rayann Heman for secondary treatment of VT  . IMPLANTABLE  CARDIOVERTER DEFIBRILLATOR IMPLANT N/A 08/18/2013   Procedure: IMPLANTABLE CARDIOVERTER DEFIBRILLATOR IMPLANT;  Surgeon: Coralyn Mark, MD;  Location: [facility];  Service: Cardiovascular;  Laterality: N/A;    ROS- all systems are personally reviewed and negatives except as per HPI above  Current Outpatient Medications  Medication Sig Dispense Refill  . calcium carbonate (OSCAL) 1500 (600 Ca) MG TABS tablet Take 600 mg by mouth daily.    . cholecalciferol (VITAMIN D) 25 MCG (1000 UNIT) tablet Take 1,000 Units by mouth in the morning and at bedtime.    . Coenzyme Q10 (CO Q 10) 100 MG CAPS Take 100 mg by mouth daily.    Marland Kitchen ELIQUIS 5 MG TABS tablet TAKE 1 TABLET BY MOUTH TWICE DAILY 60 tablet 6  . Glucosamine-Chondroitin (COSAMIN DS PO) Take 1 tablet by mouth daily.    . metoprolol succinate (TOPROL-XL) 25 MG 24 hr tablet Take 0.5 tablets (12.5 mg total) by mouth in the morning and at bedtime. 135 tablet 3  . Omeprazole Magnesium (PRILOSEC OTC PO) Take 10 mg by mouth daily.     . Probiotic Product (ALIGN PO) Take 1 capsule by mouth daily.    . sotalol (BETAPACE) 120 MG tablet TAKE 1 TABLET(120 MG) BY MOUTH TWICE DAILY 180 tablet 1  . Zinc 50 MG CAPS Take 50 mg by mouth 2 (two) times daily.      No current facility-administered medications for this visit.    Physical Exam: Vitals:   10/16/20 0945  BP: 110/66  Pulse: 63  SpO2:  98%  Weight: 170 lb 6.4 oz (77.3 kg)  Height: 5\' 10"  (1.778 m)    GEN- The patient is well appearing, alert and oriented x 3 today.   Head- normocephalic, atraumatic Eyes-  Sclera clear, conjunctiva pink Ears- hearing intact Oropharynx- clear Lungs- Clear to ausculation bilaterally, normal work of breathing Heart- Regular rate and rhythm, no murmurs, rubs or gallops, PMI not laterally displaced GI- soft, NT, ND, + BS Extremities- no clubbing, cyanosis, or edema  EKG tracing ordered today is personally reviewed and shows sinus rhythm with PR 222 msec, Qtc  419 msec  Assessment and Plan:  1. Paroxysmal atrial fibrillation and atrial flutter Doing well s/p ablation chads2vasc score is 3.  Continue eliquis AF burden by ICD interrogation is 6%, though this was mostly ERAF early post ablation.  2. HCM with h/o VT Doing very well with Sotalol ICD interrogation is reviewed and normal today (see PaceArt for details) We will follow closely on sotalol to avoid toxicity Labs 11/21 reviewed  Return to see me in 3 months  [name], [facility] 10/16/2020 10:01 AM

## 2020-10-16 NOTE — Patient Instructions (Addendum)
Medication Instructions:  Your physician recommends that you continue on your current medications as directed. Please refer to the Current Medication list given to you today.  Labwork: None ordered.  Testing/Procedures: None ordered.  Follow-Up: Your physician wants you to follow-up in: 01/15/21 at 2:15 pm with Dr. Rayann Heman  Remote monitoring is used to monitor your  ICD from home. This monitoring reduces the number of office visits required to check your device to one time per year. It allows [place] to keep an eye on the functioning of your device to ensure it is working properly. You are scheduled for a device check from home on 12/05/20. You may send your transmission at any time that day. If you have a wireless device, the transmission will be sent automatically. After your physician reviews your transmission, you will receive a postcard with your next transmission date.  Any Other Special Instructions Will Be Listed Below (If Applicable).  If you need a refill on your cardiac medications before your next appointment, please call your pharmacy.

## 2020-10-30 DIAGNOSIS — M791 Myalgia, unspecified site: Secondary | ICD-10-CM | POA: Diagnosis not present

## 2020-10-30 DIAGNOSIS — M9902 Segmental and somatic dysfunction of thoracic region: Secondary | ICD-10-CM | POA: Diagnosis not present

## 2020-10-30 DIAGNOSIS — R293 Abnormal posture: Secondary | ICD-10-CM | POA: Diagnosis not present

## 2020-10-30 DIAGNOSIS — M9903 Segmental and somatic dysfunction of lumbar region: Secondary | ICD-10-CM | POA: Diagnosis not present

## 2020-10-30 DIAGNOSIS — M50222 Other cervical disc displacement at C5-C6 level: Secondary | ICD-10-CM | POA: Diagnosis not present

## 2020-10-30 DIAGNOSIS — M5137 Other intervertebral disc degeneration, lumbosacral region: Secondary | ICD-10-CM | POA: Diagnosis not present

## 2020-10-30 DIAGNOSIS — M9901 Segmental and somatic dysfunction of cervical region: Secondary | ICD-10-CM | POA: Diagnosis not present

## 2020-10-30 DIAGNOSIS — M542 Cervicalgia: Secondary | ICD-10-CM | POA: Diagnosis not present

## 2020-10-30 DIAGNOSIS — M5459 Other low back pain: Secondary | ICD-10-CM | POA: Diagnosis not present

## 2020-11-04 DIAGNOSIS — M5137 Other intervertebral disc degeneration, lumbosacral region: Secondary | ICD-10-CM | POA: Diagnosis not present

## 2020-11-04 DIAGNOSIS — M50222 Other cervical disc displacement at C5-C6 level: Secondary | ICD-10-CM | POA: Diagnosis not present

## 2020-11-04 DIAGNOSIS — M542 Cervicalgia: Secondary | ICD-10-CM | POA: Diagnosis not present

## 2020-11-04 DIAGNOSIS — R293 Abnormal posture: Secondary | ICD-10-CM | POA: Diagnosis not present

## 2020-11-04 DIAGNOSIS — M5459 Other low back pain: Secondary | ICD-10-CM | POA: Diagnosis not present

## 2020-11-04 DIAGNOSIS — M9901 Segmental and somatic dysfunction of cervical region: Secondary | ICD-10-CM | POA: Diagnosis not present

## 2020-11-04 DIAGNOSIS — M9903 Segmental and somatic dysfunction of lumbar region: Secondary | ICD-10-CM | POA: Diagnosis not present

## 2020-11-04 DIAGNOSIS — M791 Myalgia, unspecified site: Secondary | ICD-10-CM | POA: Diagnosis not present

## 2020-11-04 DIAGNOSIS — M9902 Segmental and somatic dysfunction of thoracic region: Secondary | ICD-10-CM | POA: Diagnosis not present

## 2020-11-08 DIAGNOSIS — M5137 Other intervertebral disc degeneration, lumbosacral region: Secondary | ICD-10-CM | POA: Diagnosis not present

## 2020-11-08 DIAGNOSIS — M542 Cervicalgia: Secondary | ICD-10-CM | POA: Diagnosis not present

## 2020-11-08 DIAGNOSIS — R293 Abnormal posture: Secondary | ICD-10-CM | POA: Diagnosis not present

## 2020-11-08 DIAGNOSIS — M5459 Other low back pain: Secondary | ICD-10-CM | POA: Diagnosis not present

## 2020-11-08 DIAGNOSIS — M9901 Segmental and somatic dysfunction of cervical region: Secondary | ICD-10-CM | POA: Diagnosis not present

## 2020-11-08 DIAGNOSIS — M50222 Other cervical disc displacement at C5-C6 level: Secondary | ICD-10-CM | POA: Diagnosis not present

## 2020-11-08 DIAGNOSIS — M9903 Segmental and somatic dysfunction of lumbar region: Secondary | ICD-10-CM | POA: Diagnosis not present

## 2020-11-08 DIAGNOSIS — M791 Myalgia, unspecified site: Secondary | ICD-10-CM | POA: Diagnosis not present

## 2020-11-08 DIAGNOSIS — M9902 Segmental and somatic dysfunction of thoracic region: Secondary | ICD-10-CM | POA: Diagnosis not present

## 2020-11-11 DIAGNOSIS — M9903 Segmental and somatic dysfunction of lumbar region: Secondary | ICD-10-CM | POA: Diagnosis not present

## 2020-11-11 DIAGNOSIS — M9901 Segmental and somatic dysfunction of cervical region: Secondary | ICD-10-CM | POA: Diagnosis not present

## 2020-11-11 DIAGNOSIS — M542 Cervicalgia: Secondary | ICD-10-CM | POA: Diagnosis not present

## 2020-11-11 DIAGNOSIS — M5137 Other intervertebral disc degeneration, lumbosacral region: Secondary | ICD-10-CM | POA: Diagnosis not present

## 2020-11-11 DIAGNOSIS — M791 Myalgia, unspecified site: Secondary | ICD-10-CM | POA: Diagnosis not present

## 2020-11-11 DIAGNOSIS — M50222 Other cervical disc displacement at C5-C6 level: Secondary | ICD-10-CM | POA: Diagnosis not present

## 2020-11-11 DIAGNOSIS — M5459 Other low back pain: Secondary | ICD-10-CM | POA: Diagnosis not present

## 2020-11-11 DIAGNOSIS — R293 Abnormal posture: Secondary | ICD-10-CM | POA: Diagnosis not present

## 2020-11-11 DIAGNOSIS — M9902 Segmental and somatic dysfunction of thoracic region: Secondary | ICD-10-CM | POA: Diagnosis not present

## 2020-11-15 DIAGNOSIS — M9901 Segmental and somatic dysfunction of cervical region: Secondary | ICD-10-CM | POA: Diagnosis not present

## 2020-11-15 DIAGNOSIS — M5459 Other low back pain: Secondary | ICD-10-CM | POA: Diagnosis not present

## 2020-11-15 DIAGNOSIS — R293 Abnormal posture: Secondary | ICD-10-CM | POA: Diagnosis not present

## 2020-11-15 DIAGNOSIS — M9902 Segmental and somatic dysfunction of thoracic region: Secondary | ICD-10-CM | POA: Diagnosis not present

## 2020-11-15 DIAGNOSIS — M5137 Other intervertebral disc degeneration, lumbosacral region: Secondary | ICD-10-CM | POA: Diagnosis not present

## 2020-11-15 DIAGNOSIS — M542 Cervicalgia: Secondary | ICD-10-CM | POA: Diagnosis not present

## 2020-11-15 DIAGNOSIS — M50222 Other cervical disc displacement at C5-C6 level: Secondary | ICD-10-CM | POA: Diagnosis not present

## 2020-11-15 DIAGNOSIS — M9903 Segmental and somatic dysfunction of lumbar region: Secondary | ICD-10-CM | POA: Diagnosis not present

## 2020-11-15 DIAGNOSIS — M791 Myalgia, unspecified site: Secondary | ICD-10-CM | POA: Diagnosis not present

## 2020-11-18 DIAGNOSIS — M791 Myalgia, unspecified site: Secondary | ICD-10-CM | POA: Diagnosis not present

## 2020-11-18 DIAGNOSIS — M9901 Segmental and somatic dysfunction of cervical region: Secondary | ICD-10-CM | POA: Diagnosis not present

## 2020-11-18 DIAGNOSIS — R293 Abnormal posture: Secondary | ICD-10-CM | POA: Diagnosis not present

## 2020-11-18 DIAGNOSIS — M9903 Segmental and somatic dysfunction of lumbar region: Secondary | ICD-10-CM | POA: Diagnosis not present

## 2020-11-18 DIAGNOSIS — M5459 Other low back pain: Secondary | ICD-10-CM | POA: Diagnosis not present

## 2020-11-18 DIAGNOSIS — M5137 Other intervertebral disc degeneration, lumbosacral region: Secondary | ICD-10-CM | POA: Diagnosis not present

## 2020-11-18 DIAGNOSIS — M542 Cervicalgia: Secondary | ICD-10-CM | POA: Diagnosis not present

## 2020-11-18 DIAGNOSIS — M50222 Other cervical disc displacement at C5-C6 level: Secondary | ICD-10-CM | POA: Diagnosis not present

## 2020-11-18 DIAGNOSIS — M9902 Segmental and somatic dysfunction of thoracic region: Secondary | ICD-10-CM | POA: Diagnosis not present

## 2020-11-22 DIAGNOSIS — M5459 Other low back pain: Secondary | ICD-10-CM | POA: Diagnosis not present

## 2020-11-22 DIAGNOSIS — R293 Abnormal posture: Secondary | ICD-10-CM | POA: Diagnosis not present

## 2020-11-22 DIAGNOSIS — M5137 Other intervertebral disc degeneration, lumbosacral region: Secondary | ICD-10-CM | POA: Diagnosis not present

## 2020-11-22 DIAGNOSIS — M9901 Segmental and somatic dysfunction of cervical region: Secondary | ICD-10-CM | POA: Diagnosis not present

## 2020-11-22 DIAGNOSIS — M791 Myalgia, unspecified site: Secondary | ICD-10-CM | POA: Diagnosis not present

## 2020-11-22 DIAGNOSIS — M542 Cervicalgia: Secondary | ICD-10-CM | POA: Diagnosis not present

## 2020-11-22 DIAGNOSIS — M9902 Segmental and somatic dysfunction of thoracic region: Secondary | ICD-10-CM | POA: Diagnosis not present

## 2020-11-22 DIAGNOSIS — M50222 Other cervical disc displacement at C5-C6 level: Secondary | ICD-10-CM | POA: Diagnosis not present

## 2020-11-22 DIAGNOSIS — M9903 Segmental and somatic dysfunction of lumbar region: Secondary | ICD-10-CM | POA: Diagnosis not present

## 2020-11-25 DIAGNOSIS — M5459 Other low back pain: Secondary | ICD-10-CM | POA: Diagnosis not present

## 2020-11-25 DIAGNOSIS — M5137 Other intervertebral disc degeneration, lumbosacral region: Secondary | ICD-10-CM | POA: Diagnosis not present

## 2020-11-25 DIAGNOSIS — M9903 Segmental and somatic dysfunction of lumbar region: Secondary | ICD-10-CM | POA: Diagnosis not present

## 2020-11-25 DIAGNOSIS — M791 Myalgia, unspecified site: Secondary | ICD-10-CM | POA: Diagnosis not present

## 2020-11-25 DIAGNOSIS — M542 Cervicalgia: Secondary | ICD-10-CM | POA: Diagnosis not present

## 2020-11-25 DIAGNOSIS — M50222 Other cervical disc displacement at C5-C6 level: Secondary | ICD-10-CM | POA: Diagnosis not present

## 2020-11-25 DIAGNOSIS — M9901 Segmental and somatic dysfunction of cervical region: Secondary | ICD-10-CM | POA: Diagnosis not present

## 2020-11-25 DIAGNOSIS — R293 Abnormal posture: Secondary | ICD-10-CM | POA: Diagnosis not present

## 2020-11-25 DIAGNOSIS — M9902 Segmental and somatic dysfunction of thoracic region: Secondary | ICD-10-CM | POA: Diagnosis not present

## 2020-11-29 DIAGNOSIS — M9901 Segmental and somatic dysfunction of cervical region: Secondary | ICD-10-CM | POA: Diagnosis not present

## 2020-11-29 DIAGNOSIS — R293 Abnormal posture: Secondary | ICD-10-CM | POA: Diagnosis not present

## 2020-11-29 DIAGNOSIS — M5137 Other intervertebral disc degeneration, lumbosacral region: Secondary | ICD-10-CM | POA: Diagnosis not present

## 2020-11-29 DIAGNOSIS — M5459 Other low back pain: Secondary | ICD-10-CM | POA: Diagnosis not present

## 2020-11-29 DIAGNOSIS — M542 Cervicalgia: Secondary | ICD-10-CM | POA: Diagnosis not present

## 2020-11-29 DIAGNOSIS — M50222 Other cervical disc displacement at C5-C6 level: Secondary | ICD-10-CM | POA: Diagnosis not present

## 2020-11-29 DIAGNOSIS — M9902 Segmental and somatic dysfunction of thoracic region: Secondary | ICD-10-CM | POA: Diagnosis not present

## 2020-11-29 DIAGNOSIS — M9903 Segmental and somatic dysfunction of lumbar region: Secondary | ICD-10-CM | POA: Diagnosis not present

## 2020-11-29 DIAGNOSIS — M791 Myalgia, unspecified site: Secondary | ICD-10-CM | POA: Diagnosis not present

## 2020-12-02 DIAGNOSIS — M5137 Other intervertebral disc degeneration, lumbosacral region: Secondary | ICD-10-CM | POA: Diagnosis not present

## 2020-12-02 DIAGNOSIS — R293 Abnormal posture: Secondary | ICD-10-CM | POA: Diagnosis not present

## 2020-12-02 DIAGNOSIS — M9901 Segmental and somatic dysfunction of cervical region: Secondary | ICD-10-CM | POA: Diagnosis not present

## 2020-12-02 DIAGNOSIS — M9902 Segmental and somatic dysfunction of thoracic region: Secondary | ICD-10-CM | POA: Diagnosis not present

## 2020-12-02 DIAGNOSIS — M9903 Segmental and somatic dysfunction of lumbar region: Secondary | ICD-10-CM | POA: Diagnosis not present

## 2020-12-02 DIAGNOSIS — M5459 Other low back pain: Secondary | ICD-10-CM | POA: Diagnosis not present

## 2020-12-02 DIAGNOSIS — M542 Cervicalgia: Secondary | ICD-10-CM | POA: Diagnosis not present

## 2020-12-02 DIAGNOSIS — M50222 Other cervical disc displacement at C5-C6 level: Secondary | ICD-10-CM | POA: Diagnosis not present

## 2020-12-02 DIAGNOSIS — M791 Myalgia, unspecified site: Secondary | ICD-10-CM | POA: Diagnosis not present

## 2020-12-05 ENCOUNTER — Ambulatory Visit (INDEPENDENT_AMBULATORY_CARE_PROVIDER_SITE_OTHER): Payer: Medicare Other

## 2020-12-05 DIAGNOSIS — I472 Ventricular tachycardia, unspecified: Secondary | ICD-10-CM

## 2020-12-07 LAB — CUP PACEART REMOTE DEVICE CHECK
Battery Remaining Longevity: 59 mo
Battery Remaining Percentage: 57 %
Battery Voltage: 2.96 V
Brady Statistic AP VP Percent: 1 %
Brady Statistic AP VS Percent: 33 %
Brady Statistic AS VP Percent: 1 %
Brady Statistic AS VS Percent: 66 %
Brady Statistic RA Percent Paced: 33 %
Brady Statistic RV Percent Paced: 1.2 %
Date Time Interrogation Session: 20220324020014
HighPow Impedance: 52 Ohm
HighPow Impedance: 52 Ohm
Implantable Lead Implant Date: 20141205
Implantable Lead Implant Date: 20141205
Implantable Lead Location: 753859
Implantable Lead Location: 753860
Implantable Lead Model: 7121
Implantable Pulse Generator Implant Date: 20171221
Lead Channel Impedance Value: 440 Ohm
Lead Channel Impedance Value: 580 Ohm
Lead Channel Pacing Threshold Amplitude: 0.5 V
Lead Channel Pacing Threshold Amplitude: 0.5 V
Lead Channel Pacing Threshold Pulse Width: 0.5 ms
Lead Channel Pacing Threshold Pulse Width: 0.5 ms
Lead Channel Sensing Intrinsic Amplitude: 12 mV
Lead Channel Sensing Intrinsic Amplitude: 4.4 mV
Lead Channel Setting Pacing Amplitude: 2 V
Lead Channel Setting Pacing Amplitude: 2.5 V
Lead Channel Setting Pacing Pulse Width: 0.5 ms
Lead Channel Setting Sensing Sensitivity: 0.5 mV
Pulse Gen Serial Number: 7387049

## 2020-12-09 DIAGNOSIS — M5137 Other intervertebral disc degeneration, lumbosacral region: Secondary | ICD-10-CM | POA: Diagnosis not present

## 2020-12-09 DIAGNOSIS — M5459 Other low back pain: Secondary | ICD-10-CM | POA: Diagnosis not present

## 2020-12-09 DIAGNOSIS — M791 Myalgia, unspecified site: Secondary | ICD-10-CM | POA: Diagnosis not present

## 2020-12-09 DIAGNOSIS — M9901 Segmental and somatic dysfunction of cervical region: Secondary | ICD-10-CM | POA: Diagnosis not present

## 2020-12-09 DIAGNOSIS — M9902 Segmental and somatic dysfunction of thoracic region: Secondary | ICD-10-CM | POA: Diagnosis not present

## 2020-12-09 DIAGNOSIS — M542 Cervicalgia: Secondary | ICD-10-CM | POA: Diagnosis not present

## 2020-12-09 DIAGNOSIS — R293 Abnormal posture: Secondary | ICD-10-CM | POA: Diagnosis not present

## 2020-12-09 DIAGNOSIS — M50222 Other cervical disc displacement at C5-C6 level: Secondary | ICD-10-CM | POA: Diagnosis not present

## 2020-12-09 DIAGNOSIS — M9903 Segmental and somatic dysfunction of lumbar region: Secondary | ICD-10-CM | POA: Diagnosis not present

## 2020-12-16 DIAGNOSIS — R293 Abnormal posture: Secondary | ICD-10-CM | POA: Diagnosis not present

## 2020-12-16 DIAGNOSIS — M5459 Other low back pain: Secondary | ICD-10-CM | POA: Diagnosis not present

## 2020-12-16 DIAGNOSIS — M50222 Other cervical disc displacement at C5-C6 level: Secondary | ICD-10-CM | POA: Diagnosis not present

## 2020-12-16 DIAGNOSIS — M5137 Other intervertebral disc degeneration, lumbosacral region: Secondary | ICD-10-CM | POA: Diagnosis not present

## 2020-12-16 DIAGNOSIS — M9901 Segmental and somatic dysfunction of cervical region: Secondary | ICD-10-CM | POA: Diagnosis not present

## 2020-12-16 DIAGNOSIS — M542 Cervicalgia: Secondary | ICD-10-CM | POA: Diagnosis not present

## 2020-12-16 DIAGNOSIS — M9902 Segmental and somatic dysfunction of thoracic region: Secondary | ICD-10-CM | POA: Diagnosis not present

## 2020-12-16 DIAGNOSIS — M791 Myalgia, unspecified site: Secondary | ICD-10-CM | POA: Diagnosis not present

## 2020-12-16 DIAGNOSIS — M9903 Segmental and somatic dysfunction of lumbar region: Secondary | ICD-10-CM | POA: Diagnosis not present

## 2020-12-17 NOTE — Progress Notes (Signed)
Remote ICD transmission.   

## 2020-12-19 ENCOUNTER — Other Ambulatory Visit: Payer: Self-pay | Admitting: Internal Medicine

## 2020-12-19 NOTE — Telephone Encounter (Signed)
Pt last saw Roderic Palau, NP 08/02/20, last labs 06/12/20 Creat 1.28, age 75, weight 77.3kg, based on specified criteria pt is on appropriate dosage of Eliquis 5mg  BID.  Will refill rx.

## 2020-12-23 DIAGNOSIS — M9902 Segmental and somatic dysfunction of thoracic region: Secondary | ICD-10-CM | POA: Diagnosis not present

## 2020-12-23 DIAGNOSIS — M50222 Other cervical disc displacement at C5-C6 level: Secondary | ICD-10-CM | POA: Diagnosis not present

## 2020-12-23 DIAGNOSIS — M9903 Segmental and somatic dysfunction of lumbar region: Secondary | ICD-10-CM | POA: Diagnosis not present

## 2020-12-23 DIAGNOSIS — M791 Myalgia, unspecified site: Secondary | ICD-10-CM | POA: Diagnosis not present

## 2020-12-23 DIAGNOSIS — R293 Abnormal posture: Secondary | ICD-10-CM | POA: Diagnosis not present

## 2020-12-23 DIAGNOSIS — M5459 Other low back pain: Secondary | ICD-10-CM | POA: Diagnosis not present

## 2020-12-23 DIAGNOSIS — M9901 Segmental and somatic dysfunction of cervical region: Secondary | ICD-10-CM | POA: Diagnosis not present

## 2020-12-23 DIAGNOSIS — M542 Cervicalgia: Secondary | ICD-10-CM | POA: Diagnosis not present

## 2020-12-23 DIAGNOSIS — M5137 Other intervertebral disc degeneration, lumbosacral region: Secondary | ICD-10-CM | POA: Diagnosis not present

## 2020-12-30 DIAGNOSIS — M9901 Segmental and somatic dysfunction of cervical region: Secondary | ICD-10-CM | POA: Diagnosis not present

## 2020-12-30 DIAGNOSIS — R293 Abnormal posture: Secondary | ICD-10-CM | POA: Diagnosis not present

## 2020-12-30 DIAGNOSIS — M50222 Other cervical disc displacement at C5-C6 level: Secondary | ICD-10-CM | POA: Diagnosis not present

## 2020-12-30 DIAGNOSIS — M9902 Segmental and somatic dysfunction of thoracic region: Secondary | ICD-10-CM | POA: Diagnosis not present

## 2020-12-30 DIAGNOSIS — M791 Myalgia, unspecified site: Secondary | ICD-10-CM | POA: Diagnosis not present

## 2020-12-30 DIAGNOSIS — M542 Cervicalgia: Secondary | ICD-10-CM | POA: Diagnosis not present

## 2020-12-30 DIAGNOSIS — M5137 Other intervertebral disc degeneration, lumbosacral region: Secondary | ICD-10-CM | POA: Diagnosis not present

## 2020-12-30 DIAGNOSIS — M9903 Segmental and somatic dysfunction of lumbar region: Secondary | ICD-10-CM | POA: Diagnosis not present

## 2020-12-30 DIAGNOSIS — M5459 Other low back pain: Secondary | ICD-10-CM | POA: Diagnosis not present

## 2021-01-06 DIAGNOSIS — M5459 Other low back pain: Secondary | ICD-10-CM | POA: Diagnosis not present

## 2021-01-06 DIAGNOSIS — M791 Myalgia, unspecified site: Secondary | ICD-10-CM | POA: Diagnosis not present

## 2021-01-06 DIAGNOSIS — M50222 Other cervical disc displacement at C5-C6 level: Secondary | ICD-10-CM | POA: Diagnosis not present

## 2021-01-06 DIAGNOSIS — M5137 Other intervertebral disc degeneration, lumbosacral region: Secondary | ICD-10-CM | POA: Diagnosis not present

## 2021-01-06 DIAGNOSIS — M9901 Segmental and somatic dysfunction of cervical region: Secondary | ICD-10-CM | POA: Diagnosis not present

## 2021-01-06 DIAGNOSIS — M9902 Segmental and somatic dysfunction of thoracic region: Secondary | ICD-10-CM | POA: Diagnosis not present

## 2021-01-06 DIAGNOSIS — M542 Cervicalgia: Secondary | ICD-10-CM | POA: Diagnosis not present

## 2021-01-06 DIAGNOSIS — R293 Abnormal posture: Secondary | ICD-10-CM | POA: Diagnosis not present

## 2021-01-06 DIAGNOSIS — M9903 Segmental and somatic dysfunction of lumbar region: Secondary | ICD-10-CM | POA: Diagnosis not present

## 2021-01-13 DIAGNOSIS — M5459 Other low back pain: Secondary | ICD-10-CM | POA: Diagnosis not present

## 2021-01-13 DIAGNOSIS — M9902 Segmental and somatic dysfunction of thoracic region: Secondary | ICD-10-CM | POA: Diagnosis not present

## 2021-01-13 DIAGNOSIS — M50222 Other cervical disc displacement at C5-C6 level: Secondary | ICD-10-CM | POA: Diagnosis not present

## 2021-01-13 DIAGNOSIS — M542 Cervicalgia: Secondary | ICD-10-CM | POA: Diagnosis not present

## 2021-01-13 DIAGNOSIS — R293 Abnormal posture: Secondary | ICD-10-CM | POA: Diagnosis not present

## 2021-01-13 DIAGNOSIS — M9903 Segmental and somatic dysfunction of lumbar region: Secondary | ICD-10-CM | POA: Diagnosis not present

## 2021-01-13 DIAGNOSIS — M5137 Other intervertebral disc degeneration, lumbosacral region: Secondary | ICD-10-CM | POA: Diagnosis not present

## 2021-01-13 DIAGNOSIS — M791 Myalgia, unspecified site: Secondary | ICD-10-CM | POA: Diagnosis not present

## 2021-01-13 DIAGNOSIS — M9901 Segmental and somatic dysfunction of cervical region: Secondary | ICD-10-CM | POA: Diagnosis not present

## 2021-01-15 ENCOUNTER — Encounter: Payer: Self-pay | Admitting: Internal Medicine

## 2021-01-15 ENCOUNTER — Ambulatory Visit (INDEPENDENT_AMBULATORY_CARE_PROVIDER_SITE_OTHER): Payer: Medicare Other | Admitting: Internal Medicine

## 2021-01-15 ENCOUNTER — Other Ambulatory Visit: Payer: Self-pay

## 2021-01-15 VITALS — BP 108/60 | HR 59 | Ht 70.0 in | Wt 173.8 lb

## 2021-01-15 DIAGNOSIS — I480 Paroxysmal atrial fibrillation: Secondary | ICD-10-CM

## 2021-01-15 DIAGNOSIS — I472 Ventricular tachycardia, unspecified: Secondary | ICD-10-CM

## 2021-01-15 DIAGNOSIS — I422 Other hypertrophic cardiomyopathy: Secondary | ICD-10-CM

## 2021-01-15 DIAGNOSIS — I484 Atypical atrial flutter: Secondary | ICD-10-CM

## 2021-01-15 NOTE — Patient Instructions (Addendum)
Medication Instructions:  Your physician recommends that you continue on your current medications as directed. Please refer to the Current Medication list given to you today.  Labwork: None ordered.  Testing/Procedures: None ordered.  Follow-Up: Your physician wants you to follow-up in:  6 months with the Afib Clinic. They will contact you to schedule.   Remote monitoring is used to monitor your Pacemaker of ICD from home. This monitoring reduces the number of office visits required to check your device to one time per year. It allows [place] to keep an eye on the functioning of your device to ensure it is working properly. You are scheduled for a device check from home on 03/06/21. You may send your transmission at any time that day. If you have a wireless device, the transmission will be sent automatically. After your physician reviews your transmission, you will receive a postcard with your next transmission date.  Any Other Special Instructions Will Be Listed Below (If Applicable).  If you need a refill on your cardiac medications before your next appointment, please call your pharmacy.

## 2021-01-15 NOTE — Progress Notes (Signed)
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 male who presents today for routine electrophysiology followup.  Since last being seen in our clinic, the patient reports doing very well. His primary concern is with back pain.  He has had no symptoms of afib.  Today, he denies symptoms of palpitations, chest pain, shortness of breath,  lower extremity edema, dizziness, presyncope, syncope, or ICD shocks.  The patient is otherwise without complaint today.   Past Medical History:  Diagnosis Date  . Acute cholecystitis 06/21/2011  . Allergy    SEASONAL  . Cataract   . Complication of anesthesia    HALLUCINATIONS  . GERD (gastroesophageal reflux disease)   . Hearing loss   . Hemorrhoids   . Hiatal hernia   . Hypertrophic cardiomyopathy ([place])   . Mitral regurgitation    Moderate  . Paroxysmal atrial fibrillation ([place]) 04/2015   documented on ICD remote interrogation, treated with sotalol  . Septal defect    LV Septal hyptertrophy 76mm, normal EF, minimal LVOT gradiant.  . Tubular adenoma of colon   . VT (ventricular tachycardia) ([place]) 08/18/2013   s/p St. Jude ICD   Past Surgical History:  Procedure Laterality Date  . APPENDECTOMY  1950  . ATRIAL FIBRILLATION ABLATION N/A 07/04/2020   Procedure: ATRIAL FIBRILLATION ABLATION;  Surgeon: [name], MD;  Location: [facility];  Service: Cardiovascular;  Laterality: N/A;  . CARDIOVERSION N/A 02/25/2017   Procedure: CARDIOVERSION;  Surgeon: Fay Records, MD;  Location: [facility];  Service: Cardiovascular;  Laterality: N/A;  . CHOLECYSTECTOMY  06/24/2011  . COLONOSCOPY    . EP IMPLANTABLE DEVICE N/A 09/03/2016   SJM Fortify Assura Dr generator change by Dr Rayann Heman.  ICD implanted for secondary prevention of sudden death  . IMPLANTABLE CARDIOVERTER DEFIBRILLATOR IMPLANT  08/18/2013   St. Jude Fortify Assura DR ICD implanted by Dr Rayann Heman for secondary treatment of VT  . IMPLANTABLE  CARDIOVERTER DEFIBRILLATOR IMPLANT N/A 08/18/2013   Procedure: IMPLANTABLE CARDIOVERTER DEFIBRILLATOR IMPLANT;  Surgeon: Coralyn Mark, MD;  Location: [facility];  Service: Cardiovascular;  Laterality: N/A;    ROS- all systems are reviewed and negative except as per HPI above  Current Outpatient Medications  Medication Sig Dispense Refill  . calcium carbonate (OSCAL) 1500 (600 Ca) MG TABS tablet Take 600 mg by mouth daily.    . cholecalciferol (VITAMIN D) 25 MCG (1000 UNIT) tablet Take 1,000 Units by mouth in the morning and at bedtime.    . Coenzyme Q10 (CO Q 10) 100 MG CAPS Take 100 mg by mouth daily.    Marland Kitchen ELIQUIS 5 MG TABS tablet TAKE 1 TABLET BY MOUTH TWICE DAILY 60 tablet 6  . Glucosamine-Chondroitin (COSAMIN DS PO) Take 1 tablet by mouth daily.    . metoprolol succinate (TOPROL-XL) 25 MG 24 hr tablet Take 0.5 tablets (12.5 mg total) by mouth in the morning and at bedtime. 135 tablet 3  . Omeprazole Magnesium (PRILOSEC OTC PO) Take 10 mg by mouth daily.     . Probiotic Product (ALIGN PO) Take 1 capsule by mouth daily.    . sotalol (BETAPACE) 120 MG tablet TAKE 1 TABLET(120 MG) BY MOUTH TWICE DAILY 180 tablet 1  . Zinc 50 MG CAPS Take 50 mg by mouth 2 (two) times daily.      No current facility-administered medications for this visit.    Physical Exam: Vitals:   01/15/21 1425  BP: 108/60  Pulse: (!) 59  SpO2: 97%  Weight: 173 lb 12.8 oz (78.8 kg)  Height: 5\' 10"  (1.778 m)    GEN- The patient is well appearing, alert and oriented x 3 today.   Head- normocephalic, atraumatic Eyes-  Sclera clear, conjunctiva pink Ears- hearing intact Oropharynx- clear Lungs-   normal work of breathing Chest- ICD pocket is well healed Heart- Regular rate and rhythm  GI- soft  Extremities- no clubbing, cyanosis, or edema  ICD interrogation- reviewed in detail today,  See PACEART report  ekg tracing ordered today is personally reviewed and shows sinus with first degree AV block (240 msec),  Qtc 425 msec, nonspecific ST/T changes  Wt Readings from Last 3 Encounters:  01/15/21 173 lb 12.8 oz (78.8 kg)  10/16/20 170 lb 6.4 oz (77.3 kg)  08/02/20 171 lb 12.8 oz (77.9 kg)    Assessment and Plan:  1. afib Well controlled Burden is 1 % by ICD but appears to be false detection.  I have adjusted atrial sensitivity today to prevent this chads2vasc score is 3.  He is on eliquis  2. HCM with h/o VT Stable on an appropriate medical regimen Normal ICD function See Pace Art report Atrial sensitivity is reduced to 1 today due to false AF episodes he is not device dependant today VT well controlled with sotalol.  We will need to follow him closely on this medicine to avoid toxicity  Return in 6 months to AF clinic I will see in a year  [name], [facility] 01/15/2021 2:44 PM

## 2021-01-20 DIAGNOSIS — M9902 Segmental and somatic dysfunction of thoracic region: Secondary | ICD-10-CM | POA: Diagnosis not present

## 2021-01-20 DIAGNOSIS — M9901 Segmental and somatic dysfunction of cervical region: Secondary | ICD-10-CM | POA: Diagnosis not present

## 2021-01-20 DIAGNOSIS — R293 Abnormal posture: Secondary | ICD-10-CM | POA: Diagnosis not present

## 2021-01-20 DIAGNOSIS — M5459 Other low back pain: Secondary | ICD-10-CM | POA: Diagnosis not present

## 2021-01-20 DIAGNOSIS — M50222 Other cervical disc displacement at C5-C6 level: Secondary | ICD-10-CM | POA: Diagnosis not present

## 2021-01-20 DIAGNOSIS — M791 Myalgia, unspecified site: Secondary | ICD-10-CM | POA: Diagnosis not present

## 2021-01-20 DIAGNOSIS — M542 Cervicalgia: Secondary | ICD-10-CM | POA: Diagnosis not present

## 2021-01-20 DIAGNOSIS — M5137 Other intervertebral disc degeneration, lumbosacral region: Secondary | ICD-10-CM | POA: Diagnosis not present

## 2021-01-20 DIAGNOSIS — M9903 Segmental and somatic dysfunction of lumbar region: Secondary | ICD-10-CM | POA: Diagnosis not present

## 2021-01-27 DIAGNOSIS — M9901 Segmental and somatic dysfunction of cervical region: Secondary | ICD-10-CM | POA: Diagnosis not present

## 2021-01-27 DIAGNOSIS — M5459 Other low back pain: Secondary | ICD-10-CM | POA: Diagnosis not present

## 2021-01-27 DIAGNOSIS — M542 Cervicalgia: Secondary | ICD-10-CM | POA: Diagnosis not present

## 2021-01-27 DIAGNOSIS — M9903 Segmental and somatic dysfunction of lumbar region: Secondary | ICD-10-CM | POA: Diagnosis not present

## 2021-01-27 DIAGNOSIS — M5137 Other intervertebral disc degeneration, lumbosacral region: Secondary | ICD-10-CM | POA: Diagnosis not present

## 2021-01-27 DIAGNOSIS — R293 Abnormal posture: Secondary | ICD-10-CM | POA: Diagnosis not present

## 2021-01-27 DIAGNOSIS — M791 Myalgia, unspecified site: Secondary | ICD-10-CM | POA: Diagnosis not present

## 2021-01-27 DIAGNOSIS — M9902 Segmental and somatic dysfunction of thoracic region: Secondary | ICD-10-CM | POA: Diagnosis not present

## 2021-01-27 DIAGNOSIS — M50222 Other cervical disc displacement at C5-C6 level: Secondary | ICD-10-CM | POA: Diagnosis not present

## 2021-02-03 DIAGNOSIS — M50222 Other cervical disc displacement at C5-C6 level: Secondary | ICD-10-CM | POA: Diagnosis not present

## 2021-02-03 DIAGNOSIS — R293 Abnormal posture: Secondary | ICD-10-CM | POA: Diagnosis not present

## 2021-02-03 DIAGNOSIS — M791 Myalgia, unspecified site: Secondary | ICD-10-CM | POA: Diagnosis not present

## 2021-02-03 DIAGNOSIS — M9901 Segmental and somatic dysfunction of cervical region: Secondary | ICD-10-CM | POA: Diagnosis not present

## 2021-02-03 DIAGNOSIS — M5459 Other low back pain: Secondary | ICD-10-CM | POA: Diagnosis not present

## 2021-02-03 DIAGNOSIS — M542 Cervicalgia: Secondary | ICD-10-CM | POA: Diagnosis not present

## 2021-02-03 DIAGNOSIS — M9902 Segmental and somatic dysfunction of thoracic region: Secondary | ICD-10-CM | POA: Diagnosis not present

## 2021-02-03 DIAGNOSIS — M9903 Segmental and somatic dysfunction of lumbar region: Secondary | ICD-10-CM | POA: Diagnosis not present

## 2021-02-03 DIAGNOSIS — M5137 Other intervertebral disc degeneration, lumbosacral region: Secondary | ICD-10-CM | POA: Diagnosis not present

## 2021-02-12 DIAGNOSIS — M542 Cervicalgia: Secondary | ICD-10-CM | POA: Diagnosis not present

## 2021-02-12 DIAGNOSIS — M5459 Other low back pain: Secondary | ICD-10-CM | POA: Diagnosis not present

## 2021-02-12 DIAGNOSIS — M791 Myalgia, unspecified site: Secondary | ICD-10-CM | POA: Diagnosis not present

## 2021-02-12 DIAGNOSIS — M9903 Segmental and somatic dysfunction of lumbar region: Secondary | ICD-10-CM | POA: Diagnosis not present

## 2021-02-12 DIAGNOSIS — M9901 Segmental and somatic dysfunction of cervical region: Secondary | ICD-10-CM | POA: Diagnosis not present

## 2021-02-12 DIAGNOSIS — M50222 Other cervical disc displacement at C5-C6 level: Secondary | ICD-10-CM | POA: Diagnosis not present

## 2021-02-12 DIAGNOSIS — M5137 Other intervertebral disc degeneration, lumbosacral region: Secondary | ICD-10-CM | POA: Diagnosis not present

## 2021-02-12 DIAGNOSIS — R293 Abnormal posture: Secondary | ICD-10-CM | POA: Diagnosis not present

## 2021-02-12 DIAGNOSIS — M9902 Segmental and somatic dysfunction of thoracic region: Secondary | ICD-10-CM | POA: Diagnosis not present

## 2021-02-17 DIAGNOSIS — M9903 Segmental and somatic dysfunction of lumbar region: Secondary | ICD-10-CM | POA: Diagnosis not present

## 2021-02-17 DIAGNOSIS — M5137 Other intervertebral disc degeneration, lumbosacral region: Secondary | ICD-10-CM | POA: Diagnosis not present

## 2021-02-17 DIAGNOSIS — R293 Abnormal posture: Secondary | ICD-10-CM | POA: Diagnosis not present

## 2021-02-17 DIAGNOSIS — M791 Myalgia, unspecified site: Secondary | ICD-10-CM | POA: Diagnosis not present

## 2021-02-17 DIAGNOSIS — M50222 Other cervical disc displacement at C5-C6 level: Secondary | ICD-10-CM | POA: Diagnosis not present

## 2021-02-17 DIAGNOSIS — M542 Cervicalgia: Secondary | ICD-10-CM | POA: Diagnosis not present

## 2021-02-17 DIAGNOSIS — M5459 Other low back pain: Secondary | ICD-10-CM | POA: Diagnosis not present

## 2021-02-17 DIAGNOSIS — M9902 Segmental and somatic dysfunction of thoracic region: Secondary | ICD-10-CM | POA: Diagnosis not present

## 2021-02-17 DIAGNOSIS — M9901 Segmental and somatic dysfunction of cervical region: Secondary | ICD-10-CM | POA: Diagnosis not present

## 2021-02-24 DIAGNOSIS — M9902 Segmental and somatic dysfunction of thoracic region: Secondary | ICD-10-CM | POA: Diagnosis not present

## 2021-02-24 DIAGNOSIS — M5137 Other intervertebral disc degeneration, lumbosacral region: Secondary | ICD-10-CM | POA: Diagnosis not present

## 2021-02-24 DIAGNOSIS — M791 Myalgia, unspecified site: Secondary | ICD-10-CM | POA: Diagnosis not present

## 2021-02-24 DIAGNOSIS — R293 Abnormal posture: Secondary | ICD-10-CM | POA: Diagnosis not present

## 2021-02-24 DIAGNOSIS — M5459 Other low back pain: Secondary | ICD-10-CM | POA: Diagnosis not present

## 2021-02-24 DIAGNOSIS — M50222 Other cervical disc displacement at C5-C6 level: Secondary | ICD-10-CM | POA: Diagnosis not present

## 2021-02-24 DIAGNOSIS — M9901 Segmental and somatic dysfunction of cervical region: Secondary | ICD-10-CM | POA: Diagnosis not present

## 2021-02-24 DIAGNOSIS — M9903 Segmental and somatic dysfunction of lumbar region: Secondary | ICD-10-CM | POA: Diagnosis not present

## 2021-02-24 DIAGNOSIS — M542 Cervicalgia: Secondary | ICD-10-CM | POA: Diagnosis not present

## 2021-03-03 DIAGNOSIS — M5459 Other low back pain: Secondary | ICD-10-CM | POA: Diagnosis not present

## 2021-03-03 DIAGNOSIS — M50222 Other cervical disc displacement at C5-C6 level: Secondary | ICD-10-CM | POA: Diagnosis not present

## 2021-03-03 DIAGNOSIS — M5137 Other intervertebral disc degeneration, lumbosacral region: Secondary | ICD-10-CM | POA: Diagnosis not present

## 2021-03-03 DIAGNOSIS — R293 Abnormal posture: Secondary | ICD-10-CM | POA: Diagnosis not present

## 2021-03-03 DIAGNOSIS — M9903 Segmental and somatic dysfunction of lumbar region: Secondary | ICD-10-CM | POA: Diagnosis not present

## 2021-03-03 DIAGNOSIS — M542 Cervicalgia: Secondary | ICD-10-CM | POA: Diagnosis not present

## 2021-03-03 DIAGNOSIS — M791 Myalgia, unspecified site: Secondary | ICD-10-CM | POA: Diagnosis not present

## 2021-03-03 DIAGNOSIS — M9902 Segmental and somatic dysfunction of thoracic region: Secondary | ICD-10-CM | POA: Diagnosis not present

## 2021-03-03 DIAGNOSIS — M9901 Segmental and somatic dysfunction of cervical region: Secondary | ICD-10-CM | POA: Diagnosis not present

## 2021-03-06 ENCOUNTER — Ambulatory Visit (INDEPENDENT_AMBULATORY_CARE_PROVIDER_SITE_OTHER): Payer: Medicare Other

## 2021-03-06 DIAGNOSIS — I422 Other hypertrophic cardiomyopathy: Secondary | ICD-10-CM

## 2021-03-06 LAB — CUP PACEART REMOTE DEVICE CHECK
Battery Remaining Longevity: 56 mo
Battery Remaining Percentage: 55 %
Battery Voltage: 2.96 V
Brady Statistic AP VP Percent: 1 %
Brady Statistic AP VS Percent: 31 %
Brady Statistic AS VP Percent: 1 %
Brady Statistic AS VS Percent: 67 %
Brady Statistic RA Percent Paced: 30 %
Brady Statistic RV Percent Paced: 1.4 %
Date Time Interrogation Session: 20220623020017
HighPow Impedance: 52 Ohm
HighPow Impedance: 53 Ohm
Implantable Lead Implant Date: 20141205
Implantable Lead Implant Date: 20141205
Implantable Lead Location: 753859
Implantable Lead Location: 753860
Implantable Lead Model: 7121
Implantable Pulse Generator Implant Date: 20171221
Lead Channel Impedance Value: 450 Ohm
Lead Channel Impedance Value: 540 Ohm
Lead Channel Pacing Threshold Amplitude: 0.5 V
Lead Channel Pacing Threshold Amplitude: 1 V
Lead Channel Pacing Threshold Pulse Width: 0.5 ms
Lead Channel Pacing Threshold Pulse Width: 0.5 ms
Lead Channel Sensing Intrinsic Amplitude: 12 mV
Lead Channel Sensing Intrinsic Amplitude: 5 mV
Lead Channel Setting Pacing Amplitude: 2 V
Lead Channel Setting Pacing Amplitude: 2.5 V
Lead Channel Setting Pacing Pulse Width: 0.5 ms
Lead Channel Setting Sensing Sensitivity: 0.5 mV
Pulse Gen Serial Number: 7387049

## 2021-03-10 DIAGNOSIS — R293 Abnormal posture: Secondary | ICD-10-CM | POA: Diagnosis not present

## 2021-03-10 DIAGNOSIS — M9901 Segmental and somatic dysfunction of cervical region: Secondary | ICD-10-CM | POA: Diagnosis not present

## 2021-03-10 DIAGNOSIS — M791 Myalgia, unspecified site: Secondary | ICD-10-CM | POA: Diagnosis not present

## 2021-03-10 DIAGNOSIS — M5137 Other intervertebral disc degeneration, lumbosacral region: Secondary | ICD-10-CM | POA: Diagnosis not present

## 2021-03-10 DIAGNOSIS — M542 Cervicalgia: Secondary | ICD-10-CM | POA: Diagnosis not present

## 2021-03-10 DIAGNOSIS — M9903 Segmental and somatic dysfunction of lumbar region: Secondary | ICD-10-CM | POA: Diagnosis not present

## 2021-03-10 DIAGNOSIS — M50222 Other cervical disc displacement at C5-C6 level: Secondary | ICD-10-CM | POA: Diagnosis not present

## 2021-03-10 DIAGNOSIS — M9902 Segmental and somatic dysfunction of thoracic region: Secondary | ICD-10-CM | POA: Diagnosis not present

## 2021-03-10 DIAGNOSIS — M5459 Other low back pain: Secondary | ICD-10-CM | POA: Diagnosis not present

## 2021-03-19 DIAGNOSIS — M5459 Other low back pain: Secondary | ICD-10-CM | POA: Diagnosis not present

## 2021-03-19 DIAGNOSIS — M5137 Other intervertebral disc degeneration, lumbosacral region: Secondary | ICD-10-CM | POA: Diagnosis not present

## 2021-03-19 DIAGNOSIS — M542 Cervicalgia: Secondary | ICD-10-CM | POA: Diagnosis not present

## 2021-03-19 DIAGNOSIS — R293 Abnormal posture: Secondary | ICD-10-CM | POA: Diagnosis not present

## 2021-03-19 DIAGNOSIS — M9902 Segmental and somatic dysfunction of thoracic region: Secondary | ICD-10-CM | POA: Diagnosis not present

## 2021-03-19 DIAGNOSIS — M791 Myalgia, unspecified site: Secondary | ICD-10-CM | POA: Diagnosis not present

## 2021-03-19 DIAGNOSIS — M50222 Other cervical disc displacement at C5-C6 level: Secondary | ICD-10-CM | POA: Diagnosis not present

## 2021-03-19 DIAGNOSIS — M9903 Segmental and somatic dysfunction of lumbar region: Secondary | ICD-10-CM | POA: Diagnosis not present

## 2021-03-19 DIAGNOSIS — M9901 Segmental and somatic dysfunction of cervical region: Secondary | ICD-10-CM | POA: Diagnosis not present

## 2021-03-24 DIAGNOSIS — M9903 Segmental and somatic dysfunction of lumbar region: Secondary | ICD-10-CM | POA: Diagnosis not present

## 2021-03-24 DIAGNOSIS — R293 Abnormal posture: Secondary | ICD-10-CM | POA: Diagnosis not present

## 2021-03-24 DIAGNOSIS — M9901 Segmental and somatic dysfunction of cervical region: Secondary | ICD-10-CM | POA: Diagnosis not present

## 2021-03-24 DIAGNOSIS — M5459 Other low back pain: Secondary | ICD-10-CM | POA: Diagnosis not present

## 2021-03-24 DIAGNOSIS — M542 Cervicalgia: Secondary | ICD-10-CM | POA: Diagnosis not present

## 2021-03-24 DIAGNOSIS — M9902 Segmental and somatic dysfunction of thoracic region: Secondary | ICD-10-CM | POA: Diagnosis not present

## 2021-03-24 DIAGNOSIS — M50222 Other cervical disc displacement at C5-C6 level: Secondary | ICD-10-CM | POA: Diagnosis not present

## 2021-03-24 DIAGNOSIS — M5137 Other intervertebral disc degeneration, lumbosacral region: Secondary | ICD-10-CM | POA: Diagnosis not present

## 2021-03-24 DIAGNOSIS — M791 Myalgia, unspecified site: Secondary | ICD-10-CM | POA: Diagnosis not present

## 2021-03-25 NOTE — Progress Notes (Signed)
Remote ICD transmission.   

## 2021-03-31 DIAGNOSIS — M791 Myalgia, unspecified site: Secondary | ICD-10-CM | POA: Diagnosis not present

## 2021-03-31 DIAGNOSIS — M5137 Other intervertebral disc degeneration, lumbosacral region: Secondary | ICD-10-CM | POA: Diagnosis not present

## 2021-03-31 DIAGNOSIS — M50222 Other cervical disc displacement at C5-C6 level: Secondary | ICD-10-CM | POA: Diagnosis not present

## 2021-03-31 DIAGNOSIS — M9901 Segmental and somatic dysfunction of cervical region: Secondary | ICD-10-CM | POA: Diagnosis not present

## 2021-03-31 DIAGNOSIS — M9902 Segmental and somatic dysfunction of thoracic region: Secondary | ICD-10-CM | POA: Diagnosis not present

## 2021-03-31 DIAGNOSIS — R293 Abnormal posture: Secondary | ICD-10-CM | POA: Diagnosis not present

## 2021-03-31 DIAGNOSIS — M9903 Segmental and somatic dysfunction of lumbar region: Secondary | ICD-10-CM | POA: Diagnosis not present

## 2021-03-31 DIAGNOSIS — M5459 Other low back pain: Secondary | ICD-10-CM | POA: Diagnosis not present

## 2021-03-31 DIAGNOSIS — M542 Cervicalgia: Secondary | ICD-10-CM | POA: Diagnosis not present

## 2021-04-07 DIAGNOSIS — M5137 Other intervertebral disc degeneration, lumbosacral region: Secondary | ICD-10-CM | POA: Diagnosis not present

## 2021-04-07 DIAGNOSIS — M791 Myalgia, unspecified site: Secondary | ICD-10-CM | POA: Diagnosis not present

## 2021-04-07 DIAGNOSIS — M50222 Other cervical disc displacement at C5-C6 level: Secondary | ICD-10-CM | POA: Diagnosis not present

## 2021-04-07 DIAGNOSIS — M9903 Segmental and somatic dysfunction of lumbar region: Secondary | ICD-10-CM | POA: Diagnosis not present

## 2021-04-07 DIAGNOSIS — M5459 Other low back pain: Secondary | ICD-10-CM | POA: Diagnosis not present

## 2021-04-07 DIAGNOSIS — M9901 Segmental and somatic dysfunction of cervical region: Secondary | ICD-10-CM | POA: Diagnosis not present

## 2021-04-07 DIAGNOSIS — R293 Abnormal posture: Secondary | ICD-10-CM | POA: Diagnosis not present

## 2021-04-07 DIAGNOSIS — M542 Cervicalgia: Secondary | ICD-10-CM | POA: Diagnosis not present

## 2021-04-07 DIAGNOSIS — M9902 Segmental and somatic dysfunction of thoracic region: Secondary | ICD-10-CM | POA: Diagnosis not present

## 2021-04-14 DIAGNOSIS — M9902 Segmental and somatic dysfunction of thoracic region: Secondary | ICD-10-CM | POA: Diagnosis not present

## 2021-04-14 DIAGNOSIS — M9903 Segmental and somatic dysfunction of lumbar region: Secondary | ICD-10-CM | POA: Diagnosis not present

## 2021-04-14 DIAGNOSIS — M542 Cervicalgia: Secondary | ICD-10-CM | POA: Diagnosis not present

## 2021-04-14 DIAGNOSIS — R293 Abnormal posture: Secondary | ICD-10-CM | POA: Diagnosis not present

## 2021-04-14 DIAGNOSIS — M5459 Other low back pain: Secondary | ICD-10-CM | POA: Diagnosis not present

## 2021-04-14 DIAGNOSIS — M791 Myalgia, unspecified site: Secondary | ICD-10-CM | POA: Diagnosis not present

## 2021-04-14 DIAGNOSIS — M50222 Other cervical disc displacement at C5-C6 level: Secondary | ICD-10-CM | POA: Diagnosis not present

## 2021-04-14 DIAGNOSIS — H6123 Impacted cerumen, bilateral: Secondary | ICD-10-CM | POA: Diagnosis not present

## 2021-04-14 DIAGNOSIS — M9901 Segmental and somatic dysfunction of cervical region: Secondary | ICD-10-CM | POA: Diagnosis not present

## 2021-04-14 DIAGNOSIS — M5137 Other intervertebral disc degeneration, lumbosacral region: Secondary | ICD-10-CM | POA: Diagnosis not present

## 2021-04-21 DIAGNOSIS — M791 Myalgia, unspecified site: Secondary | ICD-10-CM | POA: Diagnosis not present

## 2021-04-21 DIAGNOSIS — M9901 Segmental and somatic dysfunction of cervical region: Secondary | ICD-10-CM | POA: Diagnosis not present

## 2021-04-21 DIAGNOSIS — H6121 Impacted cerumen, right ear: Secondary | ICD-10-CM | POA: Diagnosis not present

## 2021-04-21 DIAGNOSIS — M5137 Other intervertebral disc degeneration, lumbosacral region: Secondary | ICD-10-CM | POA: Diagnosis not present

## 2021-04-21 DIAGNOSIS — M542 Cervicalgia: Secondary | ICD-10-CM | POA: Diagnosis not present

## 2021-04-21 DIAGNOSIS — R293 Abnormal posture: Secondary | ICD-10-CM | POA: Diagnosis not present

## 2021-04-21 DIAGNOSIS — M50222 Other cervical disc displacement at C5-C6 level: Secondary | ICD-10-CM | POA: Diagnosis not present

## 2021-04-21 DIAGNOSIS — M9902 Segmental and somatic dysfunction of thoracic region: Secondary | ICD-10-CM | POA: Diagnosis not present

## 2021-04-21 DIAGNOSIS — M9903 Segmental and somatic dysfunction of lumbar region: Secondary | ICD-10-CM | POA: Diagnosis not present

## 2021-04-21 DIAGNOSIS — M5459 Other low back pain: Secondary | ICD-10-CM | POA: Diagnosis not present

## 2021-04-28 DIAGNOSIS — M50222 Other cervical disc displacement at C5-C6 level: Secondary | ICD-10-CM | POA: Diagnosis not present

## 2021-04-28 DIAGNOSIS — M9902 Segmental and somatic dysfunction of thoracic region: Secondary | ICD-10-CM | POA: Diagnosis not present

## 2021-04-28 DIAGNOSIS — M9903 Segmental and somatic dysfunction of lumbar region: Secondary | ICD-10-CM | POA: Diagnosis not present

## 2021-04-28 DIAGNOSIS — M5137 Other intervertebral disc degeneration, lumbosacral region: Secondary | ICD-10-CM | POA: Diagnosis not present

## 2021-04-28 DIAGNOSIS — M9901 Segmental and somatic dysfunction of cervical region: Secondary | ICD-10-CM | POA: Diagnosis not present

## 2021-04-28 DIAGNOSIS — M5459 Other low back pain: Secondary | ICD-10-CM | POA: Diagnosis not present

## 2021-04-28 DIAGNOSIS — M542 Cervicalgia: Secondary | ICD-10-CM | POA: Diagnosis not present

## 2021-04-28 DIAGNOSIS — R293 Abnormal posture: Secondary | ICD-10-CM | POA: Diagnosis not present

## 2021-04-28 DIAGNOSIS — M791 Myalgia, unspecified site: Secondary | ICD-10-CM | POA: Diagnosis not present

## 2021-05-05 DIAGNOSIS — M791 Myalgia, unspecified site: Secondary | ICD-10-CM | POA: Diagnosis not present

## 2021-05-05 DIAGNOSIS — M50222 Other cervical disc displacement at C5-C6 level: Secondary | ICD-10-CM | POA: Diagnosis not present

## 2021-05-05 DIAGNOSIS — M542 Cervicalgia: Secondary | ICD-10-CM | POA: Diagnosis not present

## 2021-05-05 DIAGNOSIS — M9903 Segmental and somatic dysfunction of lumbar region: Secondary | ICD-10-CM | POA: Diagnosis not present

## 2021-05-05 DIAGNOSIS — M5137 Other intervertebral disc degeneration, lumbosacral region: Secondary | ICD-10-CM | POA: Diagnosis not present

## 2021-05-05 DIAGNOSIS — M5459 Other low back pain: Secondary | ICD-10-CM | POA: Diagnosis not present

## 2021-05-05 DIAGNOSIS — M9902 Segmental and somatic dysfunction of thoracic region: Secondary | ICD-10-CM | POA: Diagnosis not present

## 2021-05-05 DIAGNOSIS — M9901 Segmental and somatic dysfunction of cervical region: Secondary | ICD-10-CM | POA: Diagnosis not present

## 2021-05-05 DIAGNOSIS — R293 Abnormal posture: Secondary | ICD-10-CM | POA: Diagnosis not present

## 2021-05-12 DIAGNOSIS — M9903 Segmental and somatic dysfunction of lumbar region: Secondary | ICD-10-CM | POA: Diagnosis not present

## 2021-05-12 DIAGNOSIS — M5459 Other low back pain: Secondary | ICD-10-CM | POA: Diagnosis not present

## 2021-05-12 DIAGNOSIS — M50222 Other cervical disc displacement at C5-C6 level: Secondary | ICD-10-CM | POA: Diagnosis not present

## 2021-05-12 DIAGNOSIS — R293 Abnormal posture: Secondary | ICD-10-CM | POA: Diagnosis not present

## 2021-05-12 DIAGNOSIS — M9902 Segmental and somatic dysfunction of thoracic region: Secondary | ICD-10-CM | POA: Diagnosis not present

## 2021-05-12 DIAGNOSIS — M791 Myalgia, unspecified site: Secondary | ICD-10-CM | POA: Diagnosis not present

## 2021-05-12 DIAGNOSIS — M542 Cervicalgia: Secondary | ICD-10-CM | POA: Diagnosis not present

## 2021-05-12 DIAGNOSIS — M5137 Other intervertebral disc degeneration, lumbosacral region: Secondary | ICD-10-CM | POA: Diagnosis not present

## 2021-05-12 DIAGNOSIS — M9901 Segmental and somatic dysfunction of cervical region: Secondary | ICD-10-CM | POA: Diagnosis not present

## 2021-05-14 DIAGNOSIS — E781 Pure hyperglyceridemia: Secondary | ICD-10-CM | POA: Diagnosis not present

## 2021-05-14 DIAGNOSIS — N1831 Chronic kidney disease, stage 3a: Secondary | ICD-10-CM | POA: Diagnosis not present

## 2021-05-14 DIAGNOSIS — I480 Paroxysmal atrial fibrillation: Secondary | ICD-10-CM | POA: Diagnosis not present

## 2021-05-26 DIAGNOSIS — M542 Cervicalgia: Secondary | ICD-10-CM | POA: Diagnosis not present

## 2021-05-26 DIAGNOSIS — M791 Myalgia, unspecified site: Secondary | ICD-10-CM | POA: Diagnosis not present

## 2021-05-26 DIAGNOSIS — M9902 Segmental and somatic dysfunction of thoracic region: Secondary | ICD-10-CM | POA: Diagnosis not present

## 2021-05-26 DIAGNOSIS — R293 Abnormal posture: Secondary | ICD-10-CM | POA: Diagnosis not present

## 2021-05-26 DIAGNOSIS — M9901 Segmental and somatic dysfunction of cervical region: Secondary | ICD-10-CM | POA: Diagnosis not present

## 2021-05-26 DIAGNOSIS — M50222 Other cervical disc displacement at C5-C6 level: Secondary | ICD-10-CM | POA: Diagnosis not present

## 2021-05-26 DIAGNOSIS — M5459 Other low back pain: Secondary | ICD-10-CM | POA: Diagnosis not present

## 2021-05-26 DIAGNOSIS — M9903 Segmental and somatic dysfunction of lumbar region: Secondary | ICD-10-CM | POA: Diagnosis not present

## 2021-05-26 DIAGNOSIS — M5137 Other intervertebral disc degeneration, lumbosacral region: Secondary | ICD-10-CM | POA: Diagnosis not present

## 2021-06-04 DIAGNOSIS — L565 Disseminated superficial actinic porokeratosis (DSAP): Secondary | ICD-10-CM | POA: Diagnosis not present

## 2021-06-04 DIAGNOSIS — L821 Other seborrheic keratosis: Secondary | ICD-10-CM | POA: Diagnosis not present

## 2021-06-04 DIAGNOSIS — D1801 Hemangioma of skin and subcutaneous tissue: Secondary | ICD-10-CM | POA: Diagnosis not present

## 2021-06-04 DIAGNOSIS — L812 Freckles: Secondary | ICD-10-CM | POA: Diagnosis not present

## 2021-06-05 ENCOUNTER — Ambulatory Visit (INDEPENDENT_AMBULATORY_CARE_PROVIDER_SITE_OTHER): Payer: Medicare Other

## 2021-06-05 DIAGNOSIS — I422 Other hypertrophic cardiomyopathy: Secondary | ICD-10-CM

## 2021-06-06 LAB — CUP PACEART REMOTE DEVICE CHECK
Battery Remaining Longevity: 53 mo
Battery Remaining Percentage: 52 %
Battery Voltage: 2.96 V
Brady Statistic AP VP Percent: 1.3 %
Brady Statistic AP VS Percent: 31 %
Brady Statistic AS VP Percent: 1 %
Brady Statistic AS VS Percent: 66 %
Brady Statistic RA Percent Paced: 31 %
Brady Statistic RV Percent Paced: 1.8 %
Date Time Interrogation Session: 20220922020017
HighPow Impedance: 55 Ohm
HighPow Impedance: 55 Ohm
Implantable Lead Implant Date: 20141205
Implantable Lead Implant Date: 20141205
Implantable Lead Location: 753859
Implantable Lead Location: 753860
Implantable Lead Model: 7121
Implantable Pulse Generator Implant Date: 20171221
Lead Channel Impedance Value: 450 Ohm
Lead Channel Impedance Value: 530 Ohm
Lead Channel Pacing Threshold Amplitude: 0.5 V
Lead Channel Pacing Threshold Amplitude: 1 V
Lead Channel Pacing Threshold Pulse Width: 0.5 ms
Lead Channel Pacing Threshold Pulse Width: 0.5 ms
Lead Channel Sensing Intrinsic Amplitude: 12 mV
Lead Channel Sensing Intrinsic Amplitude: 5 mV
Lead Channel Setting Pacing Amplitude: 2 V
Lead Channel Setting Pacing Amplitude: 2.5 V
Lead Channel Setting Pacing Pulse Width: 0.5 ms
Lead Channel Setting Sensing Sensitivity: 0.5 mV
Pulse Gen Serial Number: 7387049

## 2021-06-12 NOTE — Progress Notes (Signed)
Remote ICD transmission.   

## 2021-06-16 DIAGNOSIS — R293 Abnormal posture: Secondary | ICD-10-CM | POA: Diagnosis not present

## 2021-06-16 DIAGNOSIS — M5137 Other intervertebral disc degeneration, lumbosacral region: Secondary | ICD-10-CM | POA: Diagnosis not present

## 2021-06-16 DIAGNOSIS — M5459 Other low back pain: Secondary | ICD-10-CM | POA: Diagnosis not present

## 2021-06-16 DIAGNOSIS — M542 Cervicalgia: Secondary | ICD-10-CM | POA: Diagnosis not present

## 2021-06-16 DIAGNOSIS — M9902 Segmental and somatic dysfunction of thoracic region: Secondary | ICD-10-CM | POA: Diagnosis not present

## 2021-06-16 DIAGNOSIS — M50222 Other cervical disc displacement at C5-C6 level: Secondary | ICD-10-CM | POA: Diagnosis not present

## 2021-06-16 DIAGNOSIS — M791 Myalgia, unspecified site: Secondary | ICD-10-CM | POA: Diagnosis not present

## 2021-06-16 DIAGNOSIS — M9901 Segmental and somatic dysfunction of cervical region: Secondary | ICD-10-CM | POA: Diagnosis not present

## 2021-06-16 DIAGNOSIS — M9903 Segmental and somatic dysfunction of lumbar region: Secondary | ICD-10-CM | POA: Diagnosis not present

## 2021-06-23 ENCOUNTER — Other Ambulatory Visit (HOSPITAL_COMMUNITY): Payer: Self-pay | Admitting: *Deleted

## 2021-06-23 DIAGNOSIS — R293 Abnormal posture: Secondary | ICD-10-CM | POA: Diagnosis not present

## 2021-06-23 DIAGNOSIS — M9901 Segmental and somatic dysfunction of cervical region: Secondary | ICD-10-CM | POA: Diagnosis not present

## 2021-06-23 DIAGNOSIS — M5137 Other intervertebral disc degeneration, lumbosacral region: Secondary | ICD-10-CM | POA: Diagnosis not present

## 2021-06-23 DIAGNOSIS — M791 Myalgia, unspecified site: Secondary | ICD-10-CM | POA: Diagnosis not present

## 2021-06-23 DIAGNOSIS — M9903 Segmental and somatic dysfunction of lumbar region: Secondary | ICD-10-CM | POA: Diagnosis not present

## 2021-06-23 DIAGNOSIS — M50222 Other cervical disc displacement at C5-C6 level: Secondary | ICD-10-CM | POA: Diagnosis not present

## 2021-06-23 DIAGNOSIS — M5459 Other low back pain: Secondary | ICD-10-CM | POA: Diagnosis not present

## 2021-06-23 DIAGNOSIS — M9902 Segmental and somatic dysfunction of thoracic region: Secondary | ICD-10-CM | POA: Diagnosis not present

## 2021-06-23 DIAGNOSIS — M542 Cervicalgia: Secondary | ICD-10-CM | POA: Diagnosis not present

## 2021-06-23 MED ORDER — SOTALOL HCL 120 MG PO TABS
ORAL_TABLET | ORAL | 1 refills | Status: DC
Start: 2021-06-23 — End: 2022-03-24

## 2021-07-07 ENCOUNTER — Encounter (HOSPITAL_COMMUNITY): Payer: Self-pay

## 2021-07-07 ENCOUNTER — Telehealth: Payer: Self-pay | Admitting: Internal Medicine

## 2021-07-07 NOTE — Telephone Encounter (Signed)
Patient calling in wanting to speak with the the nurse about the discomfort he is feeling. Please advise

## 2021-07-07 NOTE — Telephone Encounter (Signed)
Patient has had an array of symptoms since Friday including feeling as though the room is spinning resulting in vomiting and profuse sweating Friday, Saturday he felt anxious and some room spinning but this has subsided. Yesterday he felt well and back to baseline. This morning he felt again anxious and severe [place] no dizziness. Pt BP is 118/58 HR 55 pt was concerned his DBP could be causing his symptoms or something cardiac related. Discussed with Adline Peals PA did not feel symptoms cardiac related - no chest pressure/pain, shortness of breath, headache, etc noted by pt. Pt instructed to contact PCP for further assessment. Pt states he has a call into his PCP as well and will follow up with them. Pt appreciative of advise.

## 2021-07-07 NOTE — Telephone Encounter (Signed)
Addressed in separate telephone encounter.

## 2021-07-08 DIAGNOSIS — R7301 Impaired fasting glucose: Secondary | ICD-10-CM | POA: Diagnosis not present

## 2021-07-08 DIAGNOSIS — H8110 Benign paroxysmal vertigo, unspecified ear: Secondary | ICD-10-CM | POA: Diagnosis not present

## 2021-07-08 DIAGNOSIS — I421 Obstructive hypertrophic cardiomyopathy: Secondary | ICD-10-CM | POA: Diagnosis not present

## 2021-07-08 DIAGNOSIS — Z8679 Personal history of other diseases of the circulatory system: Secondary | ICD-10-CM | POA: Diagnosis not present

## 2021-07-08 DIAGNOSIS — I480 Paroxysmal atrial fibrillation: Secondary | ICD-10-CM | POA: Diagnosis not present

## 2021-07-08 DIAGNOSIS — N1831 Chronic kidney disease, stage 3a: Secondary | ICD-10-CM | POA: Diagnosis not present

## 2021-07-08 DIAGNOSIS — Z7901 Long term (current) use of anticoagulants: Secondary | ICD-10-CM | POA: Diagnosis not present

## 2021-07-08 DIAGNOSIS — Z95810 Presence of automatic (implantable) cardiac defibrillator: Secondary | ICD-10-CM | POA: Diagnosis not present

## 2021-07-08 DIAGNOSIS — D6869 Other thrombophilia: Secondary | ICD-10-CM | POA: Diagnosis not present

## 2021-07-11 DIAGNOSIS — M5137 Other intervertebral disc degeneration, lumbosacral region: Secondary | ICD-10-CM | POA: Diagnosis not present

## 2021-07-11 DIAGNOSIS — M9901 Segmental and somatic dysfunction of cervical region: Secondary | ICD-10-CM | POA: Diagnosis not present

## 2021-07-11 DIAGNOSIS — M50222 Other cervical disc displacement at C5-C6 level: Secondary | ICD-10-CM | POA: Diagnosis not present

## 2021-07-11 DIAGNOSIS — R293 Abnormal posture: Secondary | ICD-10-CM | POA: Diagnosis not present

## 2021-07-11 DIAGNOSIS — M9903 Segmental and somatic dysfunction of lumbar region: Secondary | ICD-10-CM | POA: Diagnosis not present

## 2021-07-11 DIAGNOSIS — M9902 Segmental and somatic dysfunction of thoracic region: Secondary | ICD-10-CM | POA: Diagnosis not present

## 2021-07-11 DIAGNOSIS — M5459 Other low back pain: Secondary | ICD-10-CM | POA: Diagnosis not present

## 2021-07-11 DIAGNOSIS — M791 Myalgia, unspecified site: Secondary | ICD-10-CM | POA: Diagnosis not present

## 2021-07-11 DIAGNOSIS — M542 Cervicalgia: Secondary | ICD-10-CM | POA: Diagnosis not present

## 2021-07-14 NOTE — Telephone Encounter (Signed)
[facility]ott alert for long AT/AF ( 11.40 hr). CVR. + OAC. Exceeded AF Burden 5.5%. Increased from 5% yesterday remote. Clinic aware request monitoring.

## 2021-07-17 ENCOUNTER — Other Ambulatory Visit: Payer: Self-pay | Admitting: Internal Medicine

## 2021-07-17 NOTE — Telephone Encounter (Signed)
Prescription refill request for Eliquis received. Indication: Afib  Last office visit: 03/06/21 ([facility]) Scr: 1.28 (06/12/20)  Age: 75 Weight: 78.8kg  Pt overdue for labs. Called, no answer. LMOM

## 2021-07-18 ENCOUNTER — Telehealth: Payer: Self-pay

## 2021-07-18 NOTE — Telephone Encounter (Signed)
Abbott alert for long AT/AF Event starting 11/3 @ 23:04 and is ongoing Burden 7.6%, Eliquis, Metoprolol, and Sotalol prescribed Route for ongoing episode LR  Unsuccessful telephone encounter to patient to follow up on s/s of ongoing AT/AF with controlled V rates. Known history. Burden has been higher than 7.6 in the past. Engaged with AF clinic. Will confirm medications compliance upon call back. Hipaa compliant VM message left requesting call back to [phone].

## 2021-07-21 DIAGNOSIS — R293 Abnormal posture: Secondary | ICD-10-CM | POA: Diagnosis not present

## 2021-07-21 DIAGNOSIS — M50222 Other cervical disc displacement at C5-C6 level: Secondary | ICD-10-CM | POA: Diagnosis not present

## 2021-07-21 DIAGNOSIS — M9903 Segmental and somatic dysfunction of lumbar region: Secondary | ICD-10-CM | POA: Diagnosis not present

## 2021-07-21 DIAGNOSIS — M5459 Other low back pain: Secondary | ICD-10-CM | POA: Diagnosis not present

## 2021-07-21 DIAGNOSIS — M9901 Segmental and somatic dysfunction of cervical region: Secondary | ICD-10-CM | POA: Diagnosis not present

## 2021-07-21 DIAGNOSIS — M791 Myalgia, unspecified site: Secondary | ICD-10-CM | POA: Diagnosis not present

## 2021-07-21 DIAGNOSIS — M5137 Other intervertebral disc degeneration, lumbosacral region: Secondary | ICD-10-CM | POA: Diagnosis not present

## 2021-07-21 DIAGNOSIS — M9902 Segmental and somatic dysfunction of thoracic region: Secondary | ICD-10-CM | POA: Diagnosis not present

## 2021-07-21 DIAGNOSIS — M542 Cervicalgia: Secondary | ICD-10-CM | POA: Diagnosis not present

## 2021-07-23 NOTE — Telephone Encounter (Signed)
   Called patient and states he is not home to send a transmission at this time. Will send later today. Reports of shortness of breath at times, NADN during conversation. Compliant with medications on file. Has apt with AF clinic tomorrow, 07/24/21.   Will await transmission to review presenting.

## 2021-07-24 ENCOUNTER — Ambulatory Visit (HOSPITAL_COMMUNITY)
Admission: RE | Admit: 2021-07-24 | Discharge: 2021-07-24 | Disposition: A | Discharge status: Home / Self Care | Payer: Medicare Other | Source: Ambulatory Visit | Attending: Nurse Practitioner | Admitting: Nurse Practitioner

## 2021-07-24 ENCOUNTER — Encounter (HOSPITAL_COMMUNITY): Payer: Self-pay | Admitting: Nurse Practitioner

## 2021-07-24 ENCOUNTER — Encounter (HOSPITAL_COMMUNITY): Payer: Self-pay | Admitting: Cardiology

## 2021-07-24 ENCOUNTER — Other Ambulatory Visit: Payer: Self-pay

## 2021-07-24 VITALS — BP 106/86 | HR 111 | Ht 70.0 in | Wt 183.8 lb

## 2021-07-24 DIAGNOSIS — Z79899 Other long term (current) drug therapy: Secondary | ICD-10-CM | POA: Insufficient documentation

## 2021-07-24 DIAGNOSIS — Z95810 Presence of automatic (implantable) cardiac defibrillator: Secondary | ICD-10-CM | POA: Insufficient documentation

## 2021-07-24 DIAGNOSIS — I422 Other hypertrophic cardiomyopathy: Secondary | ICD-10-CM | POA: Insufficient documentation

## 2021-07-24 DIAGNOSIS — I4892 Unspecified atrial flutter: Secondary | ICD-10-CM | POA: Diagnosis not present

## 2021-07-24 DIAGNOSIS — I484 Atypical atrial flutter: Secondary | ICD-10-CM

## 2021-07-24 DIAGNOSIS — D6869 Other thrombophilia: Secondary | ICD-10-CM | POA: Diagnosis not present

## 2021-07-24 DIAGNOSIS — I480 Paroxysmal atrial fibrillation: Secondary | ICD-10-CM | POA: Diagnosis not present

## 2021-07-24 DIAGNOSIS — Z7901 Long term (current) use of anticoagulants: Secondary | ICD-10-CM | POA: Insufficient documentation

## 2021-07-24 LAB — BASIC METABOLIC PANEL
Anion gap: 6 (ref 5–15)
BUN: 19 mg/dL (ref 8–23)
CO2: 26 mmol/L (ref 22–32)
Calcium: 9 mg/dL (ref 8.9–10.3)
Chloride: 106 mmol/L (ref 98–111)
Creatinine, Ser: 1.31 mg/dL — ABNORMAL HIGH (ref 0.61–1.24)
GFR, Estimated: 56 mL/min — ABNORMAL LOW (ref >60)
Glucose, Bld: 135 mg/dL — ABNORMAL HIGH (ref 70–99)
Potassium: 4.4 mmol/L (ref 3.5–5.1)
Sodium: 138 mmol/L (ref 135–145)

## 2021-07-24 LAB — CBC
HCT: 44.8 % (ref 39.0–52.0)
Hemoglobin: 14.8 g/dL (ref 13.0–17.0)
MCH: 29.5 pg (ref 26.0–34.0)
MCHC: 33 g/dL (ref 30.0–36.0)
MCV: 89.4 fL (ref 80.0–100.0)
Platelets: 166 10*3/uL (ref 150–400)
RBC: 5.01 MIL/uL (ref 4.22–5.81)
RDW: 13.5 % (ref 11.5–15.5)
WBC: 7 10*3/uL (ref 4.0–10.5)
nRBC: 0 % (ref 0.0–0.2)

## 2021-07-24 LAB — MAGNESIUM: Magnesium: 2.1 mg/dL (ref 1.7–2.4)

## 2021-07-24 MED ORDER — DILTIAZEM HCL 30 MG PO TABS: ORAL_TABLET | ORAL | 3 refills | Status: DC

## 2021-07-24 NOTE — Progress Notes (Signed)
Attempted to obtain medical history via telephone, unable to reach at this time. I left a voicemail to return pre surgical testing department's phone call.  

## 2021-07-24 NOTE — Progress Notes (Signed)
Primary Care Physician: Ginger Organ., MD Referring Physician:Dr. Allred  EP: Dr. Everlean Patterson Jutte is a 75 y.o. male with a h/o hypertrophic cardiomyopathy, s/p ICD for VT, remote paroxysmal afib, maintaining SR with a very small afib burden on Sotalol. Hospitalized for sotalol administration 02/2017. Chadsvasc score of 2(age, LV dysfunction), continues on eliquis 5 mg bid.  Qtc stable.   He asked to be seen today for afib noted yesterday. Interrogation of ICD/PPM confirms afib on 2/10 and 2/11, mostly starting 2/10 at 11:54 pm and continuing to 5:03 am, 2/11. He states that he drinks alcohol nightly and he often takes his sotalol 14 hours apart. Otherwise, no change from his usual health. Denies snoring. He continues on sotalol.  F/u in afib clinic, 02/14/20. I am seeing him as the device clinic noted onset of atrial flutter, rate controlled since  5/30. In the office he continues in rate controlled aflutter. He is mildly symptomatic with this. He continues on sotalol 120 mg bid, and is compliant. He has 2 alcoholic drinks a week. He had been on BB in the past . Stopped when sotalol loaded. Continues on eliquis 5 mg bid for a CHA2DS2VASc score of 3.   F/u in afib clinic, 03/11/20, as an urgent work-in as he noted increase in HR last night via Fitbit and confirmed with [name]. If not for seeing his HR elevated, he would not have felt  differently out of rhythm. He is on sotalol 120 mg bid and has been on this for several years. He saw Dr. Rayann Heman in June , was doing ok, but it was mentioned in his note, that if afib burden increased, he could consider ablation.  No significant change in health recently. No alcohol, caffeine, tobacco.  By  by the device clinic report this am, he appears to be in an atrial flutter at 117 bpm. BP on arrival 88/70. With a glass of water, BP came up to 104/70. The only medicine he is on is 12.5 metoprolol succinate that would affect BP. This was added in June  on an occasion  when he was out of rhythm. He will usually convert himself.   F/u in afib clinic, 07/18/20. He called the clinic earlier in the week, that he was out of rhythm. He does report that he missed all meds Friday pm and went out of rhythm Saturday pm, including his DOAC. He is in an atrial  Flutter, rate controlled. Reading his ablation OP report, he did  have at least 3 flutter circuits that could not be mapped or ablated. No swallowing or groin issues.  F/u in afib clinic, 11/11. He has returned to [place]. Ekg shows sinus brady at 57 bpm, with first degree AV block.  PR int 236 ms, qrs int 66 ms, qtc 428 ms. He feels well.   F/u 08/02/20. He is now one month out from  ablation and has been in [place] for the last 2 weeks. He feels improved. No issues today.   F/u afib clinic 07/24/21. He went out of rhythm  over the weekend. He has noted more exertional dyspnea and fatigue with this episode. He had vertigo last week with dizziness and N/V with PCP treating with Antivert. It is now better. He has atrial flutter with RVR at 111 bpm.   Today, he denies symptoms of   chest pain, shortness of breath, orthopnea, PND, lower extremity edema, dizziness, presyncope, syncope, or neurologic sequela.  Positive for palpitations..The  patient is tolerating medications without difficulties and is otherwise without complaint today.   Past Medical History:  Diagnosis Date   Acute cholecystitis 06/21/2011   Allergy    SEASONAL   Cataract    Complication of anesthesia    HALLUCINATIONS   GERD (gastroesophageal reflux disease)    Hearing loss    Hemorrhoids    Hiatal hernia    Hypertrophic cardiomyopathy ([facility])    Mitral regurgitation    Moderate   Paroxysmal atrial fibrillation ([place]) 04/2015   documented on ICD remote interrogation, treated with sotalol   Septal defect    LV Septal hyptertrophy 24mm, normal EF, minimal LVOT gradiant.   Tubular adenoma of colon    VT (ventricular tachycardia) ([place])  08/18/2013   s/p St. Jude ICD   Past Surgical History:  Procedure Laterality Date   APPENDECTOMY  1950   ATRIAL FIBRILLATION ABLATION N/A 07/04/2020   Procedure: ATRIAL FIBRILLATION ABLATION;  Surgeon: [name], MD;  Location: [facility];  Service: Cardiovascular;  Laterality: N/A;   CARDIOVERSION N/A 02/25/2017   Procedure: CARDIOVERSION;  Surgeon: Fay Records, MD;  Location: [facility];  Service: Cardiovascular;  Laterality: N/A;   CHOLECYSTECTOMY  06/24/2011   COLONOSCOPY     EP IMPLANTABLE DEVICE N/A 09/03/2016   SJM Fortify Assura Dr generator change by Dr Rayann Heman.  ICD implanted for secondary prevention of sudden death   IMPLANTABLE CARDIOVERTER DEFIBRILLATOR IMPLANT  08/18/2013   St. Jude Fortify Assura DR ICD implanted by Dr Rayann Heman for secondary treatment of VT   IMPLANTABLE CARDIOVERTER DEFIBRILLATOR IMPLANT N/A 08/18/2013   Procedure: IMPLANTABLE CARDIOVERTER DEFIBRILLATOR IMPLANT;  Surgeon: Coralyn Mark, MD;  [facility];  Service: Cardiovascular;  Laterality: N/A;    Current Outpatient Medications  Medication Sig Dispense Refill   calcium carbonate (OSCAL) 1500 (600 Ca) MG TABS tablet Take 600 mg by mouth daily.     cholecalciferol (VITAMIN D) 25 MCG (1000 UNIT) tablet Take 1,000 Units by mouth in the morning and at bedtime.     Coenzyme Q10 (CO Q 10) 100 MG CAPS Take 100 mg by mouth daily.     ELIQUIS 5 MG TABS tablet TAKE 1 TABLET BY MOUTH TWICE DAILY 60 tablet 6   Glucosamine-Chondroitin (COSAMIN DS PO) Take 1 tablet by mouth daily.     metoprolol succinate (TOPROL-XL) 25 MG 24 hr tablet Take 0.5 tablets (12.5 mg total) by mouth in the morning and at bedtime. 135 tablet 3   Omeprazole Magnesium (PRILOSEC OTC PO) Take 10 mg by mouth daily.      Probiotic Product (ALIGN PO) Take 1 capsule by mouth daily.     sotalol (BETAPACE) 120 MG tablet TAKE 1 TABLET(120 MG) BY MOUTH TWICE DAILY 180 tablet 1   Zinc 50 MG CAPS Take 50 mg by mouth 2 (two) times  daily.      No current facility-administered medications for this encounter.    Allergies  Allergen Reactions   Dextromethorphan Other (See Comments)    nervous   Versed [Midazolam] Other (See Comments)    "hallucinations, headache"   Antihistamines, Diphenhydramine-Type Anxiety    Social History   Socioeconomic History   Marital status: Married    Spouse name: Not on file   Number of children: 2   Years of education: Not on file   Highest education level: Not on file  Occupational History   Occupation: retired  Tobacco Use   Smoking status: Former    Packs/day: 2.00  Years: 20.00    Pack years: 40.00    Types: Cigarettes    Quit date: 07/06/1981    Years since quitting: 40.0   Smokeless tobacco: Never  Vaping Use   Vaping Use: Never used  Substance and Sexual Activity   Alcohol use: Yes    Alcohol/week: 0.0 standard drinks    Comment: 2 glasses per day   Drug use: No   Sexual activity: Not on file  Other Topics Concern   Not on file  Social History Narrative   Originally from [place]. Moved to [place] in 2012. Currently has no pets. No bird or hot tub exposure. 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 He did have exposure to Beneze, Acetone, Styrene, & Hydrocarbon early in his career.   Social Determinants of Health   Financial Resource Strain: Not on file  Food Insecurity: Not on file  Transportation Needs: Not on file  Physical Activity: Not on file  Stress: Not on file  Social Connections: Not on file  Intimate Partner Violence: Not on file    Family History  Problem Relation Age of Onset   Hypertrophic cardiomyopathy Mother    Arrhythmia Mother    Heart failure Mother    Pneumonia Father    Diabetes Father    Colon polyps Father    Heart attack Brother    Hypertrophic cardiomyopathy Brother    Heart Problems Brother        VAD   Arrhythmia Brother    Diabetes Brother    Sudden death Brother    Lung disease  Neg Hx     ROS- All systems are reviewed and negative except as per the HPI above  Physical Exam: There were no vitals filed for this visit.  Wt Readings from Last 3 Encounters:  01/15/21 78.8 kg  10/16/20 77.3 kg  08/02/20 77.9 kg    Labs: Lab Results  Component Value Date   NA 141 06/12/2020   K 4.3 06/12/2020   CL 104 06/12/2020   CO2 26 06/12/2020   GLUCOSE 78 06/12/2020   BUN 21 06/12/2020   CREATININE 1.28 (H) 06/12/2020   CALCIUM 9.4 06/12/2020   MG 2.1 07/18/2020   No results found for: INR No results found for: CHOL, HDL, LDLCALC, TRIG   GEN- The patient is well appearing, alert and oriented x 3 today.   Head- normocephalic, atraumatic Eyes-  Sclera clear, conjunctiva pink Ears- hearing intact Oropharynx- clear Neck- supple, no JVP Lymph- no cervical lymphadenopathy Lungs- Clear to ausculation bilaterally, normal work of breathing Heart regular rate and rhythm, no murmurs, rubs or gallops, PMI not laterally displaced GI- soft, NT, ND, + BS Extremities- no clubbing, cyanosis, or edema MS- no significant deformity or atrophy Skin- no rash or lesion Psych- euthymic mood, full affect Neuro- strength and sensation are intact  EKG-  EKG reads acute STEMI,  but this does not fit clinical picture as pt is stable and without chest pain, most likely atrial flutter  at 111 bpm.    1. Hypertrophic cardiomyopathy, sigmoid subtype. Basal septum measures 18  mm in maximal dimension. Flow acceleration noted at the LVOT, no LVOT  obstruction noted. Chordal systolic anterior motion of the mitral valve  without involvment of the anterior  leaflet.   2. Left ventricular ejection fraction, by visual estimation, is 60 to  65%. The left ventricle has normal function. There is severely increased  left ventricular hypertrophy.   3. Elevated left  atrial and left ventricular end-diastolic pressures.   4. Left ventricular diastolic parameters are consistent with Grade III   diastolic dysfunction (restrictive).   5. Global right ventricle has normal systolic function.The right  ventricular size is normal. No increase in right ventricular wall  thickness.   6. Left atrial size was moderately dilated.   7. Right atrial size was moderately dilated.   8. The mitral valve is normal in structure. Mild mitral valve  regurgitation. No evidence of mitral stenosis.   9. The tricuspid valve is normal in structure. Tricuspid valve  regurgitation is mild.  10. The aortic valve is tricuspid. Aortic valve regurgitation is not  visualized. No evidence of aortic valve sclerosis or stenosis.  11. The pulmonic valve was normal in structure. Pulmonic valve  regurgitation is mild.  12. Moderately elevated pulmonary artery systolic pressure.  13. The inferior vena cava is dilated in size with <50% respiratory  variability, suggesting right atrial pressure of 15 mmHg.  14. The left ventricle has no regional wall motion abnormalities.  Echo- 2020----------------------------------------------------------------- Study Conclusions   - Left ventricle: The cavity size was normal. There was moderate   asymmetric hypertrophy of the septum. The appearance was   consistent with hypertrophic cardiomyopathy. Systolic function   was vigorous. The estimated ejection fraction was in the range of   65% to 70%. There was no dynamic obstruction. Wall motion was   normal; there were no regional wall motion abnormalities. - Mitral valve: There was mild to moderate regurgitation directed   centrally. - Left atrium: The atrium was severely dilated. - Right ventricle: The cavity size was mildly dilated. - Right atrium: The atrium was mildly dilated. - Tricuspid valve: There was moderate regurgitation. - Pulmonary arteries: Systolic pressure was mildly increased. PA   peak pressure: 41 mm Hg (S).   Assessment and Plan: 1. Atrial  flutter  S/p  ablation 06/2020  Went  into symptomatic atrial  flutter  this weekend He is on sotalol 120 mg bid  I will set up for cardioversion but in the interim, since BP is soft,  I will try 30 mg Cardizem every 4 hours if needed for HR over 144 and systolic  BP over 818, instead of increasing long acting BB  If he should return to rhythm, he will call office, will confirm with device report and cancel cardioversion  Continue eliquis 5 mg bid for a CHA2DS2VASc score of at least 3,states that he has not missed doses for at least 3 weeks  Cbc/bmet/mag today  2. HCM Stable  St Jude -Merlin ICD Per Dr. Rayann Heman   F/u  in afib office one week after cardioversion   Butch Penny C. Tyrez Berrios, [facility] [address] [place], [place] [address] [phone]

## 2021-07-24 NOTE — Patient Instructions (Signed)
Cardizem 30mg  -- take 1 tablet every 4 hours AS NEEDED for heart rate >100 as long as blood pressure >100.     Cardioversion scheduled for Monday, November 21st  - Arrive at the [facility] and go to admitting at [facility] not eat or drink anything after midnight the night prior to your procedure.  - Take all your morning medication (except diabetic medications) with a sip of water prior to arrival.  - You will not be able to drive home after your procedure.  - Do NOT miss any doses of your blood thinner - if you should miss a dose please notify our office immediately.  - If you feel as if you go back into normal rhythm prior to scheduled cardioversion, please notify our office immediately. If your procedure is canceled in the cardioversion suite you will be charged a cancellation fee.  Patients will be asked to: to mask in public and hand hygiene (no longer quarantine) in the 3 days prior to surgery, to report if any COVID-19-like illness or household contacts to COVID-19 to determine need for testing

## 2021-07-25 ENCOUNTER — Telehealth (HOSPITAL_COMMUNITY): Payer: Self-pay | Admitting: *Deleted

## 2021-07-25 ENCOUNTER — Other Ambulatory Visit (HOSPITAL_COMMUNITY): Payer: Self-pay | Admitting: *Deleted

## 2021-07-25 ENCOUNTER — Encounter (HOSPITAL_COMMUNITY): Payer: Self-pay

## 2021-07-25 MED ORDER — POTASSIUM CHLORIDE ER 10 MEQ PO TBCR: EXTENDED_RELEASE_TABLET | ORAL | 0 refills | Status: DC

## 2021-07-25 MED ORDER — FUROSEMIDE 20 MG PO TABS: ORAL_TABLET | ORAL | 0 refills | Status: DC

## 2021-07-25 NOTE — Telephone Encounter (Signed)
Patient called in with complaints of orthopnea, ankle swelling and abdominal bloating. Pt feels worse today than when seen by Butch Penny yesterday. Per Adline Peals PA will call in lasix 20mg  daily until cardioversion along with Kdur 63meq while on lasix. Cardioversion moved up to 11/16. Pt verbalized understanding of instructions.

## 2021-07-28 ENCOUNTER — Other Ambulatory Visit (HOSPITAL_COMMUNITY): Payer: Self-pay | Admitting: *Deleted

## 2021-07-28 ENCOUNTER — Telehealth: Payer: Self-pay

## 2021-07-28 ENCOUNTER — Encounter (HOSPITAL_COMMUNITY): Payer: Self-pay

## 2021-07-28 DIAGNOSIS — I4819 Other persistent atrial fibrillation: Secondary | ICD-10-CM

## 2021-07-28 MED ORDER — APIXABAN 5 MG PO TABS: 5.0000 mg | ORAL_TABLET | Freq: Two times a day (BID) | ORAL | 6 refills | Status: DC

## 2021-07-28 NOTE — Telephone Encounter (Signed)
Cardioversion scheduled for 07/30/21

## 2021-07-28 NOTE — Telephone Encounter (Signed)
Prescription refill request for Eliquis received. Indication: Afib  Last office visit: 03/06/21 ([facility]) Scr: 1.31 (07/24/21)  Age: 75 Weight: 78.8kg  Appropriate dose and refill sent to requested pharmacy.

## 2021-07-28 NOTE — Telephone Encounter (Signed)
Eliquis refill sent today with new refill encounter; will deny this duplicate.

## 2021-07-29 ENCOUNTER — Other Ambulatory Visit: Payer: Self-pay | Admitting: Internal Medicine

## 2021-07-30 ENCOUNTER — Ambulatory Visit (HOSPITAL_BASED_OUTPATIENT_CLINIC_OR_DEPARTMENT_OTHER): Payer: Medicare Other

## 2021-07-30 ENCOUNTER — Other Ambulatory Visit: Payer: Self-pay

## 2021-07-30 ENCOUNTER — Ambulatory Visit (HOSPITAL_COMMUNITY): Payer: Medicare Other | Admitting: Anesthesiology

## 2021-07-30 ENCOUNTER — Ambulatory Visit (HOSPITAL_COMMUNITY)
Admission: RE | Admit: 2021-07-30 | Discharge: 2021-07-30 | Disposition: A | Discharge status: Home / Self Care | Payer: Medicare Other | Attending: Internal Medicine | Admitting: Internal Medicine

## 2021-07-30 ENCOUNTER — Encounter (HOSPITAL_COMMUNITY)
Admission: RE | Disposition: A | Discharge status: Home / Self Care | Payer: Self-pay | Source: Home / Self Care | Attending: Internal Medicine

## 2021-07-30 ENCOUNTER — Encounter (HOSPITAL_COMMUNITY): Payer: Self-pay | Admitting: Internal Medicine

## 2021-07-30 DIAGNOSIS — K219 Gastro-esophageal reflux disease without esophagitis: Secondary | ICD-10-CM | POA: Diagnosis not present

## 2021-07-30 DIAGNOSIS — I4892 Unspecified atrial flutter: Secondary | ICD-10-CM | POA: Diagnosis not present

## 2021-07-30 DIAGNOSIS — I081 Rheumatic disorders of both mitral and tricuspid valves: Secondary | ICD-10-CM | POA: Diagnosis not present

## 2021-07-30 DIAGNOSIS — I361 Nonrheumatic tricuspid (valve) insufficiency: Secondary | ICD-10-CM | POA: Diagnosis not present

## 2021-07-30 DIAGNOSIS — I480 Paroxysmal atrial fibrillation: Secondary | ICD-10-CM | POA: Diagnosis not present

## 2021-07-30 DIAGNOSIS — I4819 Other persistent atrial fibrillation: Secondary | ICD-10-CM

## 2021-07-30 DIAGNOSIS — I340 Nonrheumatic mitral (valve) insufficiency: Secondary | ICD-10-CM

## 2021-07-30 DIAGNOSIS — I472 Ventricular tachycardia, unspecified: Secondary | ICD-10-CM | POA: Diagnosis not present

## 2021-07-30 HISTORY — PX: TEE WITHOUT CARDIOVERSION: SHX5443

## 2021-07-30 HISTORY — PX: CARDIOVERSION: SHX1299

## 2021-07-30 SURGERY — Surgical Case
Anesthesia: *Unknown

## 2021-07-30 SURGERY — ECHOCARDIOGRAM, TRANSESOPHAGEAL
Anesthesia: Monitor Anesthesia Care

## 2021-07-30 MED ORDER — PROPOFOL 10 MG/ML IV BOLUS
INTRAVENOUS | Status: DC | PRN
  Administered 2021-07-30: 20 mg via INTRAVENOUS
  Administered 2021-07-30: 10 mg via INTRAVENOUS

## 2021-07-30 MED ORDER — SODIUM CHLORIDE 0.9 % IV SOLN: INTRAVENOUS | Status: DC

## 2021-07-30 MED ORDER — PHENYLEPHRINE 40 MCG/ML (10ML) SYRINGE FOR IV PUSH (FOR BLOOD PRESSURE SUPPORT)
PREFILLED_SYRINGE | INTRAVENOUS | Status: DC | PRN
  Administered 2021-07-30: 80 ug via INTRAVENOUS
  Administered 2021-07-30: 120 ug via INTRAVENOUS
  Administered 2021-07-30: 80 ug via INTRAVENOUS

## 2021-07-30 MED ORDER — PROPOFOL 500 MG/50ML IV EMUL
INTRAVENOUS | Status: DC | PRN
  Administered 2021-07-30: 125 ug/kg/min via INTRAVENOUS

## 2021-07-30 MED ORDER — LIDOCAINE 2% (20 MG/ML) 5 ML SYRINGE
INTRAMUSCULAR | Status: DC | PRN
  Administered 2021-07-30: 80 mg via INTRAVENOUS

## 2021-07-30 NOTE — Progress Notes (Signed)
  Echocardiogram Echocardiogram Transesophageal has been performed.  Merrie Roof F 07/30/2021, 9:14 AM

## 2021-07-30 NOTE — Anesthesia Preprocedure Evaluation (Addendum)
Anesthesia Evaluation  Patient identified by MRN, date of birth, ID band Patient awake    Reviewed: Allergy & Precautions, NPO status , Patient's Chart, lab work & pertinent test results  History of Anesthesia Complications Negative for: history of anesthetic complications  Airway Mallampati: II  TM Distance: >3 FB Neck ROM: Full    Dental  (+) Missing,    Pulmonary former smoker,    Pulmonary exam normal        Cardiovascular Normal cardiovascular exam+ dysrhythmias (on Eliquis and sotalol) Atrial Fibrillation and Ventricular Tachycardia + Cardiac Defibrillator   TTE 2020: Hypertrophic cardiomyopathy, sigmoid subtype, basal septum measures 81m in maximal dimension, flow acceleration noted at the LVOT, no LVOT  obstruction noted, chordal systolic anterior motion of the mitral valve without involvment of the anterior leaflet, EF 60-65%, grade III DD, moderate RAE/LAE, mild MR/TR/PR, moderately elevated PASP    Neuro/Psych negative neurological ROS  negative psych ROS   GI/Hepatic Neg liver ROS, hiatal hernia, GERD  Medicated and Controlled,  Endo/Other  negative endocrine ROS  Renal/GU negative Renal ROS  negative genitourinary   Musculoskeletal negative musculoskeletal ROS (+)   Abdominal   Peds  Hematology negative hematology ROS (+)   Anesthesia Other Findings Day of surgery medications reviewed with patient.  Reproductive/Obstetrics negative OB ROS                            Anesthesia Physical Anesthesia Plan  ASA: 3  Anesthesia Plan: MAC   Post-op Pain Management:    Induction:   PONV Risk Score and Plan: 1 and Treatment may vary due to age or medical condition and Propofol infusion  Airway Management Planned: Natural Airway and Nasal Cannula  Additional Equipment: None  Intra-op Plan:   Post-operative Plan:   Informed Consent: I have reviewed the patients History and  Physical, chart, labs and discussed the procedure including the risks, benefits and alternatives for the proposed anesthesia with the patient or authorized representative who has indicated his/her understanding and acceptance.       Plan Discussed with: CRNA  Anesthesia Plan Comments:        Anesthesia Quick Evaluation

## 2021-07-30 NOTE — Anesthesia Procedure Notes (Signed)
Procedure Name: MAC Date/Time: 07/30/2021 8:33 AM Performed by: Dorthea Cove, CRNA Pre-anesthesia Checklist: Patient identified, Emergency Drugs available, Suction available, Patient being monitored and Timeout performed Patient Re-evaluated:Patient Re-evaluated prior to induction Oxygen Delivery Method: Nasal cannula Preoxygenation: Pre-oxygenation with 100% oxygen Induction Type: IV induction Placement Confirmation: positive ETCO2 and CO2 detector Dental Injury: Teeth and Oropharynx as per pre-operative assessment

## 2021-07-30 NOTE — CV Procedure (Signed)
INDICATIONS: Cardioversion  PROCEDURE:   Informed consent was obtained prior to the procedure. The risks, benefits and alternatives for the procedure were discussed and the patient comprehended these risks.  Risks include, but are not limited to, cough, sore throat, vomiting, nausea, somnolence, esophageal and stomach trauma or perforation, bleeding, low blood pressure, aspiration, pneumonia, infection, trauma to the teeth and death.    After a procedural time-out, the oropharynx was anesthetized with 20% benzocaine spray.   During this procedure the patient was administered propofol 307 mg to achieve and maintain moderate conscious sedation.  The patient's heart rate, blood pressure, and oxygen saturationweare monitored continuously during the procedure. The period of conscious sedation was 20 minutes, of which I was present face-to-face 100% of this time.  The transesophageal probe was inserted in the esophagus and stomach without difficulty and multiple views were obtained.  The patient was kept under observation until the patient left the procedure room.  The patient left the procedure room in stable condition.   Agitated microbubble saline contrast was not administered.  COMPLICATIONS:    There were no immediate complications.  FINDINGS:  Normal cardiac function LAA had no thrombus or spontaneous contrast No significant valvular insufficiency  RECOMMENDATIONS:     Continue with current medication  Time Spent Directly with the Patient:  30 minutes   Joseph Morrow 07/30/2021, 9:02 AM

## 2021-07-30 NOTE — Discharge Instructions (Signed)

## 2021-07-30 NOTE — CV Procedure (Signed)
Procedure: Electrical Cardioversion Indications:  Atrial Flutter  Procedure Details:  Consent: Risks of procedure as well as the alternatives and risks of each were explained to the (patient/caregiver).  Consent for procedure obtained.  Time Out: Verified patient identification, verified procedure, site/side was marked, verified correct patient position, special equipment/implants available, medications/allergies/relevent history reviewed, required imaging and test results available. PERFORMED.  Patient placed on cardiac monitor, pulse oximetry, supplemental oxygen as necessary.  Sedation given: Propofol 307 mg Pacer pads placed anterior and posterior chest.  Cardioverted 1 time(s).  Cardioversion with synchronized biphasic 120J shock.  Evaluation: Findings: Post procedure EKG shows: NSR Complications: None Patient did tolerate procedure well.  Time Spent Directly with the Patient:  15 minutes   Janina Mayo, MD   07/30/2021 9:21 AM

## 2021-07-30 NOTE — Anesthesia Postprocedure Evaluation (Signed)
Anesthesia Post Note  Patient: [name]  Procedure(s) Performed: TRANSESOPHAGEAL ECHOCARDIOGRAM (TEE) CARDIOVERSION     Patient location during evaluation: PACU Anesthesia Type: MAC Level of consciousness: awake and alert and oriented Pain management: pain level controlled Vital Signs Assessment: post-procedure vital signs reviewed and stable Respiratory status: spontaneous breathing, nonlabored ventilation and respiratory function stable Cardiovascular status: blood pressure returned to baseline Postop Assessment: no apparent nausea or vomiting Anesthetic complications: no   No notable events documented.  Last Vitals:  Vitals:   07/30/21 0917 07/30/21 0927  BP: 90/69 91/70  Pulse: (!) 59 (!) 59  Resp: 17 15  Temp:    SpO2: 95% 97%    Last Pain:  Vitals:   07/30/21 0909  TempSrc:   PainSc: Asleep                 Joseph Morrow

## 2021-07-30 NOTE — Transfer of Care (Signed)
Immediate Anesthesia Transfer of Care Note  Patient: Joseph Morrow  Procedure(s) Performed: TRANSESOPHAGEAL ECHOCARDIOGRAM (TEE) CARDIOVERSION  Patient Location: Endoscopy Unit  Anesthesia Type:General  Level of Consciousness: awake, alert  and oriented  Airway & Oxygen Therapy: Patient Spontanous Breathing and Patient connected to nasal cannula oxygen  Post-op Assessment: Report given to RN and Post -op Vital signs reviewed and stable  Post vital signs: Reviewed and stable  Last Vitals:  Vitals Value Taken Time  BP 82/60 07/30/21 0913  Temp    Pulse 60 07/30/21 0915  Resp 21 07/30/21 0915  SpO2 95 % 07/30/21 0915  Vitals shown include unvalidated device data.  Last Pain:  Vitals:   07/30/21 0909  TempSrc:   PainSc: Asleep         Complications: No notable events documented.

## 2021-07-30 NOTE — H&P (Addendum)
Cardiology Admission History and Physical:   Patient ID: Joseph Morrow MRN: [id number]; DOB: [birth year]   Admission date: 07/30/2021  PCP:  Ginger Organ., MD   [facility] Providers Cardiologist:  [name], MD        Chief Complaint:  Atrial Flutter/Fib  Patient Profile:   Joseph Morrow is a 75 y.o. male with hx of atrial fibrillation, s/p PVI , CTR for flutter 07/04/2020, HCM with h/o VT s/p ICD, chads2 vasc 3 who is being seen 07/30/2021 for the evaluation of TEE /DCCV.  History of Present Illness:   Joseph Morrow is doing well. He reports no symptoms. We discussed risks and benefits of the procedure   Past Medical History:  Diagnosis Date   Acute cholecystitis 06/21/2011   Allergy    SEASONAL   Cataract    Complication of anesthesia    HALLUCINATIONS   GERD (gastroesophageal reflux disease)    Hearing loss    Hemorrhoids    Hiatal hernia    Hypertrophic cardiomyopathy (HCC)    Mitral regurgitation    Moderate   Paroxysmal atrial fibrillation ([place]) 04/2015   documented on ICD remote interrogation, treated with sotalol   Septal defect    LV Septal hyptertrophy 34mm, normal EF, minimal LVOT gradiant.   Tubular adenoma of colon    VT (ventricular tachycardia) 08/18/2013   s/p St. Jude ICD    Past Surgical History:  Procedure Laterality Date   APPENDECTOMY  1950   ATRIAL FIBRILLATION ABLATION N/A 07/04/2020   Procedure: ATRIAL FIBRILLATION ABLATION;  Surgeon: [name], MD;  Location: [place] [facility];  Service: Cardiovascular;  Laterality: N/A;   CARDIOVERSION N/A 02/25/2017   Procedure: CARDIOVERSION;  Surgeon: Fay Records, MD;  Location: [place];  Service: Cardiovascular;  Laterality: N/A;   CHOLECYSTECTOMY  06/24/2011   COLONOSCOPY     EP IMPLANTABLE DEVICE N/A 09/03/2016   SJM Fortify [address] Dr generator change by Dr Rayann Heman.  ICD implanted for secondary prevention of sudden death   IMPLANTABLE CARDIOVERTER DEFIBRILLATOR  IMPLANT  08/18/2013   St. Jude Fortify [address] DR ICD implanted by Dr Rayann Heman for secondary treatment of VT   IMPLANTABLE CARDIOVERTER DEFIBRILLATOR IMPLANT N/A 08/18/2013   Procedure: IMPLANTABLE CARDIOVERTER DEFIBRILLATOR IMPLANT;  Surgeon: Coralyn Mark, MD;  Location: [facility] CATH LAB;  Service: Cardiovascular;  Laterality: N/A;     Medications Prior to Admission: Prior to Admission medications   Medication Sig Start Date End Date Taking? Authorizing Provider  acetaminophen (TYLENOL) 500 MG tablet Take 500-1,000 mg by mouth every 6 (six) hours as needed.   Yes [provider]  apixaban (ELIQUIS) 5 MG TABS tablet Take 1 tablet (5 mg total) by mouth 2 (two) times daily. 07/28/21  Yes Allred, Jeneen Rinks, MD  calcium carbonate (OSCAL) 1500 (600 Ca) MG TABS tablet Take 600 mg by mouth in the morning and at bedtime.   Yes [provider]  cholecalciferol (VITAMIN D) 25 MCG (1000 UNIT) tablet Take 1,000 Units by mouth in the morning and at bedtime.   Yes [provider]  Coenzyme Q10 (CO Q 10) 100 MG CAPS Take 100 mg by mouth in the morning.   Yes [provider]  diltiazem (CARDIZEM) 30 MG tablet Cardizem 30mg  -- take 1 tablet every 4 hours AS NEEDED for heart rate >100 07/24/21  Yes Sherran Needs, NP  furosemide (LASIX) 20 MG tablet Take 1 tablet by mouth daily until 11/16 then only as needed for swelling/weight gain 07/25/21  Yes Fenton, Clint R, PA  Glucosamine-Chondroitin (COSAMIN DS PO) Take 1 tablet by mouth in the morning.   Yes [provider]  metoprolol succinate (TOPROL-XL) 25 MG 24 hr tablet Take 0.5 tablets (12.5 mg total) by mouth in the morning and at bedtime. 08/06/20  Yes Sherran Needs, NP  omeprazole (PRILOSEC OTC) 20 MG tablet Take 10 mg by mouth in the morning.   Yes [provider]  potassium citrate (UROCIT-K) 10 MEQ (1080 MG) SR tablet Take 10 mEq by mouth daily as needed (with lasix (swelling/weight gain)). 07/25/21  Yes  [provider]  Probiotic Product (ALIGN PO) Take 1 capsule by mouth in the morning.   Yes [provider]  sotalol (BETAPACE) 120 MG tablet TAKE 1 TABLET(120 MG) BY MOUTH TWICE DAILY 06/23/21  Yes Sherran Needs, NP  Zinc 50 MG CAPS Take 50 mg by mouth 2 (two) times daily.    Yes [provider]     Allergies:    Allergies  Allergen Reactions   Dextromethorphan Other (See Comments)    nervous   Versed [Midazolam] Other (See Comments)    "hallucinations, headache"   Antihistamines, Diphenhydramine-Type Anxiety    Social History:   Social History   Socioeconomic History   Marital status: Married    Spouse name: Not on file   Number of children: 2   Years of education: Not on file   Highest education level: Not on file  Occupational History   Occupation: retired  Tobacco Use   Smoking status: Former    Packs/day: 2.00    Years: 20.00    Pack years: 40.00    Types: Cigarettes    Quit date: 07/06/1981    Years since quitting: 40.0   Smokeless tobacco: Never  Vaping Use   Vaping Use: Never used  Substance and Sexual Activity   Alcohol use: Yes    Alcohol/week: 0.0 standard drinks    Comment: 2 glasses per day   Drug use: No   Sexual activity: Not on file  Other Topics Concern   Not on file  Social History Narrative   Originally from [place]. Moved to [place] in 2012. Currently has no pets. No bird or hot tub exposure. 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 He did have exposure to Beneze, Acetone, Styrene, & Hydrocarbon early in his career.   Social Determinants of Health   Financial Resource Strain: Not on file  Food Insecurity: Not on file  Transportation Needs: Not on file  Physical Activity: Not on file  Stress: Not on file  Social Connections: Not on file  Intimate Partner Violence: Not on file    Family History:   The patient's family history includes Arrhythmia in his brother and mother;  Colon polyps in his father; Diabetes in his brother and father; Heart Problems in his brother; Heart attack in his brother; Heart failure in his mother; Hypertrophic cardiomyopathy in his brother and mother; Pneumonia in his father; Sudden death in his brother. There is no history of Lung disease.    ROS:  Please see the history of present illness.  All other ROS reviewed and negative.     Physical Exam/Data:   Vitals:   07/30/21 0748  BP: (!) 122/96  Pulse: (!) 107  Resp: 18  Temp: (!) 97.4 F (36.3 C)  TempSrc: Other (Comment)  SpO2: 99%  Weight: 79.4 kg  Height: 5\' 10"  (1.778 m)   No intake  or output data in the 24 hours ending 07/30/21 0815 Last 3 Weights 07/30/2021 07/24/2021 01/15/2021  Weight (lbs) 175 lb 183 lb 12.8 oz 173 lb 12.8 oz  Weight (kg) 79.379 kg 83.371 kg 78.835 kg     Body mass index is 25.11 kg/m.  General:  Well nourished, well developed, in no acute distress HEENT: normal Neck: no JVD Vascular: No carotid bruits; Distal pulses 2+ bilaterally   Cardiac:  normal S1, S2; RRR; no murmur  Lungs:  clear to auscultation bilaterally, no wheezing, rhonchi or rales  Abd: soft, nontender, no hepatomegaly  Ext: no edema Musculoskeletal:  No deformities, BUE and BLE strength normal and equal Skin: warm and dry  Neuro:  CNs 2-12 intact, no focal abnormalities noted Psych:  Normal affect    EKG:  The ECG that was done  was personally reviewed and demonstrates  07/24/2021 : 2:1 atrial flutter  Relevant CV Studies: 1. Comprehensive electrophysiologic study. 2. Coronary sinus pacing and recording. 3. Three-dimensional mapping of atrial fibrillation with additional mapping and ablation of a second descrete arrhythmia (right atrial flutter) as well as additional ablation within the left atrium for persistent afib 4. Ablation of atrial fibrillation with additional mapping and ablation of a second descrete arrhythmia (right atrial flutter) as well as additional ablation  within the left atrium for persistent afib 5.  Intracardiac echocardiography. 6. Transseptal puncture of an intact septum. 7. Arrhythmia induction with pacing 8. External cardioversion. 9. Right femoral venous ultrasound guided access  TTE 08/23/2019 1. Hypertrophic cardiomyopathy, sigmoid subtype. Basal septum measures 18 mm in maximal dimension. Flow acceleration noted at the LVOT, no LVOT obstruction noted. Chordal systolic anterior motion of the mitral valve without involvment of the anterior leaflet. 2. Left ventricular ejection fraction, by visual estimation, is 60 to 65%. The left ventricle has normal function. There is severely increased left ventricular hypertrophy. 3. Elevated left atrial and left ventricular end-diastolic pressures. 4. Left ventricular diastolic parameters are consistent with Grade III diastolic dysfunction (restrictive). 5. Global right ventricle has normal systolic function.The right ventricular size is normal. No increase in right ventricular wall thickness. 6. Left atrial size was moderately dilated. 7. Right atrial size was moderately dilated. 8. The mitral valve is normal in structure. Mild mitral valve regurgitation. No evidence of mitral stenosis. 9. The tricuspid valve is normal in structure. Tricuspid valve regurgitation is mild. 10. The aortic valve is tricuspid. Aortic valve regurgitation is not visualized. No evidence of aortic valve sclerosis or stenosis. 11. The pulmonic valve was normal in structure. Pulmonic valve regurgitation is mild. 12. Moderately elevated pulmonary artery systolic pressure. 13. The inferior vena cava is dilated in size with <50% respiratory variability, suggesting right atrial pressure of 15 mmHg. 14. The left ventricle has no regional wall motion abnormalities.    Laboratory Data:  High Sensitivity Troponin:  No results for input(s): TROPONINIHS in the last 720 hours.    Chemistry Recent Labs  Lab 07/24/21 0918   NA 138  K 4.4  CL 106  CO2 26  GLUCOSE 135*  BUN 19  CREATININE 1.31*  CALCIUM 9.0  MG 2.1  GFRNONAA 56*  ANIONGAP 6    No results for input(s): PROT, ALBUMIN, AST, ALT, ALKPHOS, BILITOT in the last 168 hours. Lipids No results for input(s): CHOL, TRIG, HDL, LABVLDL, LDLCALC, CHOLHDL in the last 168 hours. Hematology Recent Labs  Lab 07/24/21 0918  WBC 7.0  RBC 5.01  HGB 14.8  HCT 44.8  MCV 89.4  MCH  29.5  MCHC 33.0  RDW 13.5  PLT 166   Thyroid No results for input(s): TSH, FREET4 in the last 168 hours. BNPNo results for input(s): BNP, PROBNP in the last 168 hours.  DDimer No results for input(s): DDIMER in the last 168 hours.   Radiology/Studies:  No results found.   Assessment and Plan:   Atrial Flutter/Fib: Plan for TEE DCCV. Risks and benefits were discussed. Patient is in agreement. No esophageal pathology. He is full code   For questions or updates, please contact [place] Please consult [email or web address] for contact info under     Signed, Janina Mayo, MD  07/30/2021 8:15 AM

## 2021-08-01 ENCOUNTER — Encounter (HOSPITAL_COMMUNITY): Payer: Self-pay | Admitting: Internal Medicine

## 2021-08-01 ENCOUNTER — Telehealth: Payer: Self-pay

## 2021-08-01 NOTE — Telephone Encounter (Signed)
The patient called stating that Doristine Devoid asked him to send a transmission with his home remote monitor. I helped him send one. He feel like he is having A-fib. Transmission received. I told him the nurse will review it and she will send the information to Doristine Devoid.

## 2021-08-01 NOTE — Telephone Encounter (Signed)
Manual transmission reviewed.  Normal device function.  Presenting rhythm SR 68    Attempted to call pt to assess symptoms which he is experiencing that he thinks he is in Af.  No answer, LVM advising not currently in AF, but recent episodes.  Advised am forwarding report to Roderic Palau as indicated.

## 2021-08-01 NOTE — Telephone Encounter (Signed)
Pt notified in NSR. Pt endorses shortness of breath, abdominal bloating and lower extremity edema. Pt has PRN lasix to use - he will take a dose today and repeat tomorrow if symptoms have not improved. Pt states his HR on his BP cuff was 90 so that was why he thought he was in afib - reassured device confirms NSR. Pt will call if issues arise before follow up next week.

## 2021-08-04 ENCOUNTER — Telehealth: Payer: Self-pay

## 2021-08-04 ENCOUNTER — Encounter (HOSPITAL_COMMUNITY): Payer: Self-pay | Admitting: Nurse Practitioner

## 2021-08-04 ENCOUNTER — Ambulatory Visit (HOSPITAL_COMMUNITY)
Admission: RE | Admit: 2021-08-04 | Discharge: 2021-08-04 | Disposition: A | Discharge status: Home / Self Care | Payer: Medicare Other | Source: Ambulatory Visit | Attending: Nurse Practitioner | Admitting: Nurse Practitioner

## 2021-08-04 VITALS — BP 106/76 | HR 83 | Ht 70.0 in | Wt 181.0 lb

## 2021-08-04 DIAGNOSIS — Z95810 Presence of automatic (implantable) cardiac defibrillator: Secondary | ICD-10-CM | POA: Insufficient documentation

## 2021-08-04 DIAGNOSIS — M859 Disorder of bone density and structure, unspecified: Secondary | ICD-10-CM | POA: Diagnosis not present

## 2021-08-04 DIAGNOSIS — I4892 Unspecified atrial flutter: Secondary | ICD-10-CM | POA: Diagnosis not present

## 2021-08-04 DIAGNOSIS — I480 Paroxysmal atrial fibrillation: Secondary | ICD-10-CM | POA: Diagnosis not present

## 2021-08-04 DIAGNOSIS — Z125 Encounter for screening for malignant neoplasm of prostate: Secondary | ICD-10-CM | POA: Diagnosis not present

## 2021-08-04 DIAGNOSIS — E781 Pure hyperglyceridemia: Secondary | ICD-10-CM | POA: Diagnosis not present

## 2021-08-04 DIAGNOSIS — I422 Other hypertrophic cardiomyopathy: Secondary | ICD-10-CM | POA: Diagnosis not present

## 2021-08-04 DIAGNOSIS — D6869 Other thrombophilia: Secondary | ICD-10-CM | POA: Diagnosis not present

## 2021-08-04 DIAGNOSIS — I421 Obstructive hypertrophic cardiomyopathy: Secondary | ICD-10-CM | POA: Diagnosis not present

## 2021-08-04 DIAGNOSIS — I4819 Other persistent atrial fibrillation: Secondary | ICD-10-CM | POA: Diagnosis not present

## 2021-08-04 DIAGNOSIS — Z79899 Other long term (current) drug therapy: Secondary | ICD-10-CM | POA: Insufficient documentation

## 2021-08-04 DIAGNOSIS — Z7901 Long term (current) use of anticoagulants: Secondary | ICD-10-CM | POA: Diagnosis not present

## 2021-08-04 DIAGNOSIS — R7301 Impaired fasting glucose: Secondary | ICD-10-CM | POA: Diagnosis not present

## 2021-08-04 MED ORDER — METOPROLOL SUCCINATE ER 25 MG PO TB24: 25.0000 mg | ORAL_TABLET | Freq: Two times a day (BID) | ORAL | 3 refills | Status: DC

## 2021-08-04 NOTE — Patient Instructions (Addendum)
Increase metoprolol to 25mg  twice a day  Call Wednesday with update of your heart rates/blood pressures on high dose of metoprolol

## 2021-08-04 NOTE — Telephone Encounter (Signed)
Message received from AF clinic that patient would send manual transmission prior to appointment today with Roderic Palau, NP at 3:30 pm. Transmission reviewed. Patient appears to be having AF with RVR, which triggered VT-2 zone with ATP x 1 08/03/21. Present rhythm today is AF with RVR. Routing to appropriate providers for review.

## 2021-08-04 NOTE — Progress Notes (Signed)
Primary Care Physician: Ginger Organ., MD Referring Physician:Dr. Allred  EP: Dr. Everlean Patterson Joseph Morrow is a 75 y.o. male with a h/o hypertrophic cardiomyopathy, s/p ICD for VT, remote paroxysmal afib, maintaining SR with a very small afib burden on Sotalol. Hospitalized for sotalol administration 02/2017. Chadsvasc score of 2(age, LV dysfunction), continues on eliquis 5 mg bid.  Qtc stable.   He asked to be seen today for afib noted yesterday. Interrogation of ICD/PPM confirms afib on 2/10 and 2/11, mostly starting 2/10 at 11:54 pm and continuing to 5:03 am, 2/11. He states that he drinks alcohol nightly and he often takes his sotalol 14 hours apart. Otherwise, no change from his usual health. Denies snoring. He continues on sotalol.  F/u in afib clinic, 02/14/20. I am seeing him as the device clinic noted onset of atrial flutter, rate controlled since  5/30. In the office he continues in rate controlled aflutter. He is mildly symptomatic with this. He continues on sotalol 120 mg bid, and is compliant. He has 2 alcoholic drinks a week. He had been on BB in the past . Stopped when sotalol loaded. Continues on eliquis 5 mg bid for a CHA2DS2VASc score of 3.   F/u in afib clinic, 03/11/20, as an urgent work-in as he noted increase in HR last night via Fitbit and confirmed with [name]. If not for seeing his HR elevated, he would not have felt  differently out of rhythm. He is on sotalol 120 mg bid and has been on this for several years. He saw Dr. Rayann Heman in June , was doing ok, but it was mentioned in his note, that if afib burden increased, he could consider ablation.  No significant change in health recently. No alcohol, caffeine, tobacco.  By  by the device clinic report this am, he appears to be in an atrial flutter at 117 bpm. BP on arrival 88/70. With a glass of water, BP came up to 104/70. The only medicine he is on is 12.5 metoprolol succinate that would affect BP. This was added in June  on an occasion  when he was out of rhythm. He will usually convert himself.   F/u in afib clinic, 07/18/20. He called the clinic earlier in the week, that he was out of rhythm. He does report that he missed all meds Friday pm and went out of rhythm Saturday pm, including his DOAC. He is in an atrial  Flutter, rate controlled. Reading his ablation OP report, he did  have at least 3 flutter circuits that could not be mapped or ablated. No swallowing or groin issues.  F/u in afib clinic, 11/11. He has returned to [place]. Ekg shows sinus brady at 57 bpm, with first degree AV block.  PR int 236 ms, qrs int 66 ms, qtc 428 ms. He feels well.   F/u 08/02/20. He is now one month out from  ablation and has been in [place] for the last 2 weeks. He feels improved. No issues today.   F/u afib clinic 07/24/21. He went out of rhythm  over the weekend. He has noted more exertional dyspnea and fatigue with this episode. He had vertigo last week with dizziness and N/V with PCP treating with Antivert. It is now better. He has atrial flutter with RVR at 111 bpm.   F/u 08/04/21. He is in the clinic as he went out of rhythm yesterday with RVR at times. He hs noted some H/A at times since being  out of rhythm. He just had a successful cardioversion 11/16 but now with ERAF. His ablation was last October.  Because of hypertrophic cardiomyopathy, his best drug choice would be amiodarone if sotalol was stopped.   Today, he denies symptoms of   chest pain, shortness of breath, orthopnea, PND, lower extremity edema, dizziness, presyncope, syncope, or neurologic sequela.  Positive for palpitations..The patient is tolerating medications without difficulties and is otherwise without complaint today.   Past Medical History:  Diagnosis Date   Acute cholecystitis 06/21/2011   Allergy    SEASONAL   Cataract    Complication of anesthesia    HALLUCINATIONS   GERD (gastroesophageal reflux disease)    Hearing loss    Hemorrhoids    Hiatal  hernia    Hypertrophic cardiomyopathy (HCC)    Mitral regurgitation    Moderate   Paroxysmal atrial fibrillation ([place]) 04/2015   documented on ICD remote interrogation, treated with sotalol   Septal defect    LV Septal hyptertrophy 59mm, normal EF, minimal LVOT gradiant.   Tubular adenoma of colon    VT (ventricular tachycardia) 08/18/2013   s/p St. Jude ICD   Past Surgical History:  Procedure Laterality Date   APPENDECTOMY  1950   ATRIAL FIBRILLATION ABLATION N/A 07/04/2020   Procedure: ATRIAL FIBRILLATION ABLATION;  Surgeon: [name], MD;  Location: [facility];  Service: Cardiovascular;  Laterality: N/A;   CARDIOVERSION N/A 02/25/2017   Procedure: CARDIOVERSION;  Surgeon: Fay Records, MD;  Location: [facility] ENDOSCOPY;  Service: Cardiovascular;  Laterality: N/A;   CARDIOVERSION N/A 07/30/2021   Procedure: CARDIOVERSION;  Surgeon: Janina Mayo, MD;  Location: [facility];  Service: Cardiovascular;  Laterality: N/A;   CHOLECYSTECTOMY  06/24/2011   COLONOSCOPY     EP IMPLANTABLE DEVICE N/A 09/03/2016   SJM Fortify [address] Dr generator change by Dr Rayann Heman.  ICD implanted for secondary prevention of sudden death   IMPLANTABLE CARDIOVERTER DEFIBRILLATOR IMPLANT  08/18/2013   St. Jude Fortify Assura DR ICD implanted by Dr Rayann Heman for secondary treatment of VT   IMPLANTABLE CARDIOVERTER DEFIBRILLATOR IMPLANT N/A 08/18/2013   Procedure: IMPLANTABLE CARDIOVERTER DEFIBRILLATOR IMPLANT;  Surgeon: Coralyn Mark, MD;  Location: [facility] CATH LAB;  Service: Cardiovascular;  Laterality: N/A;   TEE WITHOUT CARDIOVERSION N/A 07/30/2021   Procedure: TRANSESOPHAGEAL ECHOCARDIOGRAM (TEE);  Surgeon: Janina Mayo, MD;  Location: [facility] END[facility]COPY;  Service: Cardiovascular;  Laterality: N/A;    Current Outpatient Medications  Medication Sig Dispense Refill   acetaminophen (TYLENOL) 500 MG tablet Take 500-1,000 mg by mouth every 6 (six) hours as needed.     apixaban (ELIQUIS) 5 MG TABS tablet Take 1  tablet (5 mg total) by mouth 2 (two) times daily. 60 tablet 6   calcium carbonate (OSCAL) 1500 (600 Ca) MG TABS tablet Take 600 mg by mouth in the morning and at bedtime.     cholecalciferol (VITAMIN D) 25 MCG (1000 UNIT) tablet Take 1,000 Units by mouth in the morning and at bedtime.     Coenzyme Q10 (CO Q 10) 100 MG CAPS Take 100 mg by mouth in the morning.     diltiazem (CARDIZEM) 30 MG tablet Cardizem 30mg  -- take 1 tablet every 4 hours AS NEEDED for heart rate >100 30 tablet 3   furosemide (LASIX) 20 MG tablet Take 1 tablet by mouth daily until 11/16 then only as needed for swelling/weight gain 15 tablet 0   Glucosamine-Chondroitin (COSAMIN DS PO) Take 1 tablet by mouth in the morning.  omeprazole (PRILOSEC OTC) 20 MG tablet Take 10 mg by mouth in the morning.     potassium citrate (UROCIT-K) 10 MEQ (1080 MG) SR tablet Take 10 mEq by mouth daily as needed (with lasix (swelling/weight gain)).     Probiotic Product (ALIGN PO) Take 1 capsule by mouth in the morning.     sotalol (BETAPACE) 120 MG tablet TAKE 1 TABLET(120 MG) BY MOUTH TWICE DAILY 180 tablet 1   Zinc 50 MG CAPS Take 50 mg by mouth 2 (two) times daily.      metoprolol succinate (TOPROL-XL) 25 MG 24 hr tablet Take 1 tablet (25 mg total) by mouth in the morning and at bedtime. 135 tablet 3   No current facility-administered medications for this encounter.    Allergies  Allergen Reactions   Dextromethorphan Other (See Comments)    nervous   Versed [Midazolam] Other (See Comments)    "hallucinations, headache"   Antihistamines, Diphenhydramine-Type Anxiety    Social History   Socioeconomic History   Marital status: Married    Spouse name: Not on file   Number of children: 2   Years of education: Not on file   Highest education level: Not on file  Occupational History   Occupation: retired  Tobacco Use   Smoking status: Former    Packs/day: 2.00    Years: 20.00    Pack years: 40.00    Types: Cigarettes    Quit  date: 07/06/1981    Years since quitting: 40.1   Smokeless tobacco: Never  Vaping Use   Vaping Use: Never used  Substance and Sexual Activity   Alcohol use: Yes    Alcohol/week: 0.0 standard drinks    Comment: 2 glasses per day   Drug use: No   Sexual activity: Not on file  Other Topics Concern   Not on file  Social History Narrative   Originally from [place]. Moved to [place] in 2012. Currently has no pets. No bird or hot tub exposure. Previously worked running [employer]. He also worked as a Air cabin crew. He did have exposure to Beneze, Acetone, Styrene, & Hydrocarbon early in his career.   Social Determinants of Health   Financial Resource Strain: Not on file  Food Insecurity: Not on file  Transportation Needs: Not on file  Physical Activity: Not on file  Stress: Not on file  Social Connections: Not on file  Intimate Partner Violence: Not on file    Family History  Problem Relation Age of Onset   Hypertrophic cardiomyopathy Mother    Arrhythmia Mother    Heart failure Mother    Pneumonia Father    Diabetes Father    Colon polyps Father    Heart attack Brother    Hypertrophic cardiomyopathy Brother    Heart Problems Brother        VAD   Arrhythmia Brother    Diabetes Brother    Sudden death Brother    Lung disease Neg Hx     ROS- All systems are reviewed and negative except as per the HPI above  Physical Exam: Vitals:   08/04/21 1520  BP: 106/76  Pulse: 83  Weight: 82.1 kg  Height: 5\' 10"  (1.778 m)    Wt Readings from Last 3 Encounters:  08/04/21 82.1 kg  07/30/21 79.4 kg  07/24/21 83.4 kg    Labs: Lab Results  Component Value Date   NA 138 07/24/2021   K 4.4 07/24/2021   CL 106 07/24/2021   CO2 26 07/24/2021  GLUCOSE 135 (H) 07/24/2021   BUN 19 07/24/2021   CREATININE 1.31 (H) 07/24/2021   CALCIUM 9.0 07/24/2021   MG 2.1 07/24/2021   No results found for: INR No results found for: CHOL, HDL, LDLCALC, TRIG   GEN- The patient is well  appearing, alert and oriented x 3 today.   Head- normocephalic, atraumatic Eyes-  Sclera clear, conjunctiva pink Ears- hearing intact Oropharynx- clear Neck- supple, no JVP Lymph- no cervical lymphadenopathy Lungs- Clear to ausculation bilaterally, normal work of breathing Heart regular rate and rhythm, no murmurs, rubs or gallops, PMI not laterally displaced GI- soft, NT, ND, + BS Extremities- no clubbing, cyanosis, or edema MS- no significant deformity or atrophy Skin- no rash or lesion Psych- euthymic mood, full affect Neuro- strength and sensation are intact  EKG-  EKG shows atrial flutter at 83 bpm   1. Hypertrophic cardiomyopathy, sigmoid subtype. Basal septum measures 18  mm in maximal dimension. Flow acceleration noted at the LVOT, no LVOT  obstruction noted. Chordal systolic anterior motion of the mitral valve  without involvment of the anterior  leaflet.   2. Left ventricular ejection fraction, by visual estimation, is 60 to  65%. The left ventricle has normal function. There is severely increased  left ventricular hypertrophy.   3. Elevated left atrial and left ventricular end-diastolic pressures.   4. Left ventricular diastolic parameters are consistent with Grade III  diastolic dysfunction (restrictive).   5. Global right ventricle has normal systolic function.The right  ventricular size is normal. No increase in right ventricular wall  thickness.   6. Left atrial size was moderately dilated.   7. Right atrial size was moderately dilated.   8. The mitral valve is normal in structure. Mild mitral valve  regurgitation. No evidence of mitral stenosis.   9. The tricuspid valve is normal in structure. Tricuspid valve  regurgitation is mild.  10. The aortic valve is tricuspid. Aortic valve regurgitation is not  visualized. No evidence of aortic valve sclerosis or stenosis.  11. The pulmonic valve was normal in structure. Pulmonic valve  regurgitation is mild.  12.  Moderately elevated pulmonary artery systolic pressure.  13. The inferior vena cava is dilated in size with <50% respiratory  variability, suggesting right atrial pressure of 15 mmHg.  14. The left ventricle has no regional wall motion abnormalities.  Echo- 2020----------------------------------------------------------------- Study Conclusions   - Left ventricle: The cavity size was normal. There was moderate   asymmetric hypertrophy of the septum. The appearance was   consistent with hypertrophic cardiomyopathy. Systolic function   was vigorous. The estimated ejection fraction was in the range of   65% to 70%. There was no dynamic obstruction. Wall motion was   normal; there were no regional wall motion abnormalities. - Mitral valve: There was mild to moderate regurgitation directed   centrally. - Left atrium: The atrium was severely dilated. - Right ventricle: The cavity size was mildly dilated. - Right atrium: The atrium was mildly dilated. - Tricuspid valve: There was moderate regurgitation. - Pulmonary arteries: Systolic pressure was mildly increased. PA   peak pressure: 41 mm Hg (S).   Assessment and Plan: 1. Atrial  flutter  S/p  ablation 06/2020  Went  into symptomatic atrial flutter recently and had a successful CV but ERAF I discussed with Dr. Rayann Heman He feels options are repeat ablation vrs change to amiodarone from sotalol In the interim, he thought we  could try increase in metoprolol  Pt is interested in speaking  to EP re repeat ablation and will request appointment  He will continue on sotalol 120 mg bid and I will increase metoprolol to 25 mg bid  He will call the office on Wednesday to report HR's Continue eliquis 5 mg bid for a CHA2DS2VASc score of at least 3   2. HCM Stable  St Jude -[employer]lin ICD Per Dr. Rayann Heman   F/u with EP to discuss repeat ablation   Butch Penny C. Taylin Leder, [facility] [address] [place], [place]  [address] [phone]

## 2021-08-06 ENCOUNTER — Ambulatory Visit (HOSPITAL_COMMUNITY): Payer: Medicare Other | Admitting: Nurse Practitioner

## 2021-08-06 ENCOUNTER — Telehealth (HOSPITAL_COMMUNITY): Payer: Self-pay | Admitting: *Deleted

## 2021-08-06 DIAGNOSIS — N1831 Chronic kidney disease, stage 3a: Secondary | ICD-10-CM | POA: Diagnosis not present

## 2021-08-06 DIAGNOSIS — D6869 Other thrombophilia: Secondary | ICD-10-CM | POA: Diagnosis not present

## 2021-08-06 DIAGNOSIS — Z23 Encounter for immunization: Secondary | ICD-10-CM | POA: Diagnosis not present

## 2021-08-06 DIAGNOSIS — R7401 Elevation of levels of liver transaminase levels: Secondary | ICD-10-CM | POA: Diagnosis not present

## 2021-08-06 DIAGNOSIS — G3184 Mild cognitive impairment, so stated: Secondary | ICD-10-CM | POA: Diagnosis not present

## 2021-08-06 DIAGNOSIS — I421 Obstructive hypertrophic cardiomyopathy: Secondary | ICD-10-CM | POA: Diagnosis not present

## 2021-08-06 DIAGNOSIS — E781 Pure hyperglyceridemia: Secondary | ICD-10-CM | POA: Diagnosis not present

## 2021-08-06 DIAGNOSIS — Z1331 Encounter for screening for depression: Secondary | ICD-10-CM | POA: Diagnosis not present

## 2021-08-06 DIAGNOSIS — R7301 Impaired fasting glucose: Secondary | ICD-10-CM | POA: Diagnosis not present

## 2021-08-06 DIAGNOSIS — Z8679 Personal history of other diseases of the circulatory system: Secondary | ICD-10-CM | POA: Diagnosis not present

## 2021-08-06 DIAGNOSIS — Z0000 Encounter for general adult medical examination without abnormal findings: Secondary | ICD-10-CM | POA: Diagnosis not present

## 2021-08-06 DIAGNOSIS — Z1339 Encounter for screening examination for other mental health and behavioral disorders: Secondary | ICD-10-CM | POA: Diagnosis not present

## 2021-08-06 DIAGNOSIS — I480 Paroxysmal atrial fibrillation: Secondary | ICD-10-CM | POA: Diagnosis not present

## 2021-08-06 DIAGNOSIS — M8580 Other specified disorders of bone density and structure, unspecified site: Secondary | ICD-10-CM | POA: Diagnosis not present

## 2021-08-06 DIAGNOSIS — R82998 Other abnormal findings in urine: Secondary | ICD-10-CM | POA: Diagnosis not present

## 2021-08-06 DIAGNOSIS — R0683 Snoring: Secondary | ICD-10-CM | POA: Diagnosis not present

## 2021-08-06 NOTE — Telephone Encounter (Signed)
Pt HR 68-72 feeling some better.He will continue metoprolol 25mg  BID and follow up with EP to discuss ablation further. Pt will call if issues arise.

## 2021-08-10 ENCOUNTER — Encounter: Payer: Self-pay | Admitting: Internal Medicine

## 2021-08-11 ENCOUNTER — Telehealth: Payer: Self-pay

## 2021-08-11 NOTE — Telephone Encounter (Signed)
Manual transmission received.  Noted pt has been in communication with AF clinic.  Forwarding info to AF clinic.    [facility] alert for exceed AT/AF Presenting AF with CVR, improved rates with increased Metoprolol Burden 16%, Eliquis prescribed

## 2021-08-12 ENCOUNTER — Ambulatory Visit (HOSPITAL_COMMUNITY): Payer: Medicare Other | Admitting: Nurse Practitioner

## 2021-08-12 DIAGNOSIS — H938X3 Other specified disorders of ear, bilateral: Secondary | ICD-10-CM | POA: Diagnosis not present

## 2021-08-12 NOTE — Telephone Encounter (Signed)
Pt to see Dr. Quentin Ore in December to discuss afib ablation. Continue current medications.

## 2021-08-13 DIAGNOSIS — R293 Abnormal posture: Secondary | ICD-10-CM | POA: Diagnosis not present

## 2021-08-13 DIAGNOSIS — M5137 Other intervertebral disc degeneration, lumbosacral region: Secondary | ICD-10-CM | POA: Diagnosis not present

## 2021-08-13 DIAGNOSIS — M50222 Other cervical disc displacement at C5-C6 level: Secondary | ICD-10-CM | POA: Diagnosis not present

## 2021-08-13 DIAGNOSIS — M791 Myalgia, unspecified site: Secondary | ICD-10-CM | POA: Diagnosis not present

## 2021-08-13 DIAGNOSIS — M5459 Other low back pain: Secondary | ICD-10-CM | POA: Diagnosis not present

## 2021-08-13 DIAGNOSIS — M9901 Segmental and somatic dysfunction of cervical region: Secondary | ICD-10-CM | POA: Diagnosis not present

## 2021-08-13 DIAGNOSIS — M542 Cervicalgia: Secondary | ICD-10-CM | POA: Diagnosis not present

## 2021-08-13 DIAGNOSIS — M9902 Segmental and somatic dysfunction of thoracic region: Secondary | ICD-10-CM | POA: Diagnosis not present

## 2021-08-13 DIAGNOSIS — M9903 Segmental and somatic dysfunction of lumbar region: Secondary | ICD-10-CM | POA: Diagnosis not present

## 2021-08-18 DIAGNOSIS — M5137 Other intervertebral disc degeneration, lumbosacral region: Secondary | ICD-10-CM | POA: Diagnosis not present

## 2021-08-18 DIAGNOSIS — M50222 Other cervical disc displacement at C5-C6 level: Secondary | ICD-10-CM | POA: Diagnosis not present

## 2021-08-18 DIAGNOSIS — R293 Abnormal posture: Secondary | ICD-10-CM | POA: Diagnosis not present

## 2021-08-18 DIAGNOSIS — M5459 Other low back pain: Secondary | ICD-10-CM | POA: Diagnosis not present

## 2021-08-18 DIAGNOSIS — M9903 Segmental and somatic dysfunction of lumbar region: Secondary | ICD-10-CM | POA: Diagnosis not present

## 2021-08-18 DIAGNOSIS — M9901 Segmental and somatic dysfunction of cervical region: Secondary | ICD-10-CM | POA: Diagnosis not present

## 2021-08-18 DIAGNOSIS — M791 Myalgia, unspecified site: Secondary | ICD-10-CM | POA: Diagnosis not present

## 2021-08-18 DIAGNOSIS — M9902 Segmental and somatic dysfunction of thoracic region: Secondary | ICD-10-CM | POA: Diagnosis not present

## 2021-08-18 DIAGNOSIS — M542 Cervicalgia: Secondary | ICD-10-CM | POA: Diagnosis not present

## 2021-09-01 DIAGNOSIS — M9901 Segmental and somatic dysfunction of cervical region: Secondary | ICD-10-CM | POA: Diagnosis not present

## 2021-09-01 DIAGNOSIS — M5137 Other intervertebral disc degeneration, lumbosacral region: Secondary | ICD-10-CM | POA: Diagnosis not present

## 2021-09-01 DIAGNOSIS — M791 Myalgia, unspecified site: Secondary | ICD-10-CM | POA: Diagnosis not present

## 2021-09-01 DIAGNOSIS — M9903 Segmental and somatic dysfunction of lumbar region: Secondary | ICD-10-CM | POA: Diagnosis not present

## 2021-09-01 DIAGNOSIS — M9902 Segmental and somatic dysfunction of thoracic region: Secondary | ICD-10-CM | POA: Diagnosis not present

## 2021-09-01 DIAGNOSIS — R293 Abnormal posture: Secondary | ICD-10-CM | POA: Diagnosis not present

## 2021-09-01 DIAGNOSIS — M50222 Other cervical disc displacement at C5-C6 level: Secondary | ICD-10-CM | POA: Diagnosis not present

## 2021-09-01 DIAGNOSIS — M5459 Other low back pain: Secondary | ICD-10-CM | POA: Diagnosis not present

## 2021-09-01 DIAGNOSIS — M542 Cervicalgia: Secondary | ICD-10-CM | POA: Diagnosis not present

## 2021-09-03 ENCOUNTER — Telehealth: Payer: Self-pay

## 2021-09-03 LAB — CUP PACEART REMOTE DEVICE CHECK
Battery Remaining Longevity: 50 mo
Battery Remaining Percentage: 50 %
Battery Voltage: 2.95 V
Brady Statistic AP VP Percent: 1.3 %
Brady Statistic AP VS Percent: 31 %
Brady Statistic AS VP Percent: 1 %
Brady Statistic AS VS Percent: 66 %
Brady Statistic RA Percent Paced: 24 %
Brady Statistic RV Percent Paced: 7.1 %
Date Time Interrogation Session: 20221221100604
HighPow Impedance: 51 Ohm
HighPow Impedance: 52 Ohm
Implantable Lead Implant Date: 20141205
Implantable Lead Implant Date: 20141205
Implantable Lead Location: 753859
Implantable Lead Location: 753860
Implantable Lead Model: 7121
Implantable Pulse Generator Implant Date: 20171221
Lead Channel Impedance Value: 460 Ohm
Lead Channel Impedance Value: 560 Ohm
Lead Channel Pacing Threshold Amplitude: 0.5 V
Lead Channel Pacing Threshold Amplitude: 1 V
Lead Channel Pacing Threshold Pulse Width: 0.5 ms
Lead Channel Pacing Threshold Pulse Width: 0.5 ms
Lead Channel Sensing Intrinsic Amplitude: 1.2 mV
Lead Channel Sensing Intrinsic Amplitude: 12 mV
Lead Channel Setting Pacing Amplitude: 2 V
Lead Channel Setting Pacing Amplitude: 2.5 V
Lead Channel Setting Pacing Pulse Width: 0.5 ms
Lead Channel Setting Sensing Sensitivity: 0.5 mV
Pulse Gen Serial Number: 7387049

## 2021-09-03 NOTE — Telephone Encounter (Signed)
Patient called stating that Roderic Palau had requested patient to send a manual transmission from his pacemaker and to forward the information over to her:

## 2021-09-04 ENCOUNTER — Encounter: Payer: Self-pay | Admitting: Internal Medicine

## 2021-09-05 ENCOUNTER — Ambulatory Visit (INDEPENDENT_AMBULATORY_CARE_PROVIDER_SITE_OTHER): Payer: Medicare Other

## 2021-09-05 DIAGNOSIS — I422 Other hypertrophic cardiomyopathy: Secondary | ICD-10-CM | POA: Diagnosis not present

## 2021-09-09 NOTE — Progress Notes (Signed)
Electrophysiology Office Follow up Visit Note:    Date:  09/10/2021   ID:  Joseph Morrow, DOB September 08, 1946, MRN [id number]  PCP:  Ginger Organ., MD  [facility] Cardiologist:  None  [facility] Electrophysiologist:  Vickie Epley, MD    Interval History:    Joseph Morrow is a 75 y.o. male who presents for a follow up visit for persistent atrial fibrillation. He previously had an AF ablation by Dr Rayann Heman 07/04/2020. During the procedure the veins and posterior wall were isolated and a CTI was ablated. There were multiple left atrial flutter circuits described during the procedure. He continues to have fibrillation and is referred to me to discuss repeat ablation.  He is symptomatic while he is out of rhythm.  He is short of breath and also retains little bit of fluid.  The patient describes likely sleep apnea at home based on his wife's description.  He has fatigue.    Past Medical History:  Diagnosis Date   Acute cholecystitis 06/21/2011   Allergy    SEASONAL   Cataract    Complication of anesthesia    HALLUCINATIONS   GERD (gastroesophageal reflux disease)    Hearing loss    Hemorrhoids    Hiatal hernia    Hypertrophic cardiomyopathy (HCC)    Mitral regurgitation    Moderate   Paroxysmal atrial fibrillation ([place]) 04/2015   documented on ICD remote interrogation, treated with sotalol   Septal defect    LV Septal hyptertrophy 74mm, normal EF, minimal LVOT gradiant.   Tubular adenoma of colon    VT (ventricular tachycardia) 08/18/2013   s/p St. Jude ICD    Past Surgical History:  Procedure Laterality Date   APPENDECTOMY  1950   ATRIAL FIBRILLATION ABLATION N/A 07/04/2020   Procedure: ATRIAL FIBRILLATION ABLATION;  Surgeon: [name], MD;  Location: [place] CV LAB;  Service: Cardiovascular;  Laterality: N/A;   CARDIOVERSION N/A 02/25/2017   Procedure: CARDIOVERSION;  Surgeon: Fay Records, MD;  Location: [facility] ENDOSCOPY;  Service:  Cardiovascular;  Laterality: N/A;   CARDIOVERSION N/A 07/30/2021   Procedure: CARDIOVERSION;  Surgeon: Janina Mayo, MD;  Location: [place];  Service: Cardiovascular;  Laterality: N/A;   CHOLECYSTECTOMY  06/24/2011   COLONOSCOPY     EP IMPLANTABLE DEVICE N/A 09/03/2016   SJM Fortify Assura Dr generator change by Dr Rayann Heman.  ICD implanted for secondary prevention of sudden death   IMPLANTABLE CARDIOVERTER DEFIBRILLATOR IMPLANT  08/18/2013   St. Jude Fortify Assura DR ICD implanted by Dr Rayann Heman for secondary treatment of VT   IMPLANTABLE CARDIOVERTER DEFIBRILLATOR IMPLANT N/A 08/18/2013   Procedure: IMPLANTABLE CARDIOVERTER DEFIBRILLATOR IMPLANT;  Surgeon: Coralyn Mark, MD;  Location: [facility] CATH LAB;  Service: Cardiovascular;  Laterality: N/A;   TEE WITHOUT CARDIOVERSION N/A 07/30/2021   Procedure: TRANSESOPHAGEAL ECHOCARDIOGRAM (TEE);  Surgeon: Janina Mayo, MD;  Location: [facility] ENDOSCOPY;  Service: Cardiovascular;  Laterality: N/A;    Current Medications: Current Meds  Medication Sig   acetaminophen (TYLENOL) 500 MG tablet Take 500-1,000 mg by mouth every 6 (six) hours as needed.   apixaban (ELIQUIS) 5 MG TABS tablet Take 1 tablet (5 mg total) by mouth 2 (two) times daily.   calcium carbonate (OSCAL) 1500 (600 Ca) MG TABS tablet Take 600 mg by mouth in the morning and at bedtime.   cholecalciferol (VITAMIN D) 25 MCG (1000 UNIT) tablet Take 1,000 Units by mouth in the morning and at bedtime.   Coenzyme Q10 (CO Q 10)  100 MG CAPS Take 100 mg by mouth in the morning.   Glucosamine-Chondroitin (COSAMIN DS PO) Take 1 tablet by mouth in the morning.   metoprolol succinate (TOPROL-XL) 25 MG 24 hr tablet Take 1 tablet (25 mg total) by mouth in the morning and at bedtime.   omeprazole (PRILOSEC OTC) 20 MG tablet Take 10 mg by mouth in the morning.   Probiotic Product (ALIGN PO) Take 1 capsule by mouth in the morning.   sotalol (BETAPACE) 120 MG tablet TAKE 1 TABLET(120 MG) BY MOUTH TWICE DAILY    Zinc 50 MG CAPS Take 50 mg by mouth 2 (two) times daily.      Allergies:   Dextromethorphan; Versed [midazolam]; and Antihistamines, diphenhydramine-type   Social History   Socioeconomic History   Marital status: Married    Spouse name: Not on file   Number of children: 2   Years of education: Not on file   Highest education level: Not on file  Occupational History   Occupation: retired  Tobacco Use   Smoking status: Former    Packs/day: 2.00    Years: 20.00    Pack years: 40.00    Types: Cigarettes    Quit date: 07/06/1981    Years since quitting: 40.2   Smokeless tobacco: Never  Vaping Use   Vaping Use: Never used  Substance and Sexual Activity   Alcohol use: Yes    Alcohol/week: 0.0 standard drinks    Comment: 2 glasses per day   Drug use: No   Sexual activity: Not on file  Other Topics Concern   Not on file  Social History Narrative   Originally from [place]. Moved to [place] in 2012. Currently has no pets. No bird or hot tub exposure. 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 He did have exposure to Beneze, Acetone, Styrene, & Hydrocarbon early in his career.   Social Determinants of Health   Financial Resource Strain: Not on file  Food Insecurity: Not on file  Transportation Needs: Not on file  Physical Activity: Not on file  Stress: Not on file  Social Connections: Not on file     Family History: The patient's family history includes Arrhythmia in his brother and mother; Colon polyps in his father; Diabetes in his brother and father; Heart Problems in his brother; Heart attack in his brother; Heart failure in his mother; Hypertrophic cardiomyopathy in his brother and mother; Pneumonia in his father; Sudden death in his brother. There is no history of Lung disease.  ROS:   Please see the history of present illness.    All other systems reviewed and are negative.  EKGs/Labs/Other Studies Reviewed:    The following studies  were reviewed today: Prior ablation  09/03/2021 Remote interrogation 25% AF burden  September 10, 2021 in clinic device interrogation personally reviewed Longevity 4.3 years Lead parameter stable AF burden 27% No therapies delivered VT episodes appear to be A. fib with RVR based on far field EGM morphology.  EKG:  The ekg ordered today demonstrates atypical atrial flutter with a ventricular rate of 72 bpm.  Recent Labs: 07/24/2021: BUN 19; Creatinine, Ser 1.31; Hemoglobin 14.8; Magnesium 2.1; Platelets 166; Potassium 4.4; Sodium 138  Recent Lipid Panel No results found for: CHOL, TRIG, HDL, CHOLHDL, VLDL, LDLCALC, LDLDIRECT  Physical Exam:    VS:  BP 124/70    Pulse 72    Ht 5\' 10"  (1.778 m)    Wt 184 lb 6.4 oz (83.6  kg)    SpO2 98%    BMI 26.46 kg/m     Wt Readings from Last 3 Encounters:  09/10/21 184 lb 6.4 oz (83.6 kg)  08/04/21 181 lb (82.1 kg)  07/30/21 175 lb (79.4 kg)     GEN: Well nourished, well developed in no acute distress HEENT: Normal NECK: No JVD; No carotid bruits LYMPHATICS: No lymphadenopathy CARDIAC: Irregularly irregular, no murmurs, rubs, gallops RESPIRATORY:  Clear to auscultation without rales, wheezing or rhonchi  ABDOMEN: Soft, non-tender, non-distended MUSCULOSKELETAL: 1+ pitting bilateral lower extremity edema; No deformity  SKIN: Warm and dry NEUROLOGIC:  Alert and oriented x 3 PSYCHIATRIC:  Normal affect        ASSESSMENT:    1. PAF (paroxysmal atrial fibrillation) ([place])   2. VT (ventricular tachycardia) ([place])   3. Hypertrophic cardiomyopathy ([place])   4. ICD (implantable cardioverter-defibrillator) in place   5. Atrial fibrillation, unspecified type ([place])    PLAN:    In order of problems listed above:  #Persistent atrial fibrillation/flutter Symptomatic.  Recurrent after previous ablation during which left atrial flutters were described.  He should continue taking Eliquis for stroke prophylaxis.  I think a repeat ablation is  indicated to target the left atrial circuits and to recheck his pulmonary vein/posterior wall.  I discussed the repeat ablation procedure in detail with the patient during today's visit include the risk, recovery and likelihood of success.  We discussed the possibility of him having additional circuits after the ablation with recurrence of arrhythmia.  If this were to happen, would consider starting an antiarrhythmic drug.  We would need a CT scan prior to the redo ablation to reassess the pulmonary vein architecture.  he should continue taking sotalol 120 mg by mouth twice daily.   Risk, benefits, and alternatives to EP study and radiofrequency ablation for afib were also discussed in detail today. These risks include but are not limited to stroke, bleeding, vascular damage, tamponade, perforation, damage to the esophagus, lungs, and other structures, pulmonary vein stenosis, worsening renal function, and death. The patient understands these risk and wishes to proceed.  We will therefore proceed with catheter ablation at the next available time.  Carto, ICE, anesthesia are requested for the procedure.  Will also obtain CT PV protocol prior to the procedure to exclude LAA thrombus and further evaluate atrial anatomy.   #Hypertrophic cardiomyopathy, ventricular tachycardia, ICD in situ Device functioning appropriately.  No recent therapies. Rhythm control is indicated as above  #Probable sleep disordered breathing Itamr sleep study ordered  #Slight volume overload, acute on chronic diastolic heart failure I like him to restart his Lasix and potassium for the next 4 days.  He will then go to as needed dosing based on weight.  He will check his weight daily and take a Lasix dose if his weight increases by greater than 3 pounds on 2 consecutive days.   Planning for redo PVI/flutter ablation in early January.     Total time spent with patient today 45 minutes. This includes reviewing records,  evaluating the patient and coordinating care.   Medication Adjustments/Labs and Tests Ordered: Current medicines are reviewed at length with the patient today.  Concerns regarding medicines are outlined above.  Orders Placed This Encounter  Procedures   CT CARDIAC MORPH/PULM VEIN W/CM&W/O CA SCORE   CBC w/Diff   Basic Metabolic Panel (BMET)   EKG 12-Lead   Meds ordered this encounter  Medications   furosemide (LASIX) 20 MG tablet  Sig: Take 1 tablet by mouth daily until 11/16 then only as needed for swelling/weight gain    Dispense:  30 tablet    Refill:  11   potassium citrate (UROCIT-K) 10 MEQ (1080 MG) SR tablet    Sig: Take 1 tablet (10 mEq total) by mouth daily as needed (with lasix (swelling/weight gain)).    Dispense:  30 tablet    Refill:  11     Signed, Lars Mage, MD, [facility], [facility] 09/10/2021 10:58 AM    Electrophysiology [facility]

## 2021-09-09 NOTE — H&P (View-Only) (Signed)
Electrophysiology Office Follow up Visit Note:    Date:  09/10/2021   ID:  Joseph Morrow, DOB [birth year], MRN [id number]  PCP:  Ginger Organ., MD  [facility] Cardiologist:  None  [facility] Electrophysiologist:  Vickie Epley, MD    Interval History:    Joseph Morrow is a 75 y.o. male who presents for a follow up visit for persistent atrial fibrillation. He previously had an AF ablation by Dr Rayann Heman 07/04/2020. During the procedure the veins and posterior wall were isolated and a CTI was ablated. There were multiple left atrial flutter circuits described during the procedure. He continues to have fibrillation and is referred to me to discuss repeat ablation.  He is symptomatic while he is out of rhythm.  He is short of breath and also retains little bit of fluid.  The patient describes likely sleep apnea at home based on his wife's description.  He has fatigue.    Past Medical History:  Diagnosis Date   Acute cholecystitis 06/21/2011   Allergy    SEASONAL   Cataract    Complication of anesthesia    HALLUCINATIONS   GERD (gastroesophageal reflux disease)    Hearing loss    Hemorrhoids    Hiatal hernia    Hypertrophic cardiomyopathy (HCC)    Mitral regurgitation    Moderate   Paroxysmal atrial fibrillation ([place]) 04/2015   documented on ICD remote interrogation, treated with sotalol   Septal defect    LV Septal hyptertrophy 18mm, normal EF, minimal LVOT gradiant.   Tubular adenoma of colon    VT (ventricular tachycardia) 08/18/2013   s/p St. Jude ICD    Past Surgical History:  Procedure Laterality Date   APPENDECTOMY  1950   ATRIAL FIBRILLATION ABLATION N/A 07/04/2020   Procedure: ATRIAL FIBRILLATION ABLATION;  Surgeon: [name], MD;  Location: [place] CV LAB;  Service: Cardiovascular;  Laterality: N/A;   CARDIOVERSION N/A 02/25/2017   Procedure: CARDIOVERSION;  Surgeon: Fay Records, MD;  Location: [facility] ENDOSCOPY;  Service:  Cardiovascular;  Laterality: N/A;   CARDIOVERSION N/A 07/30/2021   Procedure: CARDIOVERSION;  Surgeon: Janina Mayo, MD;  Location: [place];  Service: Cardiovascular;  Laterality: N/A;   CHOLECYSTECTOMY  06/24/2011   COLONOSCOPY     EP IMPLANTABLE DEVICE N/A 09/03/2016   SJM Fortify Assura Dr generator change by Dr Rayann Heman.  ICD implanted for secondary prevention of sudden death   IMPLANTABLE CARDIOVERTER DEFIBRILLATOR IMPLANT  08/18/2013   St. Jude Fortify Assura DR ICD implanted by Dr Rayann Heman for secondary treatment of VT   IMPLANTABLE CARDIOVERTER DEFIBRILLATOR IMPLANT N/A 08/18/2013   Procedure: IMPLANTABLE CARDIOVERTER DEFIBRILLATOR IMPLANT;  Surgeon: Coralyn Mark, MD;  [facility] CATH LAB;  Service: Cardiovascular;  Laterality: N/A;   TEE WITHOUT CARDIOVERSION N/A 07/30/2021   Procedure: TRANSESOPHAGEAL ECHOCARDIOGRAM (TEE);  Surgeon: Janina Mayo, MD;  [facility] ENDOSCOPY;  Service: Cardiovascular;  Laterality: N/A;    Current Medications: Current Meds  Medication Sig   acetaminophen (TYLENOL) 500 MG tablet Take 500-1,000 mg by mouth every 6 (six) hours as needed.   apixaban (ELIQUIS) 5 MG TABS tablet Take 1 tablet (5 mg total) by mouth 2 (two) times daily.   calcium carbonate (OSCAL) 1500 (600 Ca) MG TABS tablet Take 600 mg by mouth in the morning and at bedtime.   cholecalciferol (VITAMIN D) 25 MCG (1000 UNIT) tablet Take 1,000 Units by mouth in the morning and at bedtime.   Coenzyme Q10 (CO Q 10)  100 MG CAPS Take 100 mg by mouth in the morning.   Glucosamine-Chondroitin (COSAMIN DS PO) Take 1 tablet by mouth in the morning.   metoprolol succinate (TOPROL-XL) 25 MG 24 hr tablet Take 1 tablet (25 mg total) by mouth in the morning and at bedtime.   omeprazole (PRILOSEC OTC) 20 MG tablet Take 10 mg by mouth in the morning.   Probiotic Product (ALIGN PO) Take 1 capsule by mouth in the morning.   sotalol (BETAPACE) 120 MG tablet TAKE 1 TABLET(120 MG) BY MOUTH TWICE DAILY    Zinc 50 MG CAPS Take 50 mg by mouth 2 (two) times daily.      Allergies:   Dextromethorphan; Versed [midazolam]; and Antihistamines, diphenhydramine-type   Social History   Socioeconomic History   Marital status: Married    Spouse name: Not on file   Number of children: 2   Years of education: Not on file   Highest education level: Not on file  Occupational History   Occupation: retired  Tobacco Use   Smoking status: Former    Packs/day: 2.00    Years: 20.00    Pack years: 40.00    Types: Cigarettes    Quit date: 07/06/1981    Years since quitting: 40.2   Smokeless tobacco: Never  Vaping Use   Vaping Use: Never used  Substance and Sexual Activity   Alcohol use: Yes    Alcohol/week: 0.0 standard drinks    Comment: 2 glasses per day   Drug use: No   Sexual activity: Not on file  Other Topics Concern   Not on file  Social History Narrative   Originally from [place]. Moved to [place] in 2012. Currently has no pets. No bird or hot tub exposure. 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 He did have exposure to Beneze, Acetone, Styrene, & Hydrocarbon early in his career.   Social Determinants of Health   Financial Resource Strain: Not on file  Food Insecurity: Not on file  Transportation Needs: Not on file  Physical Activity: Not on file  Stress: Not on file  Social Connections: Not on file     Family History: The patient's family history includes Arrhythmia in his brother and mother; Colon polyps in his father; Diabetes in his brother and father; Heart Problems in his brother; Heart attack in his brother; Heart failure in his mother; Hypertrophic cardiomyopathy in his brother and mother; Pneumonia in his father; Sudden death in his brother. There is no history of Lung disease.  ROS:   Please see the history of present illness.    All other systems reviewed and are negative.  EKGs/Labs/Other Studies Reviewed:    The following studies  were reviewed today: Prior ablation  09/03/2021 Remote interrogation 25% AF burden  September 10, 2021 in clinic device interrogation personally reviewed Longevity 4.3 years Lead parameter stable AF burden 27% No therapies delivered VT episodes appear to be A. fib with RVR based on far field EGM morphology.  EKG:  The ekg ordered today demonstrates atypical atrial flutter with a ventricular rate of 72 bpm.  Recent Labs: 07/24/2021: BUN 19; Creatinine, Ser 1.31; Hemoglobin 14.8; Magnesium 2.1; Platelets 166; Potassium 4.4; Sodium 138  Recent Lipid Panel No results found for: CHOL, TRIG, HDL, CHOLHDL, VLDL, LDLCALC, LDLDIRECT  Physical Exam:    VS:  BP 124/70    Pulse 72    Ht 5\' 10"  (1.778 m)    Wt 184 lb 6.4 oz (83.6  kg)    SpO2 98%    BMI 26.46 kg/m     Wt Readings from Last 3 Encounters:  09/10/21 184 lb 6.4 oz (83.6 kg)  08/04/21 181 lb (82.1 kg)  07/30/21 175 lb (79.4 kg)     GEN: Well nourished, well developed in no acute distress HEENT: Normal NECK: No JVD; No carotid bruits LYMPHATICS: No lymphadenopathy CARDIAC: Irregularly irregular, no murmurs, rubs, gallops RESPIRATORY:  Clear to auscultation without rales, wheezing or rhonchi  ABDOMEN: Soft, non-tender, non-distended MUSCULOSKELETAL: 1+ pitting bilateral lower extremity edema; No deformity  SKIN: Warm and dry NEUROLOGIC:  Alert and oriented x 3 PSYCHIATRIC:  Normal affect        ASSESSMENT:    1. PAF (paroxysmal atrial fibrillation) ([place])   2. VT (ventricular tachycardia) ([place])   3. Hypertrophic cardiomyopathy ([place])   4. ICD (implantable cardioverter-defibrillator) in place   5. Atrial fibrillation, unspecified type ([place])    PLAN:    In order of problems listed above:  #Persistent atrial fibrillation/flutter Symptomatic.  Recurrent after previous ablation during which left atrial flutters were described.  He should continue taking Eliquis for stroke prophylaxis.  I think a repeat ablation is  indicated to target the left atrial circuits and to recheck his pulmonary vein/posterior wall.  I discussed the repeat ablation procedure in detail with the patient during today's visit include the risk, recovery and likelihood of success.  We discussed the possibility of him having additional circuits after the ablation with recurrence of arrhythmia.  If this were to happen, would consider starting an antiarrhythmic drug.  We would need a CT scan prior to the redo ablation to reassess the pulmonary vein architecture.  he should continue taking sotalol 120 mg by mouth twice daily.   Risk, benefits, and alternatives to EP study and radiofrequency ablation for afib were also discussed in detail today. These risks include but are not limited to stroke, bleeding, vascular damage, tamponade, perforation, damage to the esophagus, lungs, and other structures, pulmonary vein stenosis, worsening renal function, and death. The patient understands these risk and wishes to proceed.  We will therefore proceed with catheter ablation at the next available time.  Carto, ICE, anesthesia are requested for the procedure.  Will also obtain CT PV protocol prior to the procedure to exclude LAA thrombus and further evaluate atrial anatomy.   #Hypertrophic cardiomyopathy, ventricular tachycardia, ICD in situ Device functioning appropriately.  No recent therapies. Rhythm control is indicated as above  #Probable sleep disordered breathing Itamr sleep study ordered  #Slight volume overload, acute on chronic diastolic heart failure I like him to restart his Lasix and potassium for the next 4 days.  He will then go to as needed dosing based on weight.  He will check his weight daily and take a Lasix dose if his weight increases by greater than 3 pounds on 2 consecutive days.   Planning for redo PVI/flutter ablation in early January.     Total time spent with patient today 45 minutes. This includes reviewing records,  evaluating the patient and coordinating care.   Medication Adjustments/Labs and Tests Ordered: Current medicines are reviewed at length with the patient today.  Concerns regarding medicines are outlined above.  Orders Placed This Encounter  Procedures   CT CARDIAC MORPH/PULM VEIN W/CM&W/O CA SCORE   CBC w/Diff   Basic Metabolic Panel (BMET)   EKG 12-Lead   Meds ordered this encounter  Medications   furosemide (LASIX) 20 MG tablet  Sig: Take 1 tablet by mouth daily until 11/16 then only as needed for swelling/weight gain    Dispense:  30 tablet    Refill:  11   potassium citrate (UROCIT-K) 10 MEQ (1080 MG) SR tablet    Sig: Take 1 tablet (10 mEq total) by mouth daily as needed (with lasix (swelling/weight gain)).    Dispense:  30 tablet    Refill:  11     Signed, Lars Mage, MD, [facility], [facility] 09/10/2021 10:58 AM    Electrophysiology [facility]

## 2021-09-10 ENCOUNTER — Encounter: Payer: Self-pay | Admitting: Cardiology

## 2021-09-10 ENCOUNTER — Telehealth: Payer: Self-pay

## 2021-09-10 ENCOUNTER — Other Ambulatory Visit: Payer: Self-pay

## 2021-09-10 ENCOUNTER — Ambulatory Visit (INDEPENDENT_AMBULATORY_CARE_PROVIDER_SITE_OTHER): Payer: Medicare Other | Admitting: Cardiology

## 2021-09-10 VITALS — BP 124/70 | HR 72 | Ht 70.0 in | Wt 184.4 lb

## 2021-09-10 DIAGNOSIS — Z95810 Presence of automatic (implantable) cardiac defibrillator: Secondary | ICD-10-CM | POA: Diagnosis not present

## 2021-09-10 DIAGNOSIS — R0683 Snoring: Secondary | ICD-10-CM

## 2021-09-10 DIAGNOSIS — I422 Other hypertrophic cardiomyopathy: Secondary | ICD-10-CM | POA: Diagnosis not present

## 2021-09-10 DIAGNOSIS — I472 Ventricular tachycardia, unspecified: Secondary | ICD-10-CM

## 2021-09-10 DIAGNOSIS — I4891 Unspecified atrial fibrillation: Secondary | ICD-10-CM | POA: Diagnosis not present

## 2021-09-10 DIAGNOSIS — I480 Paroxysmal atrial fibrillation: Secondary | ICD-10-CM | POA: Diagnosis not present

## 2021-09-10 LAB — CBC WITH DIFFERENTIAL/PLATELET
Basophils Absolute: 0.1 10*3/uL (ref 0.0–0.2)
Basos: 1 %
EOS (ABSOLUTE): 0.1 10*3/uL (ref 0.0–0.4)
Eos: 2 %
Hematocrit: 48.2 % (ref 37.5–51.0)
Hemoglobin: 15.9 g/dL (ref 13.0–17.7)
Immature Grans (Abs): 0 10*3/uL (ref 0.0–0.1)
Immature Granulocytes: 0 %
Lymphocytes Absolute: 1.6 10*3/uL (ref 0.7–3.1)
Lymphs: 19 %
MCH: 29.7 pg (ref 26.6–33.0)
MCHC: 33 g/dL (ref 31.5–35.7)
MCV: 90 fL (ref 79–97)
Monocytes Absolute: 0.7 10*3/uL (ref 0.1–0.9)
Monocytes: 8 %
Neutrophils Absolute: 6.1 10*3/uL (ref 1.4–7.0)
Neutrophils: 70 %
Platelets: 180 10*3/uL (ref 150–450)
RBC: 5.36 x10E6/uL (ref 4.14–5.80)
RDW: 12.9 % (ref 11.6–15.4)
WBC: 8.5 10*3/uL (ref 3.4–10.8)

## 2021-09-10 LAB — BASIC METABOLIC PANEL
BUN/Creatinine Ratio: 15 (ref 10–24)
BUN: 18 mg/dL (ref 8–27)
CO2: 26 mmol/L (ref 20–29)
Calcium: 9.6 mg/dL (ref 8.6–10.2)
Chloride: 100 mmol/L (ref 96–106)
Creatinine, Ser: 1.2 mg/dL (ref 0.76–1.27)
Glucose: 86 mg/dL (ref 70–99)
Potassium: 4.8 mmol/L (ref 3.5–5.2)
Sodium: 139 mmol/L (ref 134–144)
eGFR: 63 mL/min/{1.73_m2} (ref >59)

## 2021-09-10 MED ORDER — FUROSEMIDE 20 MG PO TABS: ORAL_TABLET | ORAL | 11 refills | Status: DC

## 2021-09-10 MED ORDER — POTASSIUM CITRATE ER 10 MEQ (1080 MG) PO TBCR: 10.0000 meq | EXTENDED_RELEASE_TABLET | Freq: Every day | ORAL | 11 refills | Status: DC | PRN

## 2021-09-10 NOTE — Patient Instructions (Addendum)
Medication Instructions:   Your physician has recommended you make the following change in your medication:    Take your lasix and potassium once a day for the next 4 days.   Then return to taking it as needed when you notice weight gain   Lab Work: You will get lab work today;  CBC and BMP If you have labs (blood work) drawn today and your tests are completely normal, you will receive your results only by: MyChart Message (if you have MyChart) OR A paper copy in the mail If you have any lab test that is abnormal or we need to change your treatment, we will call you to review the results.  Testing/Procedures: Your physician has requested that you have cardiac CT. Cardiac computed tomography (CT) is a painless test that uses an x-ray machine to take clear, detailed pictures of your heart.     Follow-Up: At [facility], you and your health needs are our priority.  As part of our continuing mission to provide you with exceptional heart care, we have created designated Provider Care Teams.  These Care Teams include your primary Cardiologist (physician) and Advanced Practice Providers (APPs -  Physician Assistants and Nurse Practitioners) who all work together to provide you with the care you need, when you need it.  Your next appointment:    SEE INSTRUCTION LETTER  Cardiac Ablation Cardiac ablation is a procedure to destroy, or ablate, a small amount of heart tissue in very specific places. The heart has many electrical connections. Sometimes these connections are abnormal and can cause the heart to beat very fast or irregularly. Ablating some of the areas that cause problems can improve the heart's rhythm or return it to normal. Ablation may be done for people who: Have Wolff-Parkinson-White syndrome. Have fast heart rhythms (tachycardia). Have taken medicines for an abnormal heart rhythm (arrhythmia) that were not effective or caused side effects. Have a high-risk heartbeat that may be  life-threatening. During the procedure, a small incision is made in the neck or the groin, and a long, thin tube (catheter) is inserted into the incision and moved to the heart. Small devices (electrodes) on the tip of the catheter will send out electrical currents. A type of X-ray (fluoroscopy) will be used to help guide the catheter and to provide images of the heart. Tell a health care provider about: Any allergies you have. All medicines you are taking, including vitamins, herbs, eye drops, creams, and over-the-counter medicines. Any problems you or family members have had with anesthetic medicines. Any blood disorders you have. Any surgeries you have had. Any medical conditions you have, such as kidney failure. Whether you are pregnant or may be pregnant. What are the risks? Generally, this is a safe procedure. However, problems may occur, including: Infection. Bruising and bleeding at the catheter insertion site. Bleeding into the chest, especially into the sac that surrounds the heart. This is a serious complication. Stroke or blood clots. Damage to nearby structures or organs. Allergic reaction to medicines or dyes. Need for a permanent pacemaker if the normal electrical system is damaged. A pacemaker is a small computer that sends electrical signals to the heart and helps your heart beat normally. The procedure not being fully effective. This may not be recognized until months later. Repeat ablation procedures are sometimes done. What happens before the procedure? Medicines Ask your health care provider about: Changing or stopping your regular medicines. This is especially important if you are taking diabetes medicines or  blood thinners. Taking medicines such as aspirin and ibuprofen. These medicines can thin your blood. Do not take these medicines unless your health care provider tells you to take them. Taking over-the-counter medicines, vitamins, herbs, and supplements. General  instructions Follow instructions from your health care provider about eating or drinking restrictions. Plan to have someone take you home from the hospital or clinic. If you will be going home right after the procedure, plan to have someone with you for 24 hours. Ask your health care provider what steps will be taken to prevent infection. What happens during the procedure?  An IV will be inserted into one of your veins. You will be given a medicine to help you relax (sedative). The skin on your neck or groin will be numbed. An incision will be made in your neck or your groin. A needle will be inserted through the incision and into a large vein in your neck or groin. A catheter will be inserted into the needle and moved to your heart. Dye may be injected through the catheter to help your surgeon see the area of the heart that needs treatment. Electrical currents will be sent from the catheter to ablate heart tissue in desired areas. There are three types of energy that may be used to do this: Heat (radiofrequency energy). Laser energy. Extreme cold (cryoablation). When the tissue has been ablated, the catheter will be removed. Pressure will be held on the insertion area to prevent a lot of bleeding. A bandage (dressing) will be placed over the insertion area. The exact procedure may vary among health care providers and hospitals. What happens after the procedure? Your blood pressure, heart rate, breathing rate, and blood oxygen level will be monitored until you leave the hospital or clinic. Your insertion area will be monitored for bleeding. You will need to lie still for a few hours to ensure that you do not bleed from the insertion area. Do not drive for 24 hours or as long as told by your health care provider. Summary Cardiac ablation is a procedure to destroy, or ablate, a small amount of heart tissue using an electrical current. This procedure can improve the heart rhythm or return it  to normal. Tell your health care provider about any medical conditions you may have and all medicines you are taking to treat them. This is a safe procedure, but problems may occur. Problems may include infection, bruising, damage to nearby organs or structures, or allergic reactions to medicines. Follow your health care provider's instructions about eating and drinking before the procedure. You may also be told to change or stop some of your medicines. After the procedure, do not drive for 24 hours or as long as told by your health care provider. This information is not intended to replace advice given to you by your health care provider. Make sure you discuss any questions you have with your health care provider. Document Revised: 07/10/2019 Document Reviewed: 07/10/2019 Elsevier Patient Education  [place].

## 2021-09-12 ENCOUNTER — Telehealth (HOSPITAL_COMMUNITY): Payer: Self-pay | Admitting: *Deleted

## 2021-09-12 NOTE — Telephone Encounter (Signed)
Reaching out to patient to offer assistance regarding upcoming cardiac imaging study; pt verbalizes understanding of appt date/time, parking situation and where to check in, pre-test NPO status and verified current allergies; name and call back number provided for further questions should they arise  Gordy Clement RN Navigator Cardiac Imaging [facility] [phone] office [phone] cell  Patient aware to arrive at 1:30pm for his 2pm scan.

## 2021-09-12 NOTE — Telephone Encounter (Signed)
°  Height:   5'10"    Weight:  184 pounds  Office [place]         Referring Provider:  Dr. Quentin Ore  STOP BANG RISK ASSESSMENT S (snore) Have you been told that you snore?     YES   T (tired) Are you often tired, fatigued, or sleepy during the day?   NO  O (obstruction) Do you stop breathing, choke, or gasp during sleep? YES   P (pressure) Do you have or are you being treated for high blood pressure? NO   B (BMI) Is your body index greater than 35 kg/m? NO   A (age) Are you 12 years old or older? YES   N (neck) Do you have a neck circumference greater than 16 inches?   NO   G (gender) Are you a male? YES   TOTAL STOP/BANG YES ANSWERS

## 2021-09-15 ENCOUNTER — Other Ambulatory Visit (HOSPITAL_COMMUNITY): Payer: Self-pay | Admitting: Nurse Practitioner

## 2021-09-15 ENCOUNTER — Encounter: Payer: Self-pay | Admitting: Cardiology

## 2021-09-15 DIAGNOSIS — M9902 Segmental and somatic dysfunction of thoracic region: Secondary | ICD-10-CM | POA: Diagnosis not present

## 2021-09-15 DIAGNOSIS — M5137 Other intervertebral disc degeneration, lumbosacral region: Secondary | ICD-10-CM | POA: Diagnosis not present

## 2021-09-15 DIAGNOSIS — R293 Abnormal posture: Secondary | ICD-10-CM | POA: Diagnosis not present

## 2021-09-15 DIAGNOSIS — M50222 Other cervical disc displacement at C5-C6 level: Secondary | ICD-10-CM | POA: Diagnosis not present

## 2021-09-15 DIAGNOSIS — M9903 Segmental and somatic dysfunction of lumbar region: Secondary | ICD-10-CM | POA: Diagnosis not present

## 2021-09-15 DIAGNOSIS — M791 Myalgia, unspecified site: Secondary | ICD-10-CM | POA: Diagnosis not present

## 2021-09-15 DIAGNOSIS — M9901 Segmental and somatic dysfunction of cervical region: Secondary | ICD-10-CM | POA: Diagnosis not present

## 2021-09-15 DIAGNOSIS — M542 Cervicalgia: Secondary | ICD-10-CM | POA: Diagnosis not present

## 2021-09-15 DIAGNOSIS — M5459 Other low back pain: Secondary | ICD-10-CM | POA: Diagnosis not present

## 2021-09-16 ENCOUNTER — Other Ambulatory Visit: Payer: Self-pay

## 2021-09-16 ENCOUNTER — Ambulatory Visit (HOSPITAL_COMMUNITY)
Admission: RE | Admit: 2021-09-16 | Discharge: 2021-09-16 | Disposition: A | Discharge status: Home / Self Care | Payer: Medicare Other | Source: Ambulatory Visit | Attending: Cardiology | Admitting: Cardiology

## 2021-09-16 DIAGNOSIS — I4891 Unspecified atrial fibrillation: Secondary | ICD-10-CM

## 2021-09-16 MED ORDER — METOPROLOL SUCCINATE ER 25 MG PO TB24: 25.0000 mg | ORAL_TABLET | Freq: Two times a day (BID) | ORAL | 3 refills | Status: DC

## 2021-09-16 MED ORDER — IOHEXOL 350 MG/ML SOLN
95.0000 mL | Freq: Once | INTRAVENOUS | Status: AC | PRN
  Administered 2021-09-16: 95 mL via INTRAVENOUS

## 2021-09-16 NOTE — Progress Notes (Signed)
Remote ICD transmission.   

## 2021-09-17 NOTE — Pre-Procedure Instructions (Signed)
Instructed patient on the following items: Arrival time 1000 Nothing to eat or drink after midnight No meds AM of procedure Responsible person to drive you home and stay with you for 24 hrs  Have you missed any doses of anti-coagulant Eliquis- hasn't missed any doses

## 2021-09-18 ENCOUNTER — Ambulatory Visit (HOSPITAL_COMMUNITY)
Admission: RE | Disposition: A | Discharge status: Home / Self Care | Payer: Medicare Other | Source: Ambulatory Visit | Attending: Cardiology

## 2021-09-18 ENCOUNTER — Ambulatory Visit (HOSPITAL_COMMUNITY)
Admission: RE | Admit: 2021-09-18 | Discharge: 2021-09-19 | Disposition: A | Discharge status: Home / Self Care | Payer: Medicare Other | Source: Ambulatory Visit | Attending: Cardiology | Admitting: Cardiology

## 2021-09-18 ENCOUNTER — Ambulatory Visit (HOSPITAL_COMMUNITY): Payer: Medicare Other | Admitting: Anesthesiology

## 2021-09-18 ENCOUNTER — Other Ambulatory Visit: Payer: Self-pay

## 2021-09-18 DIAGNOSIS — Z7901 Long term (current) use of anticoagulants: Secondary | ICD-10-CM | POA: Diagnosis not present

## 2021-09-18 DIAGNOSIS — I422 Other hypertrophic cardiomyopathy: Secondary | ICD-10-CM | POA: Diagnosis not present

## 2021-09-18 DIAGNOSIS — I483 Typical atrial flutter: Secondary | ICD-10-CM | POA: Diagnosis not present

## 2021-09-18 DIAGNOSIS — I4819 Other persistent atrial fibrillation: Secondary | ICD-10-CM | POA: Insufficient documentation

## 2021-09-18 DIAGNOSIS — I4891 Unspecified atrial fibrillation: Secondary | ICD-10-CM | POA: Diagnosis present

## 2021-09-18 DIAGNOSIS — Z95810 Presence of automatic (implantable) cardiac defibrillator: Secondary | ICD-10-CM | POA: Diagnosis not present

## 2021-09-18 DIAGNOSIS — I472 Ventricular tachycardia, unspecified: Secondary | ICD-10-CM | POA: Insufficient documentation

## 2021-09-18 DIAGNOSIS — I5033 Acute on chronic diastolic (congestive) heart failure: Secondary | ICD-10-CM | POA: Diagnosis not present

## 2021-09-18 DIAGNOSIS — K449 Diaphragmatic hernia without obstruction or gangrene: Secondary | ICD-10-CM | POA: Diagnosis not present

## 2021-09-18 DIAGNOSIS — I484 Atypical atrial flutter: Secondary | ICD-10-CM | POA: Diagnosis not present

## 2021-09-18 HISTORY — PX: ATRIAL FIBRILLATION ABLATION: EP1191

## 2021-09-18 LAB — POCT ACTIVATED CLOTTING TIME
Activated Clotting Time: 269 seconds
Activated Clotting Time: 305 seconds

## 2021-09-18 SURGERY — ATRIAL FIBRILLATION ABLATION
Anesthesia: General

## 2021-09-18 MED ORDER — ISOPROTERENOL HCL 0.2 MG/ML IJ SOLN
INTRAVENOUS | Status: DC | PRN
  Administered 2021-09-18: 3 ug/min via INTRAVENOUS

## 2021-09-18 MED ORDER — FENTANYL CITRATE (PF) 100 MCG/2ML IJ SOLN
INTRAMUSCULAR | Status: DC | PRN
  Administered 2021-09-18: 100 ug via INTRAVENOUS

## 2021-09-18 MED ORDER — ISOPROTERENOL HCL 0.2 MG/ML IJ SOLN
INTRAMUSCULAR | Status: AC
  Filled 2021-09-18: qty 5

## 2021-09-18 MED ORDER — SUGAMMADEX SODIUM 200 MG/2ML IV SOLN
INTRAVENOUS | Status: DC | PRN
  Administered 2021-09-18: 400 mg via INTRAVENOUS

## 2021-09-18 MED ORDER — SOTALOL HCL 120 MG PO TABS
120.0000 mg | ORAL_TABLET | Freq: Two times a day (BID) | ORAL | Status: DC
  Administered 2021-09-18 – 2021-09-19 (×2): 120 mg via ORAL
  Filled 2021-09-18 (×3): qty 1

## 2021-09-18 MED ORDER — HEPARIN SODIUM (PORCINE) 1000 UNIT/ML IJ SOLN
INTRAMUSCULAR | Status: DC | PRN
  Administered 2021-09-18: 1000 [IU] via INTRAVENOUS

## 2021-09-18 MED ORDER — PHENYLEPHRINE 40 MCG/ML (10ML) SYRINGE FOR IV PUSH (FOR BLOOD PRESSURE SUPPORT)
PREFILLED_SYRINGE | INTRAVENOUS | Status: DC | PRN
  Administered 2021-09-18: 80 ug via INTRAVENOUS

## 2021-09-18 MED ORDER — SODIUM CHLORIDE 0.9 % IV SOLN: INTRAVENOUS | Status: DC

## 2021-09-18 MED ORDER — HEPARIN (PORCINE) IN NACL 1000-0.9 UT/500ML-% IV SOLN
INTRAVENOUS | Status: AC
  Filled 2021-09-18: qty 500

## 2021-09-18 MED ORDER — ONDANSETRON HCL 4 MG/2ML IJ SOLN
INTRAMUSCULAR | Status: DC | PRN
  Administered 2021-09-18: 4 mg via INTRAVENOUS

## 2021-09-18 MED ORDER — ROCURONIUM BROMIDE 10 MG/ML (PF) SYRINGE
PREFILLED_SYRINGE | INTRAVENOUS | Status: DC | PRN
  Administered 2021-09-18 (×2): 50 mg via INTRAVENOUS
  Administered 2021-09-18: 100 mg via INTRAVENOUS

## 2021-09-18 MED ORDER — PROTAMINE SULFATE 10 MG/ML IV SOLN
INTRAVENOUS | Status: DC | PRN
  Administered 2021-09-18: 5 mg via INTRAVENOUS
  Administered 2021-09-18 (×3): 10 mg via INTRAVENOUS

## 2021-09-18 MED ORDER — LIDOCAINE 2% (20 MG/ML) 5 ML SYRINGE
INTRAMUSCULAR | Status: DC | PRN
  Administered 2021-09-18: 60 mg via INTRAVENOUS

## 2021-09-18 MED ORDER — FUROSEMIDE 20 MG PO TABS
20.0000 mg | ORAL_TABLET | Freq: Every morning | ORAL | Status: DC
  Administered 2021-09-18 – 2021-09-19 (×2): 20 mg via ORAL
  Filled 2021-09-18 (×2): qty 1

## 2021-09-18 MED ORDER — METOPROLOL SUCCINATE ER 25 MG PO TB24
25.0000 mg | ORAL_TABLET | Freq: Two times a day (BID) | ORAL | Status: DC
  Administered 2021-09-18 – 2021-09-19 (×2): 25 mg via ORAL
  Filled 2021-09-18 (×2): qty 1

## 2021-09-18 MED ORDER — HEPARIN (PORCINE) IN NACL 1000-0.9 UT/500ML-% IV SOLN
INTRAVENOUS | Status: DC | PRN
  Administered 2021-09-18 (×5): 500 mL

## 2021-09-18 MED ORDER — ACETAMINOPHEN 500 MG PO TABS
1000.0000 mg | ORAL_TABLET | Freq: Once | ORAL | Status: AC
  Filled 2021-09-18: qty 2

## 2021-09-18 MED ORDER — PHENYLEPHRINE HCL-NACL 20-0.9 MG/250ML-% IV SOLN
INTRAVENOUS | Status: DC | PRN
  Administered 2021-09-18: 30 ug/min via INTRAVENOUS

## 2021-09-18 MED ORDER — CEFAZOLIN SODIUM-DEXTROSE 2-4 GM/100ML-% IV SOLN
INTRAVENOUS | Status: AC
  Filled 2021-09-18: qty 100

## 2021-09-18 MED ORDER — HEPARIN SODIUM (PORCINE) 1000 UNIT/ML IJ SOLN
INTRAMUSCULAR | Status: DC | PRN
Start: 2021-09-18 — End: 2021-09-18
  Administered 2021-09-18: 5000 [IU] via INTRAVENOUS
  Administered 2021-09-18: 4000 [IU] via INTRAVENOUS
  Administered 2021-09-18: 12000 [IU] via INTRAVENOUS

## 2021-09-18 MED ORDER — PANTOPRAZOLE SODIUM 40 MG PO TBEC
40.0000 mg | DELAYED_RELEASE_TABLET | Freq: Every day | ORAL | Status: DC
  Administered 2021-09-18 – 2021-09-19 (×2): 40 mg via ORAL
  Filled 2021-09-18 (×2): qty 1

## 2021-09-18 MED ORDER — APIXABAN 5 MG PO TABS
5.0000 mg | ORAL_TABLET | Freq: Two times a day (BID) | ORAL | Status: DC
  Administered 2021-09-18 – 2021-09-19 (×2): 5 mg via ORAL
  Filled 2021-09-18 (×2): qty 1

## 2021-09-18 MED ORDER — ACETAMINOPHEN 500 MG PO TABS
ORAL_TABLET | ORAL | Status: AC
  Administered 2021-09-18: 1000 mg via ORAL
  Filled 2021-09-18: qty 2

## 2021-09-18 MED ORDER — ACETAMINOPHEN 325 MG PO TABS
650.0000 mg | ORAL_TABLET | ORAL | Status: DC | PRN
  Administered 2021-09-18: 650 mg via ORAL
  Filled 2021-09-18: qty 2

## 2021-09-18 MED ORDER — CEFAZOLIN SODIUM-DEXTROSE 2-3 GM-%(50ML) IV SOLR
INTRAVENOUS | Status: DC | PRN
  Administered 2021-09-18: 2 g via INTRAVENOUS

## 2021-09-18 MED ORDER — FENTANYL CITRATE (PF) 100 MCG/2ML IJ SOLN
INTRAMUSCULAR | Status: AC
  Filled 2021-09-18: qty 2

## 2021-09-18 MED ORDER — HEPARIN SODIUM (PORCINE) 1000 UNIT/ML IJ SOLN
INTRAMUSCULAR | Status: AC
  Filled 2021-09-18: qty 10

## 2021-09-18 MED ORDER — COLCHICINE 0.6 MG PO TABS
0.6000 mg | ORAL_TABLET | Freq: Two times a day (BID) | ORAL | Status: DC
  Administered 2021-09-18 – 2021-09-19 (×2): 0.6 mg via ORAL
  Filled 2021-09-18 (×2): qty 1

## 2021-09-18 MED ORDER — PROPOFOL 10 MG/ML IV BOLUS
INTRAVENOUS | Status: DC | PRN
  Administered 2021-09-18: 100 mg via INTRAVENOUS

## 2021-09-18 SURGICAL SUPPLY — 19 items
CATH 8FR REPROCESSED SOUNDSTAR (CATHETERS) ×2 IMPLANT
CATH 8FR SOUNDSTAR REPROCESSED (CATHETERS) IMPLANT
CATH OCTARAY 2.0 F 3-3-3-3-3 (CATHETERS) ×1 IMPLANT
CATH S CIRCA THERM PROBE 10F (CATHETERS) ×1 IMPLANT
CATH SMTCH THERMOCOOL SF DF (CATHETERS) ×1 IMPLANT
CATH WEB BI DIR CSDF CRV REPRO (CATHETERS) ×1 IMPLANT
CLOSURE PERCLOSE PROSTYLE (VASCULAR PRODUCTS) ×4 IMPLANT
COVER SWIFTLINK CONNECTOR (BAG) ×2 IMPLANT
MAT PREVALON FULL STRYKER (MISCELLANEOUS) ×1 IMPLANT
PACK EP LATEX FREE (CUSTOM PROCEDURE TRAY) ×1
PACK EP LF (CUSTOM PROCEDURE TRAY) ×1 IMPLANT
PAD DEFIB RADIO PHYSIO CONN (PAD) ×2 IMPLANT
PATCH CARTO3 (PAD) ×1 IMPLANT
SHEATH BAYLIS TRANSSEPTAL 98CM (NEEDLE) ×1 IMPLANT
SHEATH CARTO VIZIGO SM CVD (SHEATH) ×1 IMPLANT
SHEATH PINNACLE 8F 10CM (SHEATH) ×3 IMPLANT
SHEATH PINNACLE 9F 10CM (SHEATH) ×1 IMPLANT
SHEATH PROBE COVER 6X72 (BAG) ×1 IMPLANT
TUBING SMART ABLATE COOLFLOW (TUBING) ×1 IMPLANT

## 2021-09-18 NOTE — Transfer of Care (Signed)
Immediate Anesthesia Transfer of Care Note  Patient: Joseph Morrow  Procedure(s) Performed: ATRIAL FIBRILLATION ABLATION  Patient Location: PACU  Anesthesia Type:General  Level of Consciousness: drowsy and patient cooperative  Airway & Oxygen Therapy: Patient Spontanous Breathing and Patient connected to nasal cannula oxygen  Post-op Assessment: Report given to RN and Post -op Vital signs reviewed and stable  Post vital signs: Reviewed and stable  Last Vitals:  Vitals Value Taken Time  BP 113/78 09/18/21 1610  Temp 36.6 C 09/18/21 1607  Pulse 69 09/18/21 1614  Resp 15 09/18/21 1614  SpO2 100 % 09/18/21 1614  Vitals shown include unvalidated device data.  Last Pain:  Vitals:   09/18/21 1607  TempSrc: Temporal  PainSc: 0-No pain         Complications: No notable events documented.

## 2021-09-18 NOTE — Discharge Instructions (Signed)
Post procedure care instructions No driving for 4 days. No lifting over 5 lbs for 1 week. No vigorous or sexual activity for 1 week. You may return to work/your usual activities on 09/26/21. Keep procedure site clean & dry. If you notice increased pain, swelling, bleeding or pus, call/return!  You may shower after 24 hours, but no soaking in baths/hot tubs/pools for 1 week.    You have an appointment set up with the [facility].  Multiple studies have shown that being followed by a dedicated atrial fibrillation clinic in addition to the standard care you receive from your other physicians improves health. We believe that enrollment in the atrial fibrillation clinic will allow [place] to better care for you.   The phone number to the [facility] is [phone]. The clinic is staffed Monday through Friday from 8:30am to 5pm.  Parking Directions: The clinic is located in the Heart and Vascular Building connected to [facility]. 1)From [address] turn on to [address] and go to the 3rd entrance  (Heart and Vascular entrance) on the right. 2)Look to the right for Heart &Vascular Parking Garage. 3)A code for the entrance is required, for Feb is 1204.   4)Take the elevators to the 1st floor. Registration is in the room with the glass walls at the end of the hallway.  If you have any trouble parking or locating the clinic, please dont hesitate to call [phone].

## 2021-09-18 NOTE — Anesthesia Preprocedure Evaluation (Addendum)
Anesthesia Evaluation  Patient identified by MRN, date of birth, ID band Patient awake    Reviewed: Allergy & Precautions, H&P , NPO status , Patient's Chart, lab work & pertinent test results, reviewed documented beta blocker date and time   Airway Mallampati: II  TM Distance: >3 FB Neck ROM: Full    Dental no notable dental hx. (+) Teeth Intact, Dental Advisory Given   Pulmonary neg pulmonary ROS, former smoker,    Pulmonary exam normal breath sounds clear to auscultation       Cardiovascular + dysrhythmias Atrial Fibrillation  Rhythm:Regular Rate:Normal  Hypertrophic cardiomyopathy   Neuro/Psych negative neurological ROS  negative psych ROS   GI/Hepatic Neg liver ROS, hiatal hernia, GERD  Medicated,  Endo/Other  negative endocrine ROS  Renal/GU negative Renal ROS  negative genitourinary   Musculoskeletal   Abdominal   Peds  Hematology negative hematology ROS (+)   Anesthesia Other Findings   Reproductive/Obstetrics negative OB ROS                            Anesthesia Physical Anesthesia Plan  ASA: 3  Anesthesia Plan: General   Post-op Pain Management: Tylenol PO (pre-op)   Induction: Intravenous  PONV Risk Score and Plan: 3 and Ondansetron, Dexamethasone and Treatment may vary due to age or medical condition  Airway Management Planned: Oral ETT  Additional Equipment:   Intra-op Plan:   Post-operative Plan: Extubation in OR  Informed Consent: I have reviewed the patients History and Physical, chart, labs and discussed the procedure including the risks, benefits and alternatives for the proposed anesthesia with the patient or authorized representative who has indicated his/her understanding and acceptance.     Dental advisory given  Plan Discussed with: CRNA  Anesthesia Plan Comments:         Anesthesia Quick Evaluation

## 2021-09-18 NOTE — Progress Notes (Signed)
Left groin dressing saturated; removed; level 0; oozing; pressure applied. Rt groin level 0

## 2021-09-18 NOTE — Interval H&P Note (Signed)
History and Physical Interval Note:  09/18/2021 12:29 PM  Joseph Morrow  has presented today for surgery, with the diagnosis of afib.  The various methods of treatment have been discussed with the patient and family. After consideration of risks, benefits and other options for treatment, the patient has consented to  Procedure(s): ATRIAL FIBRILLATION ABLATION (N/A) as a surgical intervention.  The patient's history has been reviewed, patient examined, no change in status, stable for surgery.  I have reviewed the patient's chart and labs.  Questions were answered to the patient's satisfaction.     Tenise Stetler T Jolena Kittle

## 2021-09-18 NOTE — Anesthesia Procedure Notes (Signed)
Procedure Name: Intubation Date/Time: 09/18/2021 1:21 PM Performed by: Georgia Duff, CRNA Pre-anesthesia Checklist: Patient identified, Emergency Drugs available, Suction available and Patient being monitored Patient Re-evaluated:Patient Re-evaluated prior to induction Oxygen Delivery Method: Circle System Utilized Preoxygenation: Pre-oxygenation with 100% oxygen Induction Type: IV induction Ventilation: Mask ventilation without difficulty Laryngoscope Size: Mac and 4 Grade View: Grade I Tube type: Oral Tube size: 7.5 mm Number of attempts: 1 Airway Equipment and Method: Stylet and Oral airway Placement Confirmation: ETT inserted through vocal cords under direct vision, positive ETCO2 and breath sounds checked- equal and bilateral Secured at: 22 cm Tube secured with: Tape Dental Injury: Teeth and Oropharynx as per pre-operative assessment

## 2021-09-18 NOTE — Progress Notes (Signed)
Manual pressure held to left groin by Suella Broad, RN for 35 minutes. No oozing, no hematoma. Dressed w/gauze and tegaderm then pressure dressing. Bedrest restarts at [place]. Patient instructed. Level 0

## 2021-09-19 ENCOUNTER — Other Ambulatory Visit: Payer: Self-pay

## 2021-09-19 ENCOUNTER — Encounter (HOSPITAL_COMMUNITY): Payer: Self-pay | Admitting: Cardiology

## 2021-09-19 DIAGNOSIS — I472 Ventricular tachycardia, unspecified: Secondary | ICD-10-CM | POA: Diagnosis not present

## 2021-09-19 DIAGNOSIS — Z7901 Long term (current) use of anticoagulants: Secondary | ICD-10-CM | POA: Diagnosis not present

## 2021-09-19 DIAGNOSIS — I5033 Acute on chronic diastolic (congestive) heart failure: Secondary | ICD-10-CM | POA: Diagnosis not present

## 2021-09-19 DIAGNOSIS — I422 Other hypertrophic cardiomyopathy: Secondary | ICD-10-CM | POA: Diagnosis not present

## 2021-09-19 DIAGNOSIS — Z95810 Presence of automatic (implantable) cardiac defibrillator: Secondary | ICD-10-CM | POA: Diagnosis not present

## 2021-09-19 DIAGNOSIS — I4819 Other persistent atrial fibrillation: Secondary | ICD-10-CM | POA: Diagnosis not present

## 2021-09-19 MED ORDER — METOPROLOL SUCCINATE ER 50 MG PO TB24: 50.0000 mg | ORAL_TABLET | Freq: Two times a day (BID) | ORAL | 6 refills | Status: DC

## 2021-09-19 MED ORDER — PANTOPRAZOLE SODIUM 40 MG PO TBEC: 40.0000 mg | DELAYED_RELEASE_TABLET | Freq: Every day | ORAL | 0 refills | Status: DC

## 2021-09-19 MED ORDER — COLCHICINE 0.6 MG PO TABS: 0.6000 mg | ORAL_TABLET | Freq: Two times a day (BID) | ORAL | 0 refills | Status: DC

## 2021-09-19 MED ORDER — METOPROLOL SUCCINATE ER 50 MG PO TB24: 50.0000 mg | ORAL_TABLET | Freq: Two times a day (BID) | ORAL | Status: DC

## 2021-09-19 MED FILL — Cefazolin Sodium-Dextrose IV Solution 2 GM/100ML-4%: INTRAVENOUS | Qty: 100 | Status: AC

## 2021-09-19 NOTE — Telephone Encounter (Signed)
Pt's pharmacy is requesting a refill on pantoprazole. Would Dr. Lambert like to refill this medication? Please address 

## 2021-09-19 NOTE — Anesthesia Postprocedure Evaluation (Signed)
Anesthesia Post Note  Patient: Joseph Morrow  Procedure(s) Performed: ATRIAL FIBRILLATION ABLATION     Patient location during evaluation: Cath Lab Anesthesia Type: General Level of consciousness: awake and alert Pain management: pain level controlled Vital Signs Assessment: post-procedure vital signs reviewed and stable Respiratory status: spontaneous breathing, nonlabored ventilation and respiratory function stable Cardiovascular status: blood pressure returned to baseline and stable Postop Assessment: no apparent nausea or vomiting Anesthetic complications: no   No notable events documented.  Last Vitals:  Vitals:   09/19/21 0806 09/19/21 1119  BP:  (!) 123/96  Pulse: 67 74  Resp: 19   Temp: 36.6 C   SpO2: 96%     Last Pain:  Vitals:   09/19/21 0806  TempSrc: Oral  PainSc: 0-No pain                 Geanine Vandekamp,W. EDMOND

## 2021-09-19 NOTE — Discharge Summary (Signed)
ELECTROPHYSIOLOGY PROCEDURE DISCHARGE SUMMARY    Patient ID: Joseph Morrow,  MRN: [id number], DOB/AGE: Jan 13, 1946 76 y.o.  Admit date: 09/18/2021 Discharge date: 09/19/2021  Primary Care Physician: Ginger Organ., MD  Electrophysiologist: Dr. Quentin Ore  Primary Discharge Diagnosis:  paroxysmal Atrial fibrillation      CHA2DS2Vasc is 2 on Eliquis  Secondary Discharge Diagnosis:  HCM 2.   VT 3.   ICD  Procedures This Admission:  1.  Electrophysiology study and radiofrequency catheter ablation on by Dr Quentin Ore   This study demonstrated  CONCLUSIONS: 1. Pulmonary vein isolation (wide antral circumferential lesion set) with ablation required to reisolate the left pulmonary veins 2. Posterior wall ablation and isolation  3. Ablation of atypical, perimitral atrial flutter 4. Ablation of typical atrial flutter 5. Intracardiac echocardiography reveals trivial pericardial effusion 6. No early apparent complications. 7. Add Colchicine 0.6mg  PO BID x 5 days 8. Protonix 40mg  PO BID x 45 days.    Brief HPI: Joseph Morrow is a 76 y.o. male with a history of paroxysmal atrial fibrillation, prior ablation, on sotalol.  Risks, benefits, and alternatives to catheter ablation of atrial fibrillation were reviewed with the patient who wished to proceed.  The patient underwent cardiac CT prior to the procedure which noted no LAA thrombus.    Hospital Course:  The patient was admitted and underwent EPS/RFCA of atrial fibrillation with details as outlined above.  They were monitored on telemetry overnight which demonstrated SR, brief PAT.  R gorin required re-hold for oozing on the R side, both groins are soft, nontender, no hematoma, R has area of ecchymosis. The patient  feels well today, denies CP, SOB, site discomfort.  He was examined by Dr. Quentin Ore and considered to be stable for discharge.  Wound care and restrictions were reviewed with the patient.  The patient will be seen back by  the Afib clinic in 4 weeks and Dr Quentin Ore in 12 weeks for post ablation follow up.    Physical Exam: Vitals:   09/19/21 0005 09/19/21 0506 09/19/21 0736 09/19/21 0806  BP: 104/74 119/85 112/82   Pulse: 74 69  67  Resp: 19 19  19   Temp: 98.4 F (36.9 C) 98.4 F (36.9 C)  97.9 F (36.6 C)  TempSrc: Oral Oral  Oral  SpO2: 96% 96%  96%  Weight:      Height:        GEN- The patient is well appearing, alert and oriented x 3 today.   HEENT: normocephalic, atraumatic; sclera clear, conjunctiva pink; hearing intact; oropharynx clear; neck supple  Lungs- CTA b/l, normal work of breathing.  No wheezes, rales, rhonchi Heart- RRR, no murmurs, rubs or gallops  GI- soft, non-tender, non-distended, bowel sounds present  Extremities- no clubbing, cyanosis, or edema; b/l broin without hematoma/bruit, L side with ecchymosis, bot are non tender MS- no significant deformity or atrophy Skin- warm and dry, no rash or lesion Psych- euthymic mood, full affect Neuro- strength and sensation are intact   Labs:   Lab Results  Component Value Date   WBC 8.5 09/10/2021   HGB 15.9 09/10/2021   HCT 48.2 09/10/2021   MCV 90 09/10/2021   PLT 180 09/10/2021   No results for input(s): NA, K, CL, CO2, BUN, CREATININE, CALCIUM, PROT, BILITOT, ALKPHOS, ALT, AST, GLUCOSE in the last 168 hours.  Invalid input(s): LABALBU   Discharge Medications:  Allergies as of 09/19/2021       Reactions   Dextromethorphan Other (See Comments)  nervous   Versed [midazolam] Other (See Comments)   "hallucinations, headache"   Antihistamines, Diphenhydramine-type Anxiety        Medication List     TAKE these medications    acetaminophen 500 MG tablet Commonly known as: TYLENOL Take 500-1,000 mg by mouth every 6 (six) hours as needed (pain.).   ALIGN PO Take 1 capsule by mouth in the morning.   apixaban 5 MG Tabs tablet Commonly known as: Eliquis Take 1 tablet (5 mg total) by mouth 2 (two) times daily.    calcium carbonate 1500 (600 Ca) MG Tabs tablet Commonly known as: OSCAL Take 600 mg by mouth in the morning and at bedtime.   calcium carbonate 750 MG chewable tablet Commonly known as: TUMS EX Chew 1 tablet by mouth 3 (three) times daily as needed for heartburn.   cholecalciferol 25 MCG (1000 UNIT) tablet Commonly known as: VITAMIN D Take 1,000 Units by mouth in the morning and at bedtime.   Co Q 10 100 MG Caps Take 100 mg by mouth in the morning.   colchicine 0.6 MG tablet Take 1 tablet (0.6 mg total) by mouth 2 (two) times daily.   COSAMIN DS PO Take 1 tablet by mouth in the morning.   diltiazem 30 MG tablet Commonly known as: Cardizem Cardizem 30mg  -- take 1 tablet every 4 hours AS NEEDED for heart rate >100   furosemide 20 MG tablet Commonly known as: Lasix Take 1 tablet by mouth daily until 11/16 then only as needed for swelling/weight gain What changed:  how much to take how to take this when to take this additional instructions   metoprolol succinate 50 MG 24 hr tablet Commonly known as: TOPROL-XL Take 1 tablet (50 mg total) by mouth 2 (two) times daily. Take with or immediately following a meal. What changed:  medication strength how much to take when to take this additional instructions Another medication with the same name was removed. Continue taking this medication, and follow the directions you see here.   omeprazole 20 MG tablet Commonly known as: PRILOSEC OTC Take 10 mg by mouth in the morning. Notes to patient: Do not take this medicine while you are taking the pantoprazole.  Resume after the prescription is completed   pantoprazole 40 MG tablet Commonly known as: PROTONIX Take 1 tablet (40 mg total) by mouth daily. Start taking on: September 20, 2021   potassium citrate 10 MEQ (1080 MG) SR tablet Commonly known as: UROCIT-K Take 1 tablet (10 mEq total) by mouth daily as needed (with lasix (swelling/weight gain)). What changed: when to take  this   sotalol 120 MG tablet Commonly known as: BETAPACE TAKE 1 TABLET(120 MG) BY MOUTH TWICE DAILY   Zinc 50 MG Caps Take 50 mg by mouth 2 (two) times daily.        Disposition: Home Discharge Instructions     Diet - low sodium heart healthy   Complete by: As directed    Increase activity slowly   Complete by: As directed         Duration of Discharge Encounter: Greater than 30 minutes including physician time.  Venetia Night, PA-C 09/19/2021 9:50 AM

## 2021-09-22 ENCOUNTER — Encounter (HOSPITAL_COMMUNITY): Payer: Self-pay | Admitting: Cardiology

## 2021-09-24 ENCOUNTER — Encounter: Payer: Self-pay | Admitting: Cardiology

## 2021-09-24 ENCOUNTER — Encounter (INDEPENDENT_AMBULATORY_CARE_PROVIDER_SITE_OTHER): Payer: Medicare Other | Admitting: Cardiology

## 2021-09-24 DIAGNOSIS — G4733 Obstructive sleep apnea (adult) (pediatric): Secondary | ICD-10-CM

## 2021-09-25 NOTE — Procedures (Signed)
° °  Sleep Study Report  Patient Information Study Date: 09/24/21 Patient Name: Joseph Morrow Patient ID: [id number] Birth Date: 07-Aug-2046 Age: 76 Gender:Male BMI: 24.9 (W=174 lb, H=5' 10'') Referring Physician: Lars Mage, MD  TEST DESCRIPTION: Home sleep apnea testing was completed using the WatchPat, a Type 1 device, utilizing peripheral arterial tonometry (PAT), chest movement, actigraphy, pulse oximetry, pulse rate, body position and snore. AHI was calculated with apnea and hypopnea using valid sleep time as the denominator. RDI includes apneas, hypopneas, and RERAs. The data acquired and the scoring of sleep and all associated events were performed in accordance with the recommended standards and specifications as outlined in the AASM Manual for the Scoring of Sleep and Associated Events 2.2.0 (2015).  FINDINGS: 1. Mild Obstructive Sleep Apnea with AHI 6.9/hr. 2. No Central Sleep Apnea with pAHIc 0.3/hr. 3. Oxygen desaturations as low as 89%. 4. Minimal snoring was present. O2 sats were < 88% for 0 min. 5. Total sleep time was 6 hrs and 15 min. 6. 23.7% of total sleep time was spent in REM sleep. 7. Normal sleep onset latency at 18 min. 8. Shortened REM sleep onset latency at 54 min. 9. Total awakenings were 7.  DIAGNOSIS: Mild Obstructive Sleep Apnea (G47.33)  RECOMMENDATIONS: 1. Clinical correlation of these findings is necessary. The decision to treat obstructive sleep apnea (OSA) is usually based on the presence of apnea symptoms or the presence of associated medical conditions such as Hypertension, Congestive Heart Failure, Atrial Fibrillation or Obesity. The most common symptoms of OSA are snoring, gasping for breath while sleeping, daytime sleepiness and fatigue.  2. Initiating apnea therapy is recommended given the presence of symptoms and/or associated conditions. Recommend proceeding with one of the following:   a. Auto-CPAP therapy with a pressure  range of 5-20cm H2O.   b. An oral appliance (OA) that can be obtained from certain dentists with expertise in sleep medicine. These are primarily of use in non-obese patients with mild and moderate disease.   c. An ENT consultation which may be useful to look for specific causes of obstruction and possible treatment options.   d. If patient is intolerant to PAP therapy, consider referral to ENT for evaluation for hypoglossal nerve stimulator.  3. Close follow-up is necessary to ensure success with CPAP or oral appliance therapy for maximum benefit .  4. A follow-up oximetry study on CPAP is recommended to assess the adequacy of therapy and determine the need for supplemental oxygen or the potential need for Bi-level therapy. An arterial blood gas to determine the adequacy of baseline ventilation and oxygenation should also be considered.  5. Healthy sleep recommendations include: adequate nightly sleep (normal 7-9 hrs/night), avoidance of caffeine after noon and alcohol near bedtime, and maintaining a sleep environment that is cool, dark and quiet.  6. Weight loss for overweight patients is recommended. Even modest amounts of weight loss can significantly improve the severity of sleep apnea.  7. Snoring recommendations include: weight loss where appropriate, side sleeping, and avoidance of alcohol before bed. 8. Operation of motor vehicle should be avoided when sleepy.  Signature: Electronically Signed: 09/26/21 Fransico Him, MD; [facility]; [place], American Board of Sleep Medicine

## 2021-09-29 ENCOUNTER — Ambulatory Visit: Payer: Medicare Other

## 2021-09-29 DIAGNOSIS — R0683 Snoring: Secondary | ICD-10-CM

## 2021-10-05 ENCOUNTER — Encounter: Payer: Self-pay | Admitting: Cardiology

## 2021-10-10 ENCOUNTER — Emergency Department (HOSPITAL_COMMUNITY)
Admission: EM | Admit: 2021-10-10 | Discharge: 2021-10-10 | Disposition: A | Discharge status: Home / Self Care | Payer: Medicare Other | Attending: Emergency Medicine | Admitting: Emergency Medicine

## 2021-10-10 ENCOUNTER — Other Ambulatory Visit: Payer: Self-pay

## 2021-10-10 ENCOUNTER — Emergency Department (HOSPITAL_COMMUNITY): Payer: Medicare Other

## 2021-10-10 ENCOUNTER — Encounter (HOSPITAL_COMMUNITY): Payer: Self-pay | Admitting: *Deleted

## 2021-10-10 DIAGNOSIS — I4891 Unspecified atrial fibrillation: Secondary | ICD-10-CM | POA: Insufficient documentation

## 2021-10-10 DIAGNOSIS — R091 Pleurisy: Secondary | ICD-10-CM | POA: Insufficient documentation

## 2021-10-10 DIAGNOSIS — M542 Cervicalgia: Secondary | ICD-10-CM | POA: Diagnosis not present

## 2021-10-10 DIAGNOSIS — Z79899 Other long term (current) drug therapy: Secondary | ICD-10-CM | POA: Insufficient documentation

## 2021-10-10 DIAGNOSIS — R0602 Shortness of breath: Secondary | ICD-10-CM | POA: Diagnosis not present

## 2021-10-10 DIAGNOSIS — R070 Pain in throat: Secondary | ICD-10-CM | POA: Diagnosis not present

## 2021-10-10 DIAGNOSIS — M4322 Fusion of spine, cervical region: Secondary | ICD-10-CM | POA: Diagnosis not present

## 2021-10-10 DIAGNOSIS — J029 Acute pharyngitis, unspecified: Secondary | ICD-10-CM | POA: Diagnosis not present

## 2021-10-10 LAB — BASIC METABOLIC PANEL
Anion gap: 8 (ref 5–15)
BUN: 12 mg/dL (ref 8–23)
CO2: 27 mmol/L (ref 22–32)
Calcium: 9 mg/dL (ref 8.9–10.3)
Chloride: 102 mmol/L (ref 98–111)
Creatinine, Ser: 1.1 mg/dL (ref 0.61–1.24)
GFR, Estimated: >60 mL/min (ref >60)
Glucose, Bld: 114 mg/dL — ABNORMAL HIGH (ref 70–99)
Potassium: 3.9 mmol/L (ref 3.5–5.1)
Sodium: 137 mmol/L (ref 135–145)

## 2021-10-10 LAB — TROPONIN I (HIGH SENSITIVITY)
Troponin I (High Sensitivity): 15 ng/L (ref <18)
Troponin I (High Sensitivity): 12 ng/L (ref <18)

## 2021-10-10 LAB — CBC WITH DIFFERENTIAL/PLATELET
Abs Immature Granulocytes: 0.06 10*3/uL (ref 0.00–0.07)
Basophils Absolute: 0 10*3/uL (ref 0.0–0.1)
Basophils Relative: 0 %
Eosinophils Absolute: 0.1 10*3/uL (ref 0.0–0.5)
Eosinophils Relative: 1 %
HCT: 43.6 % (ref 39.0–52.0)
Hemoglobin: 14.8 g/dL (ref 13.0–17.0)
Immature Granulocytes: 1 %
Lymphocytes Relative: 12 %
Lymphs Abs: 1.1 10*3/uL (ref 0.7–4.0)
MCH: 29.9 pg (ref 26.0–34.0)
MCHC: 33.9 g/dL (ref 30.0–36.0)
MCV: 88.1 fL (ref 80.0–100.0)
Monocytes Absolute: 1 10*3/uL (ref 0.1–1.0)
Monocytes Relative: 11 %
Neutro Abs: 6.9 10*3/uL (ref 1.7–7.7)
Neutrophils Relative %: 75 %
Platelets: 168 10*3/uL (ref 150–400)
RBC: 4.95 MIL/uL (ref 4.22–5.81)
RDW: 13.4 % (ref 11.5–15.5)
WBC: 9.1 10*3/uL (ref 4.0–10.5)
nRBC: 0 % (ref 0.0–0.2)

## 2021-10-10 LAB — PROTIME-INR
INR: 1.2 (ref 0.8–1.2)
Prothrombin Time: 14.9 seconds (ref 11.4–15.2)

## 2021-10-10 LAB — GROUP A STREP BY PCR: Group A Strep by PCR: NOT DETECTED

## 2021-10-10 LAB — TSH: TSH: 0.976 u[IU]/mL (ref 0.350–4.500)

## 2021-10-10 MED ORDER — ACETAMINOPHEN 325 MG PO TABS
650.0000 mg | ORAL_TABLET | Freq: Once | ORAL | Status: AC
  Administered 2021-10-10: 650 mg via ORAL
  Filled 2021-10-10: qty 2

## 2021-10-10 NOTE — ED Provider Notes (Signed)
[facility] EMERGENCY DEPARTMENT Provider Note   CSN: [id number] Arrival date & time: 10/10/21  4098     History  Chief Complaint  Patient presents with   Shortness of Breath    Joseph Morrow is a 76 y.o. male.   Shortness of Breath Associated symptoms: no fever   History was obtained from the patient and his wife. Patient has history of hypertrophic cardiomyopathy, septal defect, ventricular tachycardia status post ICD, paroxysmal A. fib who presents with complaints of sore throat.  Patient states he started noticing the symptoms this morning.  He has a soreness in his throat around his Adam's apple.  He feels like when he takes a deep breath it goes into his chest and is uncomfortable.  Feels like it is affecting his ability to take a deep breath and is restricting his airway.  He is not having any fevers.  He has not been coughing.  He denies any fevers or chills.  Records reviewed.  Patient does have history of atrial fibrillation and had an ablation procedure on January 5.  Home Medications Prior to Admission medications   Medication Sig Start Date End Date Taking? Authorizing Provider  acetaminophen (TYLENOL) 500 MG tablet Take 500-1,000 mg by mouth every 6 (six) hours as needed (pain.).    [provider]  apixaban (ELIQUIS) 5 MG TABS tablet Take 1 tablet (5 mg total) by mouth 2 (two) times daily. 07/28/21   Allred, Jeneen Rinks, MD  calcium carbonate (OSCAL) 1500 (600 Ca) MG TABS tablet Take 600 mg by mouth in the morning and at bedtime.    [provider]  calcium carbonate (TUMS EX) 750 MG chewable tablet Chew 1 tablet by mouth 3 (three) times daily as needed for heartburn.    [provider]  cholecalciferol (VITAMIN D) 25 MCG (1000 UNIT) tablet Take 1,000 Units by mouth in the morning and at bedtime.    [provider]  Coenzyme Q10 (CO Q 10) 100 MG CAPS Take 100 mg by mouth in the morning.    [provider]   colchicine 0.6 MG tablet Take 1 tablet (0.6 mg total) by mouth 2 (two) times daily. 09/19/21   Baldwin Jamaica, PA-C  diltiazem (CARDIZEM) 30 MG tablet Cardizem 30mg  -- take 1 tablet every 4 hours AS NEEDED for heart rate >100 Patient not taking: Reported on 09/12/2021 07/24/21   Sherran Needs, NP  furosemide (LASIX) 20 MG tablet Take 1 tablet by mouth daily until 11/16 then only as needed for swelling/weight gain Patient taking differently: Take 20 mg by mouth in the morning. 09/10/21   Vickie Epley, MD  Glucosamine-Chondroitin (COSAMIN DS PO) Take 1 tablet by mouth in the morning.    [provider]  metoprolol succinate (TOPROL-XL) 50 MG 24 hr tablet Take 1 tablet (50 mg total) by mouth 2 (two) times daily. Take with or immediately following a meal. 09/19/21   Baldwin Jamaica, PA-C  omeprazole (PRILOSEC OTC) 20 MG tablet Take 10 mg by mouth in the morning.    [provider]  pantoprazole (PROTONIX) 40 MG tablet Take 1 tablet (40 mg total) by mouth daily. Patient will take for 45 days post ablation then stop 09/20/21   Vickie Epley, MD  potassium citrate (UROCIT-K) 10 MEQ (1080 MG) SR tablet Take 1 tablet (10 mEq total) by mouth daily as needed (with lasix (swelling/weight gain)). Patient taking differently: Take 10 mEq by mouth in the morning. 09/10/21  Vickie Epley, MD  Probiotic Product (ALIGN PO) Take 1 capsule by mouth in the morning.    [provider]  sotalol (BETAPACE) 120 MG tablet TAKE 1 TABLET(120 MG) BY MOUTH TWICE DAILY 06/23/21   Sherran Needs, NP  Zinc 50 MG CAPS Take 50 mg by mouth 2 (two) times daily.     [provider]      Allergies    Dextromethorphan; Versed [midazolam]; and Antihistamines, diphenhydramine-type    Review of Systems   Review of Systems  Constitutional:  Negative for fever.  Respiratory:  Positive for shortness of breath.   All other systems reviewed and are negative.  Physical Exam Updated  Vital Signs BP 104/62    Pulse 65    Temp 97.7 F (36.5 C) (Oral)    Resp 19    Ht 1.778 m (5\' 10" )    Wt 78.5 kg    SpO2 98%    BMI 24.82 kg/m  Physical Exam Vitals and nursing note reviewed.  Constitutional:      General: He is not in acute distress.    Appearance: He is well-developed.  HENT:     Head: Normocephalic and atraumatic.     Right Ear: External ear normal.     Left Ear: External ear normal.     Mouth/Throat:     Mouth: Mucous membranes are moist.     Pharynx: Oropharynx is clear. No pharyngeal swelling or oropharyngeal exudate.  Eyes:     General: No scleral icterus.       Right eye: No discharge.        Left eye: No discharge.     Conjunctiva/sclera: Conjunctivae normal.  Neck:     Trachea: No tracheal deviation.     Comments: Mild tenderness palpation around the trachea, no thyromegaly, no edema or induration, speech is normal without hoarseness, no stridor or increased salivation Cardiovascular:     Rate and Rhythm: Normal rate and regular rhythm.  Pulmonary:     Effort: Pulmonary effort is normal. No respiratory distress.     Breath sounds: Normal breath sounds. No stridor. No wheezing or rales.  Abdominal:     General: Bowel sounds are normal. There is no distension.     Palpations: Abdomen is soft.     Tenderness: There is no abdominal tenderness. There is no guarding or rebound.  Musculoskeletal:        General: No tenderness or deformity.     Cervical back: Neck supple.  Skin:    General: Skin is warm and dry.     Findings: No rash.  Neurological:     General: No focal deficit present.     Mental Status: He is alert.     Cranial Nerves: No cranial nerve deficit (no facial droop, extraocular movements intact, no slurred speech).     Sensory: No sensory deficit.     Motor: No abnormal muscle tone or seizure activity.     Coordination: Coordination normal.  Psychiatric:        Mood and Affect: Mood normal.    ED Results / Procedures / Treatments    Labs (all labs ordered are listed, but only abnormal results are displayed) Labs Reviewed  BASIC METABOLIC PANEL - Abnormal; Notable for the following components:      Result Value   Glucose, Bld 114 (*)    All other components within normal limits  GROUP A STREP BY PCR  CBC WITH DIFFERENTIAL/PLATELET  PROTIME-INR  TSH  TROPONIN I (HIGH SENSITIVITY)  TROPONIN I (HIGH SENSITIVITY)    EKG EKG Interpretation  Date/Time:  Friday October 10 2021 05:21:36 EST Ventricular Rate:  74 PR Interval:  248 QRS Duration: 64 QT Interval:  384 QTC Calculation: 426 R Axis:   100 Text Interpretation: Sinus rhythm with 1st degree A-V block Lateral infarct , age undetermined T wave abnormality, consider inferior ischemia Abnormal ECG When compared with ECG of 04-Aug-2021 15:40, t wave changes noted on prior ECG, atrial flutter resolved Confirmed by Dorie Rank [phone]) on 10/10/2021 7:06:46 AM  Radiology DG Neck Soft Tissue  Result Date: 10/10/2021 CLINICAL DATA:  Throat tightness. EXAM: NECK SOFT TISSUES - 1+ VIEW COMPARISON:  None. FINDINGS: There is no evidence of retropharyngeal soft tissue swelling or epiglottic enlargement. The cervical airway is unremarkable and no radio-opaque foreign body identified. 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 Advanced C6-7 disc degeneration. IMPRESSION: Negative soft tissues of the neck. Electronically Signed   By: Jorje Guild M.D.   On: 10/10/2021 06:12   DG Chest 2 View  Result Date: 10/10/2021 CLINICAL DATA:  Throat tightness. EXAM: CHEST - 2 VIEW COMPARISON:  03/25/2017 chest CT FINDINGS: Normal heart size and mediastinal contours. Dual-chamber pacer/ICD Leads from the left are in unremarkable position. No acute infiltrate or edema. No effusion or pneumothorax. No acute osseous findings. IMPRESSION: No active cardiopulmonary disease. Electronically Signed   By: Jorje Guild M.D.   On: 10/10/2021 06:13    Procedures Procedures    Medications Ordered  in ED Medications  acetaminophen (TYLENOL) tablet 650 mg (650 mg Oral Given 10/10/21 0809)    ED Course/ Medical Decision Making/ A&P Clinical Course as of 10/10/21 0920  Fri Oct 10, 2021  0846 CBC with Differential Normal [JK]  9767 Basic metabolic panel(!) Normal [JK]  0846 Protime-INR Normal [JK]  0846 Troponin I (High Sensitivity) Serial troponins normal [JK]  0855 DG Chest 2 View Chest x-ray and neck x-ray images and radiology report reviewed.  No acute abnormalities noted [JK]  0905 Strep test negative.  Thyroid test negative [JK]    Clinical Course User Index [JK] Dorie Rank, MD   HEAR Score: 3                       Medical Decision Making Amount and/or Complexity of Data Reviewed Labs: ordered. Decision-making details documented in ED Course. Radiology:  Decision-making details documented in ED Course.  Risk OTC drugs.   Patient presented with complaints of pleuritic type chest discomfort and a sore throat located primarily in the anterior aspect of his neck.  Patient did have some mild tenderness around the trachea.  No obvious swelling or mass.  Symptoms not suggestive of cardiac etiology, heart score low risk and ED work-up is reassuring.  Chest x-ray without signs of pneumonia or pneumothorax.  Neck x-ray does not show any signs of soft tissue swelling.  I doubt tracheitis Or thyroiditis based on his laboratory testing and evaluation.  Strep test is negative.  On exam the patient does not have any difficulty swallowing.   Overall ED work-up is reassuring.  Evaluation and diagnostic testing in the emergency department does not suggest an emergent condition requiring admission or immediate intervention beyond what has been performed at this time.  The patient is safe for discharge and has been instructed to return immediately for worsening symptoms, change in symptoms or any other concerns.         Final Clinical Impression(s) / ED  Diagnoses Final diagnoses:   Anterior neck pain  Pleurisy    Rx / DC Orders ED Discharge Orders     None         Dorie Rank, MD 10/10/21 [phone]

## 2021-10-10 NOTE — ED Notes (Signed)
ED Provider at bedside. 

## 2021-10-10 NOTE — ED Notes (Signed)
Patient transported to X-ray 

## 2021-10-10 NOTE — ED Provider Triage Note (Signed)
Emergency Medicine Provider Triage Evaluation Note  Joseph Morrow , a 76 y.o. male  was evaluated in triage.  Pt complains of sensation of "tightness" in his throat and upper chest. Symptoms woke patient from sleep and remained constant. Has worsening symptoms when laying supine. Has not had issues tolerating secretions. No lip or tongue swelling, lightheadedness, syncope, voice muffling, syncope/near syncope, recent fevers. No medications taken PTA. Is chronically anticoagulated on Eliquis. Hx of ablation for Afib 3 weeks ago. Denies hx of anaphylaxis. Does not take an ACE-I.  Review of Systems  Positive: As above Negative: As above  Physical Exam  BP 116/77 (BP Location: Right Arm)    Pulse 75    Temp 97.7 F (36.5 C) (Oral)    Resp 17    Ht 5\' 10"  (1.778 m)    Wt 78.5 kg    SpO2 97%    BMI 24.82 kg/m  Gen:   Awake, no distress   Resp:  Normal effort. Chest expansion symmetric. Lungs CTAB. MSK:   Moves extremities without difficulty  Other:  No stridor appreciated on exam. No angioedema to oropharynx, tongue, lips. No voice muffling.  Medical Decision Making  Medically screening exam initiated at 5:53 AM.  Appropriate orders placed.  Jacarius Handel was informed that the remainder of the evaluation will be completed by another provider, this initial triage assessment does not replace that evaluation, and the importance of remaining in the ED until their evaluation is complete.  Chest tightness - plan for labs, imaging, EKG.    Antonietta Breach, PA-C 10/10/21 [phone]

## 2021-10-10 NOTE — ED Triage Notes (Signed)
Pt c/o pain in the upper chest and throat when he takes a deep breath. Says he feels like he is having a hard time taking a deep breath because he feels like his "throat is closing". Woke him up from his sleep around 0330. Took a tylenol around 0400. Lung sounds are clear, no oral swelling. Pt wife says he voices "sounds higher that it normally would"

## 2021-10-10 NOTE — Discharge Instructions (Signed)
Take Tylenol to help with pain and discomfort.  Follow-up with your doctor to be rechecked if the symptoms have not resolved by next week.  Return to the emergency room if you start having fevers, swelling, worsening symptoms.

## 2021-10-13 DIAGNOSIS — M9902 Segmental and somatic dysfunction of thoracic region: Secondary | ICD-10-CM | POA: Diagnosis not present

## 2021-10-13 DIAGNOSIS — M542 Cervicalgia: Secondary | ICD-10-CM | POA: Diagnosis not present

## 2021-10-13 DIAGNOSIS — R293 Abnormal posture: Secondary | ICD-10-CM | POA: Diagnosis not present

## 2021-10-13 DIAGNOSIS — M5137 Other intervertebral disc degeneration, lumbosacral region: Secondary | ICD-10-CM | POA: Diagnosis not present

## 2021-10-13 DIAGNOSIS — M9903 Segmental and somatic dysfunction of lumbar region: Secondary | ICD-10-CM | POA: Diagnosis not present

## 2021-10-13 DIAGNOSIS — M791 Myalgia, unspecified site: Secondary | ICD-10-CM | POA: Diagnosis not present

## 2021-10-13 DIAGNOSIS — M5459 Other low back pain: Secondary | ICD-10-CM | POA: Diagnosis not present

## 2021-10-13 DIAGNOSIS — M9901 Segmental and somatic dysfunction of cervical region: Secondary | ICD-10-CM | POA: Diagnosis not present

## 2021-10-13 DIAGNOSIS — M50222 Other cervical disc displacement at C5-C6 level: Secondary | ICD-10-CM | POA: Diagnosis not present

## 2021-10-14 ENCOUNTER — Encounter: Payer: Self-pay | Admitting: Cardiology

## 2021-10-14 ENCOUNTER — Telehealth: Payer: Self-pay | Admitting: *Deleted

## 2021-10-14 DIAGNOSIS — G4733 Obstructive sleep apnea (adult) (pediatric): Secondary | ICD-10-CM

## 2021-10-14 NOTE — Telephone Encounter (Addendum)
The patient has been notified of the result and verbalized understanding.  All questions (if any) were answered. Marolyn Hammock, [place] 10/14/2021 1:55 PM     Patient has declined to move forward with the cpap he wants to check on other alternatives.

## 2021-10-14 NOTE — Telephone Encounter (Signed)
-----   Message from Sueanne Margarita, MD sent at 09/25/2021  8:51 PM EST ----- Please let patient know that they have sleep apnea and recommend treating with CPAP.  Please order an auto CPAP from 4-15cm H2O with heated humidity and mask of choice.  Order overnight pulse ox on CPAP.  Followup with me in 6 weeks.

## 2021-10-21 ENCOUNTER — Encounter (HOSPITAL_COMMUNITY): Payer: Self-pay | Admitting: Nurse Practitioner

## 2021-10-21 ENCOUNTER — Ambulatory Visit (HOSPITAL_COMMUNITY)
Admit: 2021-10-21 | Discharge: 2021-10-21 | Disposition: A | Discharge status: Home / Self Care | Payer: Medicare Other | Attending: Nurse Practitioner | Admitting: Nurse Practitioner

## 2021-10-21 ENCOUNTER — Other Ambulatory Visit: Payer: Self-pay

## 2021-10-21 VITALS — BP 122/86 | HR 53 | Ht 70.0 in | Wt 177.0 lb

## 2021-10-21 DIAGNOSIS — I480 Paroxysmal atrial fibrillation: Secondary | ICD-10-CM | POA: Diagnosis not present

## 2021-10-21 DIAGNOSIS — I422 Other hypertrophic cardiomyopathy: Secondary | ICD-10-CM | POA: Diagnosis not present

## 2021-10-21 DIAGNOSIS — R001 Bradycardia, unspecified: Secondary | ICD-10-CM | POA: Insufficient documentation

## 2021-10-21 DIAGNOSIS — Z7901 Long term (current) use of anticoagulants: Secondary | ICD-10-CM | POA: Diagnosis not present

## 2021-10-21 DIAGNOSIS — I4891 Unspecified atrial fibrillation: Secondary | ICD-10-CM

## 2021-10-21 DIAGNOSIS — I440 Atrioventricular block, first degree: Secondary | ICD-10-CM | POA: Insufficient documentation

## 2021-10-21 DIAGNOSIS — Z79899 Other long term (current) drug therapy: Secondary | ICD-10-CM | POA: Insufficient documentation

## 2021-10-21 DIAGNOSIS — Z95810 Presence of automatic (implantable) cardiac defibrillator: Secondary | ICD-10-CM | POA: Diagnosis not present

## 2021-10-21 DIAGNOSIS — R002 Palpitations: Secondary | ICD-10-CM | POA: Insufficient documentation

## 2021-10-21 DIAGNOSIS — I4892 Unspecified atrial flutter: Secondary | ICD-10-CM | POA: Diagnosis not present

## 2021-10-21 DIAGNOSIS — D6869 Other thrombophilia: Secondary | ICD-10-CM | POA: Diagnosis not present

## 2021-10-21 NOTE — Progress Notes (Signed)
Primary Care Physician: Ginger Organ., MD Referring Physician:Dr. Allred  EP: Dr. Everlean Patterson Joseph Morrow is a 76 y.o. male with a h/o hypertrophic cardiomyopathy, s/p ICD for VT, remote paroxysmal afib, maintaining SR with a very small afib burden on Sotalol. Hospitalized for sotalol administration 02/2017. Chadsvasc score of 2(age, LV dysfunction), continues on eliquis 5 mg bid.  Qtc stable.   He asked to be seen today for afib noted yesterday. Interrogation of ICD/PPM confirms afib on 2/10 and 2/11, mostly starting 2/10 at 11:54 pm and continuing to 5:03 am, 2/11. He states that he drinks alcohol nightly and he often takes his sotalol 14 hours apart. Otherwise, no change from his usual health. Denies snoring. He continues on sotalol.  F/u in afib clinic, 02/14/20. I am seeing him as the device clinic noted onset of atrial flutter, rate controlled since  5/30. In the office he continues in rate controlled aflutter. He is mildly symptomatic with this. He continues on sotalol 120 mg bid, and is compliant. He has 2 alcoholic drinks a week. He had been on BB in the past . Stopped when sotalol loaded. Continues on eliquis 5 mg bid for a CHA2DS2VASc score of 3.   F/u in afib clinic, 03/11/20, as an urgent work-in as he noted increase in HR last night via Fitbit and confirmed with [name]. If not for seeing his HR elevated, he would not have felt  differently out of rhythm. He is on sotalol 120 mg bid and has been on this for several years. He saw Dr. Rayann Heman in June , was doing ok, but it was mentioned in his note, that if afib burden increased, he could consider ablation.  No significant change in health recently. No alcohol, caffeine, tobacco.  By  by the device clinic report this am, he appears to be in an atrial flutter at 117 bpm. BP on arrival 88/70. With a glass of water, BP came up to 104/70. The only medicine he is on is 12.5 metoprolol succinate that would affect BP. This was added in June  on an occasion  when he was out of rhythm. He will usually convert himself.   F/u in afib clinic, 07/18/20. He called the clinic earlier in the week, that he was out of rhythm. He does report that he missed all meds Friday pm and went out of rhythm Saturday pm, including his DOAC. He is in an atrial  Flutter, rate controlled. Reading his ablation OP report, he did  have at least 3 flutter circuits that could not be mapped or ablated. No swallowing or groin issues.  F/u in afib clinic, 11/11. He has returned to [place]. Ekg shows sinus brady at 57 bpm, with first degree AV block.  PR int 236 ms, qrs int 66 ms, qtc 428 ms. He feels well.   F/u 08/02/20. He is now one month out from  ablation and has been in [place] for the last 2 weeks. He feels improved. No issues today.   F/u afib clinic 07/24/21. He went out of rhythm  over the weekend. He has noted more exertional dyspnea and fatigue with this episode. He had vertigo last week with dizziness and N/V with PCP treating with Antivert. It is now better. He has atrial flutter with RVR at 111 bpm.   F/u 08/04/21. He is in the clinic as he went out of rhythm yesterday with RVR at times. He hs noted some H/A at times since being  out of rhythm. He just had a successful cardioversion 11/16 but now with ERAF. His ablation was last October.  Because of hypertrophic cardiomyopathy, his best drug choice would be amiodarone if sotalol was stopped.   F/u 10/21/21 for one month s/p ablation. He is maintaining  SR. He feels well. He had a bruise left thigh but it is resolved . No swallowing issues. Remains on sotalol and metoprolol . No issues with anticoagultion.   Today, he denies symptoms of   chest pain, shortness of breath, orthopnea, PND, lower extremity edema, dizziness, presyncope, syncope, or neurologic sequela.  Positive for palpitations..The patient is tolerating medications without difficulties and is otherwise without complaint today.   Past Medical History:   Diagnosis Date   Acute cholecystitis 06/21/2011   Allergy    SEASONAL   Cataract    Complication of anesthesia    HALLUCINATIONS   GERD (gastroesophageal reflux disease)    Hearing loss    Hemorrhoids    Hiatal hernia    Hypertrophic cardiomyopathy (HCC)    Mitral regurgitation    Moderate   Paroxysmal atrial fibrillation ([place]) 04/2015   documented on ICD remote interrogation, treated with sotalol   Septal defect    LV Septal hyptertrophy 16mm, normal EF, minimal LVOT gradiant.   Tubular adenoma of colon    VT (ventricular tachycardia) 08/18/2013   s/p St. Jude ICD   Past Surgical History:  Procedure Laterality Date   APPENDECTOMY  1950   ATRIAL FIBRILLATION ABLATION N/A 07/04/2020   Procedure: ATRIAL FIBRILLATION ABLATION;  Surgeon: [name], MD;  Location: [facility];  Service: Cardiovascular;  Laterality: N/A;   ATRIAL FIBRILLATION ABLATION N/A 09/18/2021   Procedure: ATRIAL FIBRILLATION ABLATION;  Surgeon: Vickie Epley, MD;  Location: [facility];  Service: Cardiovascular;  Laterality: N/A;   CARDIOVERSION N/A 02/25/2017   Procedure: CARDIOVERSION;  Surgeon: Fay Records, MD;  Location: [facility] ENDOSCOPY;  Service: Cardiovascular;  Laterality: N/A;   CARDIOVERSION N/A 07/30/2021   Procedure: CARDIOVERSION;  Surgeon: Janina Mayo, MD;  Location: [place];  Service: Cardiovascular;  Laterality: N/A;   CHOLECYSTECTOMY  06/24/2011   COLONOSCOPY     EP IMPLANTABLE DEVICE N/A 09/03/2016   SJM Fortify Assura Dr generator change by Dr Rayann Heman.  ICD implanted for secondary prevention of sudden death   IMPLANTABLE CARDIOVERTER DEFIBRILLATOR IMPLANT  08/18/2013   St. Jude Fortify Assura DR ICD implanted by Dr Rayann Heman for secondary treatment of VT   IMPLANTABLE CARDIOVERTER DEFIBRILLATOR IMPLANT N/A 08/18/2013   Procedure: IMPLANTABLE CARDIOVERTER DEFIBRILLATOR IMPLANT;  Surgeon: Coralyn Mark, MD;  [facility] C[facility] LAB;  Service: Cardiovascular;  Laterality:  N/A;   TEE WITHOUT CARDIOVERSION N/A 07/30/2021   Procedure: TRANSESOPHAGEAL ECHOCARDIOGRAM (TEE);  Surgeon: Janina Mayo, MD;  Location: [facility] ENDOSCOPY;  Service: Cardiovascular;  Laterality: N/A;    Current Outpatient Medications  Medication Sig Dispense Refill   acetaminophen (TYLENOL) 500 MG tablet Take 500-1,000 mg by mouth every 6 (six) hours as needed (pain.).     apixaban (ELIQUIS) 5 MG TABS tablet Take 1 tablet (5 mg total) by mouth 2 (two) times daily. 60 tablet 6   calcium carbonate (OSCAL) 1500 (600 Ca) MG TABS tablet Take 600 mg by mouth in the morning and at bedtime.     calcium carbonate (TUMS EX) 750 MG chewable tablet Chew 1 tablet by mouth 3 (three) times daily as needed for heartburn.     cholecalciferol (VITAMIN D) 25 MCG (1000 UNIT)  tablet Take 1,000 Units by mouth in the morning and at bedtime.     Coenzyme Q10 (CO Q 10) 100 MG CAPS Take 100 mg by mouth in the morning.     colchicine 0.6 MG tablet Take 1 tablet (0.6 mg total) by mouth 2 (two) times daily. 10 tablet 0   diltiazem (CARDIZEM) 30 MG tablet Cardizem 30mg  -- take 1 tablet every 4 hours AS NEEDED for heart rate >100 30 tablet 3   furosemide (LASIX) 20 MG tablet Take 1 tablet by mouth daily until 11/16 then only as needed for swelling/weight gain 30 tablet 11   Glucosamine-Chondroitin (COSAMIN DS PO) Take 1 tablet by mouth in the morning.     metoprolol succinate (TOPROL-XL) 50 MG 24 hr tablet Take 1 tablet (50 mg total) by mouth 2 (two) times daily. Take with or immediately following a meal. 60 tablet 6   omeprazole (PRILOSEC OTC) 20 MG tablet Take 10 mg by mouth in the morning.     pantoprazole (PROTONIX) 40 MG tablet Take 1 tablet (40 mg total) by mouth daily. Patient will take for 45 days post ablation then stop 45 tablet 0   potassium citrate (UROCIT-K) 10 MEQ (1080 MG) SR tablet Take 1 tablet (10 mEq total) by mouth daily as needed (with lasix (swelling/weight gain)). 30 tablet 11   Probiotic Product (ALIGN  PO) Take 1 capsule by mouth in the morning.     sotalol (BETAPACE) 120 MG tablet TAKE 1 TABLET(120 MG) BY MOUTH TWICE DAILY 180 tablet 1   Zinc 50 MG CAPS Take 50 mg by mouth 2 (two) times daily.      No current facility-administered medications for this encounter.    Allergies  Allergen Reactions   Dextromethorphan Other (See Comments)    nervous   Versed [Midazolam] Other (See Comments)    "hallucinations, headache"   Antihistamines, Diphenhydramine-Type Anxiety    Social History   Socioeconomic History   Marital status: Married    Spouse name: Not on file   Number of children: 2   Years of education: Not on file   Highest education level: Not on file  Occupational History   Occupation: retired  Tobacco Use   Smoking status: Former    Packs/day: 2.00    Years: 20.00    Pack years: 40.00    Types: Cigarettes    Quit date: 07/06/1981    Years since quitting: 40.3   Smokeless tobacco: Never   Tobacco comments:    Former smoker 10/21/21  Vaping Use   Vaping Use: Never used  Substance and Sexual Activity   Alcohol use: Not Currently    Comment: stop drinking 2 glasses of wine since ablation 10/21/21   Drug use: No   Sexual activity: Not on file  Other Topics Concern   Not on file  Social History Narrative   Originally from [place]. Moved to [place] in 2012. Currently has no pets. No bird or hot tub exposure. 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 He did have exposure to Beneze, Acetone, Styrene, & Hydrocarbon early in his career.   Social Determinants of Health   Financial Resource Strain: Not on file  Food Insecurity: Not on file  Transportation Needs: Not on file  Physical Activity: Not on file  Stress: Not on file  Social Connections: Not on file  Intimate Partner Violence: Not on file    Family History  Problem Relation Age of Onset  Hypertrophic cardiomyopathy Mother    Arrhythmia Mother    Heart failure Mother     Pneumonia Father    Diabetes Father    Colon polyps Father    Heart attack Brother    Hypertrophic cardiomyopathy Brother    Heart Problems Brother        VAD   Arrhythmia Brother    Diabetes Brother    Sudden death Brother    Lung disease Neg Hx     ROS- All systems are reviewed and negative except as per the HPI above  Physical Exam: Vitals:   10/21/21 1149  BP: 122/86  Pulse: (!) 53  Weight: 80.3 kg  Height: 5\' 10"  (1.778 m)    Wt Readings from Last 3 Encounters:  10/21/21 80.3 kg  10/10/21 78.5 kg  09/18/21 79.4 kg    Labs: Lab Results  Component Value Date   NA 137 10/10/2021   K 3.9 10/10/2021   CL 102 10/10/2021   CO2 27 10/10/2021   GLUCOSE 114 (H) 10/10/2021   BUN 12 10/10/2021   CREATININE 1.10 10/10/2021   CALCIUM 9.0 10/10/2021   MG 2.1 07/24/2021   Lab Results  Component Value Date   INR 1.2 10/10/2021   No results found for: CHOL, HDL, LDLCALC, TRIG   GEN- The patient is well appearing, alert and oriented x 3 today.   Head- normocephalic, atraumatic Eyes-  Sclera clear, conjunctiva pink Ears- hearing intact Oropharynx- clear Neck- supple, no JVP Lymph- no cervical lymphadenopathy Lungs- Clear to ausculation bilaterally, normal work of breathing Heart regular rate and rhythm, no murmurs, rubs or gallops, PMI not laterally displaced GI- soft, NT, ND, + BS Extremities- no clubbing, cyanosis, or edema MS- no significant deformity or atrophy Skin- no rash or lesion Psych- euthymic mood, full affect Neuro- strength and sensation are intact  EKG- Vent. rate 53 BPM PR interval 260 ms QRS duration 68 ms QT/QTcB 456/427 ms P-R-T axes 62 90 -56 Sinus bradycardia with 1st degree A-V block Rightward axis Abnormal QRS-T angle, consider primary T wave abnormality Abnormal ECG When compared with ECG of 10-Oct-2021 05:21,   1. Hypertrophic cardiomyopathy, sigmoid subtype. Basal septum measures 18  mm in maximal dimension. Flow acceleration  noted at the LVOT, no LVOT  obstruction noted. Chordal systolic anterior motion of the mitral valve  without involvment of the anterior  leaflet.   2. Left ventricular ejection fraction, by visual estimation, is 60 to  65%. The left ventricle has normal function. There is severely increased  left ventricular hypertrophy.   3. Elevated left atrial and left ventricular end-diastolic pressures.   4. Left ventricular diastolic parameters are consistent with Grade III  diastolic dysfunction (restrictive).   5. Global right ventricle has normal systolic function.The right  ventricular size is normal. No increase in right ventricular wall  thickness.   6. Left atrial size was moderately dilated.   7. Right atrial size was moderately dilated.   8. The mitral valve is normal in structure. Mild mitral valve  regurgitation. No evidence of mitral stenosis.   9. The tricuspid valve is normal in structure. Tricuspid valve  regurgitation is mild.  10. The aortic valve is tricuspid. Aortic valve regurgitation is not  visualized. No evidence of aortic valve sclerosis or stenosis.  11. The pulmonic valve was normal in structure. Pulmonic valve  regurgitation is mild.  12. Moderately elevated pulmonary artery systolic pressure.  13. The inferior vena cava is dilated in size with <50% respiratory  variability, suggesting right atrial pressure of 15 mmHg.  14. The left ventricle has no regional wall motion abnormalities.  Echo- 2020----------------------------------------------------------------- Study Conclusions   - Left ventricle: The cavity size was normal. There was moderate   asymmetric hypertrophy of the septum. The appearance was   consistent with hypertrophic cardiomyopathy. Systolic function   was vigorous. The estimated ejection fraction was in the range of   65% to 70%. There was no dynamic obstruction. Wall motion was   normal; there were no regional wall motion abnormalities. - Mitral  valve: There was mild to moderate regurgitation directed   centrally. - Left atrium: The atrium was severely dilated. - Right ventricle: The cavity size was mildly dilated. - Right atrium: The atrium was mildly dilated. - Tricuspid valve: There was moderate regurgitation. - Pulmonary arteries: Systolic pressure was mildly increased. PA   peak pressure: 41 mm Hg (S).   Assessment and Plan: 1. Atrial  flutter  S/p  ablation 06/2020  Had a redo ablation one month ago after a successful CV but ERAF He is maintaining  SR  He will continue on sotalol 120 mg bid and metoprolol 50 mg bid  Continue eliquis 5 mg bid for a CHA2DS2VASc score of at least 3, reminded not to interrupt anticoagulation for the 3 month post procedure period   2. HCM Stable  St Jude -Merlin ICD Per Dr. Quentin Ore   F/u with Dr. Quentin Ore 12/19/21   Joseph Morrow, [facility] [address] [place], [place] [address] [phone]

## 2021-10-23 ENCOUNTER — Encounter: Payer: Self-pay | Admitting: *Deleted

## 2021-10-27 ENCOUNTER — Institutional Professional Consult (permissible substitution): Payer: Medicare Other | Admitting: Neurology

## 2021-10-27 DIAGNOSIS — R293 Abnormal posture: Secondary | ICD-10-CM | POA: Diagnosis not present

## 2021-10-27 DIAGNOSIS — M791 Myalgia, unspecified site: Secondary | ICD-10-CM | POA: Diagnosis not present

## 2021-10-27 DIAGNOSIS — M50222 Other cervical disc displacement at C5-C6 level: Secondary | ICD-10-CM | POA: Diagnosis not present

## 2021-10-27 DIAGNOSIS — M9901 Segmental and somatic dysfunction of cervical region: Secondary | ICD-10-CM | POA: Diagnosis not present

## 2021-10-27 DIAGNOSIS — M542 Cervicalgia: Secondary | ICD-10-CM | POA: Diagnosis not present

## 2021-10-27 DIAGNOSIS — M5137 Other intervertebral disc degeneration, lumbosacral region: Secondary | ICD-10-CM | POA: Diagnosis not present

## 2021-10-27 DIAGNOSIS — M5459 Other low back pain: Secondary | ICD-10-CM | POA: Diagnosis not present

## 2021-10-27 DIAGNOSIS — M9902 Segmental and somatic dysfunction of thoracic region: Secondary | ICD-10-CM | POA: Diagnosis not present

## 2021-10-27 DIAGNOSIS — M9903 Segmental and somatic dysfunction of lumbar region: Secondary | ICD-10-CM | POA: Diagnosis not present

## 2021-11-03 ENCOUNTER — Ambulatory Visit (INDEPENDENT_AMBULATORY_CARE_PROVIDER_SITE_OTHER): Payer: Self-pay | Admitting: Neurology

## 2021-11-03 ENCOUNTER — Encounter: Payer: Self-pay | Admitting: Neurology

## 2021-11-03 NOTE — Progress Notes (Signed)
Appt canceled. No longer needed.

## 2021-11-04 ENCOUNTER — Telehealth: Payer: Self-pay | Admitting: Cardiology

## 2021-11-04 NOTE — Telephone Encounter (Signed)
Patient states he never heard back from our office at sleep study with Dr. Radford Pax. He would like to know how to proceed. Please advise.

## 2021-11-06 NOTE — Telephone Encounter (Signed)
Per dr Radford Pax,  Please refer to Dr. Augustina Mood for evaluation for oral dental device for obstructive sleep apnea. Patient notified.

## 2021-11-06 NOTE — Telephone Encounter (Addendum)
Patient states, I am very interested in trying the dental appliance described in Traci Turner's information. I have a dentist that can do the incursion but do not know what the procedure is to get a prescription or whatever I need for him.

## 2021-11-10 DIAGNOSIS — M5459 Other low back pain: Secondary | ICD-10-CM | POA: Diagnosis not present

## 2021-11-10 DIAGNOSIS — M791 Myalgia, unspecified site: Secondary | ICD-10-CM | POA: Diagnosis not present

## 2021-11-10 DIAGNOSIS — M5137 Other intervertebral disc degeneration, lumbosacral region: Secondary | ICD-10-CM | POA: Diagnosis not present

## 2021-11-10 DIAGNOSIS — R293 Abnormal posture: Secondary | ICD-10-CM | POA: Diagnosis not present

## 2021-11-10 DIAGNOSIS — M50222 Other cervical disc displacement at C5-C6 level: Secondary | ICD-10-CM | POA: Diagnosis not present

## 2021-11-10 DIAGNOSIS — M9901 Segmental and somatic dysfunction of cervical region: Secondary | ICD-10-CM | POA: Diagnosis not present

## 2021-11-10 DIAGNOSIS — M9902 Segmental and somatic dysfunction of thoracic region: Secondary | ICD-10-CM | POA: Diagnosis not present

## 2021-11-10 DIAGNOSIS — M9903 Segmental and somatic dysfunction of lumbar region: Secondary | ICD-10-CM | POA: Diagnosis not present

## 2021-11-10 DIAGNOSIS — M542 Cervicalgia: Secondary | ICD-10-CM | POA: Diagnosis not present

## 2021-11-11 DIAGNOSIS — M8580 Other specified disorders of bone density and structure, unspecified site: Secondary | ICD-10-CM | POA: Diagnosis not present

## 2021-11-11 DIAGNOSIS — E781 Pure hyperglyceridemia: Secondary | ICD-10-CM | POA: Diagnosis not present

## 2021-11-11 DIAGNOSIS — N1831 Chronic kidney disease, stage 3a: Secondary | ICD-10-CM | POA: Diagnosis not present

## 2021-11-11 DIAGNOSIS — I480 Paroxysmal atrial fibrillation: Secondary | ICD-10-CM | POA: Diagnosis not present

## 2021-11-11 NOTE — Telephone Encounter (Signed)
Referral placed to [phone].

## 2021-11-24 DIAGNOSIS — M542 Cervicalgia: Secondary | ICD-10-CM | POA: Diagnosis not present

## 2021-11-24 DIAGNOSIS — M791 Myalgia, unspecified site: Secondary | ICD-10-CM | POA: Diagnosis not present

## 2021-11-24 DIAGNOSIS — M9901 Segmental and somatic dysfunction of cervical region: Secondary | ICD-10-CM | POA: Diagnosis not present

## 2021-11-24 DIAGNOSIS — R293 Abnormal posture: Secondary | ICD-10-CM | POA: Diagnosis not present

## 2021-11-24 DIAGNOSIS — M9903 Segmental and somatic dysfunction of lumbar region: Secondary | ICD-10-CM | POA: Diagnosis not present

## 2021-11-24 DIAGNOSIS — M50222 Other cervical disc displacement at C5-C6 level: Secondary | ICD-10-CM | POA: Diagnosis not present

## 2021-11-24 DIAGNOSIS — M5137 Other intervertebral disc degeneration, lumbosacral region: Secondary | ICD-10-CM | POA: Diagnosis not present

## 2021-11-24 DIAGNOSIS — M9902 Segmental and somatic dysfunction of thoracic region: Secondary | ICD-10-CM | POA: Diagnosis not present

## 2021-11-24 DIAGNOSIS — M5459 Other low back pain: Secondary | ICD-10-CM | POA: Diagnosis not present

## 2021-12-05 ENCOUNTER — Ambulatory Visit (INDEPENDENT_AMBULATORY_CARE_PROVIDER_SITE_OTHER): Payer: Medicare Other

## 2021-12-05 DIAGNOSIS — I472 Ventricular tachycardia, unspecified: Secondary | ICD-10-CM | POA: Diagnosis not present

## 2021-12-08 DIAGNOSIS — M5459 Other low back pain: Secondary | ICD-10-CM | POA: Diagnosis not present

## 2021-12-08 DIAGNOSIS — M542 Cervicalgia: Secondary | ICD-10-CM | POA: Diagnosis not present

## 2021-12-08 DIAGNOSIS — M9903 Segmental and somatic dysfunction of lumbar region: Secondary | ICD-10-CM | POA: Diagnosis not present

## 2021-12-08 DIAGNOSIS — R293 Abnormal posture: Secondary | ICD-10-CM | POA: Diagnosis not present

## 2021-12-08 DIAGNOSIS — M791 Myalgia, unspecified site: Secondary | ICD-10-CM | POA: Diagnosis not present

## 2021-12-08 DIAGNOSIS — M5137 Other intervertebral disc degeneration, lumbosacral region: Secondary | ICD-10-CM | POA: Diagnosis not present

## 2021-12-08 DIAGNOSIS — M50222 Other cervical disc displacement at C5-C6 level: Secondary | ICD-10-CM | POA: Diagnosis not present

## 2021-12-08 DIAGNOSIS — M9901 Segmental and somatic dysfunction of cervical region: Secondary | ICD-10-CM | POA: Diagnosis not present

## 2021-12-08 DIAGNOSIS — M9902 Segmental and somatic dysfunction of thoracic region: Secondary | ICD-10-CM | POA: Diagnosis not present

## 2021-12-08 LAB — CUP PACEART REMOTE DEVICE CHECK
Battery Remaining Longevity: 49 mo
Battery Remaining Percentage: 48 %
Battery Voltage: 2.95 V
Brady Statistic AP VP Percent: 1 %
Brady Statistic AP VS Percent: 24 %
Brady Statistic AS VP Percent: 1 %
Brady Statistic AS VS Percent: 76 %
Brady Statistic RA Percent Paced: 24 %
Brady Statistic RV Percent Paced: 1 %
Date Time Interrogation Session: 20230324020018
HighPow Impedance: 53 Ohm
HighPow Impedance: 53 Ohm
Implantable Lead Implant Date: 20141205
Implantable Lead Implant Date: 20141205
Implantable Lead Location: 753859
Implantable Lead Location: 753860
Implantable Lead Model: 7121
Implantable Pulse Generator Implant Date: 20171221
Lead Channel Impedance Value: 440 Ohm
Lead Channel Impedance Value: 530 Ohm
Lead Channel Pacing Threshold Amplitude: 0.5 V
Lead Channel Pacing Threshold Amplitude: 1 V
Lead Channel Pacing Threshold Pulse Width: 0.5 ms
Lead Channel Pacing Threshold Pulse Width: 0.5 ms
Lead Channel Sensing Intrinsic Amplitude: 12 mV
Lead Channel Sensing Intrinsic Amplitude: 4.2 mV
Lead Channel Setting Pacing Amplitude: 2 V
Lead Channel Setting Pacing Amplitude: 2.5 V
Lead Channel Setting Pacing Pulse Width: 0.5 ms
Lead Channel Setting Sensing Sensitivity: 0.5 mV
Pulse Gen Serial Number: 7387049

## 2021-12-10 NOTE — Progress Notes (Signed)
Remote ICD transmission.   

## 2021-12-12 DIAGNOSIS — N1831 Chronic kidney disease, stage 3a: Secondary | ICD-10-CM | POA: Diagnosis not present

## 2021-12-12 DIAGNOSIS — M8580 Other specified disorders of bone density and structure, unspecified site: Secondary | ICD-10-CM | POA: Diagnosis not present

## 2021-12-12 DIAGNOSIS — I480 Paroxysmal atrial fibrillation: Secondary | ICD-10-CM | POA: Diagnosis not present

## 2021-12-12 DIAGNOSIS — E781 Pure hyperglyceridemia: Secondary | ICD-10-CM | POA: Diagnosis not present

## 2021-12-19 ENCOUNTER — Encounter: Payer: Self-pay | Admitting: Cardiology

## 2021-12-19 ENCOUNTER — Ambulatory Visit (INDEPENDENT_AMBULATORY_CARE_PROVIDER_SITE_OTHER): Payer: Medicare Other | Admitting: Cardiology

## 2021-12-19 VITALS — BP 122/76 | HR 61 | Ht 70.0 in | Wt 181.0 lb

## 2021-12-19 DIAGNOSIS — I4819 Other persistent atrial fibrillation: Secondary | ICD-10-CM | POA: Diagnosis not present

## 2021-12-19 DIAGNOSIS — I484 Atypical atrial flutter: Secondary | ICD-10-CM

## 2021-12-19 DIAGNOSIS — Z95810 Presence of automatic (implantable) cardiac defibrillator: Secondary | ICD-10-CM | POA: Diagnosis not present

## 2021-12-19 LAB — PACEMAKER DEVICE OBSERVATION

## 2021-12-19 NOTE — Progress Notes (Signed)
?Electrophysiology Office Follow up Visit Note:   ? ?Date:  12/19/2021  ? ?ID:  Joseph Morrow, DOB 07-Apr-1946, MRN [id number] ? ?PCP:  Ginger Organ., MD  ?[facility] Cardiologist:  None  ?[facility] Electrophysiologist:  Vickie Epley, MD  ? ? ?Interval History:   ? ?Joseph Morrow is a 76 y.o. male who presents for a follow up visit. They were last seen in clinic 09/10/2021. ?Since their last appointment, they underwent atrial fibrillation ablation 09/18/2021. He presented to the ED 10/10/2021 with complaints of pleuritic type chest discomfort and sore throat. Overall ED work-up was reassuring and not suggestive of cardiac etiology. ? ?At his one month s/p ablation follow-up with Roderic Palau, NP 10/21/2021, he was maintaining sinus rhythm and was feeling well.  ? ?Overall, he is feeling good. He reports that his last remote interrogation showed 1% Afib burden.  ? ?He remains compliant with Eliquis and denies any bleeding issues. ? ?He is going to use an oral appliance for treatment of his sleep apnea.  ? ?He denies any palpitations, chest pain, shortness of breath, or peripheral edema. No lightheadedness, headaches, syncope, orthopnea, or PND. ? ? ? ?  ? ?Past Medical History:  ?Diagnosis Date  ? Acute cholecystitis 06/21/2011  ? Allergy   ? SEASONAL  ? Cataract   ? Complication of anesthesia   ? HALLUCINATIONS  ? ED (erectile dysfunction)   ? GERD (gastroesophageal reflux disease)   ? Hearing loss   ? Helicobacter pylori gastritis 06/2014  ? Hemorrhoids   ? Hiatal hernia   ? Hx of adenomatous colonic polyps 06/2014  ? Hypertrophic cardiomyopathy ([place])   ? Mitral regurgitation   ? Moderate  ? Osteopenia   ? Paroxysmal atrial fibrillation ([place]) 04/2015  ? documented on ICD remote interrogation, treated with sotalol  ? Septal defect   ? LV Septal hyptertrophy 54m, normal EF, minimal LVOT gradiant.  ? Tubular adenoma of colon   ? [place] 08/18/[place]  ? s/p St. Jude ICD   ? ? ?Past Surgical History:  ?Procedure Laterality Date  ? APPENDECTOMY  1950  ? ATRIAL FIBRILLATION ABLATION N/A 07/04/2020  ? Procedure: ATRIAL FIBRILLATION ABLATION;  Surgeon: AThompson Grayer MD;  [facility];  Service: Cardiovascular;  Laterality: N/A;  ? ATRIAL FIBRILLATION ABLATION N/A 09/18/2021  ? Procedure: ATRIAL FIBRILLATION ABLATION;  Surgeon: LVickie Epley MD;  [facility];  Service: Cardiovascular;  Laterality: N/A;  ? CARDIOVERSION N/A 02/25/2017  ? Procedure: CARDIOVERSION;  Surgeon: RFay Records MD;  [place] [facility]  Service: Cardiovascular;  Laterality: N/A;  ? CARDIOVERSION N/A 07/30/2021  ? Procedure: CARDIOVERSION;  Surgeon: BJanina Mayo MD;  [place]  Service: Cardiovascular;  Laterality: N/A;  ? CATARACT EXTRACTION, BILATERAL    ? CHOLECYSTECTOMY  06/24/2011  ? COLONOSCOPY    ? EP IMPLANTABLE DEVICE N/A 09/03/2016  ? SJM Fortify Assura Dr generator change by Dr ARayann Heman  ICD implanted for secondary prevention of sudden death  ? IMPLANTABLE CARDIOVERTER DEFIBRILLATOR IMPLANT  08/18/2013  ? St. Jude Fortify Assura DR ICD implanted by Dr ARayann Hemanfor secondary treatment of VT  ? IMPLANTABLE CARDIOVERTER DEFIBRILLATOR IMPLANT N/A 08/18/2013  ? Procedure: IMPLANTABLE CARDIOVERTER DEFIBRILLATOR IMPLANT;  Surgeon: JCoralyn Mark MD;  [facility];  Service: Cardiovascular;  Laterality: N/A;  ? TEE WITHOUT CARDIOVERSION N/A 07/30/2021  ? Procedure: TRANSESOPHAGEAL ECHOCARDIOGRAM (TEE);  Surgeon: BJanina Mayo MD;  [facility];  Service: Cardiovascular;  Laterality: N/A;  ? ? ?Current Medications: ?Current Meds  ?Medication Sig  ? acetaminophen (TYLENOL) 500 MG tablet Take 500-1,000 mg by mouth every 6 (six) hours as needed (pain.).  ? apixaban (ELIQUIS) 5 MG TABS tablet Take 1 tablet (5 mg total) by mouth 2 (two) times daily.  ? calcium carbonate (OSCAL) 1500 (600 Ca) MG TABS tablet Take 600 mg by mouth in the  morning and at bedtime.  ? calcium carbonate (TUMS EX) 750 MG chewable tablet Chew 1 tablet by mouth 3 (three) times daily as needed for heartburn.  ? cholecalciferol (VITAMIN D) 25 MCG (1000 UNIT) tablet Take 1,000 Units by mouth in the morning and at bedtime.  ? Coenzyme Q10 (CO Q 10) 100 MG CAPS Take 100 mg by mouth in the morning.  ? colchicine 0.6 MG tablet Take 1 tablet (0.6 mg total) by mouth 2 (two) times daily.  ? diltiazem (CARDIZEM) 30 MG tablet Cardizem '30mg'$  -- take 1 tablet every 4 hours AS NEEDED for heart rate >100  ? Glucosamine-Chondroitin (COSAMIN DS PO) Take 1 tablet by mouth in the morning.  ? metoprolol succinate (TOPROL-XL) 50 MG 24 hr tablet Take 1 tablet (50 mg total) by mouth 2 (two) times daily. Take with or immediately following a meal.  ? omeprazole (PRILOSEC OTC) 20 MG tablet Take 10 mg by mouth in the morning.  ? Probiotic Product (ALIGN PO) Take 1 capsule by mouth in the morning.  ? sotalol (BETAPACE) 120 MG tablet TAKE 1 TABLET(120 MG) BY MOUTH TWICE DAILY  ? Zinc 50 MG CAPS Take 50 mg by mouth 2 (two) times daily.   ?  ? ?Allergies:   Dextromethorphan; Versed [midazolam]; and Antihistamines, diphenhydramine-type  ? ?Social History  ? ?Socioeconomic History  ? Marital status: Married  ?  Spouse name: Not on file  ? Number of children: 2  ? Years of education: Not on file  ? Highest education level: Not on file  ?Occupational History  ? Occupation: retired  ?Tobacco Use  ? Smoking status: Former  ?  Packs/day: 2.00  ?  Years: 20.00  ?  Pack years: 40.00  ?  Types: Cigarettes  ?  Quit date: 07/06/1981  ?  Years since quitting: 40.4  ? Smokeless tobacco: Never  ? Tobacco comments:  ?  Former smoker 10/21/21  ?Vaping Use  ? Vaping Use: Never used  ?Substance and Sexual Activity  ? Alcohol use: Not Currently  ?  Comment: stop drinking 2 glasses of wine since ablation 10/21/21  ? Drug use: No  ? Sexual activity: Not on file  ?Other Topics Concern  ? Not on file  ?Social History Narrative  ?  Originally from [place]. Moved to [place] in 2012. Currently has no pets. No bird or hot tub exposure. 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 He did have exposure to Beneze, Acetone, Styrene, & Hydrocarbon early in his career.  ? ?Social Determinants of Health  ? ?Financial Resource Strain: Not on file  ?Food Insecurity: Not on file  ?Transportation Needs: Not on file  ?Physical Activity: Not on file  ?Stress: Not on file  ?Social Connections: Not on file  ?  ? ?Family History: ?The patient's family history includes Arrhythmia in his brother and mother; Colon polyps in his father; Diabetes in his brother and father; Heart Problems in his brother and brother; Heart attack in his brother; Heart failure in his mother; Hypertrophic cardiomyopathy in his  brother and mother; Pneumonia in his father; Sleep apnea in his brother; Sudden death in his brother. There is no history of Lung disease. ? ?ROS:   ?Please see the history of present illness.    ?All other systems reviewed and are negative. ? ?EKGs/Labs/Other Studies Reviewed:   ? ?The following studies were reviewed today: ? ?Atrial Fibrillation Ablation 09/18/2021: ?CONCLUSIONS: ?1. Pulmonary vein isolation (wide antral circumferential lesion set) with ablation required to reisolate the left pulmonary veins ?2. Posterior wall ablation and isolation  ?3. Ablation of atypical, perimitral atrial flutter ?4. Ablation of typical atrial flutter ?5. Intracardiac echocardiography reveals trivial pericardial effusion ?6. No early apparent complications. ?7. Add Colchicine 0.'6mg'$  PO BID x 5 days ?8. Protonix '40mg'$  PO BID x 45 days ?  ?Cardiac CTA 09/16/2021: ?FINDINGS: ?A 120 kV prospective scan was triggered in the descending thoracic ?aorta at 111 HU's. Gantry rotation speed was 280 msecs and ?collimation was .9 mm. No beta blockade and no NTG was given. The 3D ?data set was reconstructed in 5% intervals of the 60-80 % of the R-R ?cycle. Diastolic  phases were analyzed on a dedicated work station ?using MPR, MIP and VRT modes. The patient received 95 cc of ?contrast. ?  ?There is normal pulmonary vein drainage into the left atrium (2 on ?the right

## 2021-12-19 NOTE — Patient Instructions (Signed)

## 2021-12-22 DIAGNOSIS — M9901 Segmental and somatic dysfunction of cervical region: Secondary | ICD-10-CM | POA: Diagnosis not present

## 2021-12-22 DIAGNOSIS — M50222 Other cervical disc displacement at C5-C6 level: Secondary | ICD-10-CM | POA: Diagnosis not present

## 2021-12-22 DIAGNOSIS — M5137 Other intervertebral disc degeneration, lumbosacral region: Secondary | ICD-10-CM | POA: Diagnosis not present

## 2021-12-22 DIAGNOSIS — M542 Cervicalgia: Secondary | ICD-10-CM | POA: Diagnosis not present

## 2021-12-22 DIAGNOSIS — M791 Myalgia, unspecified site: Secondary | ICD-10-CM | POA: Diagnosis not present

## 2021-12-22 DIAGNOSIS — M5459 Other low back pain: Secondary | ICD-10-CM | POA: Diagnosis not present

## 2021-12-22 DIAGNOSIS — M9903 Segmental and somatic dysfunction of lumbar region: Secondary | ICD-10-CM | POA: Diagnosis not present

## 2021-12-22 DIAGNOSIS — R293 Abnormal posture: Secondary | ICD-10-CM | POA: Diagnosis not present

## 2021-12-22 DIAGNOSIS — M9902 Segmental and somatic dysfunction of thoracic region: Secondary | ICD-10-CM | POA: Diagnosis not present

## 2022-01-05 DIAGNOSIS — M9903 Segmental and somatic dysfunction of lumbar region: Secondary | ICD-10-CM | POA: Diagnosis not present

## 2022-01-05 DIAGNOSIS — M9901 Segmental and somatic dysfunction of cervical region: Secondary | ICD-10-CM | POA: Diagnosis not present

## 2022-01-05 DIAGNOSIS — R293 Abnormal posture: Secondary | ICD-10-CM | POA: Diagnosis not present

## 2022-01-05 DIAGNOSIS — M5137 Other intervertebral disc degeneration, lumbosacral region: Secondary | ICD-10-CM | POA: Diagnosis not present

## 2022-01-05 DIAGNOSIS — M542 Cervicalgia: Secondary | ICD-10-CM | POA: Diagnosis not present

## 2022-01-05 DIAGNOSIS — M50222 Other cervical disc displacement at C5-C6 level: Secondary | ICD-10-CM | POA: Diagnosis not present

## 2022-01-05 DIAGNOSIS — M5459 Other low back pain: Secondary | ICD-10-CM | POA: Diagnosis not present

## 2022-01-05 DIAGNOSIS — M791 Myalgia, unspecified site: Secondary | ICD-10-CM | POA: Diagnosis not present

## 2022-01-05 DIAGNOSIS — M9902 Segmental and somatic dysfunction of thoracic region: Secondary | ICD-10-CM | POA: Diagnosis not present

## 2022-01-19 ENCOUNTER — Encounter: Payer: Medicare Other | Admitting: Internal Medicine

## 2022-01-19 DIAGNOSIS — M9902 Segmental and somatic dysfunction of thoracic region: Secondary | ICD-10-CM | POA: Diagnosis not present

## 2022-01-19 DIAGNOSIS — R293 Abnormal posture: Secondary | ICD-10-CM | POA: Diagnosis not present

## 2022-01-19 DIAGNOSIS — M5137 Other intervertebral disc degeneration, lumbosacral region: Secondary | ICD-10-CM | POA: Diagnosis not present

## 2022-01-19 DIAGNOSIS — M9903 Segmental and somatic dysfunction of lumbar region: Secondary | ICD-10-CM | POA: Diagnosis not present

## 2022-01-19 DIAGNOSIS — M791 Myalgia, unspecified site: Secondary | ICD-10-CM | POA: Diagnosis not present

## 2022-01-19 DIAGNOSIS — M542 Cervicalgia: Secondary | ICD-10-CM | POA: Diagnosis not present

## 2022-01-19 DIAGNOSIS — M5459 Other low back pain: Secondary | ICD-10-CM | POA: Diagnosis not present

## 2022-01-19 DIAGNOSIS — M50222 Other cervical disc displacement at C5-C6 level: Secondary | ICD-10-CM | POA: Diagnosis not present

## 2022-01-19 DIAGNOSIS — M9901 Segmental and somatic dysfunction of cervical region: Secondary | ICD-10-CM | POA: Diagnosis not present

## 2022-01-26 ENCOUNTER — Other Ambulatory Visit: Payer: Self-pay | Admitting: Physician Assistant

## 2022-02-02 DIAGNOSIS — M791 Myalgia, unspecified site: Secondary | ICD-10-CM | POA: Diagnosis not present

## 2022-02-02 DIAGNOSIS — R293 Abnormal posture: Secondary | ICD-10-CM | POA: Diagnosis not present

## 2022-02-02 DIAGNOSIS — M50222 Other cervical disc displacement at C5-C6 level: Secondary | ICD-10-CM | POA: Diagnosis not present

## 2022-02-02 DIAGNOSIS — M5137 Other intervertebral disc degeneration, lumbosacral region: Secondary | ICD-10-CM | POA: Diagnosis not present

## 2022-02-02 DIAGNOSIS — M542 Cervicalgia: Secondary | ICD-10-CM | POA: Diagnosis not present

## 2022-02-02 DIAGNOSIS — M9901 Segmental and somatic dysfunction of cervical region: Secondary | ICD-10-CM | POA: Diagnosis not present

## 2022-02-02 DIAGNOSIS — M5459 Other low back pain: Secondary | ICD-10-CM | POA: Diagnosis not present

## 2022-02-02 DIAGNOSIS — M9902 Segmental and somatic dysfunction of thoracic region: Secondary | ICD-10-CM | POA: Diagnosis not present

## 2022-02-02 DIAGNOSIS — M9903 Segmental and somatic dysfunction of lumbar region: Secondary | ICD-10-CM | POA: Diagnosis not present

## 2022-02-10 DIAGNOSIS — M545 Low back pain, unspecified: Secondary | ICD-10-CM | POA: Diagnosis not present

## 2022-02-16 DIAGNOSIS — R293 Abnormal posture: Secondary | ICD-10-CM | POA: Diagnosis not present

## 2022-02-16 DIAGNOSIS — M9901 Segmental and somatic dysfunction of cervical region: Secondary | ICD-10-CM | POA: Diagnosis not present

## 2022-02-16 DIAGNOSIS — M50222 Other cervical disc displacement at C5-C6 level: Secondary | ICD-10-CM | POA: Diagnosis not present

## 2022-02-16 DIAGNOSIS — M5137 Other intervertebral disc degeneration, lumbosacral region: Secondary | ICD-10-CM | POA: Diagnosis not present

## 2022-02-16 DIAGNOSIS — M5459 Other low back pain: Secondary | ICD-10-CM | POA: Diagnosis not present

## 2022-02-16 DIAGNOSIS — M791 Myalgia, unspecified site: Secondary | ICD-10-CM | POA: Diagnosis not present

## 2022-02-16 DIAGNOSIS — M9902 Segmental and somatic dysfunction of thoracic region: Secondary | ICD-10-CM | POA: Diagnosis not present

## 2022-02-16 DIAGNOSIS — M542 Cervicalgia: Secondary | ICD-10-CM | POA: Diagnosis not present

## 2022-02-16 DIAGNOSIS — M9903 Segmental and somatic dysfunction of lumbar region: Secondary | ICD-10-CM | POA: Diagnosis not present

## 2022-02-22 ENCOUNTER — Other Ambulatory Visit: Payer: Self-pay | Admitting: Internal Medicine

## 2022-02-22 DIAGNOSIS — I480 Paroxysmal atrial fibrillation: Secondary | ICD-10-CM

## 2022-02-23 NOTE — Telephone Encounter (Signed)
Prescription refill request for Eliquis received. Indication: Afib  Last office visit: 12/19/21 Quentin Ore)  Scr: 1.10 (10/10/21)  Age: 76 Weight: 82.1kg  Appropriate dose and refill sent to requested pharmacy.

## 2022-03-02 DIAGNOSIS — M9903 Segmental and somatic dysfunction of lumbar region: Secondary | ICD-10-CM | POA: Diagnosis not present

## 2022-03-02 DIAGNOSIS — M5459 Other low back pain: Secondary | ICD-10-CM | POA: Diagnosis not present

## 2022-03-02 DIAGNOSIS — R293 Abnormal posture: Secondary | ICD-10-CM | POA: Diagnosis not present

## 2022-03-02 DIAGNOSIS — M5137 Other intervertebral disc degeneration, lumbosacral region: Secondary | ICD-10-CM | POA: Diagnosis not present

## 2022-03-02 DIAGNOSIS — M791 Myalgia, unspecified site: Secondary | ICD-10-CM | POA: Diagnosis not present

## 2022-03-02 DIAGNOSIS — M542 Cervicalgia: Secondary | ICD-10-CM | POA: Diagnosis not present

## 2022-03-02 DIAGNOSIS — M50222 Other cervical disc displacement at C5-C6 level: Secondary | ICD-10-CM | POA: Diagnosis not present

## 2022-03-02 DIAGNOSIS — M9901 Segmental and somatic dysfunction of cervical region: Secondary | ICD-10-CM | POA: Diagnosis not present

## 2022-03-02 DIAGNOSIS — M9902 Segmental and somatic dysfunction of thoracic region: Secondary | ICD-10-CM | POA: Diagnosis not present

## 2022-03-06 ENCOUNTER — Ambulatory Visit (INDEPENDENT_AMBULATORY_CARE_PROVIDER_SITE_OTHER): Payer: Medicare Other

## 2022-03-06 DIAGNOSIS — Z95810 Presence of automatic (implantable) cardiac defibrillator: Secondary | ICD-10-CM

## 2022-03-10 LAB — CUP PACEART REMOTE DEVICE CHECK
Battery Remaining Longevity: 46 mo
Battery Remaining Percentage: 45 %
Battery Voltage: 2.95 V
Brady Statistic AP VP Percent: 2.2 %
Brady Statistic AP VS Percent: 28 %
Brady Statistic AS VP Percent: 1 %
Brady Statistic AS VS Percent: 69 %
Brady Statistic RA Percent Paced: 29 %
Brady Statistic RV Percent Paced: 3.7 %
Date Time Interrogation Session: 20230623020017
HighPow Impedance: 56 Ohm
HighPow Impedance: 56 Ohm
Implantable Lead Implant Date: 20141205
Implantable Lead Implant Date: 20141205
Implantable Lead Location: 753859
Implantable Lead Location: 753860
Implantable Lead Model: 7121
Implantable Pulse Generator Implant Date: 20171221
Lead Channel Impedance Value: 450 Ohm
Lead Channel Impedance Value: 530 Ohm
Lead Channel Pacing Threshold Amplitude: 0.5 V
Lead Channel Pacing Threshold Amplitude: 1 V
Lead Channel Pacing Threshold Pulse Width: 0.5 ms
Lead Channel Pacing Threshold Pulse Width: 0.5 ms
Lead Channel Sensing Intrinsic Amplitude: 12 mV
Lead Channel Sensing Intrinsic Amplitude: 3.8 mV
Lead Channel Setting Pacing Amplitude: 2 V
Lead Channel Setting Pacing Amplitude: 2.5 V
Lead Channel Setting Pacing Pulse Width: 0.5 ms
Lead Channel Setting Sensing Sensitivity: 0.5 mV
Pulse Gen Serial Number: 7387049

## 2022-03-11 NOTE — Progress Notes (Signed)
Remote ICD transmission.   

## 2022-03-23 DIAGNOSIS — M5137 Other intervertebral disc degeneration, lumbosacral region: Secondary | ICD-10-CM | POA: Diagnosis not present

## 2022-03-23 DIAGNOSIS — M5459 Other low back pain: Secondary | ICD-10-CM | POA: Diagnosis not present

## 2022-03-23 DIAGNOSIS — M542 Cervicalgia: Secondary | ICD-10-CM | POA: Diagnosis not present

## 2022-03-23 DIAGNOSIS — R293 Abnormal posture: Secondary | ICD-10-CM | POA: Diagnosis not present

## 2022-03-23 DIAGNOSIS — M9901 Segmental and somatic dysfunction of cervical region: Secondary | ICD-10-CM | POA: Diagnosis not present

## 2022-03-23 DIAGNOSIS — M9903 Segmental and somatic dysfunction of lumbar region: Secondary | ICD-10-CM | POA: Diagnosis not present

## 2022-03-23 DIAGNOSIS — M791 Myalgia, unspecified site: Secondary | ICD-10-CM | POA: Diagnosis not present

## 2022-03-23 DIAGNOSIS — M9902 Segmental and somatic dysfunction of thoracic region: Secondary | ICD-10-CM | POA: Diagnosis not present

## 2022-03-23 DIAGNOSIS — M50222 Other cervical disc displacement at C5-C6 level: Secondary | ICD-10-CM | POA: Diagnosis not present

## 2022-03-24 ENCOUNTER — Other Ambulatory Visit (HOSPITAL_COMMUNITY): Payer: Self-pay

## 2022-03-24 DIAGNOSIS — H8111 Benign paroxysmal vertigo, right ear: Secondary | ICD-10-CM | POA: Diagnosis not present

## 2022-03-24 DIAGNOSIS — Z95810 Presence of automatic (implantable) cardiac defibrillator: Secondary | ICD-10-CM | POA: Diagnosis not present

## 2022-03-24 DIAGNOSIS — I480 Paroxysmal atrial fibrillation: Secondary | ICD-10-CM | POA: Diagnosis not present

## 2022-03-24 DIAGNOSIS — Z7901 Long term (current) use of anticoagulants: Secondary | ICD-10-CM | POA: Diagnosis not present

## 2022-03-24 MED ORDER — SOTALOL HCL 120 MG PO TABS: ORAL_TABLET | ORAL | 1 refills | Status: DC

## 2022-03-30 ENCOUNTER — Encounter: Payer: Self-pay | Admitting: Cardiology

## 2022-03-30 ENCOUNTER — Telehealth: Payer: Self-pay

## 2022-03-30 DIAGNOSIS — M9902 Segmental and somatic dysfunction of thoracic region: Secondary | ICD-10-CM | POA: Diagnosis not present

## 2022-03-30 DIAGNOSIS — M5459 Other low back pain: Secondary | ICD-10-CM | POA: Diagnosis not present

## 2022-03-30 DIAGNOSIS — M50222 Other cervical disc displacement at C5-C6 level: Secondary | ICD-10-CM | POA: Diagnosis not present

## 2022-03-30 DIAGNOSIS — M9903 Segmental and somatic dysfunction of lumbar region: Secondary | ICD-10-CM | POA: Diagnosis not present

## 2022-03-30 DIAGNOSIS — M9901 Segmental and somatic dysfunction of cervical region: Secondary | ICD-10-CM | POA: Diagnosis not present

## 2022-03-30 DIAGNOSIS — M542 Cervicalgia: Secondary | ICD-10-CM | POA: Diagnosis not present

## 2022-03-30 DIAGNOSIS — M5137 Other intervertebral disc degeneration, lumbosacral region: Secondary | ICD-10-CM | POA: Diagnosis not present

## 2022-03-30 DIAGNOSIS — M791 Myalgia, unspecified site: Secondary | ICD-10-CM | POA: Diagnosis not present

## 2022-03-30 DIAGNOSIS — R293 Abnormal posture: Secondary | ICD-10-CM | POA: Diagnosis not present

## 2022-03-30 NOTE — Telephone Encounter (Signed)
Device alert for AF ongoing from 7/15 @ 20:59 Hx of AF, burden 4.3%, Eliquis,  1/5 PVI  Outreach made to Pt.  Per Pt he has been a little short of breath, but he thought he was just "out of shape".  Will forward to Dr. Quentin Ore for review.

## 2022-03-31 ENCOUNTER — Ambulatory Visit: Payer: Medicare Other | Attending: Family Medicine

## 2022-03-31 DIAGNOSIS — R42 Dizziness and giddiness: Secondary | ICD-10-CM | POA: Insufficient documentation

## 2022-03-31 DIAGNOSIS — H8111 Benign paroxysmal vertigo, right ear: Secondary | ICD-10-CM | POA: Diagnosis not present

## 2022-03-31 NOTE — Therapy (Signed)
OUTPATIENT PHYSICAL THERAPY VESTIBULAR EVALUATION     Patient Name: Joseph Morrow MRN: [id number] DOB:07-Sep-1946, 76 y.o., male Today's Date: 03/31/2022  PCP: Ginger Organ. MD REFERRING PROVIDER: Janus Molder, NP   PT End of Session - 03/31/22 1215     Visit Number 1    Number of Visits 5    Date for PT Re-Evaluation 05/01/22    Authorization Type Medicare & BCBS    Progress Note Due on Visit 10    PT Start Time 1230    PT Stop Time 1312    PT Time Calculation (min) 42 min    Activity Tolerance Patient tolerated treatment well    Behavior During Therapy WFL for tasks assessed/performed             Past Medical History:  Diagnosis Date   Acute cholecystitis 06/21/2011   Allergy    SEASONAL   Cataract    Complication of anesthesia    HALLUCINATIONS   ED (erectile dysfunction)    GERD (gastroesophageal reflux disease)    Hearing loss    Helicobacter pylori gastritis 06/2014   Hemorrhoids    Hiatal hernia    Hx of adenomatous colonic polyps 06/2014   Hypertrophic cardiomyopathy (HCC)    Mitral regurgitation    Moderate   Osteopenia    Paroxysmal atrial fibrillation ([place]) 04/2015   documented on ICD remote interrogation, treated with sotalol   Septal defect    LV Septal hyptertrophy 65m, normal EF, minimal LVOT gradiant.   Tubular adenoma of colon    [place] tachycardia) ([place] 08/18/2013   s/p St. Jude ICD   Past Surgical History:  Procedure Laterality Date   APPENDECTOMY  1950   ATRIAL FIBRILLATION ABLATION N/A 07/04/2020   Procedure: ATRIAL FIBRILLATION ABLATION;  Surgeon: AThompson Grayer MD;  [facility];  Service: Cardiovascular;  Laterality: N/A;   ATRIAL FIBRILLATION ABLATION N/A 09/18/2021   Procedure: ATRIAL FIBRILLATION ABLATION;  Surgeon: LVickie Epley MD;  [facility];  Service: Cardiovascular;  Laterality: N/A;   CARDIOVERSION N/A 02/25/2017   Procedure: CARDIOVERSION;  Surgeon: RFay Records MD;  [facility];  Service: Cardiovascular;  Laterality: N/A;   CARDIOVERSION N/A 07/30/2021   Procedure: CARDIOVERSION;  Surgeon: BJanina Mayo MD;  Location: [facility]  Service: Cardiovascular;  Laterality: N/A;   CATARACT EXTRACTION, BILATERAL     CHOLECYSTECTOMY  06/24/2011   COLONOSCOPY     EP IMPLANTABLE DEVICE N/A 09/03/2016   SJM Fortify Assura Dr generator change by Dr ARayann Heman  ICD implanted for secondary prevention of sudden death   IMPLANTABLE CARDIOVERTER DEFIBRILLATOR IMPLANT  08/18/2013   St. Jude Fortify Assura DR ICD implanted by Dr ARayann Hemanfor secondary treatment of VT   IMPLANTABLE CARDIOVERTER DEFIBRILLATOR IMPLANT N/A 08/18/2013   Procedure: IMPLANTABLE CARDIOVERTER DEFIBRILLATOR IMPLANT;  Surgeon: JCoralyn Mark MD;  [facility];  Service: Cardiovascular;  Laterality: N/A;   TEE WITHOUT CARDIOVERSION N/A 07/30/2021   Procedure: TRANSESOPHAGEAL ECHOCARDIOGRAM (TEE);  Surgeon: BJanina Mayo MD;  [facility];  Service: Cardiovascular;  Laterality: N/A;   Patient Active Problem List   Diagnosis Date Noted   Atrial fibrillation ([place] 02/23/2017   Rupture of distal biceps tendon, left, initial encounter 12/23/2016   Multiple lung nodules on CT 03/20/2016   Cough 03/10/2016   Nausea 11/22/2015   PAF (paroxysmal atrial fibrillation) ([place] 08/20/2015   Hx of colonic polyps 05/14/2014   ICD (implantable cardioverter-defibrillator) in  place 09/04/2013   VT (ventricular tachycardia) ([place]) 09/04/2013   V tach ([place]) 08/18/2013   Hypertrophic cardiomyopathy (HCC)    Heart murmur    Mitral regurgitation    GERD (gastroesophageal reflux disease)     ONSET DATE: 03/26/22  REFERRING DIAG: H81.11 (ICD-10-CM) - Benign paroxysmal vertigo, right ear  THERAPY DIAG:  Dizziness and giddiness  BPPV (benign paroxysmal positional vertigo), right  Rationale for Evaluation and Treatment Rehabilitation  SUBJECTIVE:   SUBJECTIVE  STATEMENT: Patient reports about a month ago had a severe attack where the room was spinning. Reports it has still been present. Reports he has been nausea and vomiting when he has tried the maneuver. Reports it is very brief. Patient reports it is currently in A-Fib. No falls due to the dizziness. Has been unable to take the nausea medication due to the side effects. Patient denies vision changes. Has hearing deficits at baseline. Reports he does have some diplopia, was present prior to vertigo onset.  Pt accompanied by: self  PERTINENT HISTORY: Acute Cholecystitis, Cataracts, GERD, Hearing Loss, Hypertrophic Cardiomyopathy, Osteopenia, AFib, VT, Impanted Defibrillator   PAIN:  Are you having pain? No  PRECAUTIONS: ICD/Pacemaker  WEIGHT BEARING RESTRICTIONS No  FALLS: Has patient fallen in last 6 months? No  LIVING ENVIRONMENT: Lives with: lives with their spouse Lives in: House/apartment  PLOF: Independent  PATIENT GOALS Improve the Dizziness.  OBJECTIVE:   COGNITION: Overall cognitive status: Within functional limits for tasks assessed   SENSATION: WFL  POSTURE: No Significant postural limitations  STRENGTH: WFL  TRANSFERS: Assistive device utilized: None  Sit to stand: Complete Independence Stand to sit: Complete Independence  GAIT: Gait pattern: WFL Assistive device utilized: None Level of assistance: Complete Independence   PATIENT SURVEYS:  FOTO DPS: 54% and DFS: 60.4%   VESTIBULAR ASSESSMENT    SYMPTOM BEHAVIOR:   Subjective history: See Subjective   Non-Vestibular symptoms: nausea/vomiting; slight nausea sensation   Type of dizziness: Spinning/Vertigo and Unsteady with head/body turns   Frequency: daily   Duration: brief seconds   Aggravating factors: Induced by position change: lying supine, rolling to the right, and rolling to the left   Relieving factors: rest and slow movements   Progression of symptoms: better   OCULOMOTOR EXAM:   Ocular  Alignment: normal   Ocular ROM: No Limitations   Spontaneous Nystagmus: absent   Gaze-Induced Nystagmus: absent   Smooth Pursuits: intact   Saccades: intact   VESTIBULAR - OCULAR REFLEX:    Slow VOR: Normal; mild dizziness/sensitivity to the movement   Head-Impulse Test: HIT Right: negative HIT Left: negative; mild dizziness    Dynamic Visual Acuity: Static: 7 Dynamic: 7 ; mild dizziness    POSITIONAL TESTING: Right Dix-Hallpike: no nystagmus Left Dix-Hallpike: no nystagmus Right Roll Test: no nystagmus Left Roll Test: apogeotropic nystagmus; mild dizziness    MOTION SENSITIVITY:    Motion Sensitivity Quotient  Intensity: 0 = none, 1 = Lightheaded, 2 = Mild, 3 = Moderate, 4 = Severe, 5 = Vomiting  Intensity  1. Sitting to supine 0  2. Supine to L side 2  3. Supine to R side 2  4. Supine to sitting 1  5. L Hallpike-Dix 0  6. Up from L  1  7. R Hallpike-Dix 0  8. Up from R  1  9. Sitting, head  tipped to L knee   10. Head up from L  knee   11. Sitting, head  tipped to R knee   12. Head  up from R  knee   13. Sitting head turns x5   14.Sitting head nods x5   15. In stance, 180  turn to L    16. In stance, 180  turn to R       VESTIBULAR TREATMENT:  Canalith Repositioning:   BBQ Roll Right: Number of Reps: 1 and Response to Treatment: symptoms resolved   PATIENT EDUCATION: Education details: Educated on [employer]; BPPV Person educated: Patient Education method: Explanation Education comprehension: verbalized understanding   GOALS: Goals reviewed with patient? Yes  SHORT TERM GOALS = LONG TERM GOALS  LONG TERM GOALS: Target date:  05/01/2022     Patient will demonstrate (-) positional testing to indicate resolution of BPPV Baseline: R BPPV Goal status: INITIAL  2.  Pt will verbalize understanding of self treatment for BPPV in case of reoccurrence Baseline: dependent Goal status: INITIAL  3.  Pt will improve DPS to >/= 61% Baseline:  54%  Goal status: INITIAL   ASSESSMENT:  CLINICAL IMPRESSION: Patient is a 76 y.o. male referred to Neuro OPPT services for Vertigo/BPPV. Patient's PMH significant for the following: Acute Cholecystitis, Cataracts, GERD, Hearing Loss, Hypertrophic Cardiomyopathy, Osteopenia, AFib, VT, Impanted Defibrillator . Upon evaluation, patient presents with the following impairments: dizziness and Ageotropic Nystagmus with Roll Test (Increased intensity on L, indicating R Horizontal Canal Involvement). Completed CRM x 1 Rep with resolution of symptoms. Patient will benefit from skilled PT services to address impairments.     OBJECTIVE IMPAIRMENTS decreased activity tolerance and dizziness.   ACTIVITY LIMITATIONS bed mobility  PARTICIPATION LIMITATIONS: cleaning and yard work  [place] Age and 3+ comorbidities: Acute Cholecystitis, Cataracts, GERD, Hearing Loss, Hypertrophic Cardiomyopathy, Osteopenia, AFib, VT, Impanted Defibrillator  are also affecting patient's functional outcome.   REHAB POTENTIAL: Excellent  CLINICAL DECISION MAKING: Stable/uncomplicated  EVALUATION COMPLEXITY: Low   PLAN: PT FREQUENCY: 1x/week  PT DURATION: 4 weeks  PLANNED INTERVENTIONS: Therapeutic exercises, Therapeutic activity, Neuromuscular re-education, Balance training, Gait training, Patient/Family education, Self Care, Joint mobilization, Joint manipulation, Stair training, Vestibular training, Canalith repositioning, Cryotherapy, Moist heat, Manual therapy, and Re-evaluation  PLAN FOR NEXT SESSION: Reassess BPPV and treat as indicated. Teach home maneuver once resolved.    Kirtland Bouchard, PT, DPT 03/31/2022, 1:26 PM

## 2022-03-31 NOTE — Telephone Encounter (Signed)
MyChart message patient sent in:   Today I received a call from you office telling me that I ws in Afib and asking me if I knew I was in Afib. I said I did not know I was in Afib and that remains true.   Having said that though, there is a lot more to be told.   A little over a week ago I had an small vertigo attach. I have had these before and went for treatment. I made an appointment with Dr. Golda Acre PA Manuela Schwartz. She prescribed two medications. One for the Vertigo and one for nausea. I believe these medication gave me [place] constipation. I suffered with this condition Friday through Sunday. I do feel that the constipation put a strain on my heart.    At this point I have only been over this for about 24 hours.

## 2022-04-06 NOTE — Telephone Encounter (Signed)
Device alert for AF ongoing from 7/21, controlled rates Hx of AF, burden 10%, Eliquis,  Pt. to be set up with [facility], will plan DCCV if still in AF Route to triage for awareness [place]   Successful telephone encounter to patient to follow up on s/s of continued AF. Patient states he is still having symptoms. He is an established patient with the AF clinic however states he was not aware he could call to make his own appointments to be seen. Advised patient to call today and will route note over to AF clinic providers. Patient appreciative of follow up.

## 2022-04-07 ENCOUNTER — Ambulatory Visit: Payer: Medicare Other

## 2022-04-07 DIAGNOSIS — H8111 Benign paroxysmal vertigo, right ear: Secondary | ICD-10-CM | POA: Diagnosis not present

## 2022-04-07 DIAGNOSIS — R42 Dizziness and giddiness: Secondary | ICD-10-CM

## 2022-04-07 NOTE — Therapy (Signed)
OUTPATIENT PHYSICAL THERAPY VESTIBULAR TREATMENT NOTE   Patient Name: Joseph Morrow MRN: [id number] DOB:1945/10/20, 76 y.o., male Today's Date: 04/07/2022  PCP: Ginger Organ. MD REFERRING PROVIDER: Janus Molder, NP   PT End of Session - 04/07/22 1234     Visit Number 2    Number of Visits 5    Date for PT Re-Evaluation 05/01/22    Authorization Type Medicare & BCBS    Progress Note Due on Visit 10    PT Start Time 1233    PT Stop Time 1245    PT Time Calculation (min) 12 min    Activity Tolerance Patient tolerated treatment well    Behavior During Therapy WFL for tasks assessed/performed             Past Medical History:  Diagnosis Date   Acute cholecystitis 06/21/2011   Allergy    SEASONAL   Cataract    Complication of anesthesia    HALLUCINATIONS   ED (erectile dysfunction)    GERD (gastroesophageal reflux disease)    Hearing loss    Helicobacter pylori gastritis 06/2014   Hemorrhoids    Hiatal hernia    Hx of adenomatous colonic polyps 06/2014   Hypertrophic cardiomyopathy (HCC)    Mitral regurgitation    Moderate   Osteopenia    Paroxysmal atrial fibrillation ([place]) 04/2015   documented on ICD remote interrogation, treated with sotalol   Septal defect    LV Septal hyptertrophy 67m, normal EF, minimal LVOT gradiant.   Tubular adenoma of colon    VT (ventricular tachycardia) ([place] 08/18/2013   s/p St. Jude ICD   Past Surgical History:  Procedure Laterality Date   APPENDECTOMY  1950   ATRIAL FIBRILLATION ABLATION N/A 07/04/2020   Procedure: ATRIAL FIBRILLATION ABLATION;  Surgeon: AThompson Grayer MD;  [place] [facility];  Service: Cardiovascular;  Laterality: N/A;   ATRIAL FIBRILLATION ABLATION N/A 09/18/2021   Procedure: ATRIAL FIBRILLATION ABLATION;  Surgeon: LVickie Epley MD;  [place] [facility];  Service: Cardiovascular;  Laterality: N/A;   CARDIOVERSION N/A 02/25/2017   Procedure: CARDIOVERSION;  Surgeon: RFay Records MD;  [facility];  Service: Cardiovascular;  Laterality: N/A;   CARDIOVERSION N/A 07/30/2021   Procedure: CARDIOVERSION;  Surgeon: BJanina Mayo MD;  [place] [facility] [place]  Service: Cardiovascular;  Laterality: N/A;   CATARACT EXTRACTION, BILATERAL     CHOLECYSTECTOMY  06/24/2011   COLONOSCOPY     EP IMPLANTABLE DEVICE N/A 09/03/2016   SJM Fortify Assura Dr generator change by Dr ARayann Heman  ICD implanted for secondary prevention of sudden death   IMPLANTABLE CARDIOVERTER DEFIBRILLATOR IMPLANT  08/18/2013   St. Jude Fortify Assura DR ICD implanted by Dr ARayann Hemanfor secondary treatment of VT   IMPLANTABLE CARDIOVERTER DEFIBRILLATOR IMPLANT N/A 08/18/2013   Procedure: IMPLANTABLE CARDIOVERTER DEFIBRILLATOR IMPLANT;  Surgeon: JCoralyn Mark MD;  [facility];  Service: Cardiovascular;  Laterality: N/A;   TEE WITHOUT CARDIOVERSION N/A 07/30/2021   Procedure: TRANSESOPHAGEAL ECHOCARDIOGRAM (TEE);  Surgeon: BJanina Mayo MD;  [facility];  Service: Cardiovascular;  Laterality: N/A;   Patient Active Problem List   Diagnosis Date Noted   Atrial fibrillation ([place] 02/23/2017   Rupture of distal biceps tendon, left, initial encounter 12/23/2016   Multiple lung nodules on CT 03/20/2016   Cough 03/10/2016   Nausea 11/22/2015   PAF (paroxysmal atrial fibrillation) ([place] 08/20/2015   Hx of colonic polyps 05/14/2014   ICD (implantable cardioverter-defibrillator) in place  09/04/2013   VT (ventricular tachycardia) ([place]) 09/04/2013   V tach ([place]) 08/18/2013   Hypertrophic cardiomyopathy ([place])    Heart murmur    Mitral regurgitation    GERD (gastroesophageal reflux disease)     ONSET DATE: 03/26/22  REFERRING DIAG: H81.11 (ICD-10-CM) - Benign paroxysmal vertigo, right ear  THERAPY DIAG:  Dizziness and giddiness  BPPV (benign paroxysmal positional vertigo), right  Rationale for Evaluation and Treatment Rehabilitation  PERTINENT HISTORY: Acute  Cholecystitis, Cataracts, GERD, Hearing Loss, Hypertrophic Cardiomyopathy, Osteopenia, AFib, VT, Impanted Defibrillator  PRECAUTIONS: ICD/Pacemaker  SUBJECTIVE: No new changes/complaints. No episodes. Reports still in A-Fib.   PAIN:  Are you having pain? No  OBJECTIVE:   POSITIONAL TESTS:  Right Dix-Hallpike: no nystagmus Left Dix-Hallpike: no nystagmus Right Roll Test: no nystagmus Left Roll Test: no nystagmus   VESTIBULAR TREATMENT:  Canalith Repositioning: Comment: no positional testing warranted today  PT educating and demonstrating home maneuver in case of reoccurrence of BPPV. Patient verbalize understanding.     PATIENT EDUCATION: Education details: Human resources officer Person educated: Patient Education method: Explanation Education comprehension: verbalized understanding     GOALS: Goals reviewed with patient? Yes   SHORT TERM GOALS = LONG TERM GOALS   LONG TERM GOALS: Target date:  05/01/2022      Patient will demonstrate (-) positional testing to indicate resolution of BPPV Baseline: R BPPV Goal status: INITIAL   2.  Pt will verbalize understanding of self treatment for BPPV in case of reoccurrence Baseline: dependent Goal status: INITIAL   3.  Pt will improve DPS to >/= 61% Baseline: 54%  Goal status: INITIAL     ASSESSMENT:   CLINICAL IMPRESSION: Completed reassessment of positional testing, patient demonstrating no nystagmus/symptoms with any of the positional assessment today indicating continued resolution of BPPV. PT educating on home maneuver in case of reoccurrence . Patient verbalized understanding. Will plan to keep chart open for four weeks in case of reoccurrence, if does not return patient will be d/c from PT services.        OBJECTIVE IMPAIRMENTS decreased activity tolerance and dizziness.    ACTIVITY LIMITATIONS bed mobility   PARTICIPATION LIMITATIONS: cleaning and yard work   [place] Age and 3+ comorbidities: Acute  Cholecystitis, Cataracts, GERD, Hearing Loss, Hypertrophic Cardiomyopathy, Osteopenia, AFib, VT, Impanted Defibrillator  are also affecting patient's functional outcome.    REHAB POTENTIAL: Excellent   CLINICAL DECISION MAKING: Stable/uncomplicated   EVALUATION COMPLEXITY: Low     PLAN: PT FREQUENCY: 1x/week   PT DURATION: 4 weeks   PLANNED INTERVENTIONS: Therapeutic exercises, Therapeutic activity, Neuromuscular re-education, Balance training, Gait training, Patient/Family education, Self Care, Joint mobilization, Joint manipulation, Stair training, Vestibular training, Canalith repositioning, Cryotherapy, Moist heat, Manual therapy, and Re-evaluation   PLAN FOR NEXT SESSION: d/c in four weeks if no reoccurrence. If returns reassess BPPV and treat as indicated.    Kirtland Bouchard, PT, DPT 04/07/2022, 12:53 PM

## 2022-04-13 DIAGNOSIS — M50222 Other cervical disc displacement at C5-C6 level: Secondary | ICD-10-CM | POA: Diagnosis not present

## 2022-04-13 DIAGNOSIS — M9901 Segmental and somatic dysfunction of cervical region: Secondary | ICD-10-CM | POA: Diagnosis not present

## 2022-04-13 DIAGNOSIS — M542 Cervicalgia: Secondary | ICD-10-CM | POA: Diagnosis not present

## 2022-04-13 DIAGNOSIS — M9902 Segmental and somatic dysfunction of thoracic region: Secondary | ICD-10-CM | POA: Diagnosis not present

## 2022-04-13 DIAGNOSIS — M5459 Other low back pain: Secondary | ICD-10-CM | POA: Diagnosis not present

## 2022-04-13 DIAGNOSIS — M791 Myalgia, unspecified site: Secondary | ICD-10-CM | POA: Diagnosis not present

## 2022-04-13 DIAGNOSIS — R293 Abnormal posture: Secondary | ICD-10-CM | POA: Diagnosis not present

## 2022-04-13 DIAGNOSIS — M5137 Other intervertebral disc degeneration, lumbosacral region: Secondary | ICD-10-CM | POA: Diagnosis not present

## 2022-04-13 DIAGNOSIS — M9903 Segmental and somatic dysfunction of lumbar region: Secondary | ICD-10-CM | POA: Diagnosis not present

## 2022-04-20 ENCOUNTER — Encounter (HOSPITAL_COMMUNITY): Payer: Self-pay | Admitting: Nurse Practitioner

## 2022-04-20 ENCOUNTER — Ambulatory Visit (HOSPITAL_COMMUNITY)
Admission: RE | Admit: 2022-04-20 | Discharge: 2022-04-20 | Disposition: A | Discharge status: Home / Self Care | Payer: Medicare Other | Source: Ambulatory Visit | Attending: Nurse Practitioner | Admitting: Nurse Practitioner

## 2022-04-20 VITALS — BP 96/76 | HR 83 | Ht 70.0 in | Wt 184.0 lb

## 2022-04-20 DIAGNOSIS — I4892 Unspecified atrial flutter: Secondary | ICD-10-CM | POA: Diagnosis not present

## 2022-04-20 DIAGNOSIS — D6869 Other thrombophilia: Secondary | ICD-10-CM

## 2022-04-20 DIAGNOSIS — I484 Atypical atrial flutter: Secondary | ICD-10-CM

## 2022-04-20 DIAGNOSIS — I422 Other hypertrophic cardiomyopathy: Secondary | ICD-10-CM | POA: Insufficient documentation

## 2022-04-20 DIAGNOSIS — I480 Paroxysmal atrial fibrillation: Secondary | ICD-10-CM | POA: Diagnosis not present

## 2022-04-20 DIAGNOSIS — I4819 Other persistent atrial fibrillation: Secondary | ICD-10-CM

## 2022-04-20 DIAGNOSIS — Z7901 Long term (current) use of anticoagulants: Secondary | ICD-10-CM | POA: Diagnosis not present

## 2022-04-20 LAB — BASIC METABOLIC PANEL
Anion gap: 10 (ref 5–15)
BUN: 21 mg/dL (ref 8–23)
CO2: 25 mmol/L (ref 22–32)
Calcium: 9.4 mg/dL (ref 8.9–10.3)
Chloride: 104 mmol/L (ref 98–111)
Creatinine, Ser: 1.2 mg/dL (ref 0.61–1.24)
GFR, Estimated: >60 mL/min (ref >60)
Glucose, Bld: 135 mg/dL — ABNORMAL HIGH (ref 70–99)
Potassium: 4.2 mmol/L (ref 3.5–5.1)
Sodium: 139 mmol/L (ref 135–145)

## 2022-04-20 LAB — MAGNESIUM: Magnesium: 1.8 mg/dL (ref 1.7–2.4)

## 2022-04-20 NOTE — Patient Instructions (Signed)
Cardioversion scheduled for Monday, August 14th  - Arrive at the [facility] and go to admitting at 730am  - Do not eat or drink anything after midnight the night prior to your procedure.  - Take all your morning medication (except diabetic medications) with a sip of water prior to arrival.  - You will not be able to drive home after your procedure.  - Do NOT miss any doses of your blood thinner - if you should miss a dose please notify our office immediately.  - If you feel as if you go back into normal rhythm prior to scheduled cardioversion, please notify our office immediately. If your procedure is canceled in the cardioversion suite you will be charged a cancellation fee.

## 2022-04-20 NOTE — H&P (View-Only) (Signed)
Primary Care Physician: Ginger Organ., MD Referring Physician:Dr. Quentin Ore  EP: Dr. Lesia Sago Esco is a 76 y.o. male with a h/o hypertrophic cardiomyopathy, s/p ICD for VT, remote paroxysmal afib, maintaining SR with a very small afib burden on Sotalol. Hospitalized for sotalol administration 02/2017. Chadsvasc score of 2(age, LV dysfunction), continues on eliquis 5 mg bid.  Qtc stable.   He asked to be seen today for afib noted yesterday. Interrogation of ICD/PPM confirms afib on 2/10 and 2/11, mostly starting 2/10 at 11:54 pm and continuing to 5:03 am, 2/11. He states that he drinks alcohol nightly and he often takes his sotalol 14 hours apart. Otherwise, no change from his usual health. Denies snoring. He continues on sotalol.  F/u in afib clinic, 02/14/20. I am seeing him as the device clinic noted onset of atrial flutter, rate controlled since  5/30. In the office he continues in rate controlled aflutter. He is mildly symptomatic with this. He continues on sotalol 120 mg bid, and is compliant. He has 2 alcoholic drinks a week. He had been on BB in the past . Stopped when sotalol loaded. Continues on eliquis 5 mg bid for a CHA2DS2VASc score of 3.   F/u in afib clinic, 03/11/20, as an urgent work-in as he noted increase in HR last night via Fitbit and confirmed with [name]. If not for seeing his HR elevated, he would not have felt  differently out of rhythm. He is on sotalol 120 mg bid and has been on this for several years. He saw Dr. Rayann Heman in June , was doing ok, but it was mentioned in his note, that if afib burden increased, he could consider ablation.  No significant change in health recently. No alcohol, caffeine, tobacco.  By  by the device clinic report this am, he appears to be in an atrial flutter at 117 bpm. BP on arrival 88/70. With a glass of water, BP came up to 104/70. The only medicine he is on is 12.5 metoprolol succinate that would affect BP. This was added in  June on an occasion  when he was out of rhythm. He will usually convert himself.   F/u in afib clinic, 07/18/20. He called the clinic earlier in the week, that he was out of rhythm. He does report that he missed all meds Friday pm and went out of rhythm Saturday pm, including his DOAC. He is in an atrial  Flutter, rate controlled. Reading his ablation OP report, he did  have at least 3 flutter circuits that could not be mapped or ablated. No swallowing or groin issues.  F/u in afib clinic, 11/11. He has returned to [place]. Ekg shows sinus brady at 57 bpm, with first degree AV block.  PR int 236 ms, qrs int 66 ms, qtc 428 ms. He feels well.   F/u 08/02/20. He is now one month out from  ablation and has been in [place] for the last 2 weeks. He feels improved. No issues today.   F/u afib clinic 07/24/21. He went out of rhythm  over the weekend. He has noted more exertional dyspnea and fatigue with this episode. He had vertigo last week with dizziness and N/V with PCP treating with Antivert. It is now better. He has atrial flutter with RVR at 111 bpm.   F/u 08/04/21. He is in the clinic as he went out of rhythm yesterday with RVR at times. He hs noted some H/A at times since being  out of rhythm. He just had a successful cardioversion 11/16 but now with ERAF. His ablation was last October.  Because of hypertrophic cardiomyopathy, his best drug choice would be amiodarone if sotalol was stopped.   F/u 10/21/21 for one month s/p ablation. He is maintaining  SR. He feels well. He had a bruise left thigh but it is resolved . No swallowing issues. Remains on sotalol and metoprolol . No issues with anticoagultion.   F/u in the afib clinic, 04/20/22. He has had ongoing aflutter with controlled rates since 7/15 as notified by the device clinic. He is minimally symptomatic with this. No missed anticoagulation. Only trigger that pt  reports is that he developed vertigo and took several doses of meclizine, he developed severe  constipation after this, now resolved. He is in agreement to  schedule for cardioversion.  Today, he denies symptoms of   chest pain, shortness of breath, orthopnea, PND, lower extremity edema, dizziness, presyncope, syncope, or neurologic sequela.  Positive for palpitations..The patient is tolerating medications without difficulties and is otherwise without complaint today.   Past Medical History:  Diagnosis Date   Acute cholecystitis 06/21/2011   Allergy    SEASONAL   Cataract    Complication of anesthesia    HALLUCINATIONS   ED (erectile dysfunction)    GERD (gastroesophageal reflux disease)    Hearing loss    Helicobacter pylori gastritis 06/2014   Hemorrhoids    Hiatal hernia    Hx of adenomatous colonic polyps 06/2014   Hypertrophic cardiomyopathy (HCC)    Mitral regurgitation    Moderate   Osteopenia    Paroxysmal atrial fibrillation ([facility]) 04/2015   documented on ICD remote interrogation, treated with sotalol   Septal defect    LV Septal hyptertrophy 86m, normal EF, minimal LVOT gradiant.   Tubular adenoma of colon    VT (ventricular tachycardia) ([facility] 08/18/2013   s/p St. Jude ICD   Past Surgical History:  Procedure Laterality Date   APPENDECTOMY  1950   ATRIAL FIBRILLATION ABLATION N/A 07/04/2020   Procedure: ATRIAL FIBRILLATION ABLATION;  Surgeon: AThompson Grayer MD;  [facility];  Service: Cardiovascular;  Laterality: N/A;   ATRIAL FIBRILLATION ABLATION N/A 09/18/2021   Procedure: ATRIAL FIBRILLATION ABLATION;  Surgeon: LVickie Epley MD;  [facility];  Service: Cardiovascular;  Laterality: N/A;   CARDIOVERSION N/A 02/25/2017   Procedure: CARDIOVERSION;  Surgeon: RFay Records MD;  [facility];  Service: Cardiovascular;  Laterality: N/A;   CARDIOVERSION N/A 07/30/2021   Procedure: CARDIOVERSION;  Surgeon: BJanina Mayo MD;  [facility]  Service: Cardiovascular;  Laterality: N/A;   CATARACT  EXTRACTION, BILATERAL     CHOLECYSTECTOMY  06/24/2011   COLONOSCOPY     EP IMPLANTABLE DEVICE N/A 09/03/2016   [facility] Fortify [address] Dr generator change by Dr ARayann Heman  ICD implanted for secondary prevention of sudden death   IMPLANTABLE CARDIOVERTER DEFIBRILLATOR IMPLANT  08/18/2013   St. Jude Fortify Ass[address] DR ICD implanted by Dr ARayann Hemanfor secondary treatment of VT   IMPLANTABLE CARDIOVERTER DEFIBRILLATOR IMPLANT N/A 08/18/2013   Procedure: IMPLANTABLE CARDIOVERTER DEFIBRILLATOR IMPLANT;  Surgeon: JCoralyn Mark MD;  [facility];  Service: Cardiovascular;  Laterality: N/A;   TEE WITHOUT CARDIOVERSION N/A 07/30/2021   Procedure: TRANSESOPHAGEAL ECHOCARDIOGRAM (TEE);  Surgeon: BJanina Mayo MD;  [facility];  Service: Cardiovascular;  Laterality: N/A;    Current Outpatient Medications  Medication Sig Dispense Refill   acetaminophen (TYLENOL)  500 MG tablet Take 500-1,000 mg by mouth every 6 (six) hours as needed (pain.).     apixaban (ELIQUIS) 5 MG TABS tablet TAKE 1 TABLET TWICE A DAY 60 tablet 2   calcium carbonate (OSCAL) 1500 (600 Ca) MG TABS tablet Take 600 mg by mouth in the morning and at bedtime.     calcium carbonate (TUMS EX) 750 MG chewable tablet Chew 1 tablet by mouth 3 (three) times daily as needed for heartburn.     cholecalciferol (VITAMIN D) 25 MCG (1000 UNIT) tablet Take 1,000 Units by mouth in the morning and at bedtime.     Coenzyme Q10 (CO Q 10) 100 MG CAPS Take 100 mg by mouth in the morning.     colchicine 0.6 MG tablet Take 1 tablet (0.6 mg total) by mouth 2 (two) times daily. 10 tablet 0   diltiazem (CARDIZEM) 30 MG tablet Cardizem '30mg'$  -- take 1 tablet every 4 hours AS NEEDED for heart rate >100 30 tablet 3   furosemide (LASIX) 20 MG tablet Take 1 tablet by mouth daily until 11/16 then only as needed for swelling/weight gain (Patient not taking: Reported on 12/19/2021) 30 tablet 11   Glucosamine-Chondroitin (COSAMIN DS PO) Take 1 tablet by mouth in  the morning.     metoprolol succinate (TOPROL-XL) 50 MG 24 hr tablet TAKE 1 TABLET TWICE A DAY WITH OR IMMEDIATELY FOLLOWING A MEAL 180 tablet 3   omeprazole (PRILOSEC OTC) 20 MG tablet Take 10 mg by mouth in the morning.     pantoprazole (PROTONIX) 40 MG tablet Take 1 tablet (40 mg total) by mouth daily. Patient will take for 45 days post ablation then stop (Patient not taking: Reported on 11/03/2021) 45 tablet 0   potassium citrate (UROCIT-K) 10 MEQ (1080 MG) SR tablet Take 1 tablet (10 mEq total) by mouth daily as needed (with lasix (swelling/weight gain)). (Patient not taking: Reported on 12/19/2021) 30 tablet 11   Probiotic Product (ALIGN PO) Take 1 capsule by mouth in the morning.     sotalol (BETAPACE) 120 MG tablet TAKE 1 TABLET(120 MG) BY MOUTH TWICE DAILY 180 tablet 1   Zinc 50 MG CAPS Take 50 mg by mouth 2 (two) times daily.      No current facility-administered medications for this encounter.    Allergies  Allergen Reactions   Dextromethorphan Other (See Comments)    nervous   Versed [Midazolam] Other (See Comments)    "hallucinations, headache"   Antihistamines, Diphenhydramine-Type Anxiety    Social History   Socioeconomic History   Marital status: Married    Spouse name: Not on file   Number of children: 2   Years of education: Not on file   Highest education level: Not on file  Occupational History   Occupation: retired  Tobacco Use   Smoking status: Former    Packs/day: 2.00    Years: 20.00    Total pack years: 40.00    Types: Cigarettes    Quit date: 07/06/1981    Years since quitting: 40.8   Smokeless tobacco: Never   Tobacco comments:    Former smoker 10/21/21  Vaping Use   Vaping Use: Never used  Substance and Sexual Activity   Alcohol use: Not Currently    Comment: stop drinking 2 glasses of wine since ablation 10/21/21   Drug use: No   Sexual activity: Not on file  Other Topics Concern   Not on file  Social History Narrative   Originally from  [place]. Moved to [place] in 2012. Currently has no pets. No bird or hot tub exposure. 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 He did have exposure to Beneze, Acetone, Styrene, & Hydrocarbon early in his career.   Social Determinants of Health   Financial Resource Strain: Not on file  Food Insecurity: Not on file  Transportation Needs: Not on file  Physical Activity: Not on file  Stress: Not on file  Social Connections: Not on file  Intimate Partner Violence: Not on file    Family History  Problem Relation Age of Onset   Hypertrophic cardiomyopathy Mother    Arrhythmia Mother    Heart failure Mother    Pneumonia Father    Diabetes Father    Colon polyps Father    Heart attack Brother    Sleep apnea Brother    Hypertrophic cardiomyopathy Brother    Heart Problems Brother        VAD   Arrhythmia Brother    Heart Problems Brother    Diabetes Brother    Sudden death Brother    Lung disease Neg Hx     ROS- All systems are reviewed and negative except as per the HPI above  Physical Exam: There were no vitals filed for this visit.   Wt Readings from Last 3 Encounters:  12/19/21 82.1 kg  11/03/21 80.6 kg  10/21/21 80.3 kg    Labs: Lab Results  Component Value Date   NA 137 10/10/2021   K 3.9 10/10/2021   CL 102 10/10/2021   CO2 27 10/10/2021   GLUCOSE 114 (H) 10/10/2021   BUN 12 10/10/2021   CREATININE 1.10 10/10/2021   CALCIUM 9.0 10/10/2021   MG 2.1 07/24/2021   Lab Results  Component Value Date   INR 1.2 10/10/2021   No results found for: "CHOL", "HDL", "LDLCALC", "TRIG"   GEN- The patient is well appearing, alert and oriented x 3 today.   Head- normocephalic, atraumatic Eyes-  Sclera clear, conjunctiva pink Ears- hearing intact Oropharynx- clear Neck- supple, no JVP Lymph- no cervical lymphadenopathy Lungs- Clear to ausculation bilaterally, normal work of breathing Heart regular rate and rhythm, no murmurs, rubs or  gallops, PMI not laterally displaced GI- soft, NT, ND, + BS Extremities- no clubbing, cyanosis, or edema MS- no significant deformity or atrophy Skin- no rash or lesion Psych- euthymic mood, full affect Neuro- strength and sensation are intact  EKG-  Vent. rate 83 BPM PR interval 222 ms QRS duration 72 ms QT/QTcB 382/448 ms P-R-T axes * 106 251 Sinus rhythm with 1st degree A-V block Rightward axis Marked ST abnormality, possible inferior subendocardial injury Abnormal ECG When compared with ECG of 21-Oct-2021 11:52, PREVIOUS ECG IS PRESENT   1. Hypertrophic cardiomyopathy, sigmoid subtype. Basal septum measures 18  mm in maximal dimension. Flow acceleration noted at the LVOT, no LVOT  obstruction noted. Chordal systolic anterior motion of the mitral valve  without involvment of the anterior  leaflet.   2. Left ventricular ejection fraction, by visual estimation, is 60 to  65%. The left ventricle has normal function. There is severely increased  left ventricular hypertrophy.   3. Elevated left atrial and left ventricular end-diastolic pressures.   4. Left ventricular diastolic parameters are consistent with Grade III  diastolic dysfunction (restrictive).   5. Global right ventricle has normal systolic function.The right  ventricular size is normal. No increase in right ventricular wall  thickness.   6. Left atrial size was  moderately dilated.   7. Right atrial size was moderately dilated.   8. The mitral valve is normal in structure. Mild mitral valve  regurgitation. No evidence of mitral stenosis.   9. The tricuspid valve is normal in structure. Tricuspid valve  regurgitation is mild.  10. The aortic valve is tricuspid. Aortic valve regurgitation is not  visualized. No evidence of aortic valve sclerosis or stenosis.  11. The pulmonic valve was normal in structure. Pulmonic valve  regurgitation is mild.  12. Moderately elevated pulmonary artery systolic pressure.  13. The  inferior vena cava is dilated in size with <50% respiratory  variability, suggesting right atrial pressure of 15 mmHg.  14. The left ventricle has no regional wall motion abnormalities.  Echo- 2020----------------------------------------------------------------- Study Conclusions   - Left ventricle: The cavity size was normal. There was moderate   asymmetric hypertrophy of the septum. The appearance was   consistent with hypertrophic cardiomyopathy. Systolic function   was vigorous. The estimated ejection fraction was in the range of   65% to 70%. There was no dynamic obstruction. Wall motion was   normal; there were no regional wall motion abnormalities. - Mitral valve: There was mild to moderate regurgitation directed   centrally. - Left atrium: The atrium was severely dilated. - Right ventricle: The cavity size was mildly dilated. - Right atrium: The atrium was mildly dilated. - Tricuspid valve: There was moderate regurgitation. - Pulmonary arteries: Systolic pressure was mildly increased. PA   peak pressure: 41 mm Hg (S).   Assessment and Plan: 1. Atrial  flutter  S/p  ablation 06/2020  Had a redo ablation 09/2021   Was maintaining SR until mid July and PPM has shown ongoing atrial flutter, rate controlled, minimally symptomatic   He would like to pursue cardioversion He will continue on sotalol 120 mg bid and metoprolol 50 mg bid  Continue eliquis 5 mg bid for a CHA2DS2VASc score of at least 3, he states no missed doses for at least 3 weeks   2. HCM Stable  St Jude -[employer]lin ICD Per Dr. Quentin Ore   F/u with  afib clinic one week after cardioversion   Butch Penny C. Lesia Monica, [facility] [address] [place],  [address] [phone]

## 2022-04-20 NOTE — Progress Notes (Signed)
Primary Care Physician: Ginger Organ., MD Referring Physician:Dr. Quentin Ore  EP: Dr. Lesia Sago Joseph Morrow is a 75 y.o. male with a h/o hypertrophic cardiomyopathy, s/p ICD for VT, remote paroxysmal afib, maintaining SR with a very small afib burden on Sotalol. Hospitalized for sotalol administration 02/2017. Chadsvasc score of 2(age, LV dysfunction), continues on eliquis 5 mg bid.  Qtc stable.   He asked to be seen today for afib noted yesterday. Interrogation of ICD/PPM confirms afib on 2/10 and 2/11, mostly starting 2/10 at 11:54 pm and continuing to 5:03 am, 2/11. He states that he drinks alcohol nightly and he often takes his sotalol 14 hours apart. Otherwise, no change from his usual health. Denies snoring. He continues on sotalol.  F/u in afib clinic, 02/14/20. I am seeing him as the device clinic noted onset of atrial flutter, rate controlled since  5/30. In the office he continues in rate controlled aflutter. He is mildly symptomatic with this. He continues on sotalol 120 mg bid, and is compliant. He has 2 alcoholic drinks a week. He had been on BB in the past . Stopped when sotalol loaded. Continues on eliquis 5 mg bid for a CHA2DS2VASc score of 3.   F/u in afib clinic, 03/11/20, as an urgent work-in as he noted increase in HR last night via Fitbit and confirmed with [name]. If not for seeing his HR elevated, he would not have felt  differently out of rhythm. He is on sotalol 120 mg bid and has been on this for several years. He saw Dr. Rayann Heman in June , was doing ok, but it was mentioned in his note, that if afib burden increased, he could consider ablation.  No significant change in health recently. No alcohol, caffeine, tobacco.  By  by the device clinic report this am, he appears to be in an atrial flutter at 117 bpm. BP on arrival 88/70. With a glass of water, BP came up to 104/70. The only medicine he is on is 12.5 metoprolol succinate that would affect BP. This was added in  June on an occasion  when he was out of rhythm. He will usually convert himself.   F/u in afib clinic, 07/18/20. He called the clinic earlier in the week, that he was out of rhythm. He does report that he missed all meds Friday pm and went out of rhythm Saturday pm, including his DOAC. He is in an atrial  Flutter, rate controlled. Reading his ablation OP report, he did  have at least 3 flutter circuits that could not be mapped or ablated. No swallowing or groin issues.  F/u in afib clinic, 11/11. He has returned to [place]. Ekg shows sinus brady at 57 bpm, with first degree AV block.  PR int 236 ms, qrs int 66 ms, qtc 428 ms. He feels well.   F/u 08/02/20. He is now one month out from  ablation and has been in [place] for the last 2 weeks. He feels improved. No issues today.   F/u afib clinic 07/24/21. He went out of rhythm  over the weekend. He has noted more exertional dyspnea and fatigue with this episode. He had vertigo last week with dizziness and N/V with PCP treating with Antivert. It is now better. He has atrial flutter with RVR at 111 bpm.   F/u 08/04/21. He is in the clinic as he went out of rhythm yesterday with RVR at times. He hs noted some H/A at times since being  out of rhythm. He just had a successful cardioversion 11/16 but now with ERAF. His ablation was last October.  Because of hypertrophic cardiomyopathy, his best drug choice would be amiodarone if sotalol was stopped.   F/u 10/21/21 for one month s/p ablation. He is maintaining  SR. He feels well. He had a bruise left thigh but it is resolved . No swallowing issues. Remains on sotalol and metoprolol . No issues with anticoagultion.   F/u in the afib clinic, 04/20/22. He has had ongoing aflutter with controlled rates since 7/15 as notified by the device clinic. He is minimally symptomatic with this. No missed anticoagulation. Only trigger that pt  reports is that he developed vertigo and took several doses of meclizine, he developed severe  constipation after this, now resolved. He is in agreement to  schedule for cardioversion.  Today, he denies symptoms of   chest pain, shortness of breath, orthopnea, PND, lower extremity edema, dizziness, presyncope, syncope, or neurologic sequela.  Positive for palpitations..The patient is tolerating medications without difficulties and is otherwise without complaint today.   Past Medical History:  Diagnosis Date   Acute cholecystitis 06/21/2011   Allergy    SEASONAL   Cataract    Complication of anesthesia    HALLUCINATIONS   ED (erectile dysfunction)    GERD (gastroesophageal reflux disease)    Hearing loss    Helicobacter pylori gastritis 06/2014   Hemorrhoids    Hiatal hernia    Hx of adenomatous colonic polyps 06/2014   Hypertrophic cardiomyopathy (HCC)    Mitral regurgitation    Moderate   Osteopenia    Paroxysmal atrial fibrillation ([place]) 04/2015   documented on ICD remote interrogation, treated with sotalol   Septal defect    LV Septal hyptertrophy 80m, normal EF, minimal LVOT gradiant.   Tubular adenoma of colon    VT ([place]ricular tachycardia) ([place] 08/18/2013   s/p St. Jude ICD   Past Surgical History:  Procedure Laterality Date   APPENDECTOMY  1950   ATRIAL FIBRILLATION ABLATION N/A 07/04/2020   Procedure: ATRIAL FIBRILLATION ABLATION;  Surgeon: AThompson Grayer MD;  [facility];  Service: Cardiovascular;  Laterality: N/A;   ATRIAL FIBRILLATION ABLATION N/A 09/18/2021   Procedure: ATRIAL FIBRILLATION ABLATION;  Surgeon: LVickie Epley MD;  [facility];  Service: Cardiovascular;  Laterality: N/A;   CARDIOVERSION N/A 02/25/2017   Procedure: CARDIOVERSION;  Surgeon: RFay Records MD;  [facility];  Service: Cardiovascular;  Laterality: N/A;   CARDIOVERSION N/A 07/30/2021   Procedure: CARDIOVERSION;  Surgeon: BJanina Mayo MD;  [facility]  Service: Cardiovascular;  Laterality: N/A;   CATARACT  EXTRACTION, BILATERAL     CHOLECYSTECTOMY  06/24/2011   COLONOSCOPY     EP IMPLANTABLE DEVICE N/A 09/03/2016   SJM Fortify [address] Dr generator change by Dr ARayann Heman  ICD implanted for secondary prevention of sudden death   IMPLANTABLE CARDIOVERTER DEFIBRILLATOR IMPLANT  08/18/2013   St. Jude Fortify [address] DR ICD implanted by Dr ARayann Hemanfor secondary treatment of VT   IMPLANTABLE CARDIOVERTER DEFIBRILLATOR IMPLANT N/A 08/18/2013   Procedure: IMPLANTABLE CARDIOVERTER DEFIBRILLATOR IMPLANT;  Surgeon: JCoralyn Mark MD;  [facility];  Service: Cardiovascular;  Laterality: N/A;   TEE WITHOUT CARDIOVERSION N/A 07/30/2021   Procedure: TRANSESOPHAGEAL ECHOCARDIOGRAM (TEE);  Surgeon: BJanina Mayo MD;  [facility];  Service: Cardiovascular;  Laterality: N/A;    Current Outpatient Medications  Medication Sig Dispense Refill   acetaminophen (TYLENOL)  500 MG tablet Take 500-1,000 mg by mouth every 6 (six) hours as needed (pain.).     apixaban (ELIQUIS) 5 MG TABS tablet TAKE 1 TABLET TWICE A DAY 60 tablet 2   calcium carbonate (OSCAL) 1500 (600 Ca) MG TABS tablet Take 600 mg by mouth in the morning and at bedtime.     calcium carbonate (TUMS EX) 750 MG chewable tablet Chew 1 tablet by mouth 3 (three) times daily as needed for heartburn.     cholecalciferol (VITAMIN D) 25 MCG (1000 UNIT) tablet Take 1,000 Units by mouth in the morning and at bedtime.     Coenzyme Q10 (CO Q 10) 100 MG CAPS Take 100 mg by mouth in the morning.     colchicine 0.6 MG tablet Take 1 tablet (0.6 mg total) by mouth 2 (two) times daily. 10 tablet 0   diltiazem (CARDIZEM) 30 MG tablet Cardizem '30mg'$  -- take 1 tablet every 4 hours AS NEEDED for heart rate >100 30 tablet 3   furosemide (LASIX) 20 MG tablet Take 1 tablet by mouth daily until 11/16 then only as needed for swelling/weight gain (Patient not taking: Reported on 12/19/2021) 30 tablet 11   Glucosamine-Chondroitin (COSAMIN DS PO) Take 1 tablet by mouth in  the morning.     metoprolol succinate (TOPROL-XL) 50 MG 24 hr tablet TAKE 1 TABLET TWICE A DAY WITH OR IMMEDIATELY FOLLOWING A MEAL 180 tablet 3   omeprazole (PRILOSEC OTC) 20 MG tablet Take 10 mg by mouth in the morning.     pantoprazole (PROTONIX) 40 MG tablet Take 1 tablet (40 mg total) by mouth daily. Patient will take for 45 days post ablation then stop (Patient not taking: Reported on 11/03/2021) 45 tablet 0   potassium citrate (UROCIT-K) 10 MEQ (1080 MG) SR tablet Take 1 tablet (10 mEq total) by mouth daily as needed (with lasix (swelling/weight gain)). (Patient not taking: Reported on 12/19/2021) 30 tablet 11   Probiotic Product (ALIGN PO) Take 1 capsule by mouth in the morning.     sotalol (BETAPACE) 120 MG tablet TAKE 1 TABLET(120 MG) BY MOUTH TWICE DAILY 180 tablet 1   Zinc 50 MG CAPS Take 50 mg by mouth 2 (two) times daily.      No current facility-administered medications for this encounter.    Allergies  Allergen Reactions   Dextromethorphan Other (See Comments)    nervous   Versed [Midazolam] Other (See Comments)    "hallucinations, headache"   Antihistamines, Diphenhydramine-Type Anxiety    Social History   Socioeconomic History   Marital status: Married    Spouse name: Not on file   Number of children: 2   Years of education: Not on file   Highest education level: Not on file  Occupational History   Occupation: retired  Tobacco Use   Smoking status: Former    Packs/day: 2.00    Years: 20.00    Total pack years: 40.00    Types: Cigarettes    Quit date: 07/06/1981    Years since quitting: 40.8   Smokeless tobacco: Never   Tobacco comments:    Former smoker 10/21/21  Vaping Use   Vaping Use: Never used  Substance and Sexual Activity   Alcohol use: Not Currently    Comment: stop drinking 2 glasses of wine since ablation 10/21/21   Drug use: No   Sexual activity: Not on file  Other Topics Concern   Not on file  Social History Narrative   Originally from  [place]. Moved to [place] in 2012. Currently has no pets. No bird or hot tub exposure. Previously worked running [place]. He also worked as a Air cabin crew. He did have exposure to Beneze, Acetone, Styrene, & Hydrocarbon early in his career.   Social Determinants of Health   Financial Resource Strain: Not on file  Food Insecurity: Not on file  Transportation Needs: Not on file  Physical Activity: Not on file  Stress: Not on file  Social Connections: Not on file  Intimate Partner Violence: Not on file    Family History  Problem Relation Age of Onset   Hypertrophic cardiomyopathy Mother    Arrhythmia Mother    Heart failure Mother    Pneumonia Father    Diabetes Father    Colon polyps Father    Heart attack Brother    Sleep apnea Brother    Hypertrophic cardiomyopathy Brother    Heart Problems Brother        VAD   Arrhythmia Brother    Heart Problems Brother    Diabetes Brother    Sudden death Brother    Lung disease Neg Hx     ROS- All systems are reviewed and negative except as per the HPI above  Physical Exam: There were no vitals filed for this visit.   Wt Readings from Last 3 Encounters:  12/19/21 82.1 kg  11/03/21 80.6 kg  10/21/21 80.3 kg    Labs: Lab Results  Component Value Date   NA 137 10/10/2021   K 3.9 10/10/2021   CL 102 10/10/2021   CO2 27 10/10/2021   GLUCOSE 114 (H) 10/10/2021   BUN 12 10/10/2021   CREATININE 1.10 10/10/2021   CALCIUM 9.0 10/10/2021   MG 2.1 07/24/2021   Lab Results  Component Value Date   INR 1.2 10/10/2021   No results found for: "CHOL", "HDL", "LDLCALC", "TRIG"   GEN- The patient is well appearing, alert and oriented x 3 today.   Head- normocephalic, atraumatic Eyes-  Sclera clear, conjunctiva pink Ears- hearing intact Oropharynx- clear Neck- supple, no JVP Lymph- no cervical lymphadenopathy Lungs- Clear to ausculation bilaterally, normal work of breathing Heart regular rate and rhythm, no murmurs, rubs or  gallops, PMI not laterally displaced GI- soft, NT, ND, + BS Extremities- no clubbing, cyanosis, or edema MS- no significant deformity or atrophy Skin- no rash or lesion Psych- euthymic mood, full affect Neuro- strength and sensation are intact  EKG-  Vent. rate 83 BPM PR interval 222 ms QRS duration 72 ms QT/QTcB 382/448 ms P-R-T axes * 106 251 Sinus rhythm with 1st degree A-V block Rightward axis Marked ST abnormality, possible inferior subendocardial injury Abnormal ECG When compared with ECG of 21-Oct-2021 11:52, PREVIOUS ECG IS PRESENT   1. Hypertrophic cardiomyopathy, sigmoid subtype. Basal septum measures 18  mm in maximal dimension. Flow acceleration noted at the LVOT, no LVOT  obstruction noted. Chordal systolic anterior motion of the mitral valve  without involvment of the anterior  leaflet.   2. Left ventricular ejection fraction, by visual estimation, is 60 to  65%. The left ventricle has normal function. There is severely increased  left ventricular hypertrophy.   3. Elevated left atrial and left ventricular end-diastolic pressures.   4. Left ventricular diastolic parameters are consistent with Grade III  diastolic dysfunction (restrictive).   5. Global right ventricle has normal systolic function.The right  ventricular size is normal. No increase in right ventricular wall  thickness.   6. Left atrial size was  moderately dilated.   7. Right atrial size was moderately dilated.   8. The mitral valve is normal in structure. Mild mitral valve  regurgitation. No evidence of mitral stenosis.   9. The tricuspid valve is normal in structure. Tricuspid valve  regurgitation is mild.  10. The aortic valve is tricuspid. Aortic valve regurgitation is not  visualized. No evidence of aortic valve sclerosis or stenosis.  11. The pulmonic valve was normal in structure. Pulmonic valve  regurgitation is mild.  12. Moderately elevated pulmonary artery systolic pressure.  13. The  inferior vena cava is dilated in size with <50% respiratory  variability, suggesting right atrial pressure of 15 mmHg.  14. The left ventricle has no regional wall motion abnormalities.  Echo- 2020----------------------------------------------------------------- Study Conclusions   - Left ventricle: The cavity size was normal. There was moderate   asymmetric hypertrophy of the septum. The appearance was   consistent with hypertrophic cardiomyopathy. Systolic function   was vigorous. The estimated ejection fraction was in the range of   65% to 70%. There was no dynamic obstruction. Wall motion was   normal; there were no regional wall motion abnormalities. - Mitral valve: There was mild to moderate regurgitation directed   centrally. - Left atrium: The atrium was severely dilated. - Right ventricle: The cavity size was mildly dilated. - Right atrium: The atrium was mildly dilated. - Tricuspid valve: There was moderate regurgitation. - Pulmonary arteries: Systolic pressure was mildly increased. PA   peak pressure: 41 mm Hg (S).   Assessment and Plan: 1. Atrial  flutter  S/p  ablation 06/2020  Had a redo ablation 09/2021   Was maintaining SR until mid July and PPM has shown ongoing atrial flutter, rate controlled, minimally symptomatic   He would like to pursue cardioversion He will continue on sotalol 120 mg bid and metoprolol 50 mg bid  Continue eliquis 5 mg bid for a CHA2DS2VASc score of at least 3, he states no missed doses for at least 3 weeks   2. HCM Stable  St Jude -Merlin ICD Per Dr. Quentin Ore   F/u with  afib clinic one week after cardioversion   Butch Penny C. Haydyn Liddell, [facility] [address] [place], [place] [address] [phone]

## 2022-04-22 ENCOUNTER — Encounter (HOSPITAL_COMMUNITY): Payer: Self-pay

## 2022-04-26 NOTE — Anesthesia Preprocedure Evaluation (Addendum)
Anesthesia Evaluation  Patient identified by MRN, date of birth, ID band Patient awake    Reviewed: Allergy & Precautions, NPO status , Patient's Chart, lab work & pertinent test results  History of Anesthesia Complications Negative for: history of anesthetic complications  Airway Mallampati: III  TM Distance: >3 FB Neck ROM: Full    Dental  (+) Dental Advisory Given   Pulmonary former smoker,    Pulmonary exam normal        Cardiovascular + dysrhythmias Atrial Fibrillation and Ventricular Tachycardia + Cardiac Defibrillator  Rhythm:Irregular Rate:Normal   '22 TEE - EF 60 to 65%. Left atrial size was moderately dilated. Right atrial size was moderately dilated. Mild MR. Mild-Mod TR.   Neuro/Psych negative neurological ROS  negative psych ROS   GI/Hepatic Neg liver ROS, hiatal hernia, GERD  Medicated and Controlled,  Endo/Other  negative endocrine ROS  Renal/GU negative Renal ROS     Musculoskeletal negative musculoskeletal ROS (+)   Abdominal   Peds  Hematology  On eliquis    Anesthesia Other Findings   Reproductive/Obstetrics                            Anesthesia Physical Anesthesia Plan  ASA: 3  Anesthesia Plan: General   Post-op Pain Management:    Induction: Intravenous  PONV Risk Score and Plan: 2 and Treatment may vary due to age or medical condition and Propofol infusion  Airway Management Planned: Natural Airway and Mask  Additional Equipment: None  Intra-op Plan:   Post-operative Plan:   Informed Consent: I have reviewed the patients History and Physical, chart, labs and discussed the procedure including the risks, benefits and alternatives for the proposed anesthesia with the patient or authorized representative who has indicated his/her understanding and acceptance.       Plan Discussed with: CRNA and Anesthesiologist  Anesthesia Plan Comments:         Anesthesia Quick Evaluation

## 2022-04-27 ENCOUNTER — Ambulatory Visit (HOSPITAL_COMMUNITY): Payer: Medicare Other | Admitting: Anesthesiology

## 2022-04-27 ENCOUNTER — Telehealth (HOSPITAL_COMMUNITY): Payer: Self-pay

## 2022-04-27 ENCOUNTER — Other Ambulatory Visit: Payer: Self-pay

## 2022-04-27 ENCOUNTER — Encounter (HOSPITAL_COMMUNITY)
Admission: RE | Disposition: A | Discharge status: Home / Self Care | Payer: Self-pay | Source: Home / Self Care | Attending: Cardiology

## 2022-04-27 ENCOUNTER — Ambulatory Visit (HOSPITAL_BASED_OUTPATIENT_CLINIC_OR_DEPARTMENT_OTHER): Payer: Medicare Other | Admitting: Anesthesiology

## 2022-04-27 ENCOUNTER — Ambulatory Visit (HOSPITAL_COMMUNITY)
Admission: RE | Admit: 2022-04-27 | Discharge: 2022-04-27 | Disposition: A | Discharge status: Home / Self Care | Payer: Medicare Other | Attending: Cardiology | Admitting: Cardiology

## 2022-04-27 DIAGNOSIS — Z87891 Personal history of nicotine dependence: Secondary | ICD-10-CM | POA: Diagnosis not present

## 2022-04-27 DIAGNOSIS — I480 Paroxysmal atrial fibrillation: Secondary | ICD-10-CM | POA: Diagnosis not present

## 2022-04-27 DIAGNOSIS — I4891 Unspecified atrial fibrillation: Secondary | ICD-10-CM

## 2022-04-27 DIAGNOSIS — I441 Atrioventricular block, second degree: Secondary | ICD-10-CM | POA: Insufficient documentation

## 2022-04-27 DIAGNOSIS — K219 Gastro-esophageal reflux disease without esophagitis: Secondary | ICD-10-CM | POA: Insufficient documentation

## 2022-04-27 DIAGNOSIS — Z7901 Long term (current) use of anticoagulants: Secondary | ICD-10-CM | POA: Diagnosis not present

## 2022-04-27 DIAGNOSIS — I4892 Unspecified atrial flutter: Secondary | ICD-10-CM

## 2022-04-27 DIAGNOSIS — I484 Atypical atrial flutter: Secondary | ICD-10-CM | POA: Insufficient documentation

## 2022-04-27 DIAGNOSIS — Z95810 Presence of automatic (implantable) cardiac defibrillator: Secondary | ICD-10-CM | POA: Insufficient documentation

## 2022-04-27 DIAGNOSIS — K449 Diaphragmatic hernia without obstruction or gangrene: Secondary | ICD-10-CM

## 2022-04-27 DIAGNOSIS — I4819 Other persistent atrial fibrillation: Secondary | ICD-10-CM

## 2022-04-27 HISTORY — PX: CARDIOVERSION: SHX1299

## 2022-04-27 SURGERY — CARDIOVERSION
Anesthesia: General

## 2022-04-27 MED ORDER — PROPOFOL 10 MG/ML IV BOLUS
INTRAVENOUS | Status: DC | PRN
  Administered 2022-04-27: 100 mg via INTRAVENOUS

## 2022-04-27 MED ORDER — LIDOCAINE 2% (20 MG/ML) 5 ML SYRINGE
INTRAMUSCULAR | Status: DC | PRN
  Administered 2022-04-27: 60 mg via INTRAVENOUS

## 2022-04-27 MED ORDER — SODIUM CHLORIDE 0.9 % IV SOLN: INTRAVENOUS | Status: DC

## 2022-04-27 MED ORDER — EPHEDRINE SULFATE-NACL 50-0.9 MG/10ML-% IV SOSY
PREFILLED_SYRINGE | INTRAVENOUS | Status: DC | PRN
  Administered 2022-04-27: 5 mg via INTRAVENOUS

## 2022-04-27 NOTE — Anesthesia Procedure Notes (Signed)
Procedure Name: General with mask airway Date/Time: 04/27/2022 8:48 AM  Performed by: Lorie Phenix, CRNAPre-anesthesia Checklist: Patient identified, Emergency Drugs available, Suction available and Patient being monitored Patient Re-evaluated:Patient Re-evaluated prior to induction Oxygen Delivery Method: Ambu bag Preoxygenation: Pre-oxygenation with 100% oxygen

## 2022-04-27 NOTE — CV Procedure (Signed)
    Electrical Cardioversion Procedure Note Joseph Morrow [id number] 1946/03/13  Procedure: Electrical Cardioversion Indications:  Atrial Flutter  Time Out: Verified patient identification, verified procedure,medications/allergies/relevent history reviewed, required imaging and test results available.  Performed  Procedure Details  The patient was NPO after midnight. Anesthesia was administered at the beside  by [facility] with '100mg'$  of propofol.  Cardioversion was performed with synchronized biphasic defibrillation via AP pads with 120 joules.  1 attempt(s) were performed.  The patient converted to normal sinus rhythm. The patient tolerated the procedure well   IMPRESSION:  Successful cardioversion of atrial flutter, atypical St. Jude device - confirmed AV synchrony. No longer A Rolin Barry [facility] 04/27/2022, 8:56 AM

## 2022-04-27 NOTE — Anesthesia Postprocedure Evaluation (Signed)
Anesthesia Post Note  Patient: Joseph Morrow  Procedure(s) Performed: CARDIOVERSION     Patient location during evaluation: PACU Anesthesia Type: General Level of consciousness: awake and alert Pain management: pain level controlled Vital Signs Assessment: post-procedure vital signs reviewed and stable Respiratory status: spontaneous breathing, nonlabored ventilation and respiratory function stable Cardiovascular status: stable and blood pressure returned to baseline Anesthetic complications: no   No notable events documented.  Last Vitals:  Vitals:   04/27/22 0920 04/27/22 0925  BP: 99/74 102/67  Pulse: (!) 58 (!) 57  Resp: 12 13  Temp:    SpO2: 97% 100%    Last Pain:  Vitals:   04/27/22 0920  TempSrc:   PainSc: 0-No pain                 Audry Pili

## 2022-04-27 NOTE — Discharge Instructions (Signed)

## 2022-04-27 NOTE — Transfer of Care (Signed)
Immediate Anesthesia Transfer of Care Note  Patient: Joseph Morrow  Procedure(s) Performed: CARDIOVERSION  Patient Location: Endoscopy Unit  Anesthesia Type:General  Level of Consciousness: drowsy  Airway & Oxygen Therapy: Patient Spontanous Breathing  Post-op Assessment: Report given to RN and Post -op Vital signs reviewed and stable  Post vital signs: Reviewed and stable  Last Vitals:  Vitals Value Taken Time  BP 96/72   Temp    Pulse 56   Resp 15   SpO2 94     Last Pain:  Vitals:   04/27/22 0742  TempSrc: Temporal  PainSc: 0-No pain         Complications: No notable events documented.

## 2022-04-27 NOTE — Telephone Encounter (Signed)
Patient called in about his A-fib. This morning he had a cardioversion and he was concerned he may be back in A-fib. Patient was told to send a transmission to the device clinic. Device clinic faxed over the transmission. Ricky Fenton-PA -reviewed his transmission. Patient was told according to his transmission he is in normal sinus rhythm. He is scheduled for follow up next week to see Ceasar Lund on 8/21 @ 9:30am. Reminded patient about appointment. Communicated with patient and he verbalized understanding

## 2022-04-27 NOTE — Interval H&P Note (Signed)
History and Physical Interval Note:  04/27/2022 8:44 AM  Joseph Morrow  has presented today for surgery, with the diagnosis of atrial fibrillation.  The various methods of treatment have been discussed with the patient and family. After consideration of risks, benefits and other options for treatment, the patient has consented to  Procedure(s): CARDIOVERSION (N/A) as a surgical intervention.  The patient's history has been reviewed, patient examined, no change in status, stable for surgery.  I have reviewed the patient's chart and labs.  Questions were answered to the patient's satisfaction.     [employer]

## 2022-04-29 ENCOUNTER — Encounter (HOSPITAL_COMMUNITY): Payer: Self-pay | Admitting: Cardiology

## 2022-05-04 ENCOUNTER — Encounter (HOSPITAL_COMMUNITY): Payer: Self-pay | Admitting: Nurse Practitioner

## 2022-05-04 ENCOUNTER — Ambulatory Visit (HOSPITAL_COMMUNITY)
Admission: RE | Admit: 2022-05-04 | Discharge: 2022-05-04 | Disposition: A | Discharge status: Home / Self Care | Payer: Medicare Other | Source: Ambulatory Visit | Attending: Nurse Practitioner | Admitting: Nurse Practitioner

## 2022-05-04 VITALS — BP 96/60 | HR 56 | Ht 70.0 in | Wt 184.4 lb

## 2022-05-04 DIAGNOSIS — M542 Cervicalgia: Secondary | ICD-10-CM | POA: Diagnosis not present

## 2022-05-04 DIAGNOSIS — I422 Other hypertrophic cardiomyopathy: Secondary | ICD-10-CM | POA: Insufficient documentation

## 2022-05-04 DIAGNOSIS — D6869 Other thrombophilia: Secondary | ICD-10-CM | POA: Diagnosis not present

## 2022-05-04 DIAGNOSIS — Z7901 Long term (current) use of anticoagulants: Secondary | ICD-10-CM | POA: Diagnosis not present

## 2022-05-04 DIAGNOSIS — I4892 Unspecified atrial flutter: Secondary | ICD-10-CM | POA: Diagnosis not present

## 2022-05-04 DIAGNOSIS — I480 Paroxysmal atrial fibrillation: Secondary | ICD-10-CM | POA: Diagnosis not present

## 2022-05-04 DIAGNOSIS — I4891 Unspecified atrial fibrillation: Secondary | ICD-10-CM

## 2022-05-04 DIAGNOSIS — M9902 Segmental and somatic dysfunction of thoracic region: Secondary | ICD-10-CM | POA: Diagnosis not present

## 2022-05-04 DIAGNOSIS — M791 Myalgia, unspecified site: Secondary | ICD-10-CM | POA: Diagnosis not present

## 2022-05-04 DIAGNOSIS — M50222 Other cervical disc displacement at C5-C6 level: Secondary | ICD-10-CM | POA: Diagnosis not present

## 2022-05-04 DIAGNOSIS — R293 Abnormal posture: Secondary | ICD-10-CM | POA: Diagnosis not present

## 2022-05-04 DIAGNOSIS — M5137 Other intervertebral disc degeneration, lumbosacral region: Secondary | ICD-10-CM | POA: Diagnosis not present

## 2022-05-04 DIAGNOSIS — M5459 Other low back pain: Secondary | ICD-10-CM | POA: Diagnosis not present

## 2022-05-04 DIAGNOSIS — M9901 Segmental and somatic dysfunction of cervical region: Secondary | ICD-10-CM | POA: Diagnosis not present

## 2022-05-04 DIAGNOSIS — M9903 Segmental and somatic dysfunction of lumbar region: Secondary | ICD-10-CM | POA: Diagnosis not present

## 2022-05-04 NOTE — Progress Notes (Signed)
Primary Care Physician: Ginger Organ., MD Referring Physician:Dr. 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 male with a h/o hypertrophic cardiomyopathy, s/p ICD for VT, remote paroxysmal afib, maintaining SR with a very small afib burden on Sotalol. Hospitalized for sotalol administration 02/2017. Chadsvasc score of 2(age, LV dysfunction), continues on eliquis 5 mg bid.  Qtc stable.   He asked to be seen today for afib noted yesterday. Interrogation of ICD/PPM confirms afib on 2/10 and 2/11, mostly starting 2/10 at 11:54 pm and continuing to 5:03 am, 2/11. He states that he drinks alcohol nightly and he often takes his sotalol 14 hours apart. Otherwise, no change from his usual health. Denies snoring. He continues on sotalol.  F/u in afib clinic, 02/14/20. I am seeing him as the device clinic noted onset of atrial flutter, rate controlled since  5/30. In the office he continues in rate controlled aflutter. He is mildly symptomatic with this. He continues on sotalol 120 mg bid, and is compliant. He has 2 alcoholic drinks a week. He had been on BB in the past . Stopped when sotalol loaded. Continues on eliquis 5 mg bid for a CHA2DS2VASc score of 3.   F/u in afib clinic, 03/11/20, as an urgent work-in as he noted increase in HR last night via Fitbit and confirmed with [name]. If not for seeing his HR elevated, he would not have felt  differently out of rhythm. He is on sotalol 120 mg bid and has been on this for several years. He saw Dr. Rayann Heman in June , was doing ok, but it was mentioned in his note, that if afib burden increased, he could consider ablation.  No significant change in health recently. No alcohol, caffeine, tobacco.  By  by the device clinic report this am, he appears to be in an atrial flutter at 117 bpm. BP on arrival 88/70. With a glass of water, BP came up to 104/70. The only medicine he is on is 12.5 metoprolol succinate that would affect BP. This was added in  June on an occasion  when he was out of rhythm. He will usually convert himself.   F/u in afib clinic, 07/18/20. He called the clinic earlier in the week, that he was out of rhythm. He does report that he missed all meds Friday pm and went out of rhythm Saturday pm, including his DOAC. He is in an atrial  Flutter, rate controlled. Reading his ablation OP report, he did  have at least 3 flutter circuits that could not be mapped or ablated. No swallowing or groin issues.  F/u in afib clinic, 11/11. He has returned to [place]. Ekg shows sinus brady at 57 bpm, with first degree AV block.  PR int 236 ms, qrs int 66 ms, qtc 428 ms. He feels well.   F/u 08/02/20. He is now one month out from  ablation and has been in [place] for the last 2 weeks. He feels improved. No issues today.   F/u afib clinic 07/24/21. He went out of rhythm  over the weekend. He has noted more exertional dyspnea and fatigue with this episode. He had vertigo last week with dizziness and N/V with PCP treating with Antivert. It is now better. He has atrial flutter with RVR at 111 bpm.   F/u 08/04/21. He is in the clinic as he went out of rhythm yesterday with RVR at times. He hs noted some H/A at times since being  out of rhythm. He just had a successful cardioversion 11/16 but now with ERAF. His ablation was last October.  Because of hypertrophic cardiomyopathy, his best drug choice would be amiodarone if sotalol was stopped.   F/u 10/21/21 for one month s/p ablation. He is maintaining  SR. He feels well. He had a bruise left thigh but it is resolved . No swallowing issues. Remains on sotalol and metoprolol . No issues with anticoagultion.   F/u in the afib clinic, 04/20/22. He has had ongoing aflutter with controlled rates since 7/15 as notified by the device clinic. He is minimally symptomatic with this. No missed anticoagulation. Only trigger that pt  reports is that he developed vertigo and took several doses of meclizine, he developed severe  constipation after this, now resolved. He is in agreement to  schedule for cardioversion.  F/u cardioversion, 05/04/22. He had a successful cardioversion 8/14 and is now back for f/u. EKG shows sinus brady with first degree block at 56 bpm.  Today, he denies symptoms of   chest pain, shortness of breath, orthopnea, PND, lower extremity edema, dizziness, presyncope, syncope, or neurologic sequela.  Positive for palpitations..The patient is tolerating medications without difficulties and is otherwise without complaint today.   Past Medical History:  Diagnosis Date   Acute cholecystitis 06/21/2011   Allergy    SEASONAL   Cataract    Complication of anesthesia    HALLUCINATIONS   ED (erectile dysfunction)    GERD (gastroesophageal reflux disease)    Hearing loss    Helicobacter pylori gastritis 06/2014   Hemorrhoids    Hiatal hernia    Hx of adenomatous colonic polyps 06/2014   Hypertrophic cardiomyopathy (HCC)    Mitral regurgitation    Moderate   Osteopenia    Paroxysmal atrial fibrillation ([place]) 04/2015   documented on ICD remote interrogation, treated with sotalol   Septal defect    LV Septal hyptertrophy 27m, normal EF, minimal LVOT gradiant.   Tubular adenoma of colon    VT (ventricular tachycardia) ([place] 08/18/2013   s/p St. Jude ICD   Past Surgical History:  Procedure Laterality Date   APPENDECTOMY  1950   ATRIAL FIBRILLATION ABLATION N/A 07/04/2020   Procedure: ATRIAL FIBRILLATION ABLATION;  Surgeon: AThompson Grayer MD;  [facility];  Service: Cardiovascular;  Laterality: N/A;   ATRIAL FIBRILLATION ABLATION N/A 09/18/2021   Procedure: ATRIAL FIBRILLATION ABLATION;  Surgeon: LVickie Epley MD;  [facility];  Service: Cardiovascular;  Laterality: N/A;   CARDIOVERSION N/A 02/25/2017   Procedure: CARDIOVERSION;  Surgeon: RFay Records MD;  [facility];  Service: Cardiovascular;  Laterality: N/A;   CARDIOVERSION N/A  07/30/2021   Procedure: CARDIOVERSION;  Surgeon: BJanina Mayo MD;  [facility]  Service: Cardiovascular;  Laterality: N/A;   CARDIOVERSION N/A 04/27/2022   Procedure: CARDIOVERSION;  Surgeon: SJerline Pain MD;  [facility]  Service: Cardiovascular;  Laterality: N/A;   CATARACT EXTRACTION, BILATERAL     CHOLECYSTECTOMY  06/24/2011   COLONOSCOPY     EP IMPLANTABLE DEVICE N/A 09/03/2016   SJM Fortify [address] generator change by Dr ARayann Heman  ICD implanted for secondary prevention of sudden death   IMPLANTABLE CARDIOVERTER DEFIBRILLATOR IMPLANT  08/18/2013   St. Jude Fortify [address] DR ICD implanted by Dr ARayann Hemanfor secondary treatment of VT   IMPLANTABLE CARDIOVERTER DEFIBRILLATOR IMPLANT N/A 08/18/2013   Procedure: IMPLANTABLE CARDIOVERTER DEFIBRILLATOR IMPLANT;  Surgeon: JCoralyn Mark MD;  [facility];  Service: Cardiovascular;  Laterality: N/A;   TEE WITHOUT CARDIOVERSION N/A 07/30/2021   Procedure: TRANSESOPHAGEAL ECHOCARDIOGRAM (TEE);  Surgeon: Janina Mayo, MD;  Location: [facility] ENDOSCOPY;  Service: Cardiovascular;  Laterality: N/A;    Current Outpatient Medications  Medication Sig Dispense Refill   acetaminophen (TYLENOL) 500 MG tablet Take 500-1,000 mg by mouth every 6 (six) hours as needed (pain.).     apixaban (ELIQUIS) 5 MG TABS tablet TAKE 1 TABLET TWICE A DAY 60 tablet 2   calcium carbonate (OSCAL) 1500 (600 Ca) MG TABS tablet Take 600 mg by mouth in the morning and at bedtime.     calcium carbonate (TUMS EX) 750 MG chewable tablet Chew 1 tablet by mouth 3 (three) times daily as needed for heartburn.     Cholecalciferol (VITAMIN D3) 50 MCG (2000 UT) TABS Take 2,000 Units by mouth in the morning and at bedtime.     Coenzyme Q10 (CO Q 10) 100 MG CAPS Take 100 mg by mouth in the morning.     cyclobenzaprine (FLEXERIL) 10 MG tablet Take 5-10 mg by mouth every 8 (eight) hours as needed.     diltiazem (CARDIZEM) 30 MG tablet Cardizem '30mg'$  -- take 1  tablet every 4 hours AS NEEDED for heart rate >100 30 tablet 3   furosemide (LASIX) 20 MG tablet Take 1 tablet by mouth daily until 11/16 then only as needed for swelling/weight gain (Patient taking differently: Take 20 mg by mouth daily as needed for fluid or edema. swelling/weight gain) 30 tablet 11   Glucosamine-Chondroitin (COSAMIN DS PO) Take 1 tablet by mouth in the morning.     metoprolol succinate (TOPROL-XL) 50 MG 24 hr tablet TAKE 1 TABLET TWICE A DAY WITH OR IMMEDIATELY FOLLOWING A MEAL 180 tablet 3   omeprazole (PRILOSEC OTC) 20 MG tablet Take 10 mg by mouth in the morning.     potassium citrate (UROCIT-K) 10 MEQ (1080 MG) SR tablet Take 1 tablet (10 mEq total) by mouth daily as needed (with lasix (swelling/weight gain)). 30 tablet 11   Probiotic Product (ALIGN PO) Take 1 capsule by mouth in the morning.     Simethicone (GAS-X ULTRA STRENGTH) 180 MG CAPS Take 180 mg by mouth 2 (two) times daily.     sotalol (BETAPACE) 120 MG tablet TAKE 1 TABLET(120 MG) BY MOUTH TWICE DAILY 180 tablet 1   Zinc 50 MG CAPS Take 50 mg by mouth 2 (two) times daily.      No current facility-administered medications for this encounter.    Allergies  Allergen Reactions   Dextromethorphan Other (See Comments)    nervous   Meclizine     Caused increased heart rate and constipation   Versed [Midazolam] Other (See Comments)    "hallucinations, headache"   Zofran [Ondansetron]     Caused increased heart rate and constipation   Antihistamines, Diphenhydramine-Type Anxiety    Social History   Socioeconomic History   Marital status: Married    Spouse name: Not on file   Number of children: 2   Years of education: Not on file   Highest education level: Not on file  Occupational History   Occupation: retired  Tobacco Use   Smoking status: Former    Packs/day: 2.00    Years: 20.00    Total pack years: 40.00    Types: Cigarettes    Quit date: 07/06/1981    Years since quitting: 40.8   Smokeless  tobacco: Never   Tobacco comments:  Former smoker 10/21/21  Vaping Use   Vaping Use: Never used  Substance and Sexual Activity   Alcohol use: Not Currently    Comment: stop drinking 2 glasses of wine since ablation 10/21/21   Drug use: No   Sexual activity: Not on file  Other Topics Concern   Not on file  Social History Narrative   Originally from [place]. Moved to [place] in 2012. Currently has no pets. No bird or hot tub exposure. Previously worked running [employer]. He also worked as a Air cabin crew. He did have exposure to Beneze, Acetone, Styrene, & Hydrocarbon early in his career.   Social Determinants of Health   Financial Resource Strain: Not on file  Food Insecurity: Not on file  Transportation Needs: Not on file  Physical Activity: Not on file  Stress: Not on file  Social Connections: Not on file  Intimate Partner Violence: Not on file    Family History  Problem Relation Age of Onset   Hypertrophic cardiomyopathy Mother    Arrhythmia Mother    Heart failure Mother    Pneumonia Father    Diabetes Father    Colon polyps Father    Heart attack Brother    Sleep apnea Brother    Hypertrophic cardiomyopathy Brother    Heart Problems Brother        VAD   Arrhythmia Brother    Heart Problems Brother    Diabetes Brother    Sudden death Brother    Lung disease Neg Hx     ROS- All systems are reviewed and negative except as per the HPI above  Physical Exam: Vitals:   05/04/22 0929  Height: '5\' 10"'$  (1.778 m)     Wt Readings from Last 3 Encounters:  04/27/22 78.5 kg  04/20/22 83.5 kg  12/19/21 82.1 kg    Labs: Lab Results  Component Value Date   NA 139 04/20/2022   K 4.2 04/20/2022   CL 104 04/20/2022   CO2 25 04/20/2022   GLUCOSE 135 (H) 04/20/2022   BUN 21 04/20/2022   CREATININE 1.20 04/20/2022   CALCIUM 9.4 04/20/2022   MG 1.8 04/20/2022   Lab Results  Component Value Date   INR 1.2 10/10/2021   No results found for: "CHOL", "HDL",  "LDLCALC", "TRIG"   GEN- The patient is well appearing, alert and oriented x 3 today.   Head- normocephalic, atraumatic Eyes-  Sclera clear, conjunctiva pink Ears- hearing intact Oropharynx- clear Neck- supple, no JVP Lymph- no cervical lymphadenopathy Lungs- Clear to ausculation bilaterally, normal work of breathing Heart regular rate and rhythm, no murmurs, rubs or gallops, PMI not laterally displaced GI- soft, NT, ND, + BS Extremities- no clubbing, cyanosis, or edema MS- no significant deformity or atrophy Skin- no rash or lesion Psych- euthymic mood, full affect Neuro- strength and sensation are intact  EVent. rate 56 BPM PR interval 272 ms QRS duration 72 ms QT/QTcB 442/426 ms P-R-T axes 29 105 265 Sinus bradycardia with 1st degree A-V block Possible Lateral infarct , age undetermined ST & T wave abnormality, consider inferior ischemia Abnormal ECG When compared with ECG of 27-Apr-2022 09:06, PREVIOUS ECG IS PRESENTkg-   1. Hypertrophic cardiomyopathy, sigmoid subtype. Basal septum measures 18  mm in maximal dimension. Flow acceleration noted at the LVOT, no LVOT  obstruction noted. Chordal systolic anterior motion of the mitral valve  without involvment of the anterior  leaflet.   2. Left ventricular ejection fraction, by visual estimation, is 60 to  65%. The left ventricle has normal function. There is severely increased  left ventricular hypertrophy.   3. Elevated left atrial and left ventricular end-diastolic pressures.   4. Left ventricular diastolic parameters are consistent with Grade III  diastolic dysfunction (restrictive).   5. Global right ventricle has normal systolic function.The right  ventricular size is normal. No increase in right ventricular wall  thickness.   6. Left atrial size was moderately dilated.   7. Right atrial size was moderately dilated.   8. The mitral valve is normal in structure. Mild mitral valve  regurgitation. No evidence of  mitral stenosis.   9. The tricuspid valve is normal in structure. Tricuspid valve  regurgitation is mild.  10. The aortic valve is tricuspid. Aortic valve regurgitation is not  visualized. No evidence of aortic valve sclerosis or stenosis.  11. The pulmonic valve was normal in structure. Pulmonic valve  regurgitation is mild.  12. Moderately elevated pulmonary artery systolic pressure.  13. The inferior vena cava is dilated in size with <50% respiratory  variability, suggesting right atrial pressure of 15 mmHg.  14. The left ventricle has no regional wall motion abnormalities.  Echo- 2020----------------------------------------------------------------- Study Conclusions   - Left ventricle: The cavity size was normal. There was moderate   asymmetric hypertrophy of the septum. The appearance was   consistent with hypertrophic cardiomyopathy. Systolic function   was vigorous. The estimated ejection fraction was in the range of   65% to 70%. There was no dynamic obstruction. Wall motion was   normal; there were no regional wall motion abnormalities. - Mitral valve: There was mild to moderate regurgitation directed   centrally. - Left atrium: The atrium was severely dilated. - Right ventricle: The cavity size was mildly dilated. - Right atrium: The atrium was mildly dilated. - Tricuspid valve: There was moderate regurgitation. - Pulmonary arteries: Systolic pressure was mildly increased. PA   peak pressure: 41 mm Hg (S).   Assessment and Plan: 1. Atrial  flutter  S/p  ablation 06/2020  Had a redo ablation 09/2021   Was maintaining SR until mid July and PPM has shown ongoing atrial flutter, rate controlled, minimally symptomatic   Cardioversion pursued and was successful 04/27/22 He will continue on sotalol 120 mg bid and metoprolol 50 mg bid  Continue eliquis 5 mg bid for a CHA2DS2VASc score of at least 3   2. HCM Stable  St Jude -Merlin ICD Per Dr. Quentin Ore  F/u with Afib clinic  for sotalol surveillance  and Dr. Quentin Ore as scheduled   Joseph Morrow, [facility] [address] [place], [place] [address] [phone]

## 2022-05-11 DIAGNOSIS — M9903 Segmental and somatic dysfunction of lumbar region: Secondary | ICD-10-CM | POA: Diagnosis not present

## 2022-05-11 DIAGNOSIS — M5137 Other intervertebral disc degeneration, lumbosacral region: Secondary | ICD-10-CM | POA: Diagnosis not present

## 2022-05-11 DIAGNOSIS — M50222 Other cervical disc displacement at C5-C6 level: Secondary | ICD-10-CM | POA: Diagnosis not present

## 2022-05-11 DIAGNOSIS — R293 Abnormal posture: Secondary | ICD-10-CM | POA: Diagnosis not present

## 2022-05-11 DIAGNOSIS — M791 Myalgia, unspecified site: Secondary | ICD-10-CM | POA: Diagnosis not present

## 2022-05-11 DIAGNOSIS — M9902 Segmental and somatic dysfunction of thoracic region: Secondary | ICD-10-CM | POA: Diagnosis not present

## 2022-05-11 DIAGNOSIS — M542 Cervicalgia: Secondary | ICD-10-CM | POA: Diagnosis not present

## 2022-05-11 DIAGNOSIS — M5459 Other low back pain: Secondary | ICD-10-CM | POA: Diagnosis not present

## 2022-05-11 DIAGNOSIS — M9901 Segmental and somatic dysfunction of cervical region: Secondary | ICD-10-CM | POA: Diagnosis not present

## 2022-05-18 ENCOUNTER — Other Ambulatory Visit: Payer: Self-pay | Admitting: Cardiology

## 2022-05-18 DIAGNOSIS — I480 Paroxysmal atrial fibrillation: Secondary | ICD-10-CM

## 2022-05-19 NOTE — Telephone Encounter (Signed)
Prescription refill request for Eliquis received. Indication:Afib Last office visit:8/23 Scr:1.2 Age: 76 Weight:83.6 kg  Prescription refilled

## 2022-05-25 DIAGNOSIS — R293 Abnormal posture: Secondary | ICD-10-CM | POA: Diagnosis not present

## 2022-05-25 DIAGNOSIS — M791 Myalgia, unspecified site: Secondary | ICD-10-CM | POA: Diagnosis not present

## 2022-05-25 DIAGNOSIS — M9902 Segmental and somatic dysfunction of thoracic region: Secondary | ICD-10-CM | POA: Diagnosis not present

## 2022-05-25 DIAGNOSIS — M5459 Other low back pain: Secondary | ICD-10-CM | POA: Diagnosis not present

## 2022-05-25 DIAGNOSIS — M50222 Other cervical disc displacement at C5-C6 level: Secondary | ICD-10-CM | POA: Diagnosis not present

## 2022-05-25 DIAGNOSIS — M542 Cervicalgia: Secondary | ICD-10-CM | POA: Diagnosis not present

## 2022-05-25 DIAGNOSIS — M5137 Other intervertebral disc degeneration, lumbosacral region: Secondary | ICD-10-CM | POA: Diagnosis not present

## 2022-05-25 DIAGNOSIS — M9903 Segmental and somatic dysfunction of lumbar region: Secondary | ICD-10-CM | POA: Diagnosis not present

## 2022-05-25 DIAGNOSIS — M9901 Segmental and somatic dysfunction of cervical region: Secondary | ICD-10-CM | POA: Diagnosis not present

## 2022-06-05 ENCOUNTER — Ambulatory Visit (INDEPENDENT_AMBULATORY_CARE_PROVIDER_SITE_OTHER): Payer: Medicare Other

## 2022-06-05 DIAGNOSIS — Z95810 Presence of automatic (implantable) cardiac defibrillator: Secondary | ICD-10-CM

## 2022-06-08 DIAGNOSIS — M9902 Segmental and somatic dysfunction of thoracic region: Secondary | ICD-10-CM | POA: Diagnosis not present

## 2022-06-08 DIAGNOSIS — M9903 Segmental and somatic dysfunction of lumbar region: Secondary | ICD-10-CM | POA: Diagnosis not present

## 2022-06-08 DIAGNOSIS — M791 Myalgia, unspecified site: Secondary | ICD-10-CM | POA: Diagnosis not present

## 2022-06-08 DIAGNOSIS — M542 Cervicalgia: Secondary | ICD-10-CM | POA: Diagnosis not present

## 2022-06-08 DIAGNOSIS — L821 Other seborrheic keratosis: Secondary | ICD-10-CM | POA: Diagnosis not present

## 2022-06-08 DIAGNOSIS — D1801 Hemangioma of skin and subcutaneous tissue: Secondary | ICD-10-CM | POA: Diagnosis not present

## 2022-06-08 DIAGNOSIS — L570 Actinic keratosis: Secondary | ICD-10-CM | POA: Diagnosis not present

## 2022-06-08 DIAGNOSIS — L565 Disseminated superficial actinic porokeratosis (DSAP): Secondary | ICD-10-CM | POA: Diagnosis not present

## 2022-06-08 DIAGNOSIS — M9901 Segmental and somatic dysfunction of cervical region: Secondary | ICD-10-CM | POA: Diagnosis not present

## 2022-06-08 DIAGNOSIS — R293 Abnormal posture: Secondary | ICD-10-CM | POA: Diagnosis not present

## 2022-06-08 DIAGNOSIS — M5459 Other low back pain: Secondary | ICD-10-CM | POA: Diagnosis not present

## 2022-06-08 DIAGNOSIS — M5137 Other intervertebral disc degeneration, lumbosacral region: Secondary | ICD-10-CM | POA: Diagnosis not present

## 2022-06-08 DIAGNOSIS — M50222 Other cervical disc displacement at C5-C6 level: Secondary | ICD-10-CM | POA: Diagnosis not present

## 2022-06-08 LAB — CUP PACEART REMOTE DEVICE CHECK
Battery Remaining Longevity: 43 mo
Battery Remaining Percentage: 42 %
Battery Voltage: 2.93 V
Brady Statistic AP VP Percent: 3.1 %
Brady Statistic AP VS Percent: 29 %
Brady Statistic AS VP Percent: 1 %
Brady Statistic AS VS Percent: 66 %
Brady Statistic RA Percent Paced: 26 %
Brady Statistic RV Percent Paced: 8.4 %
Date Time Interrogation Session: 20230922020021
HighPow Impedance: 52 Ohm
HighPow Impedance: 52 Ohm
Implantable Lead Implant Date: 20141205
Implantable Lead Implant Date: 20141205
Implantable Lead Location: 753859
Implantable Lead Location: 753860
Implantable Lead Model: 7121
Implantable Pulse Generator Implant Date: 20171221
Lead Channel Impedance Value: 440 Ohm
Lead Channel Impedance Value: 540 Ohm
Lead Channel Pacing Threshold Amplitude: 0.5 V
Lead Channel Pacing Threshold Amplitude: 1 V
Lead Channel Pacing Threshold Pulse Width: 0.5 ms
Lead Channel Pacing Threshold Pulse Width: 0.5 ms
Lead Channel Sensing Intrinsic Amplitude: 12 mV
Lead Channel Sensing Intrinsic Amplitude: 3.9 mV
Lead Channel Setting Pacing Amplitude: 2 V
Lead Channel Setting Pacing Amplitude: 2.5 V
Lead Channel Setting Pacing Pulse Width: 0.5 ms
Lead Channel Setting Sensing Sensitivity: 0.5 mV
Pulse Gen Serial Number: 7387049

## 2022-06-12 NOTE — Progress Notes (Signed)
Remote ICD transmission.   

## 2022-06-22 DIAGNOSIS — M9903 Segmental and somatic dysfunction of lumbar region: Secondary | ICD-10-CM | POA: Diagnosis not present

## 2022-06-22 DIAGNOSIS — M5137 Other intervertebral disc degeneration, lumbosacral region: Secondary | ICD-10-CM | POA: Diagnosis not present

## 2022-06-22 DIAGNOSIS — M9901 Segmental and somatic dysfunction of cervical region: Secondary | ICD-10-CM | POA: Diagnosis not present

## 2022-06-22 DIAGNOSIS — R293 Abnormal posture: Secondary | ICD-10-CM | POA: Diagnosis not present

## 2022-06-22 DIAGNOSIS — I422 Other hypertrophic cardiomyopathy: Secondary | ICD-10-CM | POA: Diagnosis not present

## 2022-06-22 DIAGNOSIS — M542 Cervicalgia: Secondary | ICD-10-CM | POA: Diagnosis not present

## 2022-06-22 DIAGNOSIS — M9902 Segmental and somatic dysfunction of thoracic region: Secondary | ICD-10-CM | POA: Diagnosis not present

## 2022-06-22 DIAGNOSIS — M5459 Other low back pain: Secondary | ICD-10-CM | POA: Diagnosis not present

## 2022-06-22 DIAGNOSIS — M50222 Other cervical disc displacement at C5-C6 level: Secondary | ICD-10-CM | POA: Diagnosis not present

## 2022-06-22 DIAGNOSIS — M791 Myalgia, unspecified site: Secondary | ICD-10-CM | POA: Diagnosis not present

## 2022-06-22 DIAGNOSIS — I480 Paroxysmal atrial fibrillation: Secondary | ICD-10-CM | POA: Diagnosis not present

## 2022-06-30 ENCOUNTER — Other Ambulatory Visit (HOSPITAL_COMMUNITY): Payer: Self-pay | Admitting: Nurse Practitioner

## 2022-07-02 ENCOUNTER — Telehealth: Payer: Self-pay

## 2022-07-02 NOTE — Telephone Encounter (Signed)
Received the following alert from [employer]:  Alert remote reviewed. Normal device function.   Known AF, on Scraper, AF burden is 20% of the time, good ventricular rate control.  Sent to triage due to ongoing AF Next remote 09/04/2022.  Kathy Breach, RN, CCDS, [employer]  Patient had successful cardioversion in August of this year.  However, noted increase AT/AF burden in recent days.  Presenting rhythm on transmission appears to be A Flutter with controlled V rates.    I called to check on patient.  He reports no changes in his diet, medications or health in recent days. He does mention an increase in his exercise regimen with climbing steeper hills and walking longer distances.  He did note some feelings of "tiredness in his legs" but chalked that up to increased activity. He also reports a very restless night of sleep last night, which would have occurred at the timing of the freeze capture report.   I will forward to Orson Eva, NP in the afib clinic and Dr. Myles Gip as Juluis Rainier and let patient know that we would follow up only as needed but will continue to monitor.

## 2022-07-06 ENCOUNTER — Encounter (HOSPITAL_COMMUNITY): Payer: Self-pay

## 2022-07-07 ENCOUNTER — Telehealth: Payer: Self-pay

## 2022-07-07 NOTE — Telephone Encounter (Signed)
Remote transmission reviewed, AF/AFL noted on presenting rhythm. Patient made aware and advised it has been forwarded to AF clinic.

## 2022-07-07 NOTE — Telephone Encounter (Signed)
The patient states his pulse is normally in the 50's but been in the 70's. His cardio mobile states his pulse is in the 170's. He sent a transmission yesterday and would like for the nurse to review it. He wants to know what is going on with his heart? I had Leigh, rn review the transmission and speak with the patient.

## 2022-07-08 ENCOUNTER — Ambulatory Visit (HOSPITAL_COMMUNITY)
Admission: RE | Admit: 2022-07-08 | Discharge: 2022-07-08 | Disposition: A | Discharge status: Home / Self Care | Payer: Medicare Other | Source: Ambulatory Visit | Attending: Physician Assistant | Admitting: Physician Assistant

## 2022-07-08 ENCOUNTER — Encounter (HOSPITAL_COMMUNITY): Payer: Self-pay | Admitting: Physician Assistant

## 2022-07-08 VITALS — BP 92/68 | HR 71 | Ht 70.0 in | Wt 185.4 lb

## 2022-07-08 DIAGNOSIS — D6869 Other thrombophilia: Secondary | ICD-10-CM | POA: Diagnosis not present

## 2022-07-08 DIAGNOSIS — R0602 Shortness of breath: Secondary | ICD-10-CM | POA: Insufficient documentation

## 2022-07-08 DIAGNOSIS — I484 Atypical atrial flutter: Secondary | ICD-10-CM | POA: Diagnosis not present

## 2022-07-08 DIAGNOSIS — R600 Localized edema: Secondary | ICD-10-CM | POA: Insufficient documentation

## 2022-07-08 DIAGNOSIS — I4819 Other persistent atrial fibrillation: Secondary | ICD-10-CM | POA: Insufficient documentation

## 2022-07-08 DIAGNOSIS — Z79899 Other long term (current) drug therapy: Secondary | ICD-10-CM | POA: Insufficient documentation

## 2022-07-08 DIAGNOSIS — I480 Paroxysmal atrial fibrillation: Secondary | ICD-10-CM | POA: Diagnosis not present

## 2022-07-08 DIAGNOSIS — Z7901 Long term (current) use of anticoagulants: Secondary | ICD-10-CM | POA: Diagnosis not present

## 2022-07-08 DIAGNOSIS — I422 Other hypertrophic cardiomyopathy: Secondary | ICD-10-CM | POA: Insufficient documentation

## 2022-07-08 DIAGNOSIS — Z95810 Presence of automatic (implantable) cardiac defibrillator: Secondary | ICD-10-CM | POA: Diagnosis not present

## 2022-07-08 LAB — COMPREHENSIVE METABOLIC PANEL
ALT: 30 U/L (ref 0–44)
AST: 26 U/L (ref 15–41)
Albumin: 3.5 g/dL (ref 3.5–5.0)
Alkaline Phosphatase: 68 U/L (ref 38–126)
Anion gap: 9 (ref 5–15)
BUN: 24 mg/dL — ABNORMAL HIGH (ref 8–23)
CO2: 24 mmol/L (ref 22–32)
Calcium: 9.6 mg/dL (ref 8.9–10.3)
Chloride: 106 mmol/L (ref 98–111)
Creatinine, Ser: 1.37 mg/dL — ABNORMAL HIGH (ref 0.61–1.24)
GFR, Estimated: 53 mL/min — ABNORMAL LOW (ref >60)
Glucose, Bld: 125 mg/dL — ABNORMAL HIGH (ref 70–99)
Potassium: 5.5 mmol/L — ABNORMAL HIGH (ref 3.5–5.1)
Sodium: 139 mmol/L (ref 135–145)
Total Bilirubin: 0.9 mg/dL (ref 0.3–1.2)
Total Protein: 6.2 g/dL — ABNORMAL LOW (ref 6.5–8.1)

## 2022-07-08 LAB — TSH: TSH: 0.904 u[IU]/mL (ref 0.350–4.500)

## 2022-07-08 MED ORDER — AMIODARONE HCL 200 MG PO TABS: ORAL_TABLET | ORAL | 0 refills | Status: DC

## 2022-07-08 NOTE — Patient Instructions (Signed)
Stop Sotalol  Wait 3 days to Start Amiodarone '200mg'$  twice daily for 4 weeks then 1 tablet daily

## 2022-07-08 NOTE — Progress Notes (Signed)
Primary Care Physician: Ginger Organ., MD Referring Physician:Dr. Quentin Ore  EP: Dr. Lesia Sago Joseph is a 76 y.o. male with a h/o hypertrophic Morrow, Joseph Morrow, Joseph Morrow, Joseph SR with a very small Morrow burden on Sotalol. Hospitalized for sotalol administration 02/2017. Chadsvasc score of 2(age, LV dysfunction), continues on eliquis 5 mg bid.  Qtc stable.   He asked to be seen today for Morrow noted yesterday. Interrogation of ICD/PPM confirms Morrow on 2/10 and 2/11, mostly starting 2/10 at 11:54 pm and continuing to 5:03 am, 2/11. He states that he drinks alcohol nightly and he often takes his sotalol 14 hours apart. Otherwise, no change from his usual health. Denies snoring. He continues on sotalol.  F/u in Morrow clinic, 02/14/20. I am seeing him as the device clinic noted onset of atrial flutter, rate controlled since  5/30. In the office he continues in rate controlled aflutter. He is mildly symptomatic with this. He continues on sotalol 120 mg bid, and is compliant. He has 2 alcoholic drinks a week. He had been on BB in the past . Stopped when sotalol loaded. Continues on eliquis 5 mg bid for a CHA2DS2VASc score of 3.   F/u in Morrow clinic, 03/11/20, as an urgent work-in as he noted increase in HR last night via Fitbit and confirmed with [name]. If not for seeing his HR elevated, he would not have felt  differently out of rhythm. He is on sotalol 120 mg bid and has been on this for several years. He saw Dr. Rayann Heman in June , was doing ok, but it was mentioned in his note, that if Morrow burden increased, he could consider ablation.  No significant change in health recently. No alcohol, caffeine, tobacco.  By  by the device clinic report this am, he appears to be in an atrial flutter at 117 bpm. BP on arrival 88/70. With a glass of water, BP came up to 104/70. The only medicine he is on is 12.5 metoprolol succinate that would affect BP. This was added in  June on an occasion  when he was out of rhythm. He will usually convert himself.   F/u in Morrow clinic, 07/18/20. He called the clinic earlier in the week, that he was out of rhythm. He does report that he missed all meds Friday pm and went out of rhythm Saturday pm, including his DOAC. He is in an atrial  Flutter, rate controlled. Reading his ablation OP report, he did  have at least 3 flutter circuits that could not be mapped or ablated. No swallowing or groin issues.  F/u in Morrow clinic, 11/11. He has returned to [place]. Ekg shows sinus brady at 57 bpm, with first degree AV block.  PR int 236 ms, qrs int 66 ms, qtc 428 ms. He feels well.   F/u 08/02/20. He is now one month out from  ablation and has been in [place] for the last 2 weeks. He feels improved. No issues today.   F/u Morrow clinic 07/24/21. He went out of rhythm  over the weekend. He has noted more exertional dyspnea and fatigue with this episode. He had vertigo last week with dizziness and N/V with PCP treating with Antivert. It is now better. He has atrial flutter with RVR at 111 bpm.   F/u 08/04/21. He is in the clinic as he went out of rhythm yesterday with RVR at times. He hs noted some H/A at times since being  out of rhythm. He just had a successful cardioversion 11/16 but now with ERAF. His ablation was last October.  Because of hypertrophic Morrow, his best drug choice would be amiodarone if sotalol was stopped.   F/u 10/21/21 for one month Joseph ablation. He is Joseph  SR. He feels well. He had a bruise left thigh but it is resolved . No swallowing issues. Remains on sotalol and metoprolol . No issues with anticoagultion.   F/u in the Morrow clinic, 04/20/22. He has had ongoing aflutter with controlled rates since 7/15 as notified by the device clinic. He is minimally symptomatic with this. No missed anticoagulation. Only trigger that pt  reports is that he developed vertigo and took several doses of meclizine, he developed severe  constipation after this, now resolved. He is in agreement to  schedule for cardioversion.  F/u cardioversion, 05/04/22. He had a successful cardioversion 8/14 and is now back for f/u. EKG shows sinus brady with first degree block at 56 bpm.  Follow up in the AF clinic 07/08/22. The device clinic received an alert for an ongoing Morrow episode starting 10/19. He has noted a little more SOB going up stairs and some mild lower extremity edema. There were no specific triggers for his Morrow that he could identify.   Today, he denies symptoms of  palpitations, chest pain, orthopnea, PND, dizziness, presyncope, syncope, or neurologic sequela.  Positive for palpitations..The patient is tolerating medications without difficulties and is otherwise without complaint today.   Past Medical History:  Diagnosis Date   Acute cholecystitis 06/21/2011   Allergy    SEASONAL   Cataract    Complication of anesthesia    HALLUCINATIONS   ED (erectile dysfunction)    GERD (gastroesophageal reflux disease)    Hearing loss    Helicobacter pylori gastritis 06/2014   Hemorrhoids    Hiatal hernia    Hx of adenomatous colonic polyps 06/2014   Hypertrophic Morrow (HCC)    Mitral regurgitation    Moderate   Osteopenia    Paroxysmal atrial fibrillation ([place]) 04/2015   documented on ICD Joseph interrogation, treated with sotalol   Septal defect    LV Septal hyptertrophy 64m, normal EF, minimal LVOT gradiant.   Tubular adenoma of colon    Morrow (ventricular tachycardia) ([place] 08/18/2013   Joseph St. Jude ICD   Past Surgical History:  Procedure Laterality Date   APPENDECTOMY  1950   ATRIAL FIBRILLATION ABLATION N/A 07/04/2020   Procedure: ATRIAL FIBRILLATION ABLATION;  Surgeon: AThompson Grayer MD;  [facility];  Service: Cardiovascular;  Laterality: N/A;   ATRIAL FIBRILLATION ABLATION N/A 09/18/2021   Procedure: ATRIAL FIBRILLATION ABLATION;  Surgeon: LVickie Epley MD;  [facility];  Service: Cardiovascular;  Laterality: N/A;   CARDIOVERSION N/A 02/25/2017   Procedure: CARDIOVERSION;  Surgeon: RFay Records MD;  [facility];  Service: Cardiovascular;  Laterality: N/A;   CARDIOVERSION N/A 07/30/2021   Procedure: CARDIOVERSION;  Surgeon: BJanina Mayo MD;  [place] [facility]  Service: Cardiovascular;  Laterality: N/A;   CARDIOVERSION N/A 04/27/2022   Procedure: CARDIOVERSION;  Surgeon: SJerline Pain MD;  [place] [facility]  Service: Cardiovascular;  Laterality: N/A;   CATARACT EXTRACTION, BILATERAL     CHOLECYSTECTOMY  06/24/2011   COLONOSCOPY     EP IMPLANTABLE DEVICE N/A 09/03/2016   SJM Fortify [address] Dr generator change by Dr ARayann Heman  ICD implanted for secondary prevention of sudden death   IMPLANTABLE CARDIOVERTER DEFIBRILLATOR  IMPLANT  08/18/2013   St. Jude Fortify Assura DR ICD implanted by Dr Rayann Heman for secondary treatment of Morrow   IMPLANTABLE CARDIOVERTER DEFIBRILLATOR IMPLANT N/A 08/18/2013   Procedure: IMPLANTABLE CARDIOVERTER DEFIBRILLATOR IMPLANT;  Surgeon: Coralyn Mark, MD;  Location[facility];  Service: Cardiovascular;  Laterality: N/A;   TEE WITHOUT CARDIOVERSION N/A 07/30/2021   Procedure: TRANSESOPHAGEAL ECHOCARDIOGRAM (TEE);  Surgeon: Janina Mayo, MD;  Location: [facility] ENDOSCOPY;  Service: Cardiovascular;  Laterality: N/A;    Current Outpatient Medications  Medication Sig Dispense Refill   acetaminophen (TYLENOL) 500 MG tablet Take 500-1,000 mg by mouth every 6 (six) hours as needed (pain.).     apixaban (ELIQUIS) 5 MG TABS tablet TAKE 1 TABLET BY MOUTH TWICE A DAY 60 tablet 5   calcium carbonate (OSCAL) 1500 (600 Ca) MG TABS tablet Take 600 mg by mouth in the morning and at bedtime.     calcium carbonate (TUMS EX) 750 MG chewable tablet Chew 1 tablet by mouth 3 (three) times daily as needed for heartburn.     Cholecalciferol (VITAMIN D3) 50 MCG (2000 UT) TABS Take 2,000 Units by mouth in the morning and at  bedtime.     Coenzyme Q10 (CO Q 10) 100 MG CAPS Take 100 mg by mouth in the morning.     diltiazem (CARDIZEM) 30 MG tablet Cardizem '30mg'$  -- take 1 tablet every 4 hours AS NEEDED for heart rate >100 30 tablet 3   furosemide (LASIX) 20 MG tablet Take 1 tablet by mouth daily until 11/16 then only as needed for swelling/weight gain 30 tablet 11   Glucosamine-Chondroitin (COSAMIN DS PO) Take 1 tablet by mouth in the morning.     metoprolol succinate (TOPROL-XL) 50 MG 24 hr tablet TAKE 1 TABLET TWICE A DAY WITH OR IMMEDIATELY FOLLOWING A MEAL 180 tablet 3   omeprazole (PRILOSEC OTC) 20 MG tablet Take 10 mg by mouth in the morning.     potassium citrate (UROCIT-K) 10 MEQ (1080 MG) SR tablet Take 1 tablet (10 mEq total) by mouth daily as needed (with lasix (swelling/weight gain)). 30 tablet 11   Probiotic Product (ALIGN PO) Take 1 capsule by mouth in the morning.     Simethicone (GAS-X ULTRA STRENGTH) 180 MG CAPS Take 180 mg by mouth 2 (two) times daily.     Zinc 50 MG CAPS Take 50 mg by mouth 2 (two) times daily.      No current facility-administered medications for this encounter.    Allergies  Allergen Reactions   Dextromethorphan Other (See Comments)    nervous   Meclizine     Caused increased heart rate and constipation   Versed [Midazolam] Other (See Comments)    "hallucinations, headache"   Zofran [Ondansetron]     Caused increased heart rate and constipation   Antihistamines, Diphenhydramine-Type Anxiety    Social History   Socioeconomic History   Marital status: Married    Spouse name: Not on file   Number of children: 2   Years of education: Not on file   Highest education level: Not on file  Occupational History   Occupation: retired  Tobacco Use   Smoking status: Former    Packs/day: 2.00    Years: 20.00    Total pack years: 40.00    Types: Cigarettes    Quit date: 07/06/1981    Years since quitting: 41.0   Smokeless tobacco: Never   Tobacco comments:    Former  smoker 10/21/21  Vaping Use  Vaping Use: Never used  Substance and Sexual Activity   Alcohol use: Not Currently    Comment: stop drinking 2 glasses of wine since ablation 10/21/21   Drug use: No   Sexual activity: Not on file  Other Topics Concern   Not on file  Social History Narrative   Originally from [place]. Moved to [place] in 2012. Currently has no pets. No bird or hot tub exposure. 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 He did have exposure to Beneze, Acetone, Styrene, & Hydrocarbon early in his career.   Social Determinants of Health   Financial Resource Strain: Not on file  Food Insecurity: Not on file  Transportation Needs: Not on file  Physical Activity: Not on file  Stress: Not on file  Social Connections: Not on file  Intimate Partner Violence: Not on file    Family History  Problem Relation Age of Onset   Hypertrophic Morrow Mother    Arrhythmia Mother    Heart failure Mother    Pneumonia Father    Diabetes Father    Colon polyps Father    Heart attack Brother    Sleep apnea Brother    Hypertrophic Morrow Brother    Heart Problems Brother        VAD   Arrhythmia Brother    Heart Problems Brother    Diabetes Brother    Sudden death Brother    Lung disease Neg Hx     ROS- All systems are reviewed and negative except as per the HPI above  Physical Exam: Vitals:   07/08/22 0834  BP: 92/68  Pulse: 71  Weight: 84.1 kg  Height: '5\' 10"'$  (1.778 m)    Wt Readings from Last 3 Encounters:  07/08/22 84.1 kg  05/04/22 83.6 kg  04/27/22 78.5 kg    Labs: Lab Results  Component Value Date   NA 139 04/20/2022   K 4.2 04/20/2022   CL 104 04/20/2022   CO2 25 04/20/2022   GLUCOSE 135 (H) 04/20/2022   BUN 21 04/20/2022   CREATININE 1.20 04/20/2022   CALCIUM 9.4 04/20/2022   MG 1.8 04/20/2022   Lab Results  Component Value Date   INR 1.2 10/10/2021   No results found for: "CHOL", "HDL", "LDLCALC",  "TRIG"   GEN- The patient is a well appearing elderly male, alert and oriented x 3 today.   HEENT-head normocephalic, atraumatic, sclera clear, conjunctiva pink, hearing intact, trachea midline. Lungs- Clear to ausculation bilaterally, normal work of breathing Heart- irregular rate and rhythm, no murmurs, rubs or gallops  GI- soft, NT, ND, + BS Extremities- no clubbing, cyanosis, or edema MS- no significant deformity or atrophy Skin- no rash or lesion Psych- euthymic mood, full affect Neuro- strength and sensation are intact   ECG today demonstrates  Atypical atrial flutter with intermittent V pacing Vent. rate 71 BPM PR interval 258 ms QRS duration 70 ms QT/QTcB 342/371 ms    1. Hypertrophic Morrow, sigmoid subtype. Basal septum measures 18 mm in maximal dimension. Flow acceleration noted at the LVOT, no LVOT obstruction noted. Chordal systolic anterior motion of the mitral valve without involvment of the anterior leaflet.   2. Left ventricular ejection fraction, by visual estimation, is 60 to  65%. The left ventricle has normal function. There is severely increased  left ventricular hypertrophy.   3. Elevated left atrial and left ventricular end-diastolic pressures.   4. Left ventricular diastolic parameters are consistent with Grade III  diastolic  dysfunction (restrictive).   5. Global right ventricle has normal systolic function.The right  ventricular size is normal. No increase in right ventricular wall  thickness.   6. Left atrial size was moderately dilated.   7. Right atrial size was moderately dilated.   8. The mitral valve is normal in structure. Mild mitral valve  regurgitation. No evidence of mitral stenosis.   9. The tricuspid valve is normal in structure. Tricuspid valve  regurgitation is mild.  10. The aortic valve is tricuspid. Aortic valve regurgitation is not  visualized. No evidence of aortic valve sclerosis or stenosis.  11. The pulmonic valve was  normal in structure. Pulmonic valve  regurgitation is mild.  12. Moderately elevated pulmonary artery systolic pressure.  13. The inferior vena cava is dilated in size with <50% respiratory  variability, suggesting right atrial pressure of 15 mmHg.  14. The left ventricle has no regional wall motion abnormalities.     Assessment and Plan: 1. Atrial flutter  Joseph  ablation 06/2020 with redo ablation 09/2021   Joseph DCCV 04/2022 and now back in atrial flutter.  We discussed rhythm control options including repeat DCCV vs alternate AAD. After discussing the risks and benefits, will start amiodarone 200 mg BID x 4 weeks then decrease to once daily. He will stop sotalol and start amiodarone after a 3 day washout. If he does not chemically convert with amiodarone will arrange for DCCV.  Check TSH/cmet today. CXR from earlier this year reviewed.  Continue Toprol 50 mg bid  Continue eliquis 5 mg bid for a CHA2DS2VASc score of at least 3   2. HCM St Jude -Merlin ICD, followed by Dr Quentin Ore and the device clinic.   Follow up in the AF clinic in 2 weeks.    [facility] [address] [place], [place] [address] [phone]

## 2022-07-16 ENCOUNTER — Encounter (HOSPITAL_COMMUNITY): Payer: Self-pay

## 2022-07-16 ENCOUNTER — Telehealth: Payer: Self-pay

## 2022-07-16 NOTE — Telephone Encounter (Signed)
Alert remote reviewed. Normal device function.   Known atrial fib, on OAC, AF burden is 24% of the time, AF is ongoing There was one VT monitor only arrhythmia and one SVT, sent to triage  Pt transitioned from sotalol to amiodarone.  Pt with breakthrough arrhythmias during this time.

## 2022-07-16 NOTE — Telephone Encounter (Signed)
Pt responded via MyChart with understanding of medication change.

## 2022-07-16 NOTE — Telephone Encounter (Signed)
Discussed with Dr. Quentin Ore.  Strips reviewed.   Will have Pt increase amiodarone to 400 mg PO BID x 5 days.  Then reduce to 200 mg PO BID x 3 weeks.  Then reduce to 200 mg PO daily.  Left detailed message with above information.  Sent [employer].

## 2022-07-22 ENCOUNTER — Ambulatory Visit (HOSPITAL_COMMUNITY)
Admission: RE | Admit: 2022-07-22 | Discharge: 2022-07-22 | Disposition: A | Discharge status: Home / Self Care | Payer: Medicare Other | Source: Ambulatory Visit | Attending: Nurse Practitioner | Admitting: Nurse Practitioner

## 2022-07-22 VITALS — BP 110/74 | HR 72 | Ht 70.0 in | Wt 183.8 lb

## 2022-07-22 DIAGNOSIS — I484 Atypical atrial flutter: Secondary | ICD-10-CM | POA: Diagnosis not present

## 2022-07-22 LAB — BASIC METABOLIC PANEL
Anion gap: 7 (ref 5–15)
BUN: 22 mg/dL (ref 8–23)
CO2: 24 mmol/L (ref 22–32)
Calcium: 9.1 mg/dL (ref 8.9–10.3)
Chloride: 105 mmol/L (ref 98–111)
Creatinine, Ser: 1.37 mg/dL — ABNORMAL HIGH (ref 0.61–1.24)
GFR, Estimated: 53 mL/min — ABNORMAL LOW (ref >60)
Glucose, Bld: 154 mg/dL — ABNORMAL HIGH (ref 70–99)
Potassium: 4.4 mmol/L (ref 3.5–5.1)
Sodium: 136 mmol/L (ref 135–145)

## 2022-07-22 NOTE — Progress Notes (Signed)
Pt is in for EKG as he is now loading on amiodarone 200 mg bid . He is tolerating well. He remains in aflutter at 72 bpm. No missed anticoagulation. Bmet repeated as K+ was elevated   Vent. rate 72 BPM PR interval 286 ms QRS duration 70 ms QT/QTcB 202/221 ms P-R-T axes 163 105 251  Suspect arm lead reversal, interpretation assumes no reversal Unusual P axis, possible ectopic atrial rhythm Possible Lateral infarct , age undetermined Marked ST abnormality, possible inferior subendocardial injury Abnormal ECG When compared with ECG of 08-Jul-2022 08:36, PREVIOUS ECG IS PRESENTon last visit. He will return in 2 weeks to be scheduled for cardioversion.

## 2022-07-27 DIAGNOSIS — R293 Abnormal posture: Secondary | ICD-10-CM | POA: Diagnosis not present

## 2022-07-27 DIAGNOSIS — M5137 Other intervertebral disc degeneration, lumbosacral region: Secondary | ICD-10-CM | POA: Diagnosis not present

## 2022-07-27 DIAGNOSIS — M9903 Segmental and somatic dysfunction of lumbar region: Secondary | ICD-10-CM | POA: Diagnosis not present

## 2022-07-27 DIAGNOSIS — M9902 Segmental and somatic dysfunction of thoracic region: Secondary | ICD-10-CM | POA: Diagnosis not present

## 2022-07-27 DIAGNOSIS — M791 Myalgia, unspecified site: Secondary | ICD-10-CM | POA: Diagnosis not present

## 2022-07-27 DIAGNOSIS — M50222 Other cervical disc displacement at C5-C6 level: Secondary | ICD-10-CM | POA: Diagnosis not present

## 2022-07-27 DIAGNOSIS — M5459 Other low back pain: Secondary | ICD-10-CM | POA: Diagnosis not present

## 2022-07-27 DIAGNOSIS — M9901 Segmental and somatic dysfunction of cervical region: Secondary | ICD-10-CM | POA: Diagnosis not present

## 2022-07-27 DIAGNOSIS — M542 Cervicalgia: Secondary | ICD-10-CM | POA: Diagnosis not present

## 2022-08-04 DIAGNOSIS — Z23 Encounter for immunization: Secondary | ICD-10-CM | POA: Diagnosis not present

## 2022-08-10 ENCOUNTER — Encounter (HOSPITAL_COMMUNITY): Payer: Self-pay

## 2022-08-10 DIAGNOSIS — R058 Other specified cough: Secondary | ICD-10-CM | POA: Diagnosis not present

## 2022-08-10 DIAGNOSIS — Z1152 Encounter for screening for COVID-19: Secondary | ICD-10-CM | POA: Diagnosis not present

## 2022-08-10 DIAGNOSIS — J209 Acute bronchitis, unspecified: Secondary | ICD-10-CM | POA: Diagnosis not present

## 2022-08-10 DIAGNOSIS — I480 Paroxysmal atrial fibrillation: Secondary | ICD-10-CM | POA: Diagnosis not present

## 2022-08-11 ENCOUNTER — Ambulatory Visit (HOSPITAL_COMMUNITY): Payer: Medicare Other | Admitting: Nurse Practitioner

## 2022-08-13 ENCOUNTER — Encounter (HOSPITAL_COMMUNITY): Payer: Self-pay | Admitting: *Deleted

## 2022-08-13 ENCOUNTER — Inpatient Hospital Stay (HOSPITAL_COMMUNITY): Payer: Medicare Other

## 2022-08-13 ENCOUNTER — Other Ambulatory Visit: Payer: Self-pay

## 2022-08-13 ENCOUNTER — Observation Stay (HOSPITAL_COMMUNITY)
Admission: EM | Admit: 2022-08-13 | Discharge: 2022-08-15 | Disposition: A | Discharge status: Home / Self Care | Payer: Medicare Other | Attending: Family Medicine | Admitting: Family Medicine

## 2022-08-13 ENCOUNTER — Emergency Department (HOSPITAL_COMMUNITY): Payer: Medicare Other

## 2022-08-13 DIAGNOSIS — J121 Respiratory syncytial virus pneumonia: Secondary | ICD-10-CM | POA: Diagnosis not present

## 2022-08-13 DIAGNOSIS — R2681 Unsteadiness on feet: Secondary | ICD-10-CM | POA: Insufficient documentation

## 2022-08-13 DIAGNOSIS — M47812 Spondylosis without myelopathy or radiculopathy, cervical region: Secondary | ICD-10-CM | POA: Diagnosis not present

## 2022-08-13 DIAGNOSIS — G9341 Metabolic encephalopathy: Secondary | ICD-10-CM | POA: Insufficient documentation

## 2022-08-13 DIAGNOSIS — R270 Ataxia, unspecified: Secondary | ICD-10-CM

## 2022-08-13 DIAGNOSIS — D6869 Other thrombophilia: Secondary | ICD-10-CM | POA: Insufficient documentation

## 2022-08-13 DIAGNOSIS — R7401 Elevation of levels of liver transaminase levels: Secondary | ICD-10-CM | POA: Insufficient documentation

## 2022-08-13 DIAGNOSIS — Z95810 Presence of automatic (implantable) cardiac defibrillator: Secondary | ICD-10-CM | POA: Insufficient documentation

## 2022-08-13 DIAGNOSIS — I4819 Other persistent atrial fibrillation: Secondary | ICD-10-CM | POA: Diagnosis present

## 2022-08-13 DIAGNOSIS — Z1152 Encounter for screening for COVID-19: Secondary | ICD-10-CM | POA: Diagnosis not present

## 2022-08-13 DIAGNOSIS — F515 Nightmare disorder: Secondary | ICD-10-CM | POA: Diagnosis not present

## 2022-08-13 DIAGNOSIS — J189 Pneumonia, unspecified organism: Secondary | ICD-10-CM

## 2022-08-13 DIAGNOSIS — R519 Headache, unspecified: Secondary | ICD-10-CM | POA: Insufficient documentation

## 2022-08-13 DIAGNOSIS — R260 Ataxic gait: Secondary | ICD-10-CM | POA: Insufficient documentation

## 2022-08-13 DIAGNOSIS — R531 Weakness: Secondary | ICD-10-CM | POA: Diagnosis not present

## 2022-08-13 DIAGNOSIS — N1831 Chronic kidney disease, stage 3a: Secondary | ICD-10-CM | POA: Diagnosis not present

## 2022-08-13 DIAGNOSIS — R269 Unspecified abnormalities of gait and mobility: Secondary | ICD-10-CM | POA: Diagnosis not present

## 2022-08-13 DIAGNOSIS — R059 Cough, unspecified: Secondary | ICD-10-CM | POA: Diagnosis not present

## 2022-08-13 DIAGNOSIS — I484 Atypical atrial flutter: Secondary | ICD-10-CM | POA: Insufficient documentation

## 2022-08-13 DIAGNOSIS — G319 Degenerative disease of nervous system, unspecified: Secondary | ICD-10-CM | POA: Diagnosis not present

## 2022-08-13 DIAGNOSIS — Z79899 Other long term (current) drug therapy: Secondary | ICD-10-CM | POA: Diagnosis not present

## 2022-08-13 DIAGNOSIS — R4182 Altered mental status, unspecified: Secondary | ICD-10-CM | POA: Diagnosis not present

## 2022-08-13 DIAGNOSIS — I422 Other hypertrophic cardiomyopathy: Secondary | ICD-10-CM | POA: Insufficient documentation

## 2022-08-13 DIAGNOSIS — R29898 Other symptoms and signs involving the musculoskeletal system: Secondary | ICD-10-CM | POA: Diagnosis not present

## 2022-08-13 DIAGNOSIS — I4891 Unspecified atrial fibrillation: Secondary | ICD-10-CM | POA: Diagnosis present

## 2022-08-13 DIAGNOSIS — F518 Other sleep disorders not due to a substance or known physiological condition: Secondary | ICD-10-CM | POA: Diagnosis not present

## 2022-08-13 DIAGNOSIS — F419 Anxiety disorder, unspecified: Secondary | ICD-10-CM | POA: Insufficient documentation

## 2022-08-13 DIAGNOSIS — I480 Paroxysmal atrial fibrillation: Secondary | ICD-10-CM | POA: Insufficient documentation

## 2022-08-13 DIAGNOSIS — Z7901 Long term (current) use of anticoagulants: Secondary | ICD-10-CM | POA: Insufficient documentation

## 2022-08-13 DIAGNOSIS — I129 Hypertensive chronic kidney disease with stage 1 through stage 4 chronic kidney disease, or unspecified chronic kidney disease: Secondary | ICD-10-CM | POA: Diagnosis not present

## 2022-08-13 DIAGNOSIS — Z87891 Personal history of nicotine dependence: Secondary | ICD-10-CM | POA: Diagnosis not present

## 2022-08-13 DIAGNOSIS — I483 Typical atrial flutter: Secondary | ICD-10-CM | POA: Diagnosis present

## 2022-08-13 LAB — RESPIRATORY PANEL BY PCR

## 2022-08-13 LAB — URINALYSIS, ROUTINE W REFLEX MICROSCOPIC
Bacteria, UA: NONE SEEN
Bilirubin Urine: NEGATIVE
Glucose, UA: NEGATIVE mg/dL
Hgb urine dipstick: NEGATIVE
Ketones, ur: 5 mg/dL — AB
Leukocytes,Ua: NEGATIVE
Nitrite: NEGATIVE
Protein, ur: 30 mg/dL — AB
Specific Gravity, Urine: 1.027 (ref 1.005–1.030)
pH: 5 (ref 5.0–8.0)

## 2022-08-13 LAB — I-STAT CHEM 8, ED
BUN: 22 mg/dL (ref 8–23)
Calcium, Ion: 1.12 mmol/L — ABNORMAL LOW (ref 1.15–1.40)
Chloride: 103 mmol/L (ref 98–111)
Creatinine, Ser: 1.4 mg/dL — ABNORMAL HIGH (ref 0.61–1.24)
Glucose, Bld: 100 mg/dL — ABNORMAL HIGH (ref 70–99)
HCT: 41 % (ref 39.0–52.0)
Hemoglobin: 13.9 g/dL (ref 13.0–17.0)
Potassium: 3.9 mmol/L (ref 3.5–5.1)
Sodium: 140 mmol/L (ref 135–145)
TCO2: 24 mmol/L (ref 22–32)

## 2022-08-13 LAB — PROTIME-INR
INR: 1.5 — ABNORMAL HIGH (ref 0.8–1.2)
Prothrombin Time: 18.4 seconds — ABNORMAL HIGH (ref 11.4–15.2)

## 2022-08-13 LAB — DIFFERENTIAL
Abs Immature Granulocytes: 0.03 10*3/uL (ref 0.00–0.07)
Basophils Absolute: 0.1 10*3/uL (ref 0.0–0.1)
Basophils Relative: 1 %
Eosinophils Absolute: 0.2 10*3/uL (ref 0.0–0.5)
Eosinophils Relative: 2 %
Immature Granulocytes: 0 %
Lymphocytes Relative: 9 %
Lymphs Abs: 0.8 10*3/uL (ref 0.7–4.0)
Monocytes Absolute: 0.5 10*3/uL (ref 0.1–1.0)
Monocytes Relative: 6 %
Neutro Abs: 7 10*3/uL (ref 1.7–7.7)
Neutrophils Relative %: 82 %

## 2022-08-13 LAB — CBC
HCT: 43.6 % (ref 39.0–52.0)
Hemoglobin: 14.8 g/dL (ref 13.0–17.0)
MCH: 30.1 pg (ref 26.0–34.0)
MCHC: 33.9 g/dL (ref 30.0–36.0)
MCV: 88.6 fL (ref 80.0–100.0)
Platelets: 174 10*3/uL (ref 150–400)
RBC: 4.92 MIL/uL (ref 4.22–5.81)
RDW: 13.5 % (ref 11.5–15.5)
WBC: 8.5 10*3/uL (ref 4.0–10.5)
nRBC: 0 % (ref 0.0–0.2)

## 2022-08-13 LAB — COMPREHENSIVE METABOLIC PANEL
ALT: 62 U/L — ABNORMAL HIGH (ref 0–44)
AST: 43 U/L — ABNORMAL HIGH (ref 15–41)
Albumin: 3.3 g/dL — ABNORMAL LOW (ref 3.5–5.0)
Alkaline Phosphatase: 74 U/L (ref 38–126)
Anion gap: 9 (ref 5–15)
BUN: 21 mg/dL (ref 8–23)
CO2: 24 mmol/L (ref 22–32)
Calcium: 8.7 mg/dL — ABNORMAL LOW (ref 8.9–10.3)
Chloride: 105 mmol/L (ref 98–111)
Creatinine, Ser: 1.4 mg/dL — ABNORMAL HIGH (ref 0.61–1.24)
GFR, Estimated: 52 mL/min — ABNORMAL LOW (ref >60)
Glucose, Bld: 110 mg/dL — ABNORMAL HIGH (ref 70–99)
Potassium: 3.8 mmol/L (ref 3.5–5.1)
Sodium: 138 mmol/L (ref 135–145)
Total Bilirubin: 0.7 mg/dL (ref 0.3–1.2)
Total Protein: 6.1 g/dL — ABNORMAL LOW (ref 6.5–8.1)

## 2022-08-13 LAB — LACTIC ACID, PLASMA
Lactic Acid, Venous: 1.3 mmol/L (ref 0.5–1.9)
Lactic Acid, Venous: 1.8 mmol/L (ref 0.5–1.9)

## 2022-08-13 LAB — SARS CORONAVIRUS 2 BY RT PCR: SARS Coronavirus 2 by RT PCR: NEGATIVE

## 2022-08-13 LAB — CBG MONITORING, ED: Glucose-Capillary: 99 mg/dL (ref 70–99)

## 2022-08-13 LAB — VITAMIN B12: Vitamin B-12: 835 pg/mL (ref 180–914)

## 2022-08-13 LAB — PROCALCITONIN: Procalcitonin: <0.1 ng/mL

## 2022-08-13 LAB — APTT: aPTT: 38 seconds — ABNORMAL HIGH (ref 24–36)

## 2022-08-13 LAB — ETHANOL: Alcohol, Ethyl (B): <10 mg/dL (ref <10)

## 2022-08-13 MED ORDER — PROMETHAZINE HCL 12.5 MG PO TABS: 12.5000 mg | ORAL_TABLET | Freq: Four times a day (QID) | ORAL | Status: DC | PRN

## 2022-08-13 MED ORDER — FUROSEMIDE 20 MG PO TABS: 20.0000 mg | ORAL_TABLET | Freq: Every day | ORAL | Status: DC | PRN

## 2022-08-13 MED ORDER — APIXABAN 5 MG PO TABS: 5.0000 mg | ORAL_TABLET | Freq: Two times a day (BID) | ORAL | Status: DC

## 2022-08-13 MED ORDER — LORAZEPAM 2 MG/ML IJ SOLN
0.2500 mg | Freq: Once | INTRAMUSCULAR | Status: AC
  Administered 2022-08-13: 0.25 mg via INTRAVENOUS
  Filled 2022-08-13: qty 1

## 2022-08-13 MED ORDER — FUROSEMIDE 20 MG PO TABS
20.0000 mg | ORAL_TABLET | Freq: Once | ORAL | Status: AC
  Administered 2022-08-13: 20 mg via ORAL
  Filled 2022-08-13: qty 1

## 2022-08-13 MED ORDER — ACETAMINOPHEN 650 MG RE SUPP: 650.0000 mg | Freq: Four times a day (QID) | RECTAL | Status: DC | PRN

## 2022-08-13 MED ORDER — DIPHENHYDRAMINE HCL 50 MG/ML IJ SOLN
25.0000 mg | Freq: Once | INTRAMUSCULAR | Status: AC
  Administered 2022-08-13: 25 mg via INTRAVENOUS
  Filled 2022-08-13: qty 1

## 2022-08-13 MED ORDER — SODIUM CHLORIDE 0.9 % IV SOLN
500.0000 mg | Freq: Once | INTRAVENOUS | Status: AC
  Administered 2022-08-13: 500 mg via INTRAVENOUS
  Filled 2022-08-13: qty 5

## 2022-08-13 MED ORDER — ALBUTEROL SULFATE (2.5 MG/3ML) 0.083% IN NEBU: 2.5000 mg | INHALATION_SOLUTION | Freq: Four times a day (QID) | RESPIRATORY_TRACT | Status: DC | PRN

## 2022-08-13 MED ORDER — ACETAMINOPHEN 325 MG PO TABS
650.0000 mg | ORAL_TABLET | Freq: Four times a day (QID) | ORAL | Status: DC | PRN
  Administered 2022-08-15: 650 mg via ORAL
  Filled 2022-08-13: qty 2

## 2022-08-13 MED ORDER — SODIUM CHLORIDE 0.9% FLUSH
3.0000 mL | Freq: Two times a day (BID) | INTRAVENOUS | Status: DC
  Administered 2022-08-13 – 2022-08-15 (×5): 3 mL via INTRAVENOUS

## 2022-08-13 MED ORDER — GUAIFENESIN ER 600 MG PO TB12
600.0000 mg | ORAL_TABLET | Freq: Two times a day (BID) | ORAL | Status: DC
  Administered 2022-08-13 – 2022-08-15 (×5): 600 mg via ORAL
  Filled 2022-08-13 (×5): qty 1

## 2022-08-13 MED ORDER — OMEPRAZOLE 20 MG PO CPDR
20.0000 mg | DELAYED_RELEASE_CAPSULE | ORAL | Status: DC
  Administered 2022-08-13 – 2022-08-15 (×3): 20 mg via ORAL
  Filled 2022-08-13 (×5): qty 1

## 2022-08-13 MED ORDER — SODIUM CHLORIDE 0.9% FLUSH
3.0000 mL | Freq: Once | INTRAVENOUS | Status: AC
  Administered 2022-08-13: 3 mL via INTRAVENOUS

## 2022-08-13 MED ORDER — LORAZEPAM 2 MG/ML IJ SOLN: 0.2500 mg | Freq: Four times a day (QID) | INTRAMUSCULAR | Status: DC | PRN

## 2022-08-13 MED ORDER — LORAZEPAM 2 MG/ML IJ SOLN
INTRAMUSCULAR | Status: AC
  Administered 2022-08-13: 0.25 mg via INTRAVENOUS
  Filled 2022-08-13: qty 1

## 2022-08-13 MED ORDER — LORAZEPAM 2 MG/ML IJ SOLN: 0.2500 mg | Freq: Once | INTRAMUSCULAR | Status: AC

## 2022-08-13 MED ORDER — AMIODARONE HCL 200 MG PO TABS
200.0000 mg | ORAL_TABLET | Freq: Every day | ORAL | Status: DC
  Administered 2022-08-13 – 2022-08-15 (×3): 200 mg via ORAL
  Filled 2022-08-13 (×3): qty 1

## 2022-08-13 MED ORDER — SODIUM CHLORIDE 0.9 % IV SOLN
2.0000 g | Freq: Once | INTRAVENOUS | Status: AC
  Administered 2022-08-13: 2 g via INTRAVENOUS
  Filled 2022-08-13: qty 20

## 2022-08-13 MED ORDER — RISAQUAD PO CAPS
1.0000 | ORAL_CAPSULE | Freq: Every morning | ORAL | Status: DC
  Administered 2022-08-13 – 2022-08-15 (×3): 1 via ORAL
  Filled 2022-08-13 (×5): qty 1

## 2022-08-13 MED ORDER — APIXABAN 5 MG PO TABS
5.0000 mg | ORAL_TABLET | Freq: Two times a day (BID) | ORAL | Status: DC
  Administered 2022-08-13: 5 mg via ORAL
  Filled 2022-08-13: qty 1

## 2022-08-13 MED ORDER — POTASSIUM CHLORIDE CRYS ER 10 MEQ PO TBCR: 10.0000 meq | EXTENDED_RELEASE_TABLET | Freq: Every day | ORAL | Status: DC | PRN

## 2022-08-13 MED ORDER — METOPROLOL SUCCINATE ER 50 MG PO TB24
50.0000 mg | ORAL_TABLET | Freq: Two times a day (BID) | ORAL | Status: DC
  Administered 2022-08-13 – 2022-08-15 (×5): 50 mg via ORAL
  Filled 2022-08-13: qty 1
  Filled 2022-08-13: qty 2
  Filled 2022-08-13: qty 1
  Filled 2022-08-13 (×2): qty 2

## 2022-08-13 MED ORDER — PROCHLORPERAZINE EDISYLATE 10 MG/2ML IJ SOLN
10.0000 mg | Freq: Once | INTRAMUSCULAR | Status: AC
  Administered 2022-08-13: 10 mg via INTRAVENOUS
  Filled 2022-08-13: qty 2

## 2022-08-13 MED ORDER — THIAMINE HCL 100 MG/ML IJ SOLN
500.0000 mg | Freq: Three times a day (TID) | INTRAVENOUS | Status: DC
  Administered 2022-08-13 – 2022-08-14 (×4): 500 mg via INTRAVENOUS
  Filled 2022-08-13 (×7): qty 5

## 2022-08-13 MED ORDER — LORAZEPAM 2 MG/ML IJ SOLN: 0.5000 mg | Freq: Once | INTRAMUSCULAR | Status: DC | PRN

## 2022-08-13 MED ORDER — SODIUM CHLORIDE 0.9 % IV BOLUS
1000.0000 mL | Freq: Once | INTRAVENOUS | Status: AC
  Administered 2022-08-13: 1000 mL via INTRAVENOUS

## 2022-08-13 MED ORDER — DILTIAZEM HCL 30 MG PO TABS: 30.0000 mg | ORAL_TABLET | ORAL | Status: DC | PRN

## 2022-08-13 NOTE — ED Notes (Signed)
99 CBG

## 2022-08-13 NOTE — Progress Notes (Signed)
Same day no charge note:  Subjective:  On further history, the wife reports he is actually not had any of his steroids for the past 3 days.  Despite this his symptoms have actually been worsening, which argues against a diagnosis of steroid side effects. She feels that his gait has worsened even over the course of the day  Patient was unable to tolerate full MRI sequences due to anxiety, patient and wife note he has never had this kind of anxiety before and has tolerated MRIs in the past without any need for sedation  Objective  Current vital signs: BP (!) 103/53   Pulse 89   Temp 98.3 F (36.8 C) (Oral)   Resp (!) 29   Ht '5\' 10"'$  (1.778 m)   Wt 83.4 kg   SpO2 94%   BMI 26.38 kg/m  Vital signs in last 24 hours: Temp:  [97.9 F (36.6 C)-98.3 F (36.8 C)] 98.3 F (36.8 C) (11/30 1606) Pulse Rate:  [47-124] 89 (11/30 1900) Resp:  [15-33] 29 (11/30 1900) BP: (103-132)/(53-93) 103/53 (11/30 1900) SpO2:  [92 %-99 %] 94 % (11/30 1900) Weight:  [83.4 kg] 83.4 kg (11/30 7026)   Basic Metabolic Panel: Recent Labs  Lab 08/13/22 0225 08/13/22 0503  NA 138 140  K 3.8 3.9  CL 105 103  CO2 24  --   GLUCOSE 110* 100*  BUN 21 22  CREATININE 1.40* 1.40*  CALCIUM 8.7*  --     CBC: Recent Labs  Lab 08/13/22 0225 08/13/22 0503  WBC 8.5  --   NEUTROABS 7.0  --   HGB 14.8 13.9  HCT 43.6 41.0  MCV 88.6  --   PLT 174  --     Coagulation Studies: Recent Labs    08/13/22 0225  LABPROT 18.4*  INR 1.5*     Examination:  General examination is notable for the fact that there is fecal matter on the bed sheets, patient is unaware of having passed this fecal matter.  He is very sleepy, falling asleep during my evaluation if not stimulated. Rectal examination is notable for normal tone, intact sensation  On focused neurological examination, I obtained normal patellar reflexes (2+), and noted he had an extremely shuffling wide-based gait with attempts to ambulate.  When  turning, he did not lift his right leg at all and took many steps with the left leg to turn.  Shuffling improved slightly after initiation of gait, with the first few steps being the most limited  MRI C-spine personally reviewed, agree with radiology:   1. Motion degraded study. 2. Cervical spondylosis. Mild foraminal narrowing bilaterally at C4-5, C5-6, C6-7 due to spurring.  MRI brain personally reviewed, agree with radiology:   1. No acute intracranial abnormality. 2. Mild cerebral atrophy.  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  With this additional information, she is willing to stop Eliquis to pursue lumbar puncture (for encephalopathy/gait apraxia rather than GBS) and additional workup  RSV in children is associated with encephalitis but this is not reported in adults  Plan: -Eliquis discontinued, last dose was at 13:34 today 11/30 -There are data that LP can be safely completed 24 hours after last Eliquis dose in the setting of normal renal function,  may need to wait 48 hours given his slightly reduced renal function, appreciate IR assistance [email or web address] (Periprocedural antithrombotic management for lumbar puncture: Association of [employer] Neurologists clinical guideline  Practical Neurology ([email or web address])  -Repeat PT INR, PTT to help guide assessment of safety to proceed with LP -CSF studies to be ordered: Opening pressure, cell counts in tubes 1 and 4, protein, glucose, meningitis/encephalitis PCR panel, bacterial culture and smear, IgG index, oligoclonal bands, with additional CSF reserved in case preliminary studies or clinical course suggest further testing should be performed;  -Routine  EEG -0.5 mg x 1 dose as needed for MRI scans, reordered MRI brain with contrast, MRI cervical spine, MRI thoracic spine with and without still pending  -Noting patient has tolerated 0.25 mg dose at 10 AM as well as at 1500; MRI was attempted at 1400 without success  Lesleigh Noe MD-PhD [facility] [phone] Available 7 AM to 7 PM, outside these hours please contact Neurologist on call listed on AMION

## 2022-08-13 NOTE — ED Provider Notes (Signed)
[facility] Emergency Department Provider Note MRN:  [id number]  [place] date & time: 08/13/22     Chief Complaint   Weakness   History of Present Illness   Joseph Morrow is a 76 y.o. year-old male with a history of A-fib presenting to the ED with chief complaint of weakness.  Generalized weakness, abnormal gait, vivid dreams over the past 3 days.  Initially described as hallucinations but they only occur when he is sleeping.  He sees himself being chased by demons.  Has been having headaches recently.  Describes a recent cold or URI with cough, has been on steroids, cough medicine, antibiotics.  Concerned that maybe the medicines are causing his vivid dreams and symptoms.  Denies numbness or weakness to the arms or legs focally, but does endorse global weakness, trouble standing up, trouble walking which is very abnormal for him.  No chest pain or shortness of breath, no abdominal pain.  Review of Systems  A thorough review of systems was obtained and all systems are negative except as noted in the HPI and PMH.   Patient's Health History    Past Medical History:  Diagnosis Date   Acute cholecystitis 06/21/2011   Allergy    SEASONAL   Cataract    Complication of anesthesia    HALLUCINATIONS   ED (erectile dysfunction)    GERD (gastroesophageal reflux disease)    Hearing loss    Helicobacter pylori gastritis 06/2014   Hemorrhoids    Hiatal hernia    Hx of adenomatous colonic polyps 06/2014   Hypertrophic cardiomyopathy (HCC)    Mitral regurgitation    Moderate   Osteopenia    Paroxysmal atrial fibrillation ([place]) 04/2015   documented on ICD remote interrogation, treated with sotalol   Septal defect    LV Septal hyptertrophy 51m, normal EF, minimal LVOT gradiant.   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 Jude ICD    Past Surgical History:  Procedure Laterality Date   APPENDECTOMY  1950   ATRIAL  FIBRILLATION ABLATION N/A 07/04/2020   Procedure: ATRIAL FIBRILLATION ABLATION;  Surgeon: AThompson Grayer MD;  [facility];  Service: Cardiovascular;  Laterality: N/A;   ATRIAL FIBRILLATION ABLATION N/A 09/18/2021   Procedure: ATRIAL FIBRILLATION ABLATION;  Surgeon: LVickie Epley MD;  [facility];  Service: Cardiovascular;  Laterality: N/A;   CARDIOVERSION N/A 02/25/2017   Procedure: CARDIOVERSION;  Surgeon: RFay Records MD;  [facility];  Service: Cardiovascular;  Laterality: N/A;   CARDIOVERSION N/A 07/30/2021   Procedure: CARDIOVERSION;  Surgeon: BJanina Mayo MD;  [place] [facility]  Service: Cardiovascular;  Laterality: N/A;   CARDIOVERSION N/A 04/27/2022   Procedure: CARDIOVERSION;  Surgeon: SJerline Pain MD;  [place] [facility]  Service: Cardiovascular;  Laterality: N/A;   CATARACT EXTRACTION, BILATERAL     CHOLECYSTECTOMY  06/24/2011   COLONOSCOPY     EP IMPLANTABLE DEVICE N/A 09/03/2016   SJM Fortify [address] Dr generator change by Dr ARayann Heman  ICD implanted for secondary prevention of sudden death   IMPLANTABLE CARDIOVERTER DEFIBRILLATOR IMPLANT  08/18/2013   St. Jude Fortify [address]ura DR ICD implanted by Dr ARayann Hemanfor secondary treatment of VT   IMPLANTABLE CARDIOVERTER DEFIBRILLATOR IMPLANT N/A 08/18/2013   Procedure: IMPLANTABLE CARDIOVERTER DEFIBRILLATOR IMPLANT;  Surgeon: JCoralyn Mark MD;  [facility];  Service: Cardiovascular;  Laterality: N/A;   TEE WITHOUT CARDIOVERSION N/A 07/30/2021  Procedure: TRANSESOPHAGEAL ECHOCARDIOGRAM (TEE);  Surgeon: Janina Mayo, MD;  [facility];  Service: Cardiovascular;  Laterality: N/A;    Family History  Problem Relation Age of Onset   Hypertrophic cardiomyopathy Mother    Arrhythmia Mother    Heart failure Mother    Pneumonia Father    Diabetes Father    Colon polyps Father    Heart attack Brother    Sleep apnea Brother    Hypertrophic  cardiomyopathy Brother    Heart Problems Brother        VAD   Arrhythmia Brother    Heart Problems Brother    Diabetes Brother    Sudden death Brother    Lung disease Neg Hx     Social History   Socioeconomic History   Marital status: Married    Spouse name: Not on file   Number of children: 2   Years of education: Not on file   Highest education level: Not on file  Occupational History   Occupation: retired  Tobacco Use   Smoking status: Former    Packs/day: 2.00    Years: 20.00    Total pack years: 40.00    Types: Cigarettes    Quit date: 07/06/1981    Years since quitting: 41.1   Smokeless tobacco: Never   Tobacco comments:    Former smoker 10/21/21  Vaping Use   Vaping Use: Never used  Substance and Sexual Activity   Alcohol use: Not Currently    Comment: stop drinking 2 glasses of wine since ablation 10/21/21   Drug use: No   Sexual activity: Not on file  Other Topics Concern   Not on file  Social History Narrative   Originally from [place]. Moved to [place] in 2012. Currently has no pets. No bird or hot tub exposure. 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 He did have exposure to Beneze, Acetone, Styrene, & Hydrocarbon early in his career.   Social Determinants of Health   Financial Resource Strain: Not on file  Food Insecurity: Not on file  Transportation Needs: Not on file  Physical Activity: Not on file  Stress: Not on file  Social Connections: Not on file  Intimate Partner Violence: Not on file     Physical Exam   Vitals:   08/13/22 0600 08/13/22 0630  BP: 110/78 112/76  Pulse: 63 (!) 57  Resp: (!) 26 (!) 21  Temp:    SpO2: 96% 95%    CONSTITUTIONAL: Well-appearing, NAD NEURO/PSYCH:  Alert and oriented x 3, no focal deficits, slow shuffled gait with instability EYES:  eyes equal and reactive ENT/NECK:  no LAD, no JVD CARDIO: Regular rate, well-perfused, normal S1 and S2 PULM:  CTAB no wheezing or  rhonchi GI/GU:  non-distended, non-tender MSK/SPINE:  No gross deformities, no edema SKIN:  no rash, atraumatic   *Additional and/or pertinent findings included in MDM below  Diagnostic and Interventional Summary    EKG Interpretation  Date/Time:  Thursday August 13 2022 02:17:18 EST Ventricular Rate:  85 PR Interval:    QRS Duration: 66 QT Interval:  358 QTC Calculation: 426 R Axis:   110 Text Interpretation:  Suspect arm lead reversal, interpretation assumes no reversal Atrial flutter with variable A-V block with premature ventricular or aberrantly conducted complexes Right axis deviation ST & T wave abnormality, consider anterolateral ischemia Abnormal ECG When compared with ECG of 22-Jul-2022 08:42, PREVIOUS ECG IS PRESENT Confirmed by Gerlene Fee [phone]) on 08/13/2022 3:57:27  AM       Labs Reviewed  PROTIME-INR - Abnormal; Notable for the following components:      Result Value   Prothrombin Time 18.4 (*)    INR 1.5 (*)    All other components within normal limits  APTT - Abnormal; Notable for the following components:   aPTT 38 (*)    All other components within normal limits  COMPREHENSIVE METABOLIC PANEL - Abnormal; Notable for the following components:   Glucose, Bld 110 (*)    Creatinine, Ser 1.40 (*)    Calcium 8.7 (*)    Total Protein 6.1 (*)    Albumin 3.3 (*)    AST 43 (*)    ALT 62 (*)    GFR, Estimated 52 (*)    All other components within normal limits  I-STAT CHEM 8, ED - Abnormal; Notable for the following components:   Creatinine, Ser 1.40 (*)    Glucose, Bld 100 (*)    Calcium, Ion 1.12 (*)    All other components within normal limits  CBC  DIFFERENTIAL  ETHANOL  LACTIC ACID, PLASMA  LACTIC ACID, PLASMA  URINALYSIS, ROUTINE W REFLEX MICROSCOPIC  CBG MONITORING, ED    CT HEAD WO CONTRAST  Final Result    DG Chest Port 1 View  Final Result    DG Lumbar Puncture Fluoro Guide    (Results Pending)  MR CERVICAL SPINE W WO CONTRAST     (Results Pending)  MR THORACIC SPINE W WO CONTRAST    (Results Pending)    Medications  sodium chloride 0.9 % bolus 1,000 mL (has no administration in time range)  prochlorperazine (COMPAZINE) injection 10 mg (has no administration in time range)  diphenhydrAMINE (BENADRYL) injection 25 mg (has no administration in time range)  sodium chloride flush (NS) 0.9 % injection 3 mL (3 mLs Intravenous Given 08/13/22 0415)  cefTRIAXone (ROCEPHIN) 2 g in sodium chloride 0.9 % 100 mL IVPB (0 g Intravenous Stopped 08/13/22 0623)  azithromycin (ZITHROMAX) 500 mg in sodium chloride 0.9 % 250 mL IVPB (500 mg Intravenous New Bag/Given 08/13/22 0629)     Procedures  /  Critical Care .Critical Care  Performed by: Maudie Flakes, MD Authorized by: Maudie Flakes, MD   Critical care provider statement:    Critical care time (minutes):  35   Critical care was necessary to treat or prevent imminent or life-threatening deterioration of the following conditions:  CNS failure or compromise   Critical care was time spent personally by me on the following activities:  Development of treatment plan with patient or surrogate, discussions with consultants, evaluation of patient's response to treatment, examination of patient, ordering and review of laboratory studies, ordering and review of radiographic studies, ordering and performing treatments and interventions, pulse oximetry, re-evaluation of patient's condition and review of old charts .Lumbar Puncture  Date/Time: 08/13/2022 7:36 AM  Performed by: Maudie Flakes, MD Authorized by: Maudie Flakes, MD   Consent:    Consent obtained:  Written and verbal   Risks, benefits, and alternatives were discussed: yes     Risks discussed:  Bleeding, headache, nerve damage, infection, repeat procedure and pain Universal protocol:    Procedure explained and questions answered to patient or proxy's satisfaction: yes     Site/side marked: yes     Patient identity  confirmed:  Verbally with patient Pre-procedure details:    Procedure purpose:  Diagnostic   Preparation: Patient was prepped and draped in usual sterile  fashion   Anesthesia:    Anesthesia method:  Local infiltration   Local anesthetic:  Lidocaine 1% w/o epi Procedure details:    Lumbar space:  L4-L5 interspace   Patient position:  L lateral decubitus   Needle gauge:  22   Needle type:  Diamond point   Needle length (in):  2.5   Ultrasound guidance: no     Number of attempts:  2 Post-procedure details:    Procedure completion:  Tolerated well, no immediate complications Comments:     Dry tap   ED Course and Medical Decision Making  Initial Impression and Ddx Suspect prednisone is the cause of patient's new vivid dreams.  A bit odd that he is also having diffuse weakness, trouble standing and walking, positive Romberg on exam.  Has been in A-fib for the past month.  And so stroke is also considered.  Suspect will need MRI.  Guillain-Barr also considered.  Past medical/surgical history that increases complexity of ED encounter: None  Interpretation of Diagnostics I personally reviewed the Chest Xray and my interpretation is as follows: Question infiltrate versus atelectasis, no pneumothorax  Labs overall reassuring with no significant blood count or electrolyte disturbance  Patient Reassessment and Ultimate Disposition/Management     Awaiting MRI, will need fluoroscopy guided LP, accepted for admission by hospitalist service, neurology will follow in consultation.  Patient management required discussion with the following services or consulting groups:  Hospitalist Service and Neurology  Complexity of Problems Addressed Acute illness or injury that poses threat of life of bodily function  Additional Data Reviewed and Analyzed Further history obtained from: Further history from spouse/family member  Additional Factors Impacting ED Encounter Risk Consideration of  hospitalization  Barth Kirks. Sedonia Small, MD [place] [email or web address]  Final Clinical Impressions(s) / ED Diagnoses     ICD-10-CM   1. Abnormal dreams  F51.8     2. Gait disturbance  R26.9     3. Anxiety  F41.9     4. Nonintractable headache, unspecified chronicity pattern, unspecified headache type  R51.9     5. Weakness  R53.1       ED Discharge Orders     None        Discharge Instructions Discussed with and Provided to Patient:   Discharge Instructions   None      Maudie Flakes, MD 08/13/22 [phone]

## 2022-08-13 NOTE — ED Notes (Signed)
MRI called to have pt back on list to be scanned

## 2022-08-13 NOTE — H&P (Addendum)
History and Physical    Patient: Joseph Morrow VEL:[id number] DOB: 02/09/1946 DOA: 08/13/2022 DOS: the patient was seen and examined on 08/13/2022 PCP: Ginger Organ., MD  Patient coming from: Home  Chief Complaint:  Chief Complaint  Patient presents with   Weakness   HPI: Joseph Morrow is a 76 y.o. male with medical history significant of paroxysmal atrial fibrillation s/p prior ablation, CKD stage IIIa who presents with complaints of weakness.  Patient's wife was sick about 2 weeks ago with an upper respiratory infection.  Then 5 days ago patient developed symptoms of productive cough and congestion.  He saw his PCP 3 days ago and was prescribed doxycycline, prednisone, and Tessalon Perles.  Reports testing negative for influenza and COVID-19 at that time.  The first night after taking the medications he developed symptoms of vivid dreams/nightmares for which he became very anxious and has been unable to sleep.  Reported associated symptoms of nausea, headache, weakness with shuffling gait, and off balance leaning toward the right.  He denied having any significant fever or vomiting.  He took the medications for 2 days, but after telling his primary about his symptoms he was told to stop the doxycycline and prednisone.  He continued to take the [employer]  Patient makes note that he had been started on amiodarone in the end of last month.  In the emergency department patient was noted to be afebrile with pulse 63-75, respirations 17-26, and all other vital signs maintained.  Labs appear near patient's baseline.  Chest x-ray noted mild right basilar atelectasis and/or early infiltrate.  CT scan of the head noted no acute abnormality.  Patient had been given empiric antibiotics of Rocephin and azithromycin, Benadryl, and Compazine.  Review of Systems: As mentioned in the history of present illness. All other systems reviewed and are negative. Past Medical History:  Diagnosis  Date   Acute cholecystitis 06/21/2011   Allergy    SEASONAL   Cataract    Complication of anesthesia    HALLUCINATIONS   ED (erectile dysfunction)    GERD (gastroesophageal reflux disease)    Hearing loss    Helicobacter pylori gastritis 06/2014   Hemorrhoids    Hiatal hernia    Hx of adenomatous colonic polyps 06/2014   Hypertrophic cardiomyopathy (HCC)    Mitral regurgitation    Moderate   Osteopenia    Paroxysmal atrial fibrillation ([place]) 04/2015   documented on ICD remote interrogation, treated with sotalol   Septal defect    LV Septal hyptertrophy 35m, normal EF, minimal LVOT gradiant.   Tubular adenoma of colon    VT (ventricular tachycardia) ([place] 08/18/2013   s/p St. Jude ICD   Past Surgical History:  Procedure Laterality Date   APPENDECTOMY  1950   ATRIAL FIBRILLATION ABLATION N/A 07/04/2020   Procedure: ATRIAL FIBRILLATION ABLATION;  Surgeon: AThompson Grayer MD;  [facility];  Service: Cardiovascular;  Laterality: N/A;   ATRIAL FIBRILLATION ABLATION N/A 09/18/2021   Procedure: ATRIAL FIBRILLATION ABLATION;  Surgeon: LVickie Epley MD;  [facility];  Service: Cardiovascular;  Laterality: N/A;   CARDIOVERSION N/A 02/25/2017   Procedure: CARDIOVERSION;  Surgeon: RFay Records MD;  [facility];  Service: Cardiovascular;  Laterality: N/A;   CARDIOVERSION N/A 07/30/2021   Procedure: CARDIOVERSION;  Surgeon: BJanina Mayo MD;  [place] [facility]  Service: Cardiovascular;  Laterality: N/A;   CARDIOVERSION N/A 04/27/2022   Procedure: CARDIOVERSION;  Surgeon: SJerline Pain MD;  Location: [facility];  Service: Cardiovascular;  Laterality: N/A;   CATARACT EXTRACTION, BILATERAL     CHOLECYSTECTOMY  06/24/2011   COLONOSCOPY     EP IMPLANTABLE DEVICE N/A 09/03/2016   [employer] Fortify [address] generator change by Dr Rayann Heman.  ICD implanted for secondary prevention of sudden death   IMPLANTABLE CARDIOVERTER DEFIBRILLATOR  IMPLANT  08/18/2013   [employer]. Jude Fortify [address] DR ICD implanted by Dr Rayann Heman for secondary treatment of VT   IMPLANTABLE CARDIOVERTER DEFIBRILLATOR IMPLANT N/A 08/18/2013   Procedure: IMPLANTABLE CARDIOVERTER DEFIBRILLATOR IMPLANT;  Surgeon: Coralyn Mark, MD;  Location: [facility] CATH LAB;  Service: Cardiovascular;  Laterality: N/A;   TEE WITHOUT CARDIOVERSION N/A 07/30/2021   Procedure: TRANSESOPHAGEAL ECHOCARDIOGRAM (TEE);  Surgeon: Janina Mayo, MD;  Location[facility] ENDOSCOPY;  Service: Cardiovascular;  Laterality: N/A;   Social History:  reports that he quit smoking about 41 years ago. His smoking use included cigarettes. He has a 40.00 pack-year smoking history. He has never used smokeless tobacco. He reports that he does not currently use alcohol. He reports that he does not use drugs.  Allergies  Allergen Reactions   Dextromethorphan Other (See Comments)    nervous   Meclizine     Caused increased heart rate and constipation   Versed [Midazolam] Other (See Comments)    "hallucinations, headache"   Zofran [Ondansetron]     Caused increased heart rate and constipation   Antihistamines, Diphenhydramine-Type Anxiety    Family History  Problem Relation Age of Onset   Hypertrophic cardiomyopathy Mother    Arrhythmia Mother    Heart failure Mother    Pneumonia Father    Diabetes Father    Colon polyps Father    Heart attack Brother    Sleep apnea Brother    Hypertrophic cardiomyopathy Brother    Heart Problems Brother        VAD   Arrhythmia Brother    Heart Problems Brother    Diabetes Brother    Sudden death Brother    Lung disease Neg Hx     Prior to Admission medications   Medication Sig Start Date End Date Taking? Authorizing Provider  acetaminophen (TYLENOL) 500 MG tablet Take 500-1,000 mg by mouth every 6 (six) hours as needed (pain.).    [provider]  amiodarone (PACERONE) 200 MG tablet Take 1 tablet (200 mg total) by mouth 2 (two) times daily for 30 days,  THEN 1 tablet (200 mg total) daily. 07/08/22 10/06/22  Fenton, Clint R, PA  apixaban (ELIQUIS) 5 MG TABS tablet TAKE 1 TABLET BY MOUTH TWICE A DAY 05/19/22   Vickie Epley, MD  calcium carbonate (OSCAL) 1500 (600 Ca) MG TABS tablet Take 600 mg by mouth in the morning and at bedtime.    [provider]  calcium carbonate (TUMS EX) 750 MG chewable tablet Chew 1 tablet by mouth 3 (three) times daily as needed for heartburn.    [provider]  Cholecalciferol (VITAMIN D3) 50 MCG (2000 UT) TABS Take 2,000 Units by mouth in the morning and at bedtime.    [provider]  Coenzyme Q10 (CO Q 10) 100 MG CAPS Take 100 mg by mouth in the morning.    [provider]  diltiazem (CARDIZEM) 30 MG tablet Cardizem '30mg'$  -- take 1 tablet every 4 hours AS NEEDED for heart rate >100 07/24/21   Sherran Needs, NP  furosemide (LASIX) 20 MG tablet Take 1 tablet by mouth daily until 11/16 then only  as needed for swelling/weight gain 09/10/21   Vickie Epley, MD  Glucosamine-Chondroitin (COSAMIN DS PO) Take 1 tablet by mouth in the morning.    [provider]  metoprolol succinate (TOPROL-XL) 50 MG 24 hr tablet TAKE 1 TABLET TWICE A DAY WITH OR IMMEDIATELY FOLLOWING A MEAL 01/28/22   Vickie Epley, MD  omeprazole (PRILOSEC OTC) 20 MG tablet Take 10 mg by mouth in the morning.    [provider]  potassium citrate (UROCIT-K) 10 MEQ (1080 MG) SR tablet Take 1 tablet (10 mEq total) by mouth daily as needed (with lasix (swelling/weight gain)). 09/10/21   Vickie Epley, MD  Probiotic Product (ALIGN PO) Take 1 capsule by mouth in the morning.    [provider]  Simethicone (GAS-X ULTRA STRENGTH) 180 MG CAPS Take 180 mg by mouth 2 (two) times daily.    [provider]  Zinc 50 MG CAPS Take 50 mg by mouth 2 (two) times daily.     [provider]    Physical Exam: Vitals:   08/13/22 0700 08/13/22 0715 08/13/22 0800 08/13/22 0803   BP: 104/72 111/72 110/77   Pulse: 63 64 75   Resp: (!) '21 15 17   '$ Temp:    98.1 F (36.7 C)  TempSrc:    Oral  SpO2: 97% 97% 96%   Weight:      Height:       Exam  Constitutional: Elderly male who appears in some distress  eyes: PERRL, lids and conjunctivae normal ENMT: Mucous membranes are moist.   Neck: normal, supple, no masses, no thyromegaly Respiratory: Mildly tachypneic with rales noted of the right lower lung field.  O2 saturations currently maintained on room air. Cardiovascular: Irregular irregular and tachycardic.  1+ edema on the right lower extremity and 2+ edema on the left lower extremity Abdomen: no tenderness, no masses palpated. No hepatosplenomegaly. Bowel sounds positive.  Musculoskeletal: no clubbing / cyanosis. No joint deformity upper and lower extremities. Good ROM, no contractures. Normal muscle tone.  Skin: no rashes, lesions, ulcers. No induration Neurologic: CN 2-12 grossly intact. Sensation intact, DTR normal. Strength 5/5 in all 4.  Psychiatric: Normal judgment and insight. Alert and oriented x 3. Normal mood.   Data Reviewed:    Assessment and Plan:  RSV  possible community-acquired pneumonia Patient presented with complaints of cough and generalized malaise starting approximately 5 days ago after wife had been sick prior with similar symptoms.  On physical exam patient with rales of the right lower lung field.  Chest x-ray gave concern for possible right lower lobe pneumonia.  Tested negative for COVID and influenza.  He had been treated with prednisone and doxycycline in the outpatient setting for 2 days but this was discontinued yesterday due to her symptoms as noted below.  Patient had been started on empiric antibiotics of Rocephin and azithromycin.   -Admit to a telemetry bed -Incentive spirometry and flutter valve -Check complete respiratory virus panel (positive for RSV for which is suspect symptoms more so likely to a viral  pneumonia) -Check procalcitonin -Continue Rocephin and azithromycin if noted to have elevated procalcitonin level, but otherwise consider discontinuation -Mucinex  Acute metabolic encephalopathy Patient reported having vivid dreams/nightmares for which he has been more anxious and unable to sleep.  CT scan of the head did not note any acute abnormality question if symptoms patient has been asked during seeing our possibly steroid-induced and/or infection.  Patient had been given Ativan 0.25 mg IV  x 1 dose due to increased anxiety.  However patient became significantly lethargic for which it was discontinued -Neurochecks -Follow-up MRI of the brain.  Lower extremity weakness gait disturbance Patient reports having weakness in his legs with shuffling gait and issues with balance where he was reportedly leaning to the right.  TSH was within normal limits on 07/08/2022 at 0.904.  Plans had initially been made for patient have a lumbar puncture, but patient's wife would rather him stay on Eliquis at this time.  Neurology felt LP would be of low yield as strength is actually quite normal. 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  Prior history of ablation in 06/2020 and redo in 09/2021.  He had DCCV in 04/2022, but was noted to be in atrial fibrillation thereafter.  Patient  had he had been started on amiodarone recently by his cardiologist and plan was for possible repeat DCCV if does not convert. -Continue Eliquis -Continue amiodarone, metoprolol, and as needed diltiazem -Recommend patient follow-up with cardiologist in the outpatient setting  Essential hypertension -Continue home blood pressure  regimen  History of VT s/p AICD   Chronic kidney disease stage IIIa Creatinine 1.4 which appears around patient's baseline. -Continue to monitor kidney function   DVT prophylaxis: Eliquis Advance Care Planning:   Code Status: Full Code    Consults: Neurology  Family Communication: Wife updated at bedside  Severity of Illness: The appropriate patient status for this patient is INPATIENT. Inpatient status is judged to be reasonable and necessary in order to provide the required intensity of service to ensure the patient's safety. The patient's presenting symptoms, physical exam findings, and initial radiographic and laboratory data in the context of their chronic comorbidities is felt to place them at high risk for further clinical deterioration. Furthermore, it is not anticipated that the patient will be medically stable for discharge from the hospital within 2 midnights of admission.   * I certify that at the point of admission it is my clinical judgment that the patient will require inpatient hospital care spanning beyond 2 midnights from the point of admission due to high intensity of service, high risk for further deterioration and high frequency of surveillance required.*  Author: Norval Morton, MD 08/13/2022 8:52 AM  For on call review [email or web address].

## 2022-08-13 NOTE — Consult Note (Signed)
NEURO HOSPITALIST CONSULT NOTE   Requestig physician: Dr. Sedonia Small  Reason for Consult: Diffuse limb weakness  History obtained from:  Patient, Wife and Chart     HPI:                                                                                                                                          Joseph Morrow is an 76 y.o. male with a PMHx of cataracts, ED, hearing loss, hypertrophic cardiomyopathy, mitral regurgitation, PAF (on Eliquis), tubular adenoma of colon and ventricular tachycardia s/p St. Jude ICD who presents to the ED with diffuse limb weakness and imbalance. Symptoms started about 3 days ago, 2 days after onset of symptoms of a URI with cough thought to have been contracted from his wife who had symptoms earlier, and approximately 10 days after flu vaccination. He feels as though he cannot control his legs when walking. Has also had vivid dreams over the past 3 days during which he sees himself being chased by demons; these were initially described as hallucinations to the Triage Nurse, but they only occur when he is sleeping. Has been having headaches recently.  He denies any changes to speech. Also with no lateralized weakness, no numbness, no facial droop and no aphasia.    Past Medical History:  Diagnosis Date   Acute cholecystitis 06/21/2011   Allergy    SEASONAL   Cataract    Complication of anesthesia    HALLUCINATIONS   ED (erectile dysfunction)    GERD (gastroesophageal reflux disease)    Hearing loss    Helicobacter pylori gastritis 06/2014   Hemorrhoids    Hiatal hernia    Hx of adenomatous colonic polyps 06/2014   Hypertrophic cardiomyopathy (HCC)    Mitral regurgitation    Moderate   Osteopenia    Paroxysmal atrial fibrillation ([place]) 04/2015   documented on ICD remote interrogation, treated with sotalol   Septal defect    LV Septal hyptertrophy 20m, normal EF, minimal LVOT gradiant.   Tubular adenoma of colon    VT  [place]ricular [place]cardia) [place] 08/18/2013   s/p St. Jude ICD    Past Surgical History:  Procedure Laterality Date   APPENDECTOMY  1950   ATRIAL FIBRILLATION ABLATION N/A 07/04/2020   Procedure: ATRIAL FIBRILLATION ABLATION;  Surgeon: AThompson Grayer MD;  [facility];  Service: Cardiovascular;  Laterality: N/A;   ATRIAL FIBRILLATION ABLATION N/A 09/18/2021   Procedure: ATRIAL FIBRILLATION ABLATION;  Surgeon: LVickie Epley MD;  [facility];  Service: Cardiovascular;  Laterality: N/A;   CARDIOVERSION N/A 02/25/2017   Procedure: CARDIOVERSION;  Surgeon: RFay Records MD;  [facility];  Service: Cardiovascular;  Laterality: N/A;   CARDIOVERSION N/A 07/30/2021   Procedure: CARDIOVERSION;  Surgeon:  Janina Mayo, MD;  Location: [place];  Service: Cardiovascular;  Laterality: N/A;   CARDIOVERSION N/A 04/27/2022   Procedure: CARDIOVERSION;  Surgeon: Jerline Pain, MD;  Location: [place];  Service: Cardiovascular;  Laterality: N/A;   CATARACT EXTRACTION, BILATERAL     CHOLECYSTECTOMY  06/24/2011   COLONOSCOPY     EP IMPLANTABLE DEVICE N/A 09/03/2016   SJM Fortify [address] Dr generator change by Dr Rayann Heman.  ICD implanted for secondary prevention of sudden death   IMPLANTABLE CARDIOVERTER DEFIBRILLATOR IMPLANT  08/18/2013   St. Jude Fortify Assura DR ICD implanted by Dr Rayann Heman for secondary treatment of VT   IMPLANTABLE CARDIOVERTER DEFIBRILLATOR IMPLANT N/A 08/18/2013   Procedure: IMPLANTABLE CARDIOVERTER DEFIBRILLATOR IMPLANT;  Surgeon: Coralyn Mark, MD;  [facility];  Service: Cardiovascular;  Laterality: N/A;   TEE WITHOUT CARDIOVERSION N/A 07/30/2021   Procedure: TRANSESOPHAGEAL ECHOCARDIOGRAM (TEE);  Surgeon: Janina Mayo, MD;  Location[facility];  Service: Cardiovascular;  Laterality: N/A;    Family History  Problem Relation Age of Onset   Hypertrophic cardiomyopathy Mother    Arrhythmia Mother    Heart  failure Mother    Pneumonia Father    Diabetes Father    Colon polyps Father    Heart attack Brother    Sleep apnea Brother    Hypertrophic cardiomyopathy Brother    Heart Problems Brother        VAD   Arrhythmia Brother    Heart Problems Brother    Diabetes Brother    Sudden death Brother    Lung disease Neg Hx               Social History:  reports that he quit smoking about 41 years ago. His smoking use included cigarettes. He has a 40.00 pack-year smoking history. He has never used smokeless tobacco. He reports that he does not currently use alcohol. He reports that he does not use drugs.  Allergies  Allergen Reactions   Dextromethorphan Other (See Comments)    nervous   Meclizine     Caused increased heart rate and constipation   Versed [Midazolam] Other (See Comments)    "hallucinations, headache"   Zofran [Ondansetron]     Caused increased heart rate and constipation   Antihistamines, Diphenhydramine-Type Anxiety    MEDICATIONS:                                                                                                                      No current facility-administered medications on file prior to encounter.   Current Outpatient Medications on File Prior to Encounter  Medication Sig Dispense Refill   acetaminophen (TYLENOL) 500 MG tablet Take 500-1,000 mg by mouth every 6 (six) hours as needed (pain.).     amiodarone (PACERONE) 200 MG tablet Take 1 tablet (200 mg total) by mouth 2 (two) times daily for 30 days, THEN 1 tablet (200 mg total) daily. 120 tablet 0   apixaban (ELIQUIS) 5 MG TABS tablet TAKE  1 TABLET BY MOUTH TWICE A DAY 60 tablet 5   benzonatate (TESSALON) 200 MG capsule Take 200 mg by mouth 3 (three) times daily.     calcium carbonate (OSCAL) 1500 (600 Ca) MG TABS tablet Take 600 mg by mouth in the morning and at bedtime.     Cholecalciferol (VITAMIN D3) 50 MCG (2000 UT) TABS Take 2,000 Units by mouth in the morning and at bedtime.     Coenzyme Q10  (CO Q 10) 100 MG CAPS Take 100 mg by mouth in the morning.     diltiazem (CARDIZEM) 30 MG tablet Cardizem '30mg'$  -- take 1 tablet every 4 hours AS NEEDED for heart rate >100 30 tablet 3   doxycycline (VIBRA-TABS) 100 MG tablet Take 100 mg by mouth 2 (two) times daily.     furosemide (LASIX) 20 MG tablet Take 1 tablet by mouth daily until 11/16 then only as needed for swelling/weight gain 30 tablet 11   Glucosamine-Chondroitin (COSAMIN DS PO) Take 1 tablet by mouth in the morning.     metoprolol succinate (TOPROL-XL) 50 MG 24 hr tablet TAKE 1 TABLET TWICE A DAY WITH OR IMMEDIATELY FOLLOWING A MEAL (Patient taking differently: Take 50 mg by mouth 2 (two) times daily.) 180 tablet 3   omeprazole (PRILOSEC OTC) 20 MG tablet Take 10 mg by mouth in the morning.     potassium citrate (UROCIT-K) 10 MEQ (1080 MG) SR tablet Take 1 tablet (10 mEq total) by mouth daily as needed (with lasix (swelling/weight gain)). 30 tablet 11   predniSONE (DELTASONE) 20 MG tablet Take 20 mg by mouth daily with breakfast.     Probiotic Product (ALIGN PO) Take 1 capsule by mouth in the morning.     Zinc 50 MG CAPS Take 50 mg by mouth 2 (two) times daily.        ROS:                                                                                                                                       As per HPI. Does not endorse any additional symptoms.    Blood pressure 114/73, pulse 63, temperature 97.9 F (36.6 C), temperature source Temporal, resp. rate 19, height '5\' 10"'$  (1.778 m), weight 83.4 kg, SpO2 94 %.   General Examination:                                                                                                       Physical  Exam  HEENT-  [place]/AT   Lungs- Respirations unlabored Extremities- Warm and well-perfused.   Neurological Examination Mental Status: Awake and alert. Fully oriented. Speech fluent without evidence of aphasia.  Able to follow all commands without difficulty. Cranial Nerves: II:  Temporal visual fields intact with no extinction to DSS. PERRL  III,IV, VI: No ptosis. EOMI.  V: Temp sensation equal bilaterally  VII: Smile symmetric VIII: Hearing intact to voice IX,X: No hoarseness XI: Symmetric shoulder shrug XII: Midline tongue extension Motor: BUE 4+/5 proximally and distally BLE 4+/5 proximally, 5/5 ADF and APF No pronator drift.  Tone is normal x 4 Sensory: Temp and light touch intact throughout, bilaterally. No extinction to DSS. Proprioception to toes intact bilaterally Deep Tendon Reflexes: 2+ and symmetric bilateral brachioradialis, biceps and triceps reflexes. Trace bilateral patellar reflexes (palpable only and required several attempts). 0 bilateral achilles.  Cerebellar: No ataxia with FNF bilaterally  Gait: Deferred   Lab Results: Basic Metabolic Panel: Recent Labs  Lab 08/13/22 0225 08/13/22 0503  NA 138 140  K 3.8 3.9  CL 105 103  CO2 24  --   GLUCOSE 110* 100*  BUN 21 22  CREATININE 1.40* 1.40*  CALCIUM 8.7*  --     CBC: Recent Labs  Lab 08/13/22 0225 08/13/22 0503  WBC 8.5  --   NEUTROABS 7.0  --   HGB 14.8 13.9  HCT 43.6 41.0  MCV 88.6  --   PLT 174  --     Cardiac Enzymes: No results for input(s): "CKTOTAL", "CKMB", "CKMBINDEX", "TROPONINI" in the last 168 hours.  Lipid Panel: No results for input(s): "CHOL", "TRIG", "HDL", "CHOLHDL", "VLDL", "LDLCALC" in the last 168 hours.  Imaging: CT HEAD WO CONTRAST  Result Date: 08/13/2022 CLINICAL DATA:  Altered mental status. EXAM: CT HEAD WITHOUT CONTRAST TECHNIQUE: Contiguous axial images were obtained from the base of the skull through the vertex without intravenous contrast. RADIATION DOSE REDUCTION: This exam was performed according to the departmental dose-optimization program which includes automated exposure control, adjustment of the mA and/or kV according to patient size and/or use of iterative reconstruction technique. COMPARISON:  None Available. FINDINGS: Brain:  There is mild cerebral atrophy with widening of the extra-axial spaces and ventricular dilatation. There are areas of decreased attenuation within the white matter tracts of the supratentorial brain, consistent with microvascular disease changes. Vascular: No hyperdense vessel or unexpected calcification. Skull: Normal. Negative for fracture or focal lesion. Sinuses/Orbits: No acute finding. Other: None. IMPRESSION: 1. No acute intracranial abnormality. 2. Generalized cerebral atrophy and microvascular disease changes of the supratentorial brain. Electronically Signed   By: Virgina Norfolk M.D.   On: 08/13/2022 04:53   DG Chest Port 1 View  Result Date: 08/13/2022 CLINICAL DATA:  Cough. EXAM: PORTABLE CHEST 1 VIEW COMPARISON:  October 10, 2021 FINDINGS: There is a dual lead AICD. The heart size and mediastinal contours are within normal limits. Mild atelectasis and/or early infiltrate is seen within the right lung base. There is no evidence of a pleural effusion or pneumothorax. The visualized skeletal structures are unremarkable. IMPRESSION: Mild right basilar atelectasis and/or early infiltrate. Electronically Signed   By: Virgina Norfolk M.D.   On: 08/13/2022 04:21     Assessment: 76 year old male presenting with diffuse limb weakness, bad headaches and visual hallucinations after what he states has been one week of bad cold symptoms - Exam reveals subtly decreased strength of proximal BLE and BUE as well as depressed patellar reflexes and absent achilles  reflexes, the latter being low sensitivity/specificity in his age group. No ataxia noted. Proprioception, FT and temp sensation intact x 4.  - CT head with no acute intracranial abnormality. Generalized cerebral atrophy and microvascular disease changes of the supratentorial brain are noted.  - Overall presentation does not militate strongly in favor of GBS. This test is likely to be low-yield given normal upper extremity reflexes despite trace  patellars as well as minimally decreased strength on exam. His unsteady gait would more likely be due to a posterior column lesion. He does take zinc supplementation which has been linked to posterior column lesions due to zinc-induced hypocupremia, but states that the doses he takes are per guidelines and he does not take extra pills.  - Fluoro guided LP has been cancelled as he is on Eliquis. Discussed risks/benefits of holding Eliquis for LP in the setting of his atrial fibrillation. Wife would prefer not to have LP as Eliquis would need to be held, with associated slightly increased risk of stroke if holding this medication given his atrial fibrillation.    Recommendations: - MRI brain, cervical and thoracic spine with and without contrast.  - B12, copper, vitamin E and zinc levels (ordered) - STAT thiamine level followed by high-dose IV thiamine given possible ataxia given his complaint of gait unsteadiness (although undetectable on exam) - Continue to closely monitor   Electronically signed: Dr. Kerney Elbe 08/13/2022, 5:54 AM

## 2022-08-13 NOTE — ED Triage Notes (Signed)
The pt has had a bad cold for one week   3 days ago the pt had af  he took new meds   also 3 days ago he has had headaches  his gait has changed and he is seeing things that are not real

## 2022-08-14 ENCOUNTER — Encounter (HOSPITAL_COMMUNITY): Payer: Self-pay

## 2022-08-14 ENCOUNTER — Ambulatory Visit (HOSPITAL_COMMUNITY): Payer: Medicare Other | Admitting: Nurse Practitioner

## 2022-08-14 ENCOUNTER — Encounter (HOSPITAL_COMMUNITY): Payer: Self-pay | Admitting: Family Medicine

## 2022-08-14 ENCOUNTER — Inpatient Hospital Stay (HOSPITAL_COMMUNITY): Payer: Medicare Other

## 2022-08-14 ENCOUNTER — Other Ambulatory Visit (HOSPITAL_COMMUNITY): Payer: Medicare Other

## 2022-08-14 DIAGNOSIS — R269 Unspecified abnormalities of gait and mobility: Secondary | ICD-10-CM | POA: Diagnosis not present

## 2022-08-14 DIAGNOSIS — J121 Respiratory syncytial virus pneumonia: Secondary | ICD-10-CM | POA: Diagnosis not present

## 2022-08-14 DIAGNOSIS — R482 Apraxia: Secondary | ICD-10-CM

## 2022-08-14 DIAGNOSIS — F419 Anxiety disorder, unspecified: Secondary | ICD-10-CM

## 2022-08-14 DIAGNOSIS — R29898 Other symptoms and signs involving the musculoskeletal system: Secondary | ICD-10-CM

## 2022-08-14 DIAGNOSIS — Z7901 Long term (current) use of anticoagulants: Secondary | ICD-10-CM

## 2022-08-14 DIAGNOSIS — G9340 Encephalopathy, unspecified: Secondary | ICD-10-CM | POA: Diagnosis not present

## 2022-08-14 DIAGNOSIS — R4182 Altered mental status, unspecified: Secondary | ICD-10-CM | POA: Diagnosis not present

## 2022-08-14 DIAGNOSIS — R270 Ataxia, unspecified: Secondary | ICD-10-CM

## 2022-08-14 DIAGNOSIS — R519 Headache, unspecified: Secondary | ICD-10-CM

## 2022-08-14 DIAGNOSIS — F518 Other sleep disorders not due to a substance or known physiological condition: Secondary | ICD-10-CM

## 2022-08-14 DIAGNOSIS — R531 Weakness: Secondary | ICD-10-CM

## 2022-08-14 DIAGNOSIS — R7401 Elevation of levels of liver transaminase levels: Secondary | ICD-10-CM | POA: Insufficient documentation

## 2022-08-14 DIAGNOSIS — R260 Ataxic gait: Secondary | ICD-10-CM | POA: Diagnosis not present

## 2022-08-14 LAB — MENINGITIS/ENCEPHALITIS PANEL (CSF)

## 2022-08-14 LAB — BASIC METABOLIC PANEL
Anion gap: 10 (ref 5–15)
BUN: 19 mg/dL (ref 8–23)
CO2: 26 mmol/L (ref 22–32)
Calcium: 8.5 mg/dL — ABNORMAL LOW (ref 8.9–10.3)
Chloride: 102 mmol/L (ref 98–111)
Creatinine, Ser: 1.32 mg/dL — ABNORMAL HIGH (ref 0.61–1.24)
GFR, Estimated: 56 mL/min — ABNORMAL LOW (ref >60)
Glucose, Bld: 100 mg/dL — ABNORMAL HIGH (ref 70–99)
Potassium: 3.7 mmol/L (ref 3.5–5.1)
Sodium: 138 mmol/L (ref 135–145)

## 2022-08-14 LAB — APTT: aPTT: 39 seconds — ABNORMAL HIGH (ref 24–36)

## 2022-08-14 LAB — CSF CELL COUNT WITH DIFFERENTIAL
RBC Count, CSF: 0 /mm3
RBC Count, CSF: 0 /mm3
Tube #: 1
Tube #: 4
WBC, CSF: 1 /mm3 (ref 0–5)
WBC, CSF: 2 /mm3 (ref 0–5)

## 2022-08-14 LAB — PROTIME-INR
INR: 1.5 — ABNORMAL HIGH (ref 0.8–1.2)
Prothrombin Time: 18 seconds — ABNORMAL HIGH (ref 11.4–15.2)

## 2022-08-14 LAB — CBC
HCT: 39.9 % (ref 39.0–52.0)
Hemoglobin: 13.5 g/dL (ref 13.0–17.0)
MCH: 29.8 pg (ref 26.0–34.0)
MCHC: 33.8 g/dL (ref 30.0–36.0)
MCV: 88.1 fL (ref 80.0–100.0)
Platelets: 146 10*3/uL — ABNORMAL LOW (ref 150–400)
RBC: 4.53 MIL/uL (ref 4.22–5.81)
RDW: 13.5 % (ref 11.5–15.5)
WBC: 7.4 10*3/uL (ref 4.0–10.5)
nRBC: 0 % (ref 0.0–0.2)

## 2022-08-14 LAB — PROTEIN AND GLUCOSE, CSF
Glucose, CSF: 66 mg/dL (ref 40–70)
Total  Protein, CSF: 58 mg/dL — ABNORMAL HIGH (ref 15–45)

## 2022-08-14 MED ORDER — SODIUM CHLORIDE 0.9 % IV BOLUS
500.0000 mL | Freq: Once | INTRAVENOUS | Status: AC
  Administered 2022-08-14: 500 mL via INTRAVENOUS

## 2022-08-14 MED ORDER — APIXABAN 5 MG PO TABS
5.0000 mg | ORAL_TABLET | Freq: Two times a day (BID) | ORAL | Status: DC
  Administered 2022-08-14 – 2022-08-15 (×2): 5 mg via ORAL
  Filled 2022-08-14 (×2): qty 1

## 2022-08-14 MED ORDER — LIDOCAINE HCL (PF) 1 % IJ SOLN
5.0000 mL | Freq: Once | INTRAMUSCULAR | Status: AC
  Administered 2022-08-14: 5 mL via INTRADERMAL

## 2022-08-14 NOTE — Assessment & Plan Note (Addendum)
Encephalopathy ruled out.  Neurology are directing work up.  Cell counts pristine, glucose normal, protein slightly elevated, meningitis PCR panel negative - Follow up CSF culture, oligoclonal bands, IgG index, VDRL, ENC2 autoimmune encephalopathy panel - Follow up MRI brain and spinal cord with and without contrast

## 2022-08-14 NOTE — Assessment & Plan Note (Signed)
Mild - Trend LFTs

## 2022-08-14 NOTE — Assessment & Plan Note (Addendum)
Infiltrate on CXR but not requiring O2.  Procal negative.  - Stop antibiotics - Supportive cares

## 2022-08-14 NOTE — Assessment & Plan Note (Signed)
Due to atrial flutter - Hold Eliquis as above

## 2022-08-14 NOTE — Progress Notes (Addendum)
OT Cancellation Note  Patient Details Name: Joseph Morrow MRN: [id number] DOB: Oct 23, 1945   Cancelled Treatment:    Reason Eval/Treat Not Completed: Medical issues which prohibited therapy Pt just returned from getting LP. Will follow up as schedule permits and after bedrest parameters.   Addendum: Per RN, pt on bedrest for 12 hours after LP. Will follow up tomorrow for OT eval.   Layla Maw 08/14/2022, 11:12 AM

## 2022-08-14 NOTE — ED Notes (Signed)
Md made aware of cardiac irregularities.

## 2022-08-14 NOTE — Hospital Course (Addendum)
Joseph Morrow is a 76 y.o. 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  In the ER, he also described symptoms of ataxia, generalized weakness, and nightmares, and so Neurology were consulted.   11/30: Admitted, Neuro consulted, RSV+ 12/1: LP done, could not obtain MRI, clinically better

## 2022-08-14 NOTE — Progress Notes (Signed)
  Progress Note   Patient: Joseph Morrow FHL:[id number] DOB: [birth year] DOA: 08/13/2022     1 DOS: the patient was seen and examined on 08/14/2022       Brief hospital course: Joseph Morrow is a 76 y.o. 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  In the ER, he also described symptoms of ataxia, generalized weakness, and nightmares, and so Neurology were consulted.   11/30: Admitted, Neuro consulted, RSV+ 12/1: LP done, could not obtain MRI, clinically better     Assessment and Plan: * RSV (respiratory syncytial virus pneumonia) Infiltrate on CXR but not requiring O2.  Procal negative.  - Stop antibiotics - Supportive cares    Gait disturbance Encephalopathy ruled out.  Neurology are directing work up.  Cell counts pristine, glucose normal, protein slightly elevated, meningitis PCR panel negative - Follow up CSF culture, oligoclonal bands, IgG index, VDRL, ENC2 autoimmune encephalopathy panel - Follow up MRI brain and spinal cord with and without contrast  Typical atrial flutter (HCC) - Continue amiodarone, diltiazem, metoprolol - Hold Eliquis until cleared by interventional radiology  Transaminitis Mild - Trend LFTs  Secondary hypercoagulable state (Joseph Morrow) Due to atrial flutter - Hold Eliquis as above  ICD (implantable cardioverter-defibrillator) in place    Hypertrophic cardiomyopathy (Joseph Morrow)            Subjective: Patient is feeling better, he still has a productive cough, but no dyspnea, chest pain, respiratory distress.  His confusion is resolved.  He is not able to walk, but he feels like his muscle strength is improving back to normal.     Physical Exam: BP 97/61   Pulse 67   Temp 98.5 F (36.9 C) (Oral)   Resp 14   Ht '5\' 10"'$  (1.778 m)   Wt 83.4 kg   SpO2 99%   BMI 26.38 kg/m   Adult male, lying flat in bed, interactive and  appropriate RRR, no murmurs, no peripheral edema Respiratory normal, lungs clear without rales or wheezes Abdomen soft no tenderness palpation or guarding, no ascites or distention Attention normal, affect appropriate, oriented to person, place, time, and situation, gait not examined due to bedrest precautions, appears to be strength appears normal    Data Reviewed: Basic metabolic panel unremarkable, mildly elevated creatinine CBC unremarkable, procalcitonin negative, CSF studies as outlined above  Family Communication: Wife at the bedside    Disposition: Status is: Inpatient Patient was admitted for ataxia  Neurology work up is pending, he has not been able to ambulate yet due to current work up  He has no encephalopathy        Author: Edwin Dada, MD 08/14/2022 4:21 PM  For on call review [email or web address].

## 2022-08-14 NOTE — Progress Notes (Signed)
EEG complete - results pending 

## 2022-08-14 NOTE — Assessment & Plan Note (Signed)
-   Continue amiodarone, diltiazem, metoprolol - Hold Eliquis until cleared by interventional radiology

## 2022-08-14 NOTE — Progress Notes (Addendum)
Addendum 12/1 @ 1637  Initial CSF results: tube 1: RBC 0 WBC 1 tube 4: RBC 0 WBC 2 glucose 66 protein v mildly elevated at 58 meningitis/encephalitis PCR panel pan-neg  Will continue to follow  Su Monks, MD [facility] [phone]  If 7pm- 7am, please page neurology on call as listed in [place].   Neurology progress note  Subjective:  Patient underwent IR LP this afternoon, results pending. OP was 26. He is on bedrest after therefore I was not able to examine his gait he was not able to work with PT this afternoon.  He has no objective weakness on my examination today and has 1+ reflexes in the bilateral patella and 2+ at the bilateral Achilles. He is not encephalopathic for me today.  Data  EEG 12/1 WNL  Patient was unable to tolerate full MRI sequences due to anxiety, patient and wife note he has never had this kind of anxiety before and has tolerated MRIs in the past without any need for sedation.  MRI brain 1. No acute intracranial abnormality. 2. Mild cerebral atrophy.  MRI c spine wo 1. Motion degraded study. 2. Cervical spondylosis. Mild foraminal narrowing bilaterally at C4-5, C5-6, C6-7 due to spurring.  CNS imaging personally reviewed I agree with above interpretations.  Objective  Current vital signs: BP (!) 111/58   Pulse 63   Temp 98.1 F (36.7 C) (Oral)   Resp 18   Ht '5\' 10"'$  (1.778 m)   Wt 83.4 kg   SpO2 96%   BMI 26.38 kg/m  Vital signs in last 24 hours: Temp:  [98 F (36.7 C)-98.9 F (37.2 C)] 98.1 F (36.7 C) (12/01 1122) Pulse Rate:  [51-102] 63 (12/01 1200) Resp:  [10-29] 18 (12/01 1200) BP: (80-119)/(53-84) 111/58 (12/01 1200) SpO2:  [92 %-98 %] 96 % (12/01 3299)   Basic Metabolic Panel: Recent Labs  Lab 08/13/22 0225 08/13/22 0503 08/14/22 0452  NA 138 140 138  K 3.8 3.9 3.7  CL 105 103 102  CO2 24  --  26  GLUCOSE 110* 100* 100*  BUN '21 22 19  '$ CREATININE 1.40* 1.40* 1.32*  CALCIUM 8.7*  --  8.5*      CBC: Recent Labs  Lab 08/13/22 0225 08/13/22 0503 08/14/22 0452  WBC 8.5  --  7.4  NEUTROABS 7.0  --   --   HGB 14.8 13.9 13.5  HCT 43.6 41.0 39.9  MCV 88.6  --  88.1  PLT 174  --  146*     Coagulation Studies: Recent Labs    08/13/22 0225 08/14/22 0452  LABPROT 18.4* 18.0*  INR 1.5* 1.5*      Examination:   Physical Exam Gen: A&Ox4, NAD HEENT: Atraumatic, normocephalic; oropharynx clear, tongue without atrophy or fasciculations. Resp: CTAB, normal work of breathing CV: RRR, extremities appear well-perfused. Abd: soft/NT/ND Extrem: Nml bulk; no cyanosis, clubbing, or edema.  Neuro: *MS: A&O x4. Follows multi-step commands.  *Speech: no dysarthria or aphasia, able to name and repeat. *CN:    I: Deferred   II,III: PERRLA, VFF by confrontation, optic discs not visualized 2/2 pupillary constriction   III,IV,VI: EOMI w/o nystagmus, no ptosis   V: Sensation intact from V1 to V3 to LT   VII: Eyelid closure was full.  Smile symmetric.   VIII: Hearing intact to voice   IX,X: Voice normal, palate elevates symmetrically    XI: SCM/trap 5/5 bilat   XII: Tongue protrudes midline, no atrophy or fasciculations  *Motor:  Normal bulk.  No tremor, rigidity or bradykinesia. No pronator drift.   Strength: Dlt Bic Tri WE WrF FgS Gr HF KnF KnE PlF DoF    Left '5 5 5 5 5 5 5 5 5 5 5 5    '$ Right '5 5 5 5 5 5 5 5 5 5 5 5   '$ *Sensory: Intact to light touch, pinprick, temperature vibration throughout. Symmetric. Propioception intact bilat.  No double-simultaneous extinction.  *Coordination:  Finger-to-nose, heel-to-shin, rapid alternating motions were intact. *Reflexes:  2+ and symmetric throughout without clonus; toes down-going bilat *Gait: deferred, just had LP  Noting respiratory viral panel is positive for RSV  Procalcitonin less than 10  Assessment:  Patient underwent IR LP this afternoon, results pending. He is on bedrest after therefore I was not able to examine  his gait he was not able to work with PT this afternoon.  He has no objective weakness on my examination today and has 1+ reflexes in the bilateral patella and 2+ at the bilateral Achilles. He is not encephalopathic for me today. Will re-examine tomorrow including gait and f/u on outstanding CSF studies.   RSV in children is associated with encephalitis but this is not reported in adults  Plan: - Restart eliquis when radiology feels it is safe to do so - F/u outstanding CSF studies: cell count x2 tubes, protein, glucose, gram stain, culture, OCB, IgG index, VDRL, meningitis/encephalitis panel, ENC2 autoimmune encephalopathy panel sent out to [facility] clinic. Requested one tube be saved for any add on studies deemed appropriate. -0.5 mg x 1 dose as needed for MRI scans, reordered MRI brain with contrast, MRI cervical spine, MRI thoracic spine with and without still pending  -Noting patient has tolerated 0.25 mg dose at 10 AM as well as at 1500; MRI was attempted at 1400 without success - Will continue to follow  Su Monks, MD [facility] [phone]  If 7pm- 7am, please page neurology on call as listed in [place].

## 2022-08-14 NOTE — Procedures (Signed)
Patient Name: Joseph Morrow  MRN: [id number]  Epilepsy Attending: Lora Havens  Referring Physician/Provider: Lorenza Chick, MD  Date: 08/14/2022 Duration: 25.51 mins  Patient history: 76 year old male with altered mental status.  EEG to evaluate for seizure.  Level of alertness: Awake, drowsy  AEDs during EEG study: None  Technical aspects: This EEG study was done with scalp electrodes positioned according to the 10-20 International system of electrode placement. Electrical activity was reviewed with band pass filter of 1-'70Hz'$ , sensitivity of 7 uV/mm, display speed of 79m/sec with a '60Hz'$  notched filter applied as appropriate. EEG data were recorded continuously and digitally stored.  Video monitoring was available and reviewed as appropriate.  Description: The posterior dominant rhythm consists of 9 Hz activity of moderate voltage (25-35 uV) seen predominantly in posterior head regions, symmetric and reactive to eye opening and eye closing. Drowsiness was characterized by attenuation of the posterior background rhythm. Physiologic photic driving was not seen during photic stimulation.  No EEG change was seen during hyperventilation.  IMPRESSION: This study is within normal limits. No seizures or epileptiform discharges were seen throughout the recording.  A normal interictal EEG does not exclude the diagnosis of epilepsy.  Berenise Hunton OBarbra Sarks

## 2022-08-14 NOTE — ED Notes (Signed)
Pt having frequent runs of triplet PVCs.

## 2022-08-14 NOTE — Progress Notes (Signed)
PT Cancellation Note  Patient Details Name: Joseph Morrow MRN: [id number] DOB: 12-Jul-1946   Cancelled Treatment:    Reason Eval/Treat Not Completed: Active bedrest order Pt just returned from lumbar puncture and reports 12 hr bedrest (RN confirmed 12 hr bedrest).  Will f/u later date. Abran Richard, PT Acute Rehab [facility] [phone]   Karlton Lemon 08/14/2022, 12:40 PM

## 2022-08-14 NOTE — ED Notes (Signed)
Physician at bedside.

## 2022-08-14 NOTE — Procedures (Signed)
PROCEDURE SUMMARY:  Successful fluoroscopic guided lumbar puncture. No immediate complications.  Pt tolerated well.   EBL = none  Please see full dictation in imaging section of Epic for procedure details.  Electronically signed by: Ardis Rowan, PA-C 09/10/2021 9:41 AM

## 2022-08-14 NOTE — ED Notes (Signed)
Pt having occasional multifocal PVCs

## 2022-08-15 DIAGNOSIS — J121 Respiratory syncytial virus pneumonia: Secondary | ICD-10-CM | POA: Diagnosis not present

## 2022-08-15 LAB — CBC
HCT: 40.3 % (ref 39.0–52.0)
Hemoglobin: 13.3 g/dL (ref 13.0–17.0)
MCH: 29.2 pg (ref 26.0–34.0)
MCHC: 33 g/dL (ref 30.0–36.0)
MCV: 88.4 fL (ref 80.0–100.0)
Platelets: 142 10*3/uL — ABNORMAL LOW (ref 150–400)
RBC: 4.56 MIL/uL (ref 4.22–5.81)
RDW: 13.4 % (ref 11.5–15.5)
WBC: 7.2 10*3/uL (ref 4.0–10.5)
nRBC: 0 % (ref 0.0–0.2)

## 2022-08-15 LAB — HEPATIC FUNCTION PANEL
ALT: 34 U/L (ref 0–44)
AST: 26 U/L (ref 15–41)
Albumin: 2.6 g/dL — ABNORMAL LOW (ref 3.5–5.0)
Alkaline Phosphatase: 59 U/L (ref 38–126)
Bilirubin, Direct: 0.2 mg/dL (ref 0.0–0.2)
Indirect Bilirubin: 0.6 mg/dL (ref 0.3–0.9)
Total Bilirubin: 0.8 mg/dL (ref 0.3–1.2)
Total Protein: 5 g/dL — ABNORMAL LOW (ref 6.5–8.1)

## 2022-08-15 LAB — BASIC METABOLIC PANEL
Anion gap: 9 (ref 5–15)
BUN: 14 mg/dL (ref 8–23)
CO2: 25 mmol/L (ref 22–32)
Calcium: 8.4 mg/dL — ABNORMAL LOW (ref 8.9–10.3)
Chloride: 104 mmol/L (ref 98–111)
Creatinine, Ser: 1.28 mg/dL — ABNORMAL HIGH (ref 0.61–1.24)
GFR, Estimated: 58 mL/min — ABNORMAL LOW (ref >60)
Glucose, Bld: 92 mg/dL (ref 70–99)
Potassium: 3.8 mmol/L (ref 3.5–5.1)
Sodium: 138 mmol/L (ref 135–145)

## 2022-08-15 NOTE — Discharge Summary (Signed)
Physician Discharge Summary   Joseph: Joseph Morrow MRN: [id number] DOB: 10-Oct-1945  Admit date:     08/13/2022  Discharge date: 08/15/22  Discharge Physician: Edwin Dada   PCP: Ginger Organ., MD     Recommendations at discharge:  Follow up with PCP Dr. Brigitte Pulse in 1 week for acute steroid-induced muscle weakness Follow up with [facility] for chronic progressive abnormal gait Follow up Zinc, Copper, vitamin E, thiamine, CSF VZV, CSF VDRL, CSF culture, oligoclonal bands, CSF HSV, CSF IgG, and ENC2 autoimmune encephalopathy panel     Discharge Diagnoses: Principal Problem:   RSV (respiratory syncytial virus pneumonia) Active Problems:   Acute ataxia and muscle weakness   Chronic gait disturbance   Typical atrial flutter (HCC)   Hypertrophic cardiomyopathy ([place])   ICD (implantable cardioverter-defibrillator) in place   Secondary hypercoagulable state [facility])   Transaminitis    Hospital Course: Joseph Morrow is a 76 y.o. 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  In the ER, he also described symptoms of ataxia, generalized weakness, and nightmares, and so Neurology were consulted.       * RSV (respiratory syncytial virus pneumonia) Small infiltrate on CXR, stable on room air, symptoms mild.     Recommend supportive care.    Generalized weakness Gait disturbance Encephalopathy ruled out.  MRI brain, cervical and lumbar spine without contrast was motion degraded but otherwise within normal limits.  LP obtained, showed normal cell counts, glucose normal, protein slightly elevated, meningitis PCR panel negative.  B12 normal.    Joseph improved over 30 hours observation.  Gait returned to his prior baseline.  Worked with PT and was normal.  Consensus was that his condition was precipitated by prednisone only, and that outstanding  labs could be followed up outpatient.       Typical atrial flutter (HCC)  Transaminitis Transient, resolved  Secondary hypercoagulable state ([place]) Due to atrial flutter             The [place] Registry was reviewed for this Joseph prior to discharge.  Consultants: Neurology Procedures performed: LP, MRI brain and cervical/lumbar spine  Disposition: Home   DISCHARGE MEDICATION: Allergies as of 08/15/2022       Reactions   Prednisone Anxiety   Dextromethorphan Other (See Comments)   nervous   Meclizine    Caused increased heart rate and constipation   Versed [midazolam] Other (See Comments)   "hallucinations, headache"   Zofran [ondansetron]    Caused increased heart rate and constipation   Antihistamines, Diphenhydramine-type Anxiety        Medication List     TAKE these medications    acetaminophen 500 MG tablet Commonly known as: TYLENOL Take 500-1,000 mg by mouth every 6 (six) hours as needed (pain.).   ALIGN PO Take 1 capsule by mouth in the morning.   amiodarone 200 MG tablet Commonly known as: Pacerone Take 1 tablet (200 mg total) by mouth 2 (two) times daily for 30 days, THEN 1 tablet (200 mg total) daily. Start taking on: July 08, 2022   benzonatate 200 MG capsule Commonly known as: TESSALON Take 200 mg by mouth 3 (three) times daily.   calcium carbonate 1500 (600 Ca) MG Tabs tablet Commonly known as: OSCAL Take 600 mg by mouth in the morning and at bedtime.   Co Q 10 100 MG Caps Take 100  mg by mouth in the morning.   COSAMIN DS PO Take 1 tablet by mouth in the morning.   diltiazem 30 MG tablet Commonly known as: Cardizem Cardizem '30mg'$  -- take 1 tablet every 4 hours AS NEEDED for heart rate >100   Eliquis 5 MG Tabs tablet Generic drug: apixaban TAKE 1 TABLET BY MOUTH TWICE A DAY   furosemide 20 MG tablet Commonly known as: Lasix Take 1 tablet by mouth daily until 11/16 then only as needed for  swelling/weight gain   metoprolol succinate 50 MG 24 hr tablet Commonly known as: TOPROL-XL TAKE 1 TABLET TWICE A DAY WITH OR IMMEDIATELY FOLLOWING A MEAL What changed: See the new instructions.   omeprazole 20 MG tablet Commonly known as: PRILOSEC OTC Take 10 mg by mouth in the morning.   potassium citrate 10 MEQ (1080 MG) SR tablet Commonly known as: UROCIT-K Take 1 tablet (10 mEq total) by mouth daily as needed (with lasix (swelling/weight gain)).   Vitamin D3 50 MCG (2000 UT) Tabs Take 2,000 Units by mouth in the morning and at bedtime.   Zinc 50 MG Caps Take 50 mg by mouth 2 (two) times daily.        Follow-up Information     [facility] Follow up.   Why: Referral has been sent.  Call if you haven't heard from them in 7-10 days Contact information: [address]     [place] [address] [phone]        Ginger Organ., MD. Schedule an appointment as soon as possible for a visit in 1 week(s).   Specialty: Internal Medicine Contact information: [place] [address] [phone]                 Discharge Instructions     Ambulatory referral to Neurology   Complete by: As directed    An appointment is requested in approximately: 8-12 weeks   Ambulatory referral to Physical Therapy   Complete by: As directed    For diagnosis: R26 Gait abnormality   Discharge instructions   Complete by: As directed    **IMPORTANT DISCHARGE INSTRUCTIONS**   From Dr. Loleta Books: You were admitted for abnormal gait and weakness  Here, we confirmed that you had RSV  Thankfully, your RSV was relatively mild, you did not have a pneumonia on chest imaging and you did not require oxygen  Continue supportive cares with good hydration, cough drops, and you may try over the counter cough medicine that contains dextromethorphan-guiafenesin, like Robitussin   For the abnormal gait, you had an MRI of the  brain and spine that was reassuring, ruled out a mass, bleeding or a stroke  You had a lumbar puncture, that ruled out infection of the fluid around the brain (meningitis).  We held the prednisone that you had been prescribed and your symptoms improved.  I suspect you are intolerant to prednisone, and should avoid this except under special circumstances in the future.  Follow up with your doctor in 1-2 weeks  We have sent a referral to a Neurologist for follow up of your abnormal gait, and they will contact you for an appointment.  See the contact info below   Increase activity slowly   Complete by: As directed        Discharge Exam: Filed Weights   08/13/22 0212  Weight: 83.4 kg    General: Pt is alert, awake, not in acute distress Cardiovascular: RRR, nl  S1-S2, no murmurs appreciated.   No LE edema.   Respiratory: Normal respiratory rate and rhythm.  CTAB without rales or wheezes. Abdominal: Abdomen soft and non-tender.  No distension or HSM.   Neuro/Psych: Strength symmetric in upper and lower extremities.  Judgment and insight appear normal.   Condition at discharge: good  The results of significant diagnostics from this hospitalization (including imaging, microbiology, ancillary and laboratory) are listed below for reference.   Imaging Studies: DG FL GUIDED LUMBAR PUNCTURE  Result Date: 08/14/2022 CLINICAL DATA:  Encephalopathy and ataxic gait. Request for lumbar puncture. EXAM: LUMBAR PUNCTURE UNDER FLUOROSCOPY PROCEDURE: An appropriate skin entry site was determined fluoroscopically. Operator donned sterile gloves and mask. Skin site was marked, then prepped with Betadine, draped in usual sterile fashion, and infiltrated locally with 1% lidocaine. A 20 gauge spinal needle advanced into the thecal sac at L3-4 from a left interlaminar approach. Clear colorless CSF spontaneously returned, with opening pressure of 26 cm water. 11.5 ml CSF were collected and divided among 4  sterile vials for the requested laboratory studies. The needle was then removed. The Joseph tolerated the procedure well and there were no complications. FLUOROSCOPY: Radiation Exposure Index (as provided by the fluoroscopic device): 3.4 mGy Kerma IMPRESSION: Technically successful lumbar puncture under fluoroscopy. This exam was performed by Joseph Eagle, PA-C, and was supervised and interpreted by Dr. Suzy Bouchard. Electronically Signed   By: Suzy Bouchard M.D.   On: 08/14/2022 11:34   EEG adult  Result Date: 08/14/2022 Joseph Havens, MD     08/14/2022  8:54 AM Joseph Morrow MRN: [id number] Epilepsy Attending: Lora Morrow Referring Physician/Provider: Lorenza Chick, MD Date: 08/14/2022 Duration: 25.51 mins Joseph history: 76 year old male with altered mental status.  EEG to evaluate for seizure. Level of alertness: Awake, drowsy AEDs during EEG study: None Technical aspects: This EEG study was done with scalp electrodes positioned according to the 10-20 International system of electrode placement. Electrical activity was reviewed with band pass filter of 1-'70Hz'$ , sensitivity of 7 uV/mm, display speed of 11m/sec with a '60Hz'$  notched filter applied as appropriate. EEG data were recorded continuously and digitally stored.  Video monitoring was available and reviewed as appropriate. Description: The posterior dominant rhythm consists of 9 Hz activity of moderate voltage (25-35 uV) seen predominantly in posterior head regions, symmetric and reactive to eye opening and eye closing. Drowsiness was characterized by attenuation of the posterior background rhythm. Physiologic photic driving was not seen during photic stimulation.  No EEG change was seen during hyperventilation. IMPRESSION: This study is within normal limits. No seizures or epileptiform discharges were seen throughout the recording. A normal interictal EEG does not exclude the diagnosis of epilepsy. PLora Morrow   MR CERVICAL SPINE WO CONTRAST  Result Date: 08/13/2022 CLINICAL DATA:  Spontaneous intracranial hypotension. Rule out CSF leak EXAM: MRI CERVICAL SPINE WITHOUT CONTRAST TECHNIQUE: Multiplanar, multisequence MR imaging of the cervical spine was performed. No intravenous contrast was administered. COMPARISON:  None Available. FINDINGS: Alignment: Motion degraded study Normal alignment Vertebrae: Negative for fracture or mass. Cord: Allowing for mild motion, no cord signal abnormality identified. Posterior Fossa, vertebral arteries, paraspinal tissues: Right mastoid effusion otherwise negative soft tissues of the neck. No soft tissue fluid collection in the neck. Disc levels: C2-3: Mild disc degeneration without stenosis C3-4: Mild disc degeneration without stenosis C4-5: Mild disc and mild facet degeneration. Bilateral foraminal stenosis. C5-6: Marked disc space narrowing. Question prior surgical fusion or degenerative change. Mild  endplate spurring with mild foraminal narrowing bilaterally. Mild facet degeneration C6-7: Disc degeneration with diffuse uncinate spurring. Mild foraminal narrowing bilaterally C7-T1: Negative IMPRESSION: 1. Motion degraded study. 2. Cervical spondylosis. Mild foraminal narrowing bilaterally at C4-5, C5-6, C6-7 due to spurring. Electronically Signed   By: Franchot Gallo M.D.   On: 08/13/2022 15:57   MR BRAIN WO CONTRAST  Result Date: 08/13/2022 CLINICAL DATA:  Mental status change, unknown cause. EXAM: MRI HEAD WITHOUT CONTRAST TECHNIQUE: Multiplanar, multiecho pulse sequences of the brain and surrounding structures were obtained without intravenous contrast. COMPARISON:  Head CT 08/13/2022 FINDINGS: Brain: There is no evidence of an acute infarct, intracranial hemorrhage, mass, midline shift, or extra-axial fluid collection. There is mild generalized cerebral atrophy. A few small T2 FLAIR hyperintensities in the cerebral white matter are within normal limits for age. Vascular:  Major intracranial vascular flow voids are preserved. Skull and upper cervical spine: Unremarkable bone marrow signal. Sinuses/Orbits: Bilateral cataract extraction. Mild mucosal thickening in the paranasal sinuses. Small right mastoid effusion. Other: None. IMPRESSION: 1. No acute intracranial abnormality. 2. Mild cerebral atrophy. Electronically Signed   By: Logan Bores M.D.   On: 08/13/2022 15:38   CT HEAD WO CONTRAST  Result Date: 08/13/2022 CLINICAL DATA:  Altered mental status. EXAM: CT HEAD WITHOUT CONTRAST TECHNIQUE: Contiguous axial images were obtained from the base of the skull through the vertex without intravenous contrast. RADIATION DOSE REDUCTION: This exam was performed according to the departmental dose-optimization program which includes automated exposure control, adjustment of the mA and/or kV according to Joseph size and/or use of iterative reconstruction technique. COMPARISON:  None Available. FINDINGS: Brain: There is mild cerebral atrophy with widening of the extra-axial spaces and ventricular dilatation. There are areas of decreased attenuation within the white matter tracts of the supratentorial brain, consistent with microvascular disease changes. Vascular: No hyperdense vessel or unexpected calcification. Skull: Normal. Negative for fracture or focal lesion. Sinuses/Orbits: No acute finding. Other: None. IMPRESSION: 1. No acute intracranial abnormality. 2. Generalized cerebral atrophy and microvascular disease changes of the supratentorial brain. Electronically Signed   By: Virgina Norfolk M.D.   On: 08/13/2022 04:53   DG Chest Port 1 View  Result Date: 08/13/2022 CLINICAL DATA:  Cough. EXAM: PORTABLE CHEST 1 VIEW COMPARISON:  October 10, 2021 FINDINGS: There is a dual lead AICD. The heart size and mediastinal contours are within normal limits. Mild atelectasis and/or early infiltrate is seen within the right lung base. There is no evidence of a pleural effusion or  pneumothorax. The visualized skeletal structures are unremarkable. IMPRESSION: Mild right basilar atelectasis and/or early infiltrate. Electronically Signed   By: Virgina Norfolk M.D.   On: 08/13/2022 04:21    Microbiology: Results for orders placed or performed during the hospital encounter of 08/13/22  Respiratory (~20 pathogens) panel by PCR     Status: Abnormal   Collection Time: 08/13/22  9:00 AM   Specimen: Nasopharyngeal Swab; Respiratory  Result Value Ref Range Status   Adenovirus NOT DETECTED NOT DETECTED Final   Coronavirus 229E NOT DETECTED NOT DETECTED Final    Comment: (NOTE) The Coronavirus on the Respiratory Panel, DOES NOT test for the novel  Coronavirus (2019 nCoV)    Coronavirus HKU1 NOT DETECTED NOT DETECTED Final   Coronavirus NL63 NOT DETECTED NOT DETECTED Final   Coronavirus OC43 NOT DETECTED NOT DETECTED Final   Metapneumovirus NOT DETECTED NOT DETECTED Final   Rhinovirus / Enterovirus NOT DETECTED NOT DETECTED Final   Influenza A NOT DETECTED NOT  DETECTED Final   Influenza B NOT DETECTED NOT DETECTED Final   Parainfluenza Virus 1 NOT DETECTED NOT DETECTED Final   Parainfluenza Virus 2 NOT DETECTED NOT DETECTED Final   Parainfluenza Virus 3 NOT DETECTED NOT DETECTED Final   Parainfluenza Virus 4 NOT DETECTED NOT DETECTED Final   Respiratory Syncytial Virus DETECTED (A) NOT DETECTED Final   Bordetella pertussis NOT DETECTED NOT DETECTED Final   Bordetella Parapertussis NOT DETECTED NOT DETECTED Final   Chlamydophila pneumoniae NOT DETECTED NOT DETECTED Final   Mycoplasma pneumoniae NOT DETECTED NOT DETECTED Final    Comment: Performed at [facility], [place] [address], [place], [place] [address]  SARS Coronavirus 2 by RT PCR (hospital order, performed in [facility] hospital lab) *cepheid single result test* Nasopharyngeal Swab     Status: None   Collection Time: 08/13/22  9:00 AM   Specimen: Nasopharyngeal Swab; Nasal Swab  Result Value Ref Range Status    SARS Coronavirus 2 by RT PCR NEGATIVE NEGATIVE Final    Comment: (NOTE) SARS-CoV-2 target nucleic acids are NOT DETECTED.  The SARS-CoV-2 RNA is generally detectable in upper and lower respiratory specimens during the acute phase of infection. The lowest concentration of SARS-CoV-2 viral copies this assay can detect is 250 copies / mL. A negative result does not preclude SARS-CoV-2 infection and should not be used as the sole basis for treatment or other Joseph management decisions.  A negative result may occur with improper specimen collection / handling, submission of specimen other than nasopharyngeal swab, presence of viral mutation(s) within the areas targeted by this assay, and inadequate number of viral copies (<250 copies / mL). A negative result must be combined with clinical observations, Joseph history, and epidemiological information.  Fact Sheet for Patients:   https://www.patel.info/  Fact Sheet for Healthcare Providers: https://hall.com/  This test is not yet approved or  cleared by the [place] FDA and has been authorized for detection and/or diagnosis of SARS-CoV-2 by FDA under an Emergency Use Authorization (EUA).  This EUA will remain in effect (meaning this test can be used) for the duration of the COVID-19 declaration under Section 564(b)(1) of the Act, 21 U.S.C. section 360bbb-3(b)(1), unless the authorization is terminated or revoked sooner.  Performed at [facility], [place] [address] [place], [place] [address]     Labs: CBC: Recent Labs  Lab 08/13/22 0225 08/13/22 0503 08/14/22 0452 08/15/22 0343  WBC 8.5  --  7.4 7.2  NEUTROABS 7.0  --   --   --   HGB 14.8 13.9 13.5 13.3  HCT 43.6 41.0 39.9 40.3  MCV 88.6  --  88.1 88.4  PLT 174  --  146* 174*   Basic Metabolic Panel: Recent Labs  Lab 08/13/22 0225 08/13/22 0503 08/14/22 0452 08/15/22 0343  NA 138 140 138 138  K 3.8 3.9 3.7 3.8   CL 105 103 102 104  CO2 24  --  26 25  GLUCOSE 110* 100* 100* 92  BUN '21 22 19 14  '$ CREATININE 1.40* 1.40* 1.32* 1.28*  CALCIUM 8.7*  --  8.5* 8.4*   Liver Function Tests: Recent Labs  Lab 08/13/22 0225 08/15/22 0343  AST 43* 26  ALT 62* 34  ALKPHOS 74 59  BILITOT 0.7 0.8  PROT 6.1* 5.0*  ALBUMIN 3.3* 2.6*   CBG: Recent Labs  Lab 08/13/22 0229  GLUCAP 99    Discharge time spent: approximately 35 minutes spent on discharge counseling, evaluation of Joseph  on day of discharge, and coordination of discharge planning with nursing, social work, pharmacy and case management  Signed: Edwin Dada, MD [facility] 08/15/2022

## 2022-08-15 NOTE — Progress Notes (Signed)
Neurology progress note  Subjective: Patient underwent IR LP yesterday. OP was 26. He is ambulating in the room with PT. Gait is relatively steady and he is unassisted. Romberg test positive with slight wobbling, but he does not feel off balance. He clarified that he saw his PCP and took steroids for 2 days prior to coming to the hospital on the 30th.   He does state that he does not react well to steroids and he was on steroids the day before he presented to the hospital for his respiratory symptoms   Initial CSF results: tube 1: RBC 0 WBC 1 tube 4: RBC 0 WBC 2 glucose 66 protein v mildly elevated at 58 Gram stain negative meningitis/encephalitis PCR panel pan-neg   EEG 12/1 WNL  Patient was unable to tolerate full MRI sequences due to anxiety, patient and wife note he has never had this kind of anxiety before and has tolerated MRIs in the past without any need for sedation.  MRI brain 1. No acute intracranial abnormality. 2. Mild cerebral atrophy.  MRI c spine wo 1. Motion degraded study. 2. Cervical spondylosis. Mild foraminal narrowing bilaterally at C4-5, C5-6, C6-7 due to spurring.  CNS imaging personally reviewed I agree with above interpretations.  Objective  Current vital signs: BP 110/82 (BP Location: Right Arm)   Pulse 64   Temp 98 F (36.7 C) (Oral)   Resp 18   Ht '5\' 10"'$  (1.778 m)   Wt 83.4 kg   SpO2 98%   BMI 26.38 kg/m  Vital signs in last 24 hours: Temp:  [98 F (36.7 C)-98.5 F (36.9 C)] 98 F (36.7 C) (12/02 2671) Pulse Rate:  [57-67] 64 (12/02 0902) Resp:  [14-18] 18 (12/02 2458) BP: (86-122)/(59-82) 110/82 (12/02 0902) SpO2:  [95 %-99 %] 98 % (12/02 0998)   Basic Metabolic Panel: Recent Labs  Lab 08/13/22 0225 08/13/22 0503 08/14/22 0452 08/15/22 0343  NA 138 140 138 138  K 3.8 3.9 3.7 3.8  CL 105 103 102 104  CO2 24  --  26 25  GLUCOSE 110* 100* 100* 92  BUN '21 22 19 14  '$ CREATININE 1.40* 1.40* 1.32* 1.28*  CALCIUM 8.7*  --   8.5* 8.4*     CBC: Recent Labs  Lab 08/13/22 0225 08/13/22 0503 08/14/22 0452 08/15/22 0343  WBC 8.5  --  7.4 7.2  NEUTROABS 7.0  --   --   --   HGB 14.8 13.9 13.5 13.3  HCT 43.6 41.0 39.9 40.3  MCV 88.6  --  88.1 88.4  PLT 174  --  146* 142*     Coagulation Studies: Recent Labs    08/13/22 0225 08/14/22 0452  LABPROT 18.4* 18.0*  INR 1.5* 1.5*      Examination:   Physical Exam Gen: A&Ox4, NAD HEENT: Atraumatic, normocephalic; oropharynx clear, tongue without atrophy or fasciculations. Resp: CTAB, normal work of breathing CV: RRR, extremities appear well-perfused. Abd: soft/NT/ND Extrem: Nml bulk; no cyanosis, clubbing, or edema.  Neuro: *MS: A&O x4. Follows multi-step commands.  *Speech: no dysarthria or aphasia, able to name and repeat. *CN:    I: Deferred   II,III: PERRLA, VFF by confrontation, optic discs not visualized 2/2 pupillary constriction   III,IV,VI: EOMI w/o nystagmus, no ptosis   V: Sensation intact from V1 to V3 to LT   VII: Eyelid closure was full.  Smile symmetric.   VIII: Hearing intact to voice   IX,X: Voice normal, palate elevates symmetrically    XI:  SCM/trap 5/5 bilat   XII: Tongue protrudes midline, no atrophy or fasciculations  *Motor:   Normal bulk.  No tremor, rigidity or bradykinesia. No pronator drift.   Strength: Dlt Bic Tri WE WrF FgS Gr HF KnF KnE PlF DoF    Left '5 5 5 5 5 5 5 5 5 5 5 5    '$ Right '5 5 5 5 5 5 5 5 5 5 5 5   '$ *Sensory: Intact to light touch, pinprick, temperature vibration throughout. Symmetric. Propioception intact bilat.  No double-simultaneous extinction.  *Coordination:  Finger-to-nose, heel-to-shin, rapid alternating motions were intact. *Reflexes:  2+ and symmetric throughout without clonus; toes down-going bilat *Gait: steady, romberg test positive  Noting respiratory viral panel is positive for RSV  Procalcitonin less than 10  Assessment:  76 y.o. male with a PMHx of cataracts, ED, hearing loss,  hypertrophic cardiomyopathy, mitral regurgitation, PAF (on Eliquis), tubular adenoma of colon and ventricular tachycardia s/p [employer] ICD who presents to the ED with diffuse limb weakness and imbalance. He is positive for RSV. He was previously prescribed antibiotics and steroids for his respiratory infection. He saw his PCP on 11/27 and took the steroids and antibiotics for 2 days (stopped 11/29) due to nightmares, anxiety, and abnormal gait.  RSV in children is associated with encephalitis but this is not reported in adults, favor this to be steroid induced given improvement with holding steroids. Patient/wife initially reported last dose of steroid sever days prior to admission to attending MD, but this has been clarified and confirmed to have been taken day of presentation last.   Plan: - F/u outstanding CSF studies: culture, OCB, IgG index, VDRL, meningitis/encephalitis panel, ENC2 autoimmune encephalopathy panel sent out to [facility] clinic on an outpatient basis - Given improvement can hold off on further MRI at this time - Outpatient neurology follow up for gradually progressive gait impairment - Outpatient PT  Lesleigh Noe MD-PhD [facility] [phone] Available 7 AM to 7 PM, outside these hours please contact Neurologist on call listed on AMION

## 2022-08-15 NOTE — Progress Notes (Signed)
Physical Therapy Evaluation Patient Details Name: Joseph Morrow MRN: [id number] DOB: 1946-01-28 Today's Date: 08/15/2022  History of Present Illness  [name] is a 76 y.o. M who presented with 5 days progressive cough and congestion. In the ER, he also described symptoms of ataxia, generalized weakness, and nightmares, and so Neurology were consulted. Admitted with RSV.  PMH:  ICD, Aflutter s/p ablation x2, on Eliquis and CKD IIIa baseline 1.3 , BPPV treated outpatient July 2023  Clinical Impression  Pt was seen for gait imbalance and instability, in which basic walking with no challenges was slow but stable.  However, his balance with high level berg skills has been fairly unsteady with R ankle instability observed in the process.   Had received BPPV tx in Summer 2023 but is not symptomatic and has no nystagmus observed.  Recommend outpatient therapy for high level balance challenges and for work on possible proprioceptive issues of R ankle.  Follow along with him for acute PT goals with stairs to be covered as time permits with his stay.  Will have to try steps within his room given airborne precautions.      Recommendations for follow up therapy are one component of a multi-disciplinary discharge planning process, led by the attending physician.  Recommendations may be updated based on patient status, additional functional criteria and insurance authorization.  Follow Up Recommendations Outpatient PT      Assistance Recommended at Discharge Intermittent Supervision/Assistance  Patient can return home with the following  A little help with walking and/or transfers;Assistance with cooking/housework;Help with stairs or ramp for entrance    Equipment Recommendations None recommended by PT  Recommendations for Other Services       Functional Status Assessment Patient has had a recent decline in their functional status and demonstrates the ability to make significant improvements in  function in a reasonable and predictable amount of time.     Precautions / Restrictions Precautions Precautions: Fall Precaution Comments: high level balance changes Restrictions Weight Bearing Restrictions: No      Mobility  Bed Mobility Overal bed mobility: Modified Independent                  Transfers Overall transfer level: Needs assistance Equipment used: None Transfers: Sit to/from Stand Sit to Stand: Supervision           General transfer comment: supervision for safety    Ambulation/Gait Ambulation/Gait assistance: Supervision, Min guard Gait Distance (Feet): 60 Feet (30 x 2 due to airborne precautions) Assistive device: None Gait Pattern/deviations: Step-through pattern, Wide base of support Gait velocity: reduced Gait velocity interpretation: <1.31 ft/sec, indicative of household ambulator Pre-gait activities: standing balance ck General Gait Details: mod alterations of balance with high level skills but std walking is fairly stable  Stairs            Wheelchair Mobility    Modified Rankin (Stroke Patients Only)       Balance Overall balance assessment: Needs assistance Sitting-balance support: Feet supported Sitting balance-Leahy Scale: Good     Standing balance support: No upper extremity supported Standing balance-Leahy Scale: Fair                   Standardized Balance Assessment Standardized Balance Assessment : Berg Balance Test Berg Balance Test Sit to Stand: Able to stand without using hands and stabilize independently Standing Unsupported: Able to stand safely 2 minutes Sitting with Back Unsupported but Feet Supported on Floor or Stool: Able to sit safely and  securely 2 minutes Stand to Sit: Sits safely with minimal use of hands Transfers: Able to transfer safely, minor use of hands Standing Unsupported with Eyes Closed: Able to stand 10 seconds safely Standing Ubsupported with Feet Together: Able to place feet  together independently and stand for 1 minute with supervision From Standing, Reach Forward with Outstretched Arm: Can reach confidently >25 cm (10") From Standing Position, Pick up Object from Floor: Able to pick up shoe, needs supervision From Standing Position, Turn to Look Behind Over each Shoulder: Looks behind one side only/other side shows less weight shift Turn 360 Degrees: Able to turn 360 degrees safely but slowly Standing Unsupported, Alternately Place Feet on Step/Stool: Able to complete 4 steps without aid or supervision Standing Unsupported, One Foot in Front: Loses balance while stepping or standing Standing on One Leg: Unable to try or needs assist to prevent fall Total Score: 41         Pertinent Vitals/Pain Pain Assessment Pain Assessment: No/denies pain    Home Living Family/patient expects to be discharged to:: Private residence Living Arrangements: Spouse/significant other Available Help at Discharge: Family;Available 24 hours/day Type of Home: House Home Access: Stairs to enter Entrance Stairs-Rails: None Entrance Stairs-Number of Steps: 3   Home Layout: One level Home Equipment: Shower seat - built in Additional Comments: pt feels he has been unsteady for a long time    Prior Function Prior Level of Function : Independent/Modified Independent;Driving             Mobility Comments: limited by a-fib       Hand Dominance   Dominant Hand: Right    Extremity/Trunk Assessment   Upper Extremity Assessment Upper Extremity Assessment: Defer to OT evaluation    Lower Extremity Assessment Lower Extremity Assessment: Overall WFL for tasks assessed    Cervical / Trunk Assessment Cervical / Trunk Assessment: Normal  Communication   Communication: No difficulties  Cognition Arousal/Alertness: Awake/alert Behavior During Therapy: WFL for tasks assessed/performed Overall Cognitive Status: Within Functional Limits for tasks assessed                                           General Comments General comments (skin integrity, edema, etc.): pt has a berg score indicating AD need but declines to use one    Exercises     Assessment/Plan    PT Assessment Patient needs continued PT services  PT Problem List Decreased balance;Decreased mobility;Decreased coordination       PT Treatment Interventions DME instruction;Gait training;Stair training;Functional mobility training;Therapeutic activities;Therapeutic exercise;Balance training;Neuromuscular re-education;Patient/family education    PT Goals (Current goals can be found in the Care Plan section)  Acute Rehab PT Goals Patient Stated Goal: to walk and regain steadiness PT Goal Formulation: With patient Time For Goal Achievement: 08/29/22 Potential to Achieve Goals: Good    Frequency Min 3X/week     Co-evaluation               AM-PAC PT "6 Clicks" Mobility  Outcome Measure Help needed turning from your back to your side while in a flat bed without using bedrails?: None Help needed moving from lying on your back to sitting on the side of a flat bed without using bedrails?: A Little Help needed moving to and from a bed to a chair (including a wheelchair)?: A Little Help needed standing up from a chair using your  arms (e.g., wheelchair or bedside chair)?: A Little Help needed to walk in hospital room?: A Little Help needed climbing 3-5 steps with a railing? : A Little 6 Click Score: 19    End of Session   Activity Tolerance: Patient tolerated treatment well Patient left: in bed;with family/visitor present;with nursing/sitter in room Nurse Communication: Mobility status PT Visit Diagnosis: Unsteadiness on feet (R26.81);Ataxic gait (R26.0)    Time: 4128-7867 PT Time Calculation (min) (ACUTE ONLY): 19 min   Charges:   PT Evaluation $PT Eval Moderate Complexity: 1 Mod         Ramond Dial 08/15/2022, 12:34 PM  Mee Hives, PT PhD Acute Rehab  Dept. Number: [facility]

## 2022-08-15 NOTE — Progress Notes (Signed)
Patient was discharged, left unit at 1705 with significant other in no obvious distress

## 2022-08-15 NOTE — Care Management CC44 (Signed)
Condition Code 44 Documentation Completed  Patient Details  Name: Joseph Morrow MRN: [id number] Date of Birth: 1945/12/20   Condition Code 44 given:  Yes Patient signature on Condition Code 44 notice:  Yes Documentation of 2 MD's agreement:  Yes Code 44 added to claim:  Yes    Verdell Carmine, RN 08/15/2022, 8:15 AM

## 2022-08-15 NOTE — Care Management Obs Status (Signed)
[facility] NOTIFICATION   Patient Details  Name: Donavon Kimrey MRN: [id number] Date of Birth: [birth year]   Medicare Observation Status Notification Given:  Yes    Verdell Carmine, RN 08/15/2022, 8:15 AM

## 2022-08-15 NOTE — Evaluation (Signed)
Occupational Therapy Evaluation Patient Details Name: Joseph Morrow MRN: [id number] DOB: [birth year] Today's Date: 08/15/2022   History of Present Illness Joseph Morrow is a 76 y.o. M who presented with 5 days progressive cough and congestion. In the ER, he also described symptoms of ataxia, generalized weakness, and nightmares, and so Neurology were consulted. Admitted with RSV.  PMH:  ICD, Aflutter s/p ablation x2, on Eliquis and CKD IIIa baseline 1.3 , BPPV treated outpatient July 2023   Clinical Impression   Patient evaluated by Occupational Therapy with no further acute OT needs identified. All education has been completed and the patient has no further questions.  See below for any follow-up Occupational Therapy or equipment needs. OT is signing off. Thank you for this referral.       Recommendations for follow up therapy are one component of a multi-disciplinary discharge planning process, led by the attending physician.  Recommendations may be updated based on patient status, additional functional criteria and insurance authorization.   Follow Up Recommendations  No OT follow up     Assistance Recommended at Discharge Intermittent Supervision/Assistance  Patient can return home with the following Assist for transportation;Assistance with cooking/housework;Help with stairs or ramp for entrance    Functional Status Assessment  Patient has had a recent decline in their functional status and demonstrates the ability to make significant improvements in function in a reasonable and predictable amount of time.  Equipment Recommendations  Other (comment) (Grab bar in shower. [name] agreeable)    Recommendations for Other Services PT consult     Precautions / Restrictions Precautions Precautions: None Restrictions Weight Bearing Restrictions: No      Mobility Bed Mobility Overal bed mobility: Modified Independent                  Transfers                           Balance Overall balance assessment: Mild deficits observed, not formally tested                                         ADL either performed or assessed with clinical judgement   ADL Overall ADL's : At baseline                                       General ADL Comments: Pt ambulating without RW but going slowly and carefully. Pt demonstrated Mod I bed mobility, intact UE strength, ROM, coordination and sensation, transfers with supervision and in-room ambulation with Min guard, progressing to supervision. Pt performed toilet transfer and shower transfer without assistance.     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 Tends to close one eye     Perception     Praxis      Pertinent Vitals/Pain Pain Assessment Pain Assessment: No/denies pain     Hand Dominance Right   Extremity/Trunk Assessment Upper Extremity Assessment Upper Extremity Assessment: Overall WFL for tasks assessed   Lower Extremity Assessment Lower Extremity Assessment: Defer to PT evaluation   Cervical / Trunk Assessment Cervical / Trunk Assessment: Normal   Communication  Communication Communication: No difficulties   Cognition Arousal/Alertness: Awake/alert Behavior During Therapy: WFL for tasks assessed/performed Overall Cognitive Status: Within Functional Limits for tasks assessed                                       General Comments       Exercises     Shoulder Instructions      Home Living Family/patient expects to be discharged to:: Private residence Living Arrangements: Spouse/significant other Available Help at Discharge: Family;Available 24 hours/day Type of Home: House Home Access: Stairs to enter [employer]  of Steps: 3 Entrance Stairs-Rails: None Home Layout: One level     Bathroom Shower/Tub: Occupational psychologist: Handicapped height (two walls)     Home Equipment: Shower seat - built in          Prior Functioning/Environment Prior Level of Function : Independent/Modified Independent;Driving             Mobility Comments: Walks about 3 miles a day until A-fib ADLs Comments: Independent        OT Problem List: Decreased activity tolerance;Impaired vision/perception      OT Treatment/Interventions:      OT Goals(Current goals can be found in the care plan section) Acute Rehab OT Goals Patient Stated Goal: Return to previous active lifestyle. OT Goal Formulation: All assessment and education complete, DC therapy Potential to Achieve Goals: Good  OT Frequency:      Co-evaluation              AM-PAC OT "6 Clicks" Daily Activity     Outcome Measure Help from another person eating meals?: None Help from another person taking care of personal grooming?: None Help from another person toileting, which includes using toliet, bedpan, or urinal?: None Help from another person bathing (including washing, rinsing, drying)?: None Help from another person to put on and taking off regular upper body clothing?: None Help from another person to put on and taking off regular lower body clothing?: None 6 Click Score: 24   End of Session Nurse Communication: Other (comment) (RN cleared OT to see)  Activity Tolerance: Patient tolerated treatment well Patient left: in bed;with call bell/phone within reach;with family/visitor present  OT Visit Diagnosis: Other abnormalities of gait and mobility (R26.89)                Time: 2924-4628 OT Time Calculation (min): 18 min Charges:  OT General Charges $OT Visit: 1 Visit OT Evaluation $OT Eval Low Complexity: 1 Low  Joseph Morrow, OT Acute Rehab Services Office: [phone] 08/15/2022  Joseph Morrow 08/15/2022, 8:54  AM

## 2022-08-16 LAB — IGG: IgG (Immunoglobin G), Serum: 753 mg/dL (ref 603–1613)

## 2022-08-16 LAB — HSV 1/2 PCR, CSF
HSV-1 DNA: NEGATIVE
HSV-2 DNA: NEGATIVE

## 2022-08-16 LAB — VZV PCR, CSF: VZV PCR, CSF: NEGATIVE

## 2022-08-17 LAB — IGG CSF INDEX
Albumin CSF-mCnc: 40 mg/dL (ref 15–55)
Albumin: 3.1 g/dL — ABNORMAL LOW (ref 3.8–4.8)
CSF IgG Index: 0.5 (ref 0.0–0.7)
IgG (Immunoglobin G), Serum: 819 mg/dL (ref 603–1613)
IgG, CSF: 5 mg/dL (ref 0.0–10.3)
IgG/Alb Ratio, CSF: 0.13 (ref 0.00–0.25)

## 2022-08-17 LAB — CSF CULTURE W GRAM STAIN
Culture: NO GROWTH
Gram Stain: NONE SEEN

## 2022-08-17 LAB — VITAMIN B1: Vitamin B1 (Thiamine): 146.6 nmol/L (ref 66.5–200.0)

## 2022-08-17 LAB — VDRL, CSF: VDRL Quant, CSF: NONREACTIVE

## 2022-08-18 DIAGNOSIS — B974 Respiratory syncytial virus as the cause of diseases classified elsewhere: Secondary | ICD-10-CM | POA: Diagnosis not present

## 2022-08-19 LAB — VITAMIN E
Vitamin E (Alpha Tocopherol): 8.5 mg/L — ABNORMAL LOW (ref 9.0–29.0)
Vitamin E(Gamma Tocopherol): 0.9 mg/L (ref 0.5–4.9)

## 2022-08-19 LAB — OLIGOCLONAL BANDS, CSF + SERM

## 2022-08-21 LAB — ZINC: Zinc: 79 ug/dL (ref 44–115)

## 2022-08-21 LAB — COPPER, SERUM: Copper: 93 ug/dL (ref 69–132)

## 2022-08-24 DIAGNOSIS — M5137 Other intervertebral disc degeneration, lumbosacral region: Secondary | ICD-10-CM | POA: Diagnosis not present

## 2022-08-24 DIAGNOSIS — M542 Cervicalgia: Secondary | ICD-10-CM | POA: Diagnosis not present

## 2022-08-24 DIAGNOSIS — M5459 Other low back pain: Secondary | ICD-10-CM | POA: Diagnosis not present

## 2022-08-24 DIAGNOSIS — M9902 Segmental and somatic dysfunction of thoracic region: Secondary | ICD-10-CM | POA: Diagnosis not present

## 2022-08-24 DIAGNOSIS — M791 Myalgia, unspecified site: Secondary | ICD-10-CM | POA: Diagnosis not present

## 2022-08-24 DIAGNOSIS — R293 Abnormal posture: Secondary | ICD-10-CM | POA: Diagnosis not present

## 2022-08-24 DIAGNOSIS — M9903 Segmental and somatic dysfunction of lumbar region: Secondary | ICD-10-CM | POA: Diagnosis not present

## 2022-08-24 DIAGNOSIS — M9901 Segmental and somatic dysfunction of cervical region: Secondary | ICD-10-CM | POA: Diagnosis not present

## 2022-08-24 DIAGNOSIS — M50222 Other cervical disc displacement at C5-C6 level: Secondary | ICD-10-CM | POA: Diagnosis not present

## 2022-08-27 DIAGNOSIS — R7301 Impaired fasting glucose: Secondary | ICD-10-CM | POA: Diagnosis not present

## 2022-08-27 DIAGNOSIS — Z125 Encounter for screening for malignant neoplasm of prostate: Secondary | ICD-10-CM | POA: Diagnosis not present

## 2022-08-27 DIAGNOSIS — I700 Atherosclerosis of aorta: Secondary | ICD-10-CM | POA: Diagnosis not present

## 2022-08-27 DIAGNOSIS — E781 Pure hyperglyceridemia: Secondary | ICD-10-CM | POA: Diagnosis not present

## 2022-08-27 DIAGNOSIS — R748 Abnormal levels of other serum enzymes: Secondary | ICD-10-CM | POA: Diagnosis not present

## 2022-08-28 ENCOUNTER — Ambulatory Visit (HOSPITAL_COMMUNITY)
Admission: RE | Admit: 2022-08-28 | Discharge: 2022-08-28 | Disposition: A | Discharge status: Home / Self Care | Payer: Medicare Other | Source: Ambulatory Visit | Attending: Physician Assistant | Admitting: Physician Assistant

## 2022-08-28 VITALS — BP 118/82 | HR 61 | Ht 70.0 in | Wt 180.2 lb

## 2022-08-28 DIAGNOSIS — I480 Paroxysmal atrial fibrillation: Secondary | ICD-10-CM | POA: Diagnosis not present

## 2022-08-28 DIAGNOSIS — R0609 Other forms of dyspnea: Secondary | ICD-10-CM | POA: Insufficient documentation

## 2022-08-28 DIAGNOSIS — Z7901 Long term (current) use of anticoagulants: Secondary | ICD-10-CM | POA: Insufficient documentation

## 2022-08-28 DIAGNOSIS — I422 Other hypertrophic cardiomyopathy: Secondary | ICD-10-CM | POA: Insufficient documentation

## 2022-08-28 DIAGNOSIS — I484 Atypical atrial flutter: Secondary | ICD-10-CM

## 2022-08-28 DIAGNOSIS — Z79899 Other long term (current) drug therapy: Secondary | ICD-10-CM | POA: Diagnosis not present

## 2022-08-28 DIAGNOSIS — D6869 Other thrombophilia: Secondary | ICD-10-CM | POA: Diagnosis not present

## 2022-08-28 MED ORDER — AMIODARONE HCL 200 MG PO TABS: 200.0000 mg | ORAL_TABLET | Freq: Every day | ORAL | 1 refills | Status: DC

## 2022-08-28 NOTE — H&P (View-Only) (Signed)
Primary Care Physician: Ginger Organ., MD Referring Physician:Dr. Quentin Ore  EP: Dr. Lesia Sago Taiden Joseph Morrow is a 76 y.o. male with a h/o hypertrophic cardiomyopathy, s/p ICD for VT, remote paroxysmal afib, maintaining SR with a very small afib burden on Sotalol. Hospitalized for sotalol administration 02/2017. Chadsvasc score of 2(age, LV dysfunction), continues on eliquis 5 mg bid.  Qtc stable.   He asked to be seen today for afib noted yesterday. Interrogation of ICD/PPM confirms afib on 2/10 and 2/11, mostly starting 2/10 at 11:54 pm and continuing to 5:03 am, 2/11. He states that he drinks alcohol nightly and he often takes his sotalol 14 hours apart. Otherwise, no change from his usual health. Denies snoring. He continues on sotalol.  F/u in afib clinic, 02/14/20. I am seeing him as the device clinic noted onset of atrial flutter, rate controlled since  5/30. In the office he continues in rate controlled aflutter. He is mildly symptomatic with this. He continues on sotalol 120 mg bid, and is compliant. He has 2 alcoholic drinks a week. He had been on BB in the past . Stopped when sotalol loaded. Continues on eliquis 5 mg bid for a CHA2DS2VASc score of 3.   F/u in afib clinic, 03/11/20, as an urgent work-in as he noted increase in HR last night via Fitbit and confirmed with [name]. If not for seeing his HR elevated, he would not have felt  differently out of rhythm. He is on sotalol 120 mg bid and has been on this for several years. He saw Dr. Rayann Heman in June , was doing ok, but it was mentioned in his note, that if afib burden increased, he could consider ablation.  No significant change in health recently. No alcohol, caffeine, tobacco.  By  by the device clinic report this am, he appears to be in an atrial flutter at 117 bpm. BP on arrival 88/70. With a glass of water, BP came up to 104/70. The only medicine he is on is 12.5 metoprolol succinate that would affect BP. This was  added in June on an occasion  when he was out of rhythm. He will usually convert himself.   F/u in afib clinic, 07/18/20. He called the clinic earlier in the week, that he was out of rhythm. He does report that he missed all meds Friday pm and went out of rhythm Saturday pm, including his DOAC. He is in an atrial  Flutter, rate controlled. Reading his ablation OP report, he did  have at least 3 flutter circuits that could not be mapped or ablated. No swallowing or groin issues.  F/u in afib clinic, 11/11. He has returned to [place]. Ekg shows sinus brady at 57 bpm, with first degree AV block.  PR int 236 ms, qrs int 66 ms, qtc 428 ms. He feels well.   F/u 08/02/20. He is now one month out from  ablation and has been in [place] for the last 2 weeks. He feels improved. No issues today.   F/u afib clinic 07/24/21. He went out of rhythm  over the weekend. He has noted more exertional dyspnea and fatigue with this episode. He had vertigo last week with dizziness and N/V with PCP treating with Antivert. It is now better. He has atrial flutter with RVR at 111 bpm.   F/u 08/04/21. He is in the clinic as he went out of rhythm yesterday with RVR at times. He hs noted some H/A at times since  being out of rhythm. He just had a successful cardioversion 11/16 but now with ERAF. His ablation was last October.  Because of hypertrophic cardiomyopathy, his best drug choice would be amiodarone if sotalol was stopped.   F/u 10/21/21 for one month s/p ablation. He is maintaining  SR. He feels well. He had a bruise left thigh but it is resolved . No swallowing issues. Remains on sotalol and metoprolol . No issues with anticoagultion.   F/u in the afib clinic, 04/20/22. He has had ongoing aflutter with controlled rates since 7/15 as notified by the device clinic. He is minimally symptomatic with this. No missed anticoagulation. Only trigger that pt  reports is that he developed vertigo and took several doses of meclizine, he developed  severe constipation after this, now resolved. He is in agreement to  schedule for cardioversion.  F/u cardioversion, 05/04/22. He had a successful cardioversion 8/14 and is now back for f/u. EKG shows sinus brady with first degree block at 56 bpm.  Follow up in the AF clinic 07/08/22. The device clinic received an alert for an ongoing afib episode starting 10/19. He has noted a little more SOB going up stairs and some mild lower extremity edema. There were no specific triggers for his afib that he could identify.   Follow up in the AF clinic 08/28/22. Patient was started on amiodarone 07/08/22 with plan for DCCV after loading. He was admitted 08/13/22 with acute muscle weakness, gait disturbance, and vivid drams/hallucinations. After neurologic workup, this was felt to be steroid induced. He remains in atrial flutter today with symptoms of dyspnea on exertion.   Today, he denies symptoms of  palpitations, chest pain, orthopnea, PND, dizziness, presyncope, syncope, or neurologic sequela.  Positive for palpitations..The patient is tolerating medications without difficulties and is otherwise without complaint today.   Past Medical History:  Diagnosis Date   Acute cholecystitis 06/21/2011   Allergy    SEASONAL   Cataract    Complication of anesthesia    HALLUCINATIONS   ED (erectile dysfunction)    GERD (gastroesophageal reflux disease)    Hearing loss    Helicobacter pylori gastritis 06/2014   Hemorrhoids    Hiatal hernia    Hx of adenomatous colonic polyps 06/2014   Hypertrophic cardiomyopathy (HCC)    Mitral regurgitation    Moderate   Osteopenia    Paroxysmal atrial fibrillation ([place]) 04/2015   documented on ICD remote interrogation, treated with sotalol   Septal defect    LV Septal hyptertrophy 9m, normal EF, minimal LVOT gradiant.   Tubular adenoma of colon    VT (ventricular tachycardia) ([facility] 08/18/2013   s/p St. Jude ICD   Past Surgical History:  Procedure Laterality Date    APPENDECTOMY  1950   ATRIAL FIBRILLATION ABLATION N/A 07/04/2020   Procedure: ATRIAL FIBRILLATION ABLATION;  Surgeon: AThompson Grayer MD;  [facility];  Service: Cardiovascular;  Laterality: N/A;   ATRIAL FIBRILLATION ABLATION N/A 09/18/2021   Procedure: ATRIAL FIBRILLATION ABLATION;  Surgeon: LVickie Epley MD;  [facility];  Service: Cardiovascular;  Laterality: N/A;   CARDIOVERSION N/A 02/25/2017   Procedure: CARDIOVERSION;  Surgeon: RFay Records MD;  [facility];  Service: Cardiovascular;  Laterality: N/A;   CARDIOVERSION N/A 07/30/2021   Procedure: CARDIOVERSION;  Surgeon: BJanina Mayo MD;  [facility] [facility]  Service: Cardiovascular;  Laterality: N/A;   CARDIOVERSION N/A 04/27/2022   Procedure: CARDIOVERSION;  Surgeon: SJerline Pain MD;  Location:  [facility];  Service: Cardiovascular;  Laterality: N/A;   CATARACT EXTRACTION, BILATERAL     CHOLECYSTECTOMY  06/24/2011   COLONOSCOPY     EP IMPLANTABLE DEVICE N/A 09/03/2016   SJM Fortify [address] Dr generator change by Dr Rayann Heman.  ICD implanted for secondary prevention of sudden death   IMPLANTABLE CARDIOVERTER DEFIBRILLATOR IMPLANT  08/18/2013   St. Jude Fortify [address] DR ICD implanted by Dr Rayann Heman for secondary treatment of VT   IMPLANTABLE CARDIOVERTER DEFIBRILLATOR IMPLANT N/A 08/18/2013   Procedure: IMPLANTABLE CARDIOVERTER DEFIBRILLATOR IMPLANT;  Surgeon: Coralyn Mark, MD;  [facility];  Service: Cardiovascular;  Laterality: N/A;   TEE WITHOUT CARDIOVERSION N/A 07/30/2021   Procedure: TRANSESOPHAGEAL ECHOCARDIOGRAM (TEE);  Surgeon: Janina Mayo, MD;  Location: [facility];  Service: Cardiovascular;  Laterality: N/A;    Current Outpatient Medications  Medication Sig Dispense Refill   acetaminophen (TYLENOL) 500 MG tablet Take 500-1,000 mg by mouth every 6 (six) hours as needed (pain.).     amiodarone (PACERONE) 200 MG tablet Take 1 tablet (200 mg total) by  mouth 2 (two) times daily for 30 days, THEN 1 tablet (200 mg total) daily. 120 tablet 0   apixaban (ELIQUIS) 5 MG TABS tablet TAKE 1 TABLET BY MOUTH TWICE A DAY 60 tablet 5   calcium carbonate (OSCAL) 1500 (600 Ca) MG TABS tablet Take 600 mg by mouth in the morning and at bedtime.     Cholecalciferol (VITAMIN D3) 50 MCG (2000 UT) TABS Take 2,000 Units by mouth in the morning and at bedtime.     Coenzyme Q10 (CO Q 10) 100 MG CAPS Take 100 mg by mouth in the morning.     diltiazem (CARDIZEM) 30 MG tablet Cardizem '30mg'$  -- take 1 tablet every 4 hours AS NEEDED for heart rate >100 30 tablet 3   furosemide (LASIX) 20 MG tablet Take 1 tablet by mouth daily until 11/16 then only as needed for swelling/weight gain 30 tablet 11   Glucosamine-Chondroitin (COSAMIN DS PO) Take 1 tablet by mouth in the morning.     metoprolol succinate (TOPROL-XL) 50 MG 24 hr tablet TAKE 1 TABLET TWICE A DAY WITH OR IMMEDIATELY FOLLOWING A MEAL (Patient taking differently: Take 50 mg by mouth 2 (two) times daily.) 180 tablet 3   omeprazole (PRILOSEC OTC) 20 MG tablet Take 10 mg by mouth in the morning.     potassium citrate (UROCIT-K) 10 MEQ (1080 MG) SR tablet Take 1 tablet (10 mEq total) by mouth daily as needed (with lasix (swelling/weight gain)). 30 tablet 11   Probiotic Product (ALIGN PO) Take 1 capsule by mouth in the morning.     Zinc 50 MG CAPS Take 50 mg by mouth 2 (two) times daily.      No current facility-administered medications for this encounter.    Allergies  Allergen Reactions   Prednisone Anxiety    Extreme anxiety, hallucinations   Dextromethorphan Other (See Comments)    nervous   Meclizine     Caused increased heart rate and constipation   Versed [Midazolam] Other (See Comments)    "hallucinations, headache"   Zofran [Ondansetron]     Caused increased heart rate and constipation   Antihistamines, Diphenhydramine-Type Anxiety    Social History   Socioeconomic History   Marital status: Married     Spouse name: Not on file   Number of children: 2   Years of education: Not on file   Highest education level: Not on file  Occupational  History   Occupation: retired  Tobacco Use   Smoking status: Former    Packs/day: 2.00    Years: 20.00    Total pack years: 40.00    Types: Cigarettes    Quit date: 07/06/1981    Years since quitting: 41.1   Smokeless tobacco: Never   Tobacco comments:    Former smoker 10/21/21  Vaping Use   Vaping Use: Never used  Substance and Sexual Activity   Alcohol use: Not Currently    Comment: stop drinking 2 glasses of wine since ablation 10/21/21   Drug use: No   Sexual activity: Not on file  Other Topics Concern   Not on file  Social History Narrative   Originally from [place]. Moved to [place] in 2012. Currently has no pets. No bird or hot tub exposure. Previously worked running [employer]. He also worked as a Air cabin crew. He did have exposure to Beneze, Acetone, Styrene, & Hydrocarbon early in his career.   Social Determinants of Health   Financial Resource Strain: Not on file  Food Insecurity: No Food Insecurity (08/14/2022)   Hunger Vital Sign    Worried About Running Out of Food in the Last Year: Never true    Ran Out of Food in the Last Year: Never true  Transportation Needs: No Transportation Needs (08/14/2022)   PRAPARE - Hydrologist (Medical): No    Lack of Transportation (Non-Medical): No  Physical Activity: Not on file  Stress: Not on file  Social Connections: Not on file  Intimate Partner Violence: Not At Risk (08/14/2022)   Humiliation, Afraid, Rape, and Kick questionnaire    Fear of Current or Ex-Partner: No    Emotionally Abused: No    Physically Abused: No    Sexually Abused: No    Family History  Problem Relation Age of Onset   Hypertrophic cardiomyopathy Mother    Arrhythmia Mother    Heart failure Mother    Pneumonia Father    Diabetes Father    Colon polyps Father    Heart  attack Brother    Sleep apnea Brother    Hypertrophic cardiomyopathy Brother    Heart Problems Brother        VAD   Arrhythmia Brother    Heart Problems Brother    Diabetes Brother    Sudden death Brother    Lung disease Neg Hx     ROS- All systems are reviewed and negative except as per the HPI above  Physical Exam: Vitals:   08/28/22 0850  Weight: 81.7 kg  Height: '5\' 10"'$  (1.778 m)    Wt Readings from Last 3 Encounters:  08/28/22 81.7 kg  08/13/22 83.4 kg  07/22/22 83.4 kg    Labs: Lab Results  Component Value Date   NA 138 08/15/2022   K 3.8 08/15/2022   CL 104 08/15/2022   CO2 25 08/15/2022   GLUCOSE 92 08/15/2022   BUN 14 08/15/2022   CREATININE 1.28 (H) 08/15/2022   CALCIUM 8.4 (L) 08/15/2022   MG 1.8 04/20/2022   Lab Results  Component Value Date   INR 1.5 (H) 08/14/2022   No results found for: "CHOL", "HDL", "LDLCALC", "TRIG"   GEN- The patient is a well appearing elderly male, alert and oriented x 3 today.   HEENT-head normocephalic, atraumatic, sclera clear, conjunctiva pink, hearing intact, trachea midline. Lungs- Clear to ausculation bilaterally, normal work of breathing Heart- irregular rate and rhythm, no murmurs, rubs or gallops  GI- soft, NT, ND, + BS Extremities- no clubbing, cyanosis, or edema MS- no significant deformity or atrophy Skin- no rash or lesion Psych- euthymic mood, full affect Neuro- strength and sensation are intact   ECG today demonstrates  V pacing with underlying atrial flutter Vent. rate 61 BPM PR interval * ms QRS duration 164 ms QT/QTcB 502/505 ms   Echo 08/23/19  1. Hypertrophic cardiomyopathy, sigmoid subtype. Basal septum measures 18 mm in maximal dimension. Flow acceleration noted at the LVOT, no LVOT obstruction noted. Chordal systolic anterior motion of the mitral valve without involvment of the anterior leaflet.   2. Left ventricular ejection fraction, by visual estimation, is 60 to  65%. The left  ventricle has normal function. There is severely increased  left ventricular hypertrophy.   3. Elevated left atrial and left ventricular end-diastolic pressures.   4. Left ventricular diastolic parameters are consistent with Grade III  diastolic dysfunction (restrictive).   5. Global right ventricle has normal systolic function.The right  ventricular size is normal. No increase in right ventricular wall  thickness.   6. Left atrial size was moderately dilated.   7. Right atrial size was moderately dilated.   8. The mitral valve is normal in structure. Mild mitral valve  regurgitation. No evidence of mitral stenosis.   9. The tricuspid valve is normal in structure. Tricuspid valve  regurgitation is mild.  10. The aortic valve is tricuspid. Aortic valve regurgitation is not  visualized. No evidence of aortic valve sclerosis or stenosis.  11. The pulmonic valve was normal in structure. Pulmonic valve  regurgitation is mild.  12. Moderately elevated pulmonary artery systolic pressure.  13. The inferior vena cava is dilated in size with <50% respiratory  variability, suggesting right atrial pressure of 15 mmHg.  14. The left ventricle has no regional wall motion abnormalities.     Assessment and Plan: 1. Atrial flutter  S/p ablation 06/2020 with redo ablation 09/2021   S/p DCCV 04/2022 with quick return of atrial flutter. Will plan for DCCV now that he has loaded on amiodarone. Will make sure he is at least 3 weeks out from his hospital discharge since his Eliquis was held for a LP.  Continue amiodarone 200 mg daily Continue Toprol 50 mg BID  Continue Eliquis 5 mg bid for a CHA2DS2VASc score of at least 3   2. HCM St Jude -Merlin ICD, followed by Dr Quentin Ore and the device clinic.   Follow up in the AF clinic 1-2 weeks post DCCV.    [facility] [address] [place], [place] [address] [phone]

## 2022-08-28 NOTE — Patient Instructions (Signed)
Cardioversion scheduled for: Tuesday, January 2nd  Come to afib clinic for labs at [place] at the [facility] and go to admitting at 1130am   - Do not eat or drink anything after midnight the night prior to your procedure.   - Take all your morning medication (except diabetic medications) with a sip of water prior to arrival. - You will not be able to drive home after your procedure.    - Do NOT miss any doses of your blood thinner - if you should miss a dose please notify our office immediately.   - If you feel as if you go back into normal rhythm prior to scheduled cardioversion, please notify our office immediately.  If your procedure is canceled in the cardioversion suite you will be charged a cancellation fee.

## 2022-08-28 NOTE — Progress Notes (Signed)
Primary Care Physician: Ginger Organ., MD Referring Physician:Dr. Quentin Ore  EP: Dr. Lesia Sago Joseph Morrow is a 76 y.o. male with a h/o hypertrophic cardiomyopathy, s/p ICD for VT, remote paroxysmal afib, maintaining SR with a very small afib burden on Sotalol. Hospitalized for sotalol administration 02/2017. Chadsvasc score of 2(age, LV dysfunction), continues on eliquis 5 mg bid.  Qtc stable.   He asked to be seen today for afib noted yesterday. Interrogation of ICD/PPM confirms afib on 2/10 and 2/11, mostly starting 2/10 at 11:54 pm and continuing to 5:03 am, 2/11. He states that he drinks alcohol nightly and he often takes his sotalol 14 hours apart. Otherwise, no change from his usual health. Denies snoring. He continues on sotalol.  F/u in afib clinic, 02/14/20. I am seeing him as the device clinic noted onset of atrial flutter, rate controlled since  5/30. In the office he continues in rate controlled aflutter. He is mildly symptomatic with this. He continues on sotalol 120 mg bid, and is compliant. He has 2 alcoholic drinks a week. He had been on BB in the past . Stopped when sotalol loaded. Continues on eliquis 5 mg bid for a CHA2DS2VASc score of 3.   F/u in afib clinic, 03/11/20, as an urgent work-in as he noted increase in HR last night via Fitbit and confirmed with [name]. If not for seeing his HR elevated, he would not have felt  differently out of rhythm. He is on sotalol 120 mg bid and has been on this for several years. He saw Dr. Rayann Heman in June , was doing ok, but it was mentioned in his note, that if afib burden increased, he could consider ablation.  No significant change in health recently. No alcohol, caffeine, tobacco.  By  by the device clinic report this am, he appears to be in an atrial flutter at 117 bpm. BP on arrival 88/70. With a glass of water, BP came up to 104/70. The only medicine he is on is 12.5 metoprolol succinate that would affect BP. This was  added in June on an occasion  when he was out of rhythm. He will usually convert himself.   F/u in afib clinic, 07/18/20. He called the clinic earlier in the week, that he was out of rhythm. He does report that he missed all meds Friday pm and went out of rhythm Saturday pm, including his DOAC. He is in an atrial  Flutter, rate controlled. Reading his ablation OP report, he did  have at least 3 flutter circuits that could not be mapped or ablated. No swallowing or groin issues.  F/u in afib clinic, 11/11. He has returned to [place]. Ekg shows sinus brady at 57 bpm, with first degree AV block.  PR int 236 ms, qrs int 66 ms, qtc 428 ms. He feels well.   F/u 08/02/20. He is now one month out from  ablation and has been in [place] for the last 2 weeks. He feels improved. No issues today.   F/u afib clinic 07/24/21. He went out of rhythm  over the weekend. He has noted more exertional dyspnea and fatigue with this episode. He had vertigo last week with dizziness and N/V with PCP treating with Antivert. It is now better. He has atrial flutter with RVR at 111 bpm.   F/u 08/04/21. He is in the clinic as he went out of rhythm yesterday with RVR at times. He hs noted some H/A at times since  being out of rhythm. He just had a successful cardioversion 11/16 but now with ERAF. His ablation was last October.  Because of hypertrophic cardiomyopathy, his best drug choice would be amiodarone if sotalol was stopped.   F/u 10/21/21 for one month s/p ablation. He is maintaining  SR. He feels well. He had a bruise left thigh but it is resolved . No swallowing issues. Remains on sotalol and metoprolol . No issues with anticoagultion.   F/u in the afib clinic, 04/20/22. He has had ongoing aflutter with controlled rates since 7/15 as notified by the device clinic. He is minimally symptomatic with this. No missed anticoagulation. Only trigger that pt  reports is that he developed vertigo and took several doses of meclizine, he developed  severe constipation after this, now resolved. He is in agreement to  schedule for cardioversion.  F/u cardioversion, 05/04/22. He had a successful cardioversion 8/14 and is now back for f/u. EKG shows sinus brady with first degree block at 56 bpm.  Follow up in the AF clinic 07/08/22. The device clinic received an alert for an ongoing afib episode starting 10/19. He has noted a little more SOB going up stairs and some mild lower extremity edema. There were no specific triggers for his afib that he could identify.   Follow up in the AF clinic 08/28/22. Patient was started on amiodarone 07/08/22 with plan for DCCV after loading. He was admitted 08/13/22 with acute muscle weakness, gait disturbance, and vivid drams/hallucinations. After neurologic workup, this was felt to be steroid induced. He remains in atrial flutter today with symptoms of dyspnea on exertion.   Today, he denies symptoms of  palpitations, chest pain, orthopnea, PND, dizziness, presyncope, syncope, or neurologic sequela.  Positive for palpitations..The patient is tolerating medications without difficulties and is otherwise without complaint today.   Past Medical History:  Diagnosis Date   Acute cholecystitis 06/21/2011   Allergy    SEASONAL   Cataract    Complication of anesthesia    HALLUCINATIONS   ED (erectile dysfunction)    GERD (gastroesophageal reflux disease)    Hearing loss    Helicobacter pylori gastritis 06/2014   Hemorrhoids    Hiatal hernia    Hx of adenomatous colonic polyps 06/2014   Hypertrophic cardiomyopathy (HCC)    Mitral regurgitation    Moderate   Osteopenia    Paroxysmal atrial fibrillation ([place]) 04/2015   documented on ICD remote interrogation, treated with sotalol   Septal defect    LV Septal hyptertrophy 61m, normal EF, minimal LVOT gradiant.   Tubular adenoma of colon    VT (ventricular tachycardia) ([facility] 08/18/2013   s/p St. Jude ICD   Past Surgical History:  Procedure Laterality Date    APPENDECTOMY  1950   ATRIAL FIBRILLATION ABLATION N/A 07/04/2020   Procedure: ATRIAL FIBRILLATION ABLATION;  Surgeon: AThompson Grayer MD;  [facility];  Service: Cardiovascular;  Laterality: N/A;   ATRIAL FIBRILLATION ABLATION N/A 09/18/2021   Procedure: ATRIAL FIBRILLATION ABLATION;  Surgeon: LVickie Epley MD;  [facility];  Service: Cardiovascular;  Laterality: N/A;   CARDIOVERSION N/A 02/25/2017   Procedure: CARDIOVERSION;  Surgeon: RFay Records MD;  [facility];  Service: Cardiovascular;  Laterality: N/A;   CARDIOVERSION N/A 07/30/2021   Procedure: CARDIOVERSION;  Surgeon: BJanina Mayo MD;  [place] [facility]  Service: Cardiovascular;  Laterality: N/A;   CARDIOVERSION N/A 04/27/2022   Procedure: CARDIOVERSION;  Surgeon: SJerline Pain MD;  Location:  [facility]OS[facility];  Service: Cardiovascular;  Laterality: N/A;   CATARACT EXTRACTION, BILATERAL     CHOLECYSTECTOMY  06/24/2011   COLONOSCOPY     EP IMPLANTABLE DEVICE N/A 09/03/2016   SJM Fortify [address] Dr generator change by Dr Rayann Heman.  ICD implanted for secondary prevention of sudden death   IMPLANTABLE CARDIOVERTER DEFIBRILLATOR IMPLANT  08/18/2013   St. Jude Fortify [address] DR ICD implanted by Dr Rayann Heman for secondary treatment of VT   IMPLANTABLE CARDIOVERTER DEFIBRILLATOR IMPLANT N/A 08/18/2013   Procedure: IMPLANTABLE CARDIOVERTER DEFIBRILLATOR IMPLANT;  Surgeon: Coralyn Mark, MD;  Location[facility] CATH LAB;  Service: Cardiovascular;  Laterality: N/A;   TEE WITHOUT CARDIOVERSION N/A 07/30/2021   Procedure: TRANSESOPHAGEAL ECHOCARDIOGRAM (TEE);  Surgeon: Janina Mayo, MD;  Location: [facility] ENDOSCOPY;  Service: Cardiovascular;  Laterality: N/A;    Current Outpatient Medications  Medication Sig Dispense Refill   acetaminophen (TYLENOL) 500 MG tablet Take 500-1,000 mg by mouth every 6 (six) hours as needed (pain.).     amiodarone (PACERONE) 200 MG tablet Take 1 tablet (200 mg total) by  mouth 2 (two) times daily for 30 days, THEN 1 tablet (200 mg total) daily. 120 tablet 0   apixaban (ELIQUIS) 5 MG TABS tablet TAKE 1 TABLET BY MOUTH TWICE A DAY 60 tablet 5   calcium carbonate (OSCAL) 1500 (600 Ca) MG TABS tablet Take 600 mg by mouth in the morning and at bedtime.     Cholecalciferol (VITAMIN D3) 50 MCG (2000 UT) TABS Take 2,000 Units by mouth in the morning and at bedtime.     Coenzyme Q10 (CO Q 10) 100 MG CAPS Take 100 mg by mouth in the morning.     diltiazem (CARDIZEM) 30 MG tablet Cardizem '30mg'$  -- take 1 tablet every 4 hours AS NEEDED for heart rate >100 30 tablet 3   furosemide (LASIX) 20 MG tablet Take 1 tablet by mouth daily until 11/16 then only as needed for swelling/weight gain 30 tablet 11   Glucosamine-Chondroitin (COSAMIN DS PO) Take 1 tablet by mouth in the morning.     metoprolol succinate (TOPROL-XL) 50 MG 24 hr tablet TAKE 1 TABLET TWICE A DAY WITH OR IMMEDIATELY FOLLOWING A MEAL (Patient taking differently: Take 50 mg by mouth 2 (two) times daily.) 180 tablet 3   omeprazole (PRILOSEC OTC) 20 MG tablet Take 10 mg by mouth in the morning.     potassium citrate (UROCIT-K) 10 MEQ (1080 MG) SR tablet Take 1 tablet (10 mEq total) by mouth daily as needed (with lasix (swelling/weight gain)). 30 tablet 11   Probiotic Product (ALIGN PO) Take 1 capsule by mouth in the morning.     Zinc 50 MG CAPS Take 50 mg by mouth 2 (two) times daily.      No current facility-administered medications for this encounter.    Allergies  Allergen Reactions   Prednisone Anxiety    Extreme anxiety, hallucinations   Dextromethorphan Other (See Comments)    nervous   Meclizine     Caused increased heart rate and constipation   Versed [Midazolam] Other (See Comments)    "hallucinations, headache"   Zofran [Ondansetron]     Caused increased heart rate and constipation   Antihistamines, Diphenhydramine-Type Anxiety    Social History   Socioeconomic History   Marital status: Married     Spouse name: Not on file   Number of children: 2   Years of education: Not on file   Highest education level: Not on file  Occupational  History   Occupation: retired  Tobacco Use   Smoking status: Former    Packs/day: 2.00    Years: 20.00    Total pack years: 40.00    Types: Cigarettes    Quit date: 07/06/1981    Years since quitting: 41.1   Smokeless tobacco: Never   Tobacco comments:    Former smoker 10/21/21  Vaping Use   Vaping Use: Never used  Substance and Sexual Activity   Alcohol use: Not Currently    Comment: stop drinking 2 glasses of wine since ablation 10/21/21   Drug use: No   Sexual activity: Not on file  Other Topics Concern   Not on file  Social History Narrative   Originally from [place]. Moved to [place] in 2012. Currently has no pets. No bird or hot tub exposure. Previously worked running [employer]. He also worked as a Air cabin crew. He did have exposure to Beneze, Acetone, Styrene, & Hydrocarbon early in his career.   Social Determinants of Health   Financial Resource Strain: Not on file  Food Insecurity: No Food Insecurity (08/14/2022)   Hunger Vital Sign    Worried About Running Out of Food in the Last Year: Never true    Ran Out of Food in the Last Year: Never true  Transportation Needs: No Transportation Needs (08/14/2022)   PRAPARE - Hydrologist (Medical): No    Lack of Transportation (Non-Medical): No  Physical Activity: Not on file  Stress: Not on file  Social Connections: Not on file  Intimate Partner Violence: Not At Risk (08/14/2022)   Humiliation, Afraid, Rape, and Kick questionnaire    Fear of Current or Ex-Partner: No    Emotionally Abused: No    Physically Abused: No    Sexually Abused: No    Family History  Problem Relation Age of Onset   Hypertrophic cardiomyopathy Mother    Arrhythmia Mother    Heart failure Mother    Pneumonia Father    Diabetes Father    Colon polyps Father    Heart  attack Brother    Sleep apnea Brother    Hypertrophic cardiomyopathy Brother    Heart Problems Brother        VAD   Arrhythmia Brother    Heart Problems Brother    Diabetes Brother    Sudden death Brother    Lung disease Neg Hx     ROS- All systems are reviewed and negative except as per the HPI above  Physical Exam: Vitals:   08/28/22 0850  Weight: 81.7 kg  Height: '5\' 10"'$  (1.778 m)    Wt Readings from Last 3 Encounters:  08/28/22 81.7 kg  08/13/22 83.4 kg  07/22/22 83.4 kg    Labs: Lab Results  Component Value Date   NA 138 08/15/2022   K 3.8 08/15/2022   CL 104 08/15/2022   CO2 25 08/15/2022   GLUCOSE 92 08/15/2022   BUN 14 08/15/2022   CREATININE 1.28 (H) 08/15/2022   CALCIUM 8.4 (L) 08/15/2022   MG 1.8 04/20/2022   Lab Results  Component Value Date   INR 1.5 (H) 08/14/2022   No results found for: "CHOL", "HDL", "LDLCALC", "TRIG"   GEN- The patient is a well appearing elderly male, alert and oriented x 3 today.   HEENT-head normocephalic, atraumatic, sclera clear, conjunctiva pink, hearing intact, trachea midline. Lungs- Clear to ausculation bilaterally, normal work of breathing Heart- irregular rate and rhythm, no murmurs, rubs or gallops  GI- soft, NT, ND, + BS Extremities- no clubbing, cyanosis, or edema MS- no significant deformity or atrophy Skin- no rash or lesion Psych- euthymic mood, full affect Neuro- strength and sensation are intact   ECG today demonstrates  V pacing with underlying atrial flutter Vent. rate 61 BPM PR interval * ms QRS duration 164 ms QT/QTcB 502/505 ms   Echo 08/23/19  1. Hypertrophic cardiomyopathy, sigmoid subtype. Basal septum measures 18 mm in maximal dimension. Flow acceleration noted at the LVOT, no LVOT obstruction noted. Chordal systolic anterior motion of the mitral valve without involvment of the anterior leaflet.   2. Left ventricular ejection fraction, by visual estimation, is 60 to  65%. The left  ventricle has normal function. There is severely increased  left ventricular hypertrophy.   3. Elevated left atrial and left ventricular end-diastolic pressures.   4. Left ventricular diastolic parameters are consistent with Grade III  diastolic dysfunction (restrictive).   5. Global right ventricle has normal systolic function.The right  ventricular size is normal. No increase in right ventricular wall  thickness.   6. Left atrial size was moderately dilated.   7. Right atrial size was moderately dilated.   8. The mitral valve is normal in structure. Mild mitral valve  regurgitation. No evidence of mitral stenosis.   9. The tricuspid valve is normal in structure. Tricuspid valve  regurgitation is mild.  10. The aortic valve is tricuspid. Aortic valve regurgitation is not  visualized. No evidence of aortic valve sclerosis or stenosis.  11. The pulmonic valve was normal in structure. Pulmonic valve  regurgitation is mild.  12. Moderately elevated pulmonary artery systolic pressure.  13. The inferior vena cava is dilated in size with <50% respiratory  variability, suggesting right atrial pressure of 15 mmHg.  14. The left ventricle has no regional wall motion abnormalities.     Assessment and Plan: 1. Atrial flutter  S/p ablation 06/2020 with redo ablation 09/2021   S/p DCCV 04/2022 with quick return of atrial flutter. Will plan for DCCV now that he has loaded on amiodarone. Will make sure he is at least 3 weeks out from his hospital discharge since his Eliquis was held for a LP.  Continue amiodarone 200 mg daily Continue Toprol 50 mg BID  Continue Eliquis 5 mg bid for a CHA2DS2VASc score of at least 3   2. HCM St Jude -Merlin ICD, followed by Dr Quentin Ore and the device clinic.   Follow up in the AF clinic 1-2 weeks post DCCV.    [facility] [address] [place], [place] [address] [phone]

## 2022-08-31 ENCOUNTER — Ambulatory Visit: Payer: Medicare Other | Attending: Family Medicine | Admitting: Physical Therapy

## 2022-08-31 ENCOUNTER — Encounter: Payer: Self-pay | Admitting: Physical Therapy

## 2022-08-31 ENCOUNTER — Other Ambulatory Visit: Payer: Self-pay

## 2022-08-31 VITALS — BP 113/75 | HR 63

## 2022-08-31 DIAGNOSIS — R2681 Unsteadiness on feet: Secondary | ICD-10-CM

## 2022-08-31 DIAGNOSIS — R270 Ataxia, unspecified: Secondary | ICD-10-CM | POA: Insufficient documentation

## 2022-08-31 DIAGNOSIS — R269 Unspecified abnormalities of gait and mobility: Secondary | ICD-10-CM | POA: Diagnosis not present

## 2022-08-31 NOTE — Addendum Note (Signed)
Addended by: Malachi Carl A on: 08/31/2022 01:10 PM   Modules accepted: Orders

## 2022-08-31 NOTE — Therapy (Addendum)
OUTPATIENT PHYSICAL THERAPY NEURO EVALUATION   Patient Name: Joseph Morrow MRN: [id number] DOB:1946/04/13, 76 y.o., male Today's Date: 08/31/2022   PCP: PCP: Joseph Organ., Morrow REFERRING PROVIDER: Edwin Morrow  END OF SESSION:  PT End of Session - 08/31/22 1105     Visit Number 1    Number of Visits 5    Date for PT Re-Evaluation 10/12/22    Authorization Type Medicare & BCBS    Progress Note Due on Visit 10    PT Start Time 1105    PT Stop Time 1147    PT Time Calculation (min) 42 min    Equipment Utilized During Treatment Gait belt    Activity Tolerance Patient tolerated treatment well    Behavior During Therapy WFL for tasks assessed/performed             Past Medical History:  Diagnosis Date   Acute cholecystitis 06/21/2011   Allergy    SEASONAL   Cataract    Complication of anesthesia    HALLUCINATIONS   ED (erectile dysfunction)    GERD (gastroesophageal reflux disease)    Hearing loss    Helicobacter pylori gastritis 06/2014   Hemorrhoids    Hiatal hernia    Hx of adenomatous colonic polyps 06/2014   Hypertrophic cardiomyopathy (HCC)    Mitral regurgitation    Moderate   Osteopenia    Paroxysmal atrial fibrillation ([place]) 04/2015   documented on ICD remote interrogation, treated with sotalol   Septal defect    LV Septal hyptertrophy 52m, normal EF, minimal LVOT gradiant.   Tubular adenoma of colon    [place] 08/18/2013   s/p St. Jude ICD   Past Surgical History:  Procedure Laterality Date   APPENDECTOMY  1950   ATRIAL FIBRILLATION ABLATION N/A 07/04/2020   Procedure: ATRIAL FIBRILLATION ABLATION;  Surgeon: AThompson Grayer Morrow;  [facility];  Service: Cardiovascular;  Laterality: N/A;   ATRIAL FIBRILLATION ABLATION N/A 09/18/2021   Procedure: ATRIAL FIBRILLATION ABLATION;  Surgeon: LVickie Epley Morrow;  [facility];  Service: Cardiovascular;  Laterality:  N/A;   CARDIOVERSION N/A 02/25/2017   Procedure: CARDIOVERSION;  Surgeon: RFay Records Morrow;  [facility];  Service: Cardiovascular;  Laterality: N/A;   CARDIOVERSION N/A 07/30/2021   Procedure: CARDIOVERSION;  Surgeon: BJanina Mayo Morrow;  [place] [facility]  Service: Cardiovascular;  Laterality: N/A;   CARDIOVERSION N/A 04/27/2022   Procedure: CARDIOVERSION;  Surgeon: SJerline Pain Morrow;  [place] [facility]  Service: Cardiovascular;  Laterality: N/A;   CATARACT EXTRACTION, BILATERAL     CHOLECYSTECTOMY  06/24/2011   COLONOSCOPY     EP IMPLANTABLE DEVICE N/A 09/03/2016   SJM Fortify [address]ura Dr generator change by Dr Joseph Morrow  ICD implanted for secondary prevention of sudden death   IMPLANTABLE CARDIOVERTER DEFIBRILLATOR IMPLANT  08/18/2013   St. Jude Fortify Assura DR ICD implanted by Dr Joseph Morrow secondary treatment of Morrow   IMPLANTABLE CARDIOVERTER DEFIBRILLATOR IMPLANT N/A 08/18/2013   Procedure: IMPLANTABLE CARDIOVERTER DEFIBRILLATOR IMPLANT;  Surgeon: JCoralyn Mark Morrow;  [facility];  Service: Cardiovascular;  Laterality: N/A;   TEE WITHOUT CARDIOVERSION N/A 07/30/2021   Procedure: TRANSESOPHAGEAL ECHOCARDIOGRAM (TEE);  Surgeon: BJanina Mayo Morrow;  [facility];  Service: Cardiovascular;  Laterality: N/A;   Patient Active Problem List   Diagnosis Date Noted   Transaminitis 08/14/2022   Ataxia 08/14/2022   RSV (respiratory syncytial virus pneumonia) 08/13/2022  Gait disturbance 08/13/2022   Secondary hypercoagulable state ([place]) 07/08/2022   Atypical atrial flutter (HCC)    Typical atrial flutter ([place]) 02/23/2017   Rupture of distal biceps tendon, left, initial encounter 12/23/2016   Multiple lung nodules on CT 03/20/2016   Cough 03/10/2016   Nausea 11/22/2015   PAF (paroxysmal atrial fibrillation) ([place]) 08/20/2015   Hx of colonic polyps 05/14/2014   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/04/2013   Morrow (ventricular  tachycardia) ([place]) 09/04/2013   V tach ([place]) 08/18/2013   Hypertrophic cardiomyopathy ([place])    Heart murmur    Mitral regurgitation    GERD (gastroesophageal reflux disease)     ONSET DATE: 08/15/2022 (date of referral)  REFERRING DIAG: R26 Gait abnormality   THERAPY DIAG:  Abnormality of gait and mobility - Plan: PT plan of care cert/re-cert  Unsteadiness on feet - Plan: PT plan of care cert/re-cert  Rationale for Evaluation and Treatment: Rehabilitation  SUBJECTIVE:                                                                                                                                                                                             SUBJECTIVE STATEMENT: Patient was given a steriod for RSV that caused an allergic reaction at the end of November that caused abonormal movements. Patient reports that per his wife he fell down and had some abnormal movements but doesn't remember any of it. Patient reports that he has a history of balance challenges prior to onset of symptoms. This is why he believes he is here for physical therapy today as the previous symptoms have since resolved. Patient reports that he feels like he looses his balance without warning. He feels extremely off balance when walking in his yard on slope. Patient wears hearing aids bilaterally, denies numbness and tingling in feet. Patient also reports that he was seen at this facility of previously for his balance over a year ago for vertigo. He denies vertigo and dizziness since. Patient reports history of double vision and has corrective lenses; he has had double vision since cataract surgery. Notices increase in vision symptoms with fatigue.   Pt accompanied by: self  PERTINENT HISTORY:  Per 08/13/2022 Hospital note: "Mr. Joseph Morrow is a 76 y.o. 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 In the ER, he also  described symptoms of ataxia, generalized weakness, and nightmares, and so Neurology were consulted.Encephalopathy ruled out.  MRI brain, cervical and lumbar spine without contrast was motion degraded but otherwise within normal limits." Patient symptoms  resolved within 30 hours of steroid ending per notes  Per chart Progress note on 08/15/2022: "Patient was "positive for RSV. He was previously prescribed antibiotics and steroids for his respiratory infection. He saw his PCP on 11/27 and took the steroids and antibiotics for 2 days (stopped 11/29) due to nightmares, anxiety, and abnormal gait.... Patient/wife initially reported last dose of steroid sever days prior to admission to attending Morrow, but this has been clarified and confirmed to have been taken day of presentation last."   Atypical atrial flutter Cardioversion scheduled for 09/15/2022  Morrow:  Are you having Morrow? No  PRECAUTIONS: Fall; mask for RSV (donned throughout session)  WEIGHT BEARING RESTRICTIONS: No  FALLS: Has patient fallen in last 6 months? Yes, Reports x1 with reaction to steroid but denies injuries, reports multiple near falls within the last 6 months, cannot recall specific amounts, uses UE to steady.   LIVING ENVIRONMENT: Lives with: lives with their spouse Lives in: House/apartment Stairs:  2 steps in the garage- currently no railing but plans to add soon; and about 14 steps into attic with railing Has following equipment at home: None  PLOF: Independent  PATIENT GOALS: Patient reports, "I would like to stand up straight." "Improve balance."  OBJECTIVE:   Today's Vitals   08/31/22 1125  BP: 113/75  Pulse: 63   There is no height or weight on file to calculate BMI.   DIAGNOSTIC FINDINGS:  Brain MRI on 11/30: IMPRESSION: 1. No acute intracranial abnormality. 2. Mild cerebral atrophy.  COGNITION: Overall cognitive status: Within functional limits for tasks assessed   SENSATION: Light touch: reports  feeling slightly more R > L   POSTURE: forward head and mild thoracic kyphosis  LOWER EXTREMITY MMT:    MMT Right Eval Left Eval  Hip flexion 4/5 4/5  Hip extension    Hip abduction    Hip adduction    Hip internal rotation    Hip external rotation    Knee flexion 4/5 4/5  Knee extension 4/5 4/5  Ankle dorsiflexion 4/5 4/5  Ankle plantarflexion    Ankle inversion    Ankle eversion    (Blank rows = not tested)  TRANSFERS: Assistive device utilized: None  Sit to stand: Complete Independence Stand to sit: Complete Independence Chair to chair: Complete Independence Floor: Complete Independence   STAIRS: Level of Assistance: Complete Independence Stair Negotiation Technique: Alternating Pattern  with Single Rail on Left Number of Stairs: 4  Height of Stairs: 6"   GAIT: Gait pattern: Slight decrease in step length bilaterally Assistive device utilized: None Level of assistance: Complete Independence Comments: Increased lateral deviation noted with head turns  FUNCTIONAL TESTS:  5 times sit to stand: 12.3 seconds  without use of UE, does use UE unless cued   [facility] PT Assessment - 08/31/22 0001       Functional Gait  Assessment   Gait assessed  Yes    Gait Level Surface Walks 20 ft in less than 7 sec but greater than 5.5 sec, uses assistive device, slower speed, mild gait deviations, or deviates 6-10 in outside of the 12 in walkway width.    Change in Gait Speed Able to smoothly change walking speed without loss of balance or gait deviation. Deviate no more than 6 in outside of the 12 in walkway width.    Gait with Horizontal Head Turns Performs head turns smoothly with slight change in gait velocity (eg, minor disruption to smooth gait path), deviates 6-10 in outside 12  in walkway width, or uses an assistive device.    Gait with Vertical Head Turns Performs task with slight change in gait velocity (eg, minor disruption to smooth gait path), deviates 6 - 10 in outside 12  in walkway width or uses assistive device    Gait and Pivot Turn Pivot turns safely within 3 sec and stops quickly with no loss of balance.    Step Over Obstacle Is able to step over one shoe box (4.5 in total height) without changing gait speed. No evidence of imbalance.    Gait with Narrow Base of Support Ambulates less than 4 steps heel to toe or cannot perform without assistance.    Gait with Eyes Closed Walks 20 ft, uses assistive device, slower speed, mild gait deviations, deviates 6-10 in outside 12 in walkway width. Ambulates 20 ft in less than 9 sec but greater than 7 sec.    Ambulating Backwards Walks 20 ft, uses assistive device, slower speed, mild gait deviations, deviates 6-10 in outside 12 in walkway width.    Steps Alternating feet, must use rail.    Total Score 20           Reported some dizziness and imbalance with vertical head movements.   PATIENT SURVEYS:  ABC scale 95% confidence  TODAY'S TREATMENT:                                                                                                                               Initial eval - treatment not performed today   PATIENT EDUCATION: Education details: Education/interpretation on eval results and outcomes; patient verbalized understanding; collaborated on goals Person educated: Patient Education method: Explanation Education comprehension: verbalized understanding and needs further education  HOME EXERCISE PROGRAM: To be provided  GOALS: Goals reviewed with patient? Yes  SHORT TERM GOALS: Target date: 09/14/2022  Patient will demonstrate 100% compliance with initial HEP to continue to progress between physical therapy sessions.   Baseline: To be provided Goal status: INITIAL  2.  Patient will demonstrate forward gait with head turns up and down without reports of feeling off balance and without gait deviations to improve overall sense of balance (FGA score for this component 3/3) Baseline: Initial: 2/3  on FGA Goal status: INITIAL  3.  SOT goal to be assessed Baseline: To be assessed Goal status: INITIAL   LONG TERM GOALS: Target date: 10/12/2022   Patient will report demonstrate independence with final HEP in order to maintain current gains and continue to progress after physical therapy discharge.   Baseline: To be provided Goal status: INITIAL  2.  Patient will improve FGA to greater than 22/30 to indicate a decreased risk of falls and improved dynamic stability.   Baseline: 20/30 Goal status: INITIAL  3.  SOT goal to be assessed Baseline: To be assessed Goal status: INITIAL   ASSESSMENT:  CLINICAL IMPRESSION: Patient is a 76 y.o. male who was seen today for physical therapy  evaluation and treatment for abnormal gait and imbalance. Patient had an allergic reaction to a steriod provided by primary care to treat RSV at end of November, which resulted in temporary steroid induced gait abnormalities that have since resolved. Patient continues to report underlying gait and balance deficits that make him feel unsafe performing higher level yard work. Patient was found to be at an increased risk for falls as indicated by FGA score of 20/30. Patient also reports a feeling of dizziness or imbalance with head turns looking up towards ceiling during assessment and will benefit from futher vestibular assessment in next session. Patient also presents with deficits in gait, posture, and LE strength. Patient will benefit from skilled physical therapy to address impairments and establish a good home program to progress towards discharge.   OBJECTIVE IMPAIRMENTS: Abnormal gait, decreased balance, and decreased strength.   ACTIVITY LIMITATIONS: bending  PARTICIPATION LIMITATIONS: yard work  PERSONAL FACTORS: Age and Past/current experiences are also affecting patient's functional outcome.   REHAB POTENTIAL: Excellent  CLINICAL DECISION MAKING: Stable/uncomplicated  EVALUATION COMPLEXITY:  Low  PLAN:  PT FREQUENCY: 1x/week  PT DURATION: 6 weeks  PLANNED INTERVENTIONS: Therapeutic exercises, Therapeutic activity, Neuromuscular re-education, Balance training, Gait training, Patient/Family education, Vestibular training, and Canalith repositioning  PLAN FOR NEXT SESSION: Vestibular assessment, SOT and add goal, high level dynamic balance tasks  Malachi Carl, PT, DPT  08/31/2022, 12:54 PM

## 2022-09-01 DIAGNOSIS — Z1331 Encounter for screening for depression: Secondary | ICD-10-CM | POA: Diagnosis not present

## 2022-09-01 DIAGNOSIS — I700 Atherosclerosis of aorta: Secondary | ICD-10-CM | POA: Diagnosis not present

## 2022-09-01 DIAGNOSIS — D72819 Decreased white blood cell count, unspecified: Secondary | ICD-10-CM | POA: Diagnosis not present

## 2022-09-01 DIAGNOSIS — I421 Obstructive hypertrophic cardiomyopathy: Secondary | ICD-10-CM | POA: Diagnosis not present

## 2022-09-01 DIAGNOSIS — R7401 Elevation of levels of liver transaminase levels: Secondary | ICD-10-CM | POA: Diagnosis not present

## 2022-09-01 DIAGNOSIS — I480 Paroxysmal atrial fibrillation: Secondary | ICD-10-CM | POA: Diagnosis not present

## 2022-09-01 DIAGNOSIS — R82998 Other abnormal findings in urine: Secondary | ICD-10-CM | POA: Diagnosis not present

## 2022-09-01 DIAGNOSIS — M545 Low back pain, unspecified: Secondary | ICD-10-CM | POA: Diagnosis not present

## 2022-09-01 DIAGNOSIS — R17 Unspecified jaundice: Secondary | ICD-10-CM | POA: Diagnosis not present

## 2022-09-01 DIAGNOSIS — Z8679 Personal history of other diseases of the circulatory system: Secondary | ICD-10-CM | POA: Diagnosis not present

## 2022-09-01 DIAGNOSIS — Z95810 Presence of automatic (implantable) cardiac defibrillator: Secondary | ICD-10-CM | POA: Diagnosis not present

## 2022-09-01 DIAGNOSIS — N1831 Chronic kidney disease, stage 3a: Secondary | ICD-10-CM | POA: Diagnosis not present

## 2022-09-01 DIAGNOSIS — R7301 Impaired fasting glucose: Secondary | ICD-10-CM | POA: Diagnosis not present

## 2022-09-01 DIAGNOSIS — Z0000 Encounter for general adult medical examination without abnormal findings: Secondary | ICD-10-CM | POA: Diagnosis not present

## 2022-09-01 DIAGNOSIS — Z1339 Encounter for screening examination for other mental health and behavioral disorders: Secondary | ICD-10-CM | POA: Diagnosis not present

## 2022-09-01 DIAGNOSIS — Z7901 Long term (current) use of anticoagulants: Secondary | ICD-10-CM | POA: Diagnosis not present

## 2022-09-03 LAB — MISC LABCORP TEST (SEND OUT): Labcorp test code: 9985

## 2022-09-04 ENCOUNTER — Ambulatory Visit (INDEPENDENT_AMBULATORY_CARE_PROVIDER_SITE_OTHER): Payer: Medicare Other

## 2022-09-04 DIAGNOSIS — I472 Ventricular tachycardia, unspecified: Secondary | ICD-10-CM

## 2022-09-04 LAB — CUP PACEART REMOTE DEVICE CHECK
Battery Remaining Longevity: 40 mo
Battery Remaining Percentage: 40 %
Battery Voltage: 2.93 V
Brady Statistic AP VP Percent: 0 %
Brady Statistic AP VS Percent: 0 %
Brady Statistic AS VP Percent: 0 %
Brady Statistic AS VS Percent: 0 %
Brady Statistic RA Percent Paced: 1 %
Brady Statistic RV Percent Paced: 40 %
Date Time Interrogation Session: 20231222020015
HighPow Impedance: 51 Ohm
HighPow Impedance: 51 Ohm
Implantable Lead Connection Status: 753985
Implantable Lead Connection Status: 753985
Implantable Lead Implant Date: 20141205
Implantable Lead Implant Date: 20141205
Implantable Lead Location: 753859
Implantable Lead Location: 753860
Implantable Lead Model: 7121
Implantable Pulse Generator Implant Date: 20171221
Lead Channel Impedance Value: 440 Ohm
Lead Channel Impedance Value: 560 Ohm
Lead Channel Pacing Threshold Amplitude: 0.75 V
Lead Channel Pacing Threshold Amplitude: 1 V
Lead Channel Pacing Threshold Pulse Width: 0.5 ms
Lead Channel Pacing Threshold Pulse Width: 0.5 ms
Lead Channel Sensing Intrinsic Amplitude: 1.1 mV
Lead Channel Sensing Intrinsic Amplitude: 12 mV
Lead Channel Setting Pacing Amplitude: 2 V
Lead Channel Setting Pacing Amplitude: 2.5 V
Lead Channel Setting Pacing Pulse Width: 0.5 ms
Lead Channel Setting Sensing Sensitivity: 0.5 mV
Pulse Gen Serial Number: 7387049
Zone Setting Status: 755011

## 2022-09-10 ENCOUNTER — Encounter: Payer: Self-pay | Admitting: Physical Therapy

## 2022-09-10 ENCOUNTER — Ambulatory Visit: Payer: Medicare Other | Admitting: Physical Therapy

## 2022-09-10 DIAGNOSIS — R2681 Unsteadiness on feet: Secondary | ICD-10-CM

## 2022-09-10 DIAGNOSIS — R270 Ataxia, unspecified: Secondary | ICD-10-CM | POA: Diagnosis not present

## 2022-09-10 DIAGNOSIS — R269 Unspecified abnormalities of gait and mobility: Secondary | ICD-10-CM | POA: Diagnosis not present

## 2022-09-10 NOTE — Therapy (Signed)
OUTPATIENT PHYSICAL THERAPY NEURO EVALUATION   Patient Name: Joseph Morrow MRN: [id number] DOB:1945-11-09, 76 y.o., male Today's Date: 09/10/2022   PCP: PCP: Joseph Morrow., MD REFERRING PROVIDER: Edwin Morrow  END OF SESSION:  PT End of Session - 09/10/22 1815     Visit Number 2    Number of Visits 5    Date for PT Re-Evaluation 10/12/22    Authorization Type Medicare & BCBS    Progress Note Due on Visit 10    PT Start Time 1150    PT Stop Time 5277    PT Time Calculation (min) 45 min    Equipment Utilized During Treatment Gait belt    Activity Tolerance Patient tolerated treatment well    Behavior During Therapy WFL for tasks assessed/performed              Past Medical History:  Diagnosis Date   Acute cholecystitis 06/21/2011   Allergy    SEASONAL   Cataract    Complication of anesthesia    HALLUCINATIONS   ED (erectile dysfunction)    GERD (gastroesophageal reflux disease)    Hearing loss    Helicobacter pylori gastritis 06/2014   Hemorrhoids    Hiatal hernia    Hx of adenomatous colonic polyps 06/2014   Hypertrophic cardiomyopathy (HCC)    Mitral regurgitation    Moderate   Osteopenia    Paroxysmal atrial fibrillation ([place]) 04/2015   documented on ICD remote interrogation, treated with sotalol   Septal defect    LV Septal hyptertrophy 32m, normal EF, minimal LVOT gradiant.   Tubular adenoma of colon    VT (ventricular tachycardia) ([place] 08/18/2013   s/p St. Jude ICD   Past Surgical History:  Procedure Laterality Date   APPENDECTOMY  1950   ATRIAL FIBRILLATION ABLATION N/A 07/04/2020   Procedure: ATRIAL FIBRILLATION ABLATION;  Surgeon: AThompson Grayer MD;  [facility];  Service: Cardiovascular;  Laterality: N/A;   ATRIAL FIBRILLATION ABLATION N/A 09/18/2021   Procedure: ATRIAL FIBRILLATION ABLATION;  Surgeon: LVickie Epley MD;  [facility];  Service: Cardiovascular;  Laterality:  N/A;   CARDIOVERSION N/A 02/25/2017   Procedure: CARDIOVERSION;  Surgeon: RFay Records MD;  [facility];  Service: Cardiovascular;  Laterality: N/A;   CARDIOVERSION N/A 07/30/2021   Procedure: CARDIOVERSION;  Surgeon: BJanina Mayo MD;  [place]  Service: Cardiovascular;  Laterality: N/A;   CARDIOVERSION N/A 04/27/2022   Procedure: CARDIOVERSION;  Surgeon: SJerline Pain MD;  [place]  Service: Cardiovascular;  Laterality: N/A;   CATARACT EXTRACTION, BILATERAL     CHOLECYSTECTOMY  06/24/2011   COLONOSCOPY     EP IMPLANTABLE DEVICE N/A 09/03/2016   SJM Fortify [address] Dr generator change by Dr ARayann Heman  ICD implanted for secondary prevention of sudden death   IMPLANTABLE CARDIOVERTER DEFIBRILLATOR IMPLANT  08/18/2013   St. Jude Fortify [address] DR ICD implanted by Dr ARayann Hemanfor secondary treatment of VT   IMPLANTABLE CARDIOVERTER DEFIBRILLATOR IMPLANT N/A 08/18/2013   Procedure: IMPLANTABLE CARDIOVERTER DEFIBRILLATOR IMPLANT;  Surgeon: JCoralyn Mark MD;  [facility];  Service: Cardiovascular;  Laterality: N/A;   TEE WITHOUT CARDIOVERSION N/A 07/30/2021   Procedure: TRANSESOPHAGEAL ECHOCARDIOGRAM (TEE);  Surgeon: BJanina Mayo MD;  [facility];  Service: Cardiovascular;  Laterality: N/A;   Patient Active Problem List   Diagnosis Date Noted   Transaminitis 08/14/2022   Ataxia 08/14/2022   RSV (respiratory syncytial virus pneumonia)  08/13/2022   Gait disturbance 08/13/2022   Secondary hypercoagulable state ([place]) 07/08/2022   Atypical atrial flutter (HCC)    Typical atrial flutter ([place]) 02/23/2017   Rupture of distal biceps tendon, left, initial encounter 12/23/2016   Multiple lung nodules on CT 03/20/2016   Cough 03/10/2016   Nausea 11/22/2015   PAF (paroxysmal atrial fibrillation) ([place]) 08/20/2015   Hx of colonic polyps 05/14/2014   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/04/2013   VT (ventricular  tachycardia) ([place]) 09/04/2013   V tach ([place]) 08/18/2013   Hypertrophic cardiomyopathy ([place])    Heart murmur    Mitral regurgitation    GERD (gastroesophageal reflux disease)     ONSET DATE: 08/15/2022 (date of referral)  REFERRING DIAG: R26 Gait abnormality   THERAPY DIAG:  Unsteadiness on feet  Rationale for Evaluation and Treatment: Rehabilitation  SUBJECTIVE:                                                                                                                                                                                             SUBJECTIVE STATEMENT: Patient reports he has no dizziness - just imbalance - states he will occasionally lose balance when he is standing still; states he is unable to stand heel to toe  Pt accompanied by: self  PERTINENT HISTORY:  Per 08/13/2022 Hospital note: "Mr. Joseph Morrow is a 76 y.o. 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 In the ER, he also described symptoms of ataxia, generalized weakness, and nightmares, and so Neurology were consulted.Encephalopathy ruled out.  MRI brain, cervical and lumbar spine without contrast was motion degraded but otherwise within normal limits." Patient symptoms resolved within 30 hours of steroid ending per notes  Per chart Progress note on 08/15/2022: "Patient was "positive for RSV. He was previously prescribed antibiotics and steroids for his respiratory infection. He saw his PCP on 11/27 and took the steroids and antibiotics for 2 days (stopped 11/29) due to nightmares, anxiety, and abnormal gait.... Patient/wife initially reported last dose of steroid sever days prior to admission to attending MD, but this has been clarified and confirmed to have been taken day of presentation last."   Atypical atrial flutter Cardioversion scheduled for 09/15/2022  PAIN:  Are you having pain? No  PRECAUTIONS:  Fall; mask for RSV (donned throughout session)  WEIGHT BEARING RESTRICTIONS: No  FALLS: Has patient fallen in last 6 months? Yes, Reports x1 with reaction to steroid but denies injuries, reports multiple near falls within the last 6 months, cannot recall specific amounts, uses  UE to steady.   LIVING ENVIRONMENT: Lives with: lives with their spouse Lives in: House/apartment Stairs:  2 steps in the garage- currently no railing but plans to add soon; and about 14 steps into attic with railing Has following equipment at home: None  PLOF: Independent  PATIENT GOALS: Patient reports, "I would like to stand up straight." "Improve balance."  OBJECTIVE:   Sensory Organization Test - composite score 63/100:  N= 64/100 Somatosensory input WNL's Visual input WNL's Vestibular input decreased at 44/100; N= 50/100  Standing Balance: Surface: Pillows Position: Narrow Base of Support Feet Hip Width Apart Completed with: Eyes Open and Eyes Closed; Head Turns x 5 Reps and Head Nods x 5 Reps  Single Leg Stance:   Surface: Floor  Lower Extremity: RLE and LLE  Time: 10 secs with UE support prn  Tandem Stance:  Surface: Floor   Eyes open - 30 sec hold each position  Access Code: [email or web address] URL: [email or web address] Date: 09/10/2022 Prepared by: Ethelene Browns  Exercises - Tandem Stance with Support  - 1 x daily - 7 x weekly - 1 sets - 2 reps - 30 sec hold - Single Leg Stance with Support  - 1 x daily - 7 x weekly - 2 sets - 1-2 reps - 10 sec hold - standing with feet apart on pillow  - 1 x daily - 7 x weekly - 1 sets - 1 reps - Romberg Stance Eyes Closed on Foam Pad  - 1 x daily - 7 x weekly - 3 sets - 10 reps   PATIENT EDUCATION: Education details: Medbridge HEP - see above  Person educated: Patient Education method: Explanation Education comprehension: verbalized understanding and needs further education  HOME EXERCISE PROGRAM: Issued Medbridge HEP - see  above     GOALS: Goals reviewed with patient? Yes  SHORT TERM GOALS: Target date: 09/14/2022  Patient will demonstrate 100% compliance with initial HEP to continue to progress between physical therapy sessions.   Baseline: To be provided Goal status: INITIAL  2.  Patient will demonstrate forward gait with head turns up and down without reports of feeling off balance and without gait deviations to improve overall sense of balance (FGA score for this component 3/3) Baseline: Initial: 2/3 on FGA Goal status: INITIAL  3.  SOT goal to be assessed Baseline: To be assessed Goal status: Goal deferred as composite score is only 1 point decreased from Normal    LONG TERM GOALS: Target date: 10/12/2022   Patient will report demonstrate independence with final HEP in order to maintain current gains and continue to progress after physical therapy discharge.   Baseline: To be provided Goal status: INITIAL  2.  Patient will improve FGA to greater than 22/30 to indicate a decreased risk of falls and improved dynamic stability.   Baseline: 20/30 Goal status: INITIAL  3.  SOT goal to be assessed Baseline: To be assessed Goal status: Goal deferred due to initial high score of SOT  - 09-10-22 ASSESSMENT:  CLINICAL IMPRESSION: PT session focused on further balance testing with use of SOT for assessment of vestibular input; pt's composite score 63/100 with N= 64/100;  Somatosensory and visual inputs WNL's with vestibular input only slightly decreased from normal.  Pt has decreased high level balance skills including tandem and SLS.  Pt also has more difficulty maintaining balance with vertical head turns than with horizontal head turns.  Cont with POC.  OBJECTIVE IMPAIRMENTS: Abnormal gait, decreased balance, and decreased strength.  ACTIVITY LIMITATIONS: bending  PARTICIPATION LIMITATIONS: yard work  PERSONAL FACTORS: Age and Past/current experiences are also affecting patient's functional  outcome.   REHAB POTENTIAL: Excellent  CLINICAL DECISION MAKING: Stable/uncomplicated  EVALUATION COMPLEXITY: Low  PLAN:  PT FREQUENCY: 1x/week  PT DURATION: 6 weeks  PLANNED INTERVENTIONS: Therapeutic exercises, Therapeutic activity, Neuromuscular re-education, Balance training, Gait training, Patient/Family education, Vestibular training, and Canalith repositioning  PLAN FOR NEXT SESSION: check HEP for any ?'s or problems:  high level dynamic balance tasks  Guido Sander, PT  09/10/2022, 6:19 PM

## 2022-09-15 ENCOUNTER — Other Ambulatory Visit: Payer: Self-pay

## 2022-09-15 ENCOUNTER — Ambulatory Visit (HOSPITAL_COMMUNITY)
Admission: RE | Admit: 2022-09-15 | Discharge: 2022-09-15 | Disposition: A | Discharge status: Home / Self Care | Payer: Medicare Other | Source: Ambulatory Visit | Attending: Physician Assistant | Admitting: Physician Assistant

## 2022-09-15 ENCOUNTER — Encounter (HOSPITAL_COMMUNITY)
Admission: RE | Disposition: A | Discharge status: Home / Self Care | Payer: Self-pay | Source: Home / Self Care | Attending: Internal Medicine

## 2022-09-15 ENCOUNTER — Encounter (HOSPITAL_COMMUNITY): Payer: Self-pay | Admitting: Internal Medicine

## 2022-09-15 ENCOUNTER — Ambulatory Visit (HOSPITAL_BASED_OUTPATIENT_CLINIC_OR_DEPARTMENT_OTHER): Payer: Medicare Other | Admitting: Anesthesiology

## 2022-09-15 ENCOUNTER — Ambulatory Visit (HOSPITAL_COMMUNITY): Payer: Medicare Other | Admitting: Anesthesiology

## 2022-09-15 ENCOUNTER — Ambulatory Visit (HOSPITAL_COMMUNITY)
Admission: RE | Admit: 2022-09-15 | Discharge: 2022-09-15 | Disposition: A | Discharge status: Home / Self Care | Payer: Medicare Other | Attending: Internal Medicine | Admitting: Internal Medicine

## 2022-09-15 DIAGNOSIS — I480 Paroxysmal atrial fibrillation: Secondary | ICD-10-CM | POA: Diagnosis not present

## 2022-09-15 DIAGNOSIS — Z79899 Other long term (current) drug therapy: Secondary | ICD-10-CM | POA: Diagnosis not present

## 2022-09-15 DIAGNOSIS — Z87891 Personal history of nicotine dependence: Secondary | ICD-10-CM | POA: Insufficient documentation

## 2022-09-15 DIAGNOSIS — K219 Gastro-esophageal reflux disease without esophagitis: Secondary | ICD-10-CM | POA: Diagnosis not present

## 2022-09-15 DIAGNOSIS — I4819 Other persistent atrial fibrillation: Secondary | ICD-10-CM

## 2022-09-15 DIAGNOSIS — K449 Diaphragmatic hernia without obstruction or gangrene: Secondary | ICD-10-CM | POA: Insufficient documentation

## 2022-09-15 DIAGNOSIS — J189 Pneumonia, unspecified organism: Secondary | ICD-10-CM

## 2022-09-15 DIAGNOSIS — I4891 Unspecified atrial fibrillation: Secondary | ICD-10-CM | POA: Diagnosis not present

## 2022-09-15 DIAGNOSIS — I484 Atypical atrial flutter: Secondary | ICD-10-CM | POA: Diagnosis not present

## 2022-09-15 DIAGNOSIS — Z7901 Long term (current) use of anticoagulants: Secondary | ICD-10-CM | POA: Diagnosis not present

## 2022-09-15 DIAGNOSIS — Z95810 Presence of automatic (implantable) cardiac defibrillator: Secondary | ICD-10-CM | POA: Insufficient documentation

## 2022-09-15 DIAGNOSIS — I4892 Unspecified atrial flutter: Secondary | ICD-10-CM | POA: Diagnosis not present

## 2022-09-15 HISTORY — PX: CARDIOVERSION: SHX1299

## 2022-09-15 LAB — CBC
HCT: 49.6 % (ref 39.0–52.0)
Hemoglobin: 16 g/dL (ref 13.0–17.0)
MCH: 29.4 pg (ref 26.0–34.0)
MCHC: 32.3 g/dL (ref 30.0–36.0)
MCV: 91 fL (ref 80.0–100.0)
Platelets: 183 10*3/uL (ref 150–400)
RBC: 5.45 MIL/uL (ref 4.22–5.81)
RDW: 14.3 % (ref 11.5–15.5)
WBC: 7 10*3/uL (ref 4.0–10.5)
nRBC: 0 % (ref 0.0–0.2)

## 2022-09-15 LAB — BASIC METABOLIC PANEL
Anion gap: 7 (ref 5–15)
BUN: 17 mg/dL (ref 8–23)
CO2: 23 mmol/L (ref 22–32)
Calcium: 9.3 mg/dL (ref 8.9–10.3)
Chloride: 106 mmol/L (ref 98–111)
Creatinine, Ser: 1.45 mg/dL — ABNORMAL HIGH (ref 0.61–1.24)
GFR, Estimated: 50 mL/min — ABNORMAL LOW (ref >60)
Glucose, Bld: 104 mg/dL — ABNORMAL HIGH (ref 70–99)
Potassium: 5.4 mmol/L — ABNORMAL HIGH (ref 3.5–5.1)
Sodium: 136 mmol/L (ref 135–145)

## 2022-09-15 SURGERY — CARDIOVERSION
Anesthesia: General

## 2022-09-15 MED ORDER — PROPOFOL 10 MG/ML IV BOLUS
INTRAVENOUS | Status: DC | PRN
  Administered 2022-09-15: 50 mg via INTRAVENOUS
  Administered 2022-09-15: 20 mg via INTRAVENOUS

## 2022-09-15 MED ORDER — SODIUM CHLORIDE 0.9 % IV SOLN: INTRAVENOUS | Status: DC

## 2022-09-15 MED ORDER — LIDOCAINE 2% (20 MG/ML) 5 ML SYRINGE
INTRAMUSCULAR | Status: DC | PRN
  Administered 2022-09-15: 60 mg via INTRAVENOUS

## 2022-09-15 MED ORDER — SODIUM CHLORIDE 0.9 % IV SOLN
INTRAVENOUS | Status: AC | PRN
  Administered 2022-09-15: 500 mL via INTRAVENOUS

## 2022-09-15 NOTE — Anesthesia Preprocedure Evaluation (Signed)
Anesthesia Evaluation  Patient identified by MRN, date of birth, ID band Patient awake    Reviewed: Allergy & Precautions, NPO status , Patient's Chart, lab work & pertinent test results  History of Anesthesia Complications Negative for: history of anesthetic complications  Airway Mallampati: III  TM Distance: >3 FB Neck ROM: Full    Dental  (+) Dental Advisory Given   Pulmonary pneumonia, former smoker   Pulmonary exam normal        Cardiovascular + dysrhythmias Atrial Fibrillation and Ventricular Tachycardia + Cardiac Defibrillator + Valvular Problems/Murmurs MR  Rhythm:Irregular Rate:Normal   '22 TEE - EF 60 to 65%. Left atrial size was moderately dilated. Right atrial size was moderately dilated. Mild MR. Mild-Mod TR.   Neuro/Psych negative neurological ROS  negative psych ROS   GI/Hepatic Neg liver ROS, hiatal hernia,GERD  Medicated and Controlled,,  Endo/Other  negative endocrine ROS    Renal/GU negative Renal ROS     Musculoskeletal negative musculoskeletal ROS (+)    Abdominal   Peds  Hematology  On eliquis    Anesthesia Other Findings   Reproductive/Obstetrics                             Anesthesia Physical Anesthesia Plan  ASA: 3  Anesthesia Plan: General   Post-op Pain Management:    Induction: Intravenous  PONV Risk Score and Plan: 2 and Treatment may vary due to age or medical condition and Propofol infusion  Airway Management Planned: Natural Airway, Mask and Simple Face Mask  Additional Equipment: None  Intra-op Plan:   Post-operative Plan:   Informed Consent: I have reviewed the patients History and Physical, chart, labs and discussed the procedure including the risks, benefits and alternatives for the proposed anesthesia with the patient or authorized representative who has indicated his/her understanding and acceptance.       Plan Discussed with:  CRNA and Anesthesiologist  Anesthesia Plan Comments:         Anesthesia Quick Evaluation

## 2022-09-15 NOTE — CV Procedure (Signed)
    Electrical Cardioversion Procedure Note Joseph Morrow [id number] 08/25/1946  Procedure: Electrical Cardioversion Indications:  Atrial Flutter  Time Out: Verified patient identification, verified procedure,medications/allergies/relevent history reviewed, required imaging and test results available.  Performed  Procedure Details  The patient was NPO after midnight. Anesthesia was administered at the beside  by Dr. Lanetta Inch.  Cardioversion was done with synchronized biphasic defibrillation with AP pads with 200 Joules.  The patient converted to normal sinus rhythm. The patient tolerated the procedure well   IMPRESSION:  Successful cardioversion of atrial fibrillation    Joseph Morrow A Joseph Morrow 09/15/2022, 12:57 PM

## 2022-09-15 NOTE — Interval H&P Note (Signed)
History and Physical Interval Note:  09/15/2022 12:39 PM  Joseph Morrow [facility]  has presented today for surgery, with the diagnosis of AFIB.  The various methods of treatment have been discussed with the patient and family. After consideration of risks, benefits and other options for treatment, the patient has consented to  Procedure(s): CARDIOVERSION (N/A) as a surgical intervention.  The patient's history has been reviewed, patient examined, no change in status, stable for surgery.  I have reviewed the patient's chart and labs.  Questions were answered to the patient's satisfaction.     Chanell Nadeau A Strider Vallance

## 2022-09-15 NOTE — Discharge Instructions (Signed)

## 2022-09-15 NOTE — Transfer of Care (Signed)
Immediate Anesthesia Transfer of Care Note  Patient: Joseph Morrow [facility]  Procedure(s) Performed: CARDIOVERSION  Patient Location: Endoscopy Unit  Anesthesia Type:General  Level of Consciousness: drowsy and patient cooperative  Airway & Oxygen Therapy: Patient Spontanous Breathing  Post-op Assessment: Report given to RN and Post -op Vital signs reviewed and stable  Post vital signs: Reviewed and stable  Last Vitals:  Vitals Value Taken Time  BP    Temp    Pulse    Resp    SpO2      Last Pain:  Vitals:   09/15/22 1156  TempSrc: Temporal  PainSc: 0-No pain         Complications: No notable events documented.

## 2022-09-16 ENCOUNTER — Encounter (HOSPITAL_COMMUNITY): Payer: Self-pay | Admitting: Internal Medicine

## 2022-09-16 NOTE — Anesthesia Postprocedure Evaluation (Signed)
Anesthesia Post Note  Patient: Joseph Morrow [facility]  Procedure(s) Performed: CARDIOVERSION     Patient location during evaluation: Endoscopy Anesthesia Type: General Level of consciousness: awake and alert Pain management: pain level controlled Vital Signs Assessment: post-procedure vital signs reviewed and stable Respiratory status: spontaneous breathing, nonlabored ventilation, respiratory function stable and patient connected to nasal cannula oxygen Cardiovascular status: blood pressure returned to baseline and stable Postop Assessment: no apparent nausea or vomiting Anesthetic complications: no  No notable events documented.  Last Vitals:  Vitals:   09/15/22 1310 09/15/22 1320  BP: 99/76 96/77  Pulse: (!) 51 (!) 52  Resp: 17 (!) 7  Temp:    SpO2: 96% 98%    Last Pain:  Vitals:   09/15/22 1156  TempSrc: Temporal  PainSc: 0-No pain                 Joseph Morrow L Joseph Morrow

## 2022-09-17 ENCOUNTER — Encounter: Payer: Self-pay | Admitting: Physical Therapy

## 2022-09-17 ENCOUNTER — Ambulatory Visit: Payer: Medicare Other | Attending: Family Medicine | Admitting: Physical Therapy

## 2022-09-17 DIAGNOSIS — R42 Dizziness and giddiness: Secondary | ICD-10-CM | POA: Insufficient documentation

## 2022-09-17 DIAGNOSIS — R269 Unspecified abnormalities of gait and mobility: Secondary | ICD-10-CM | POA: Diagnosis not present

## 2022-09-17 DIAGNOSIS — R2681 Unsteadiness on feet: Secondary | ICD-10-CM | POA: Insufficient documentation

## 2022-09-17 NOTE — Therapy (Signed)
OUTPATIENT PHYSICAL THERAPY NEURO EVALUATION   Patient Name: Joseph Morrow MRN: [id number] DOB:18-Sep-1945, 77 y.o., male Today's Date: 09/17/2022   PCP: PCP: Joseph Morrow., MD REFERRING PROVIDER: Edwin Morrow  END OF SESSION:  PT End of Session - 09/17/22 1614     Visit Number 3    Number of Visits 5    Date for PT Re-Evaluation 10/12/22    Authorization Type Medicare & BCBS    Progress Note Due on Visit 10    PT Start Time 1147    PT Stop Time 1230    PT Time Calculation (min) 43 min    Equipment Utilized During Treatment --    Activity Tolerance Patient tolerated treatment well    Behavior During Therapy WFL for tasks assessed/performed               Past Medical History:  Diagnosis Date   Acute cholecystitis 06/21/2011   Allergy    SEASONAL   Cataract    Complication of anesthesia    HALLUCINATIONS   ED (erectile dysfunction)    GERD (gastroesophageal reflux disease)    Hearing loss    Helicobacter pylori gastritis 06/2014   Hemorrhoids    Hiatal hernia    Hx of adenomatous colonic polyps 06/2014   Hypertrophic cardiomyopathy ([place])    Mitral regurgitation    Moderate   Osteopenia    Paroxysmal atrial fibrillation ([place]) 04/2015   documented on ICD remote interrogation, treated with sotalol   Septal defect    LV Septal hyptertrophy 36m, normal EF, minimal LVOT gradiant.   Tubular adenoma of colon    [place]ricular tachycardia) ([place] 08/18/2013   s/p St. Jude ICD   Past Surgical History:  Procedure Laterality Date   APPENDECTOMY  1950   ATRIAL FIBRILLATION ABLATION N/A 07/04/2020   Procedure: ATRIAL FIBRILLATION ABLATION;  Surgeon: AThompson Grayer MD;  [facility];  Service: Cardiovascular;  Laterality: N/A;   ATRIAL FIBRILLATION ABLATION N/A 09/18/2021   Procedure: ATRIAL FIBRILLATION ABLATION;  Surgeon: LVickie Epley MD;  [facility];  Service: Cardiovascular;  Laterality: N/A;    CARDIOVERSION N/A 02/25/2017   Procedure: CARDIOVERSION;  Surgeon: RFay Records MD;  [facility];  Service: Cardiovascular;  Laterality: N/A;   CARDIOVERSION N/A 07/30/2021   Procedure: CARDIOVERSION;  Surgeon: BJanina Mayo MD;  [place]  Service: Cardiovascular;  Laterality: N/A;   CARDIOVERSION N/A 04/27/2022   Procedure: CARDIOVERSION;  Surgeon: SJerline Pain MD;  [place]  Service: Cardiovascular;  Laterality: N/A;   CARDIOVERSION N/A 09/15/2022   Procedure: CARDIOVERSION;  Surgeon: CWerner Lean MD;  [place]  Service: Cardiovascular;  Laterality: N/A;   CATARACT EXTRACTION, BILATERAL     CHOLECYSTECTOMY  06/24/2011   COLONOSCOPY     EP IMPLANTABLE DEVICE N/A 09/03/2016   SJM Fortify Assura Dr generator change by Dr ARayann Heman  ICD implanted for secondary prevention of sudden death   IMPLANTABLE CARDIOVERTER DEFIBRILLATOR IMPLANT  08/18/2013   St. Jude Fortify Assura DR ICD implanted by Dr ARayann Hemanfor secondary treatment of VT   IMPLANTABLE CARDIOVERTER DEFIBRILLATOR IMPLANT N/A 08/18/2013   Procedure: IMPLANTABLE CARDIOVERTER DEFIBRILLATOR IMPLANT;  Surgeon: JCoralyn Mark MD;  [facility];  Service: Cardiovascular;  Laterality: N/A;   TEE WITHOUT CARDIOVERSION N/A 07/30/2021   Procedure: TRANSESOPHAGEAL ECHOCARDIOGRAM (TEE);  Surgeon: BJanina Mayo MD;  [place]  Service: Cardiovascular;  Laterality: N/A;  Patient Active Problem List   Diagnosis Date Noted   Transaminitis 08/14/2022   Ataxia 08/14/2022   RSV (respiratory syncytial virus pneumonia) 08/13/2022   Gait disturbance 08/13/2022   Secondary hypercoagulable state ([place]) 07/08/2022   Atypical atrial flutter (HCC)    Typical atrial flutter ([place]) 02/23/2017   Rupture of distal biceps tendon, left, initial encounter 12/23/2016   Multiple lung nodules on CT 03/20/2016   Cough 03/10/2016   Nausea 11/22/2015   PAF (paroxysmal atrial  fibrillation) ([place]) 08/20/2015   Hx of colonic polyps 05/14/2014   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/04/2013   VT (ventricular tachycardia) ([place]) 09/04/2013   V tach ([place]) 08/18/2013   Hypertrophic cardiomyopathy ([place])    Heart murmur    Mitral regurgitation    GERD (gastroesophageal reflux disease)     ONSET DATE: 08/15/2022 (date of referral)  REFERRING DIAG: R26 Gait abnormality   THERAPY DIAG:  Unsteadiness on feet  Abnormality of gait and mobility  Dizziness and giddiness  Rationale for Evaluation and Treatment: Rehabilitation  SUBJECTIVE:                                                                                                                                                                                             SUBJECTIVE STATEMENT: Patient reports he had cardioversion procedure on Tuesday for the a-fib. - states he is doing better now.  Reports he is feeling more confident with the exercises at home.  Pt accompanied by: self  PERTINENT HISTORY:  Per 08/13/2022 Hospital note: "Joseph Morrow is a 77 y.o. 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 In the ER, he also described symptoms of ataxia, generalized weakness, and nightmares, and so Neurology were consulted.Encephalopathy ruled out.  MRI brain, cervical and lumbar spine without contrast was motion degraded but otherwise within normal limits." Patient symptoms resolved within 30 hours of steroid ending per notes  Per chart Progress note on 08/15/2022: "Patient was "positive for RSV. He was previously prescribed antibiotics and steroids for his respiratory infection. He saw his PCP on 11/27 and took the steroids and antibiotics for 2 days (stopped 11/29) due to nightmares, anxiety, and abnormal gait.... Patient/wife initially reported last dose of steroid sever days prior to admission to  attending MD, but this has been clarified and confirmed to have been taken day of presentation last."   Atypical atrial flutter Cardioversion scheduled for 09/15/2022  PAIN:  Are you having pain? No  PRECAUTIONS: Fall; mask for RSV (donned throughout session)  WEIGHT BEARING RESTRICTIONS:  No  FALLS: Has patient fallen in last 6 months? Yes, Reports x1 with reaction to steroid but denies injuries, reports multiple near falls within the last 6 months, cannot recall specific amounts, uses UE to steady.   LIVING ENVIRONMENT: Lives with: lives with their spouse Lives in: House/apartment Stairs:  2 steps in the garage- currently no railing but plans to add soon; and about 14 steps into attic with railing Has following equipment at home: None  PLOF: Independent  PATIENT GOALS: Patient reports, "I would like to stand up straight." "Improve balance."  NMR:    Standing Balance: Surface: Pillows Position: Narrow Base of Support Feet Hip Width Apart Completed with: Eyes Open and Eyes Closed; Head Turns x 5 Reps and Head Nods x 5 Reps  Single Leg Stance:   Surface: Floor  Lower Extremity: RLE and LLE  Time: 11.2 secs on LLE:  6.6 secs on RLE  Balance/Vestibular exercises:  Pt performed marching on pillows with EO 10 reps; head turns horizontally 5 reps with EO and then vertical head turns EO 5 reps Stepping down from pillow to floor with head turn to opposite side - 5 reps each leg  Ambulating tossing ball straight up 30' x 1 rep; backwards 30' x 1 rep with ball toss with CGA Amb. Tossing ball on Rt/Lt sides for improved balance with head turns and gaze stabilization 30'x 1 rep  Pt performed standing on mat on incline/decline - marching in place 5 reps on incline/decline; focusing on object straight ahead with CGA; added head turns with marching on both incline 5 reps and then on decline with min assist needed intermittently with balance recovery  Rockerboard - with UE support prn  inside // bars - 10 reps EO; 10 reps EC with CGA to min assist; holding board steady - pt performed horizontal head turns 5 reps and then vertical head turns 5 reps with CGA for safety & recovery of LOB  Amb. 30' forward making circles clockwise with ball, then counterclockwise direction with ball for improved gaze stabilization during gait - SBA   Access Code: [email or web address] URL: [email or web address] Date: 09/10/2022 Prepared by: Ethelene Browns  Exercises - Tandem Stance with Support  - 1 x daily - 7 x weekly - 1 sets - 2 reps - 30 sec hold - Single Leg Stance with Support  - 1 x daily - 7 x weekly - 2 sets - 1-2 reps - 10 sec hold - standing with feet apart on pillow  - 1 x daily - 7 x weekly - 1 sets - 1 reps - Romberg Stance Eyes Closed on Foam Pad  - 1 x daily - 7 x weekly - 3 sets - 10 reps   PATIENT EDUCATION: Education details: Medbridge HEP - see above  Person educated: Patient Education method: Explanation Education comprehension: verbalized understanding and needs further education  HOME EXERCISE PROGRAM: Issued Medbridge HEP - see above     GOALS: Goals reviewed with patient? Yes  SHORT TERM GOALS: Target date: 09/14/2022  Patient will demonstrate 100% compliance with initial HEP to continue to progress between physical therapy sessions.   Baseline: To be provided Goal status: Ongoing - HEP initiated on 09-10-22  2.  Patient will demonstrate forward gait with head turns up and down without reports of feeling off balance and without gait deviations to improve overall sense of balance (FGA score for this component 3/3) Baseline: Initial: 2/3 on FGA Goal status: Ongoing - pt reports more dizziness provoked with  vertical head turns than with horizontal head turns - 09-17-22  3.  SOT goal to be assessed Baseline: To be assessed Goal status: Goal deferred as composite score is only 1 point decreased from Normal    LONG TERM GOALS: Target date:  10/12/2022   Patient will report demonstrate independence with final HEP in order to maintain current gains and continue to progress after physical therapy discharge.   Baseline: To be provided Goal status: INITIAL  2.  Patient will improve FGA to greater than 22/30 to indicate a decreased risk of falls and improved dynamic stability.   Baseline: 20/30 Goal status: INITIAL  3.  SOT goal to be assessed Baseline: To be assessed Goal status: Goal deferred due to initial high score of SOT  - 09-10-22 ASSESSMENT:  CLINICAL IMPRESSION: PT session focused on STG assessment & balance training with increased vestibular input incorporated for maintaining balance.  Pt did well with maintaining balance on compliant surfaces; pt did report increased dizziness at end of session, possibly due to cumulative effect of vestibular exercises.  Pt reported slightly more dizziness provoked with vertical head turns than with horizontal head turns.  Cont with POC.  OBJECTIVE IMPAIRMENTS: Abnormal gait, decreased balance, and decreased strength.   ACTIVITY LIMITATIONS: bending  PARTICIPATION LIMITATIONS: yard work  PERSONAL FACTORS: Age and Past/current experiences are also affecting patient's functional outcome.   REHAB POTENTIAL: Excellent  CLINICAL DECISION MAKING: Stable/uncomplicated  EVALUATION COMPLEXITY: Low  PLAN:  PT FREQUENCY: 1x/week  PT DURATION: 6 weeks  PLANNED INTERVENTIONS: Therapeutic exercises, Therapeutic activity, Neuromuscular re-education, Balance training, Gait training, Patient/Family education, Vestibular training, and Canalith repositioning  PLAN FOR NEXT SESSION:  high level dynamic balance tasks with increased vestibular input incorporated   Guido Sander, PT  09/17/2022, 4:18 PM

## 2022-09-21 DIAGNOSIS — R293 Abnormal posture: Secondary | ICD-10-CM | POA: Diagnosis not present

## 2022-09-21 DIAGNOSIS — M5137 Other intervertebral disc degeneration, lumbosacral region: Secondary | ICD-10-CM | POA: Diagnosis not present

## 2022-09-21 DIAGNOSIS — M9902 Segmental and somatic dysfunction of thoracic region: Secondary | ICD-10-CM | POA: Diagnosis not present

## 2022-09-21 DIAGNOSIS — M9901 Segmental and somatic dysfunction of cervical region: Secondary | ICD-10-CM | POA: Diagnosis not present

## 2022-09-21 DIAGNOSIS — M791 Myalgia, unspecified site: Secondary | ICD-10-CM | POA: Diagnosis not present

## 2022-09-21 DIAGNOSIS — M50222 Other cervical disc displacement at C5-C6 level: Secondary | ICD-10-CM | POA: Diagnosis not present

## 2022-09-21 DIAGNOSIS — M9903 Segmental and somatic dysfunction of lumbar region: Secondary | ICD-10-CM | POA: Diagnosis not present

## 2022-09-21 DIAGNOSIS — M542 Cervicalgia: Secondary | ICD-10-CM | POA: Diagnosis not present

## 2022-09-21 DIAGNOSIS — M5459 Other low back pain: Secondary | ICD-10-CM | POA: Diagnosis not present

## 2022-09-24 ENCOUNTER — Ambulatory Visit: Payer: Medicare Other | Admitting: Physical Therapy

## 2022-09-24 ENCOUNTER — Telehealth: Payer: Self-pay | Admitting: Physical Therapy

## 2022-09-24 VITALS — BP 120/65 | HR 79

## 2022-09-24 DIAGNOSIS — R269 Unspecified abnormalities of gait and mobility: Secondary | ICD-10-CM | POA: Diagnosis not present

## 2022-09-24 DIAGNOSIS — R42 Dizziness and giddiness: Secondary | ICD-10-CM | POA: Diagnosis not present

## 2022-09-24 DIAGNOSIS — R2681 Unsteadiness on feet: Secondary | ICD-10-CM

## 2022-09-24 NOTE — Therapy (Signed)
OUTPATIENT PHYSICAL THERAPY NEURO TREATMENT   Patient Name: Joseph Morrow MRN: [id number] DOB:1946-05-17, 77 y.o., male Today's Date: 09/24/2022   PCP: PCP: Ginger Organ., MD REFERRING PROVIDER: Edwin Dada  END OF SESSION:  PT End of Session - 09/24/22 1102     Visit Number 4    Number of Visits 5    Date for PT Re-Evaluation 10/12/22    Authorization Type Medicare & BCBS    Progress Note Due on Visit 10    PT Start Time 1101    PT Stop Time 1147    PT Time Calculation (min) 46 min    Equipment Utilized During Treatment Gait belt    Activity Tolerance Patient tolerated treatment well    Behavior During Therapy WFL for tasks assessed/performed               Past Medical History:  Diagnosis Date   Acute cholecystitis 06/21/2011   Allergy    SEASONAL   Cataract    Complication of anesthesia    HALLUCINATIONS   ED (erectile dysfunction)    GERD (gastroesophageal reflux disease)    Hearing loss    Helicobacter pylori gastritis 06/2014   Hemorrhoids    Hiatal hernia    Hx of adenomatous colonic polyps 06/2014   Hypertrophic cardiomyopathy (HCC)    Mitral regurgitation    Moderate   Osteopenia    Paroxysmal atrial fibrillation ([place]) 04/2015   documented on ICD remote interrogation, treated with sotalol   Septal defect    LV Septal hyptertrophy 40m, normal EF, minimal LVOT gradiant.   Tubular adenoma of colon    VT ([place]ricular tachycardia) ([place] 08/18/2013   s/p St. Jude ICD   Past Surgical History:  Procedure Laterality Date   APPENDECTOMY  1950   ATRIAL FIBRILLATION ABLATION N/A 07/04/2020   Procedure: ATRIAL FIBRILLATION ABLATION;  Surgeon: AThompson Grayer MD;  [place];  Service: Cardiovascular;  Laterality: N/A;   ATRIAL FIBRILLATION ABLATION N/A 09/18/2021   Procedure: ATRIAL FIBRILLATION ABLATION;  Surgeon: LVickie Epley MD;  [facility];  Service: Cardiovascular;  Laterality:  N/A;   CARDIOVERSION N/A 02/25/2017   Procedure: CARDIOVERSION;  Surgeon: RFay Records MD;  [facility];  Service: Cardiovascular;  Laterality: N/A;   CARDIOVERSION N/A 07/30/2021   Procedure: CARDIOVERSION;  Surgeon: BJanina Mayo MD;  Location: [facility]  Service: Cardiovascular;  Laterality: N/A;   CARDIOVERSION N/A 04/27/2022   Procedure: CARDIOVERSION;  Surgeon: SJerline Pain MD;  [place]  Service: Cardiovascular;  Laterality: N/A;   CARDIOVERSION N/A 09/15/2022   Procedure: CARDIOVERSION;  Surgeon: CWerner Lean MD;  [place] [facility]  Service: Cardiovascular;  Laterality: N/A;   CATARACT EXTRACTION, BILATERAL     CHOLECYSTECTOMY  06/24/2011   COLONOSCOPY     EP IMPLANTABLE DEVICE N/A 09/03/2016   SJM Fortify Assura Dr generator change by Dr ARayann Heman  ICD implanted for secondary prevention of sudden death   IMPLANTABLE CARDIOVERTER DEFIBRILLATOR IMPLANT  08/18/2013   St. Jude Fortify Assura DR ICD implanted by Dr ARayann Hemanfor secondary treatment of VT   IMPLANTABLE CARDIOVERTER DEFIBRILLATOR IMPLANT N/A 08/18/2013   Procedure: IMPLANTABLE CARDIOVERTER DEFIBRILLATOR IMPLANT;  Surgeon: JCoralyn Mark MD;  [facility];  Service: Cardiovascular;  Laterality: N/A;   TEE WITHOUT CARDIOVERSION N/A 07/30/2021   Procedure: TRANSESOPHAGEAL ECHOCARDIOGRAM (TEE);  Surgeon: BJanina Mayo MD;  [place]  Service: Cardiovascular;  Laterality: N/A;  Patient Active Problem List   Diagnosis Date Noted   Transaminitis 08/14/2022   Ataxia 08/14/2022   RSV (respiratory syncytial virus pneumonia) 08/13/2022   Gait disturbance 08/13/2022   Secondary hypercoagulable state ([place]) 07/08/2022   Atypical atrial flutter (HCC)    Typical atrial flutter ([place]) 02/23/2017   Rupture of distal biceps tendon, left, initial encounter 12/23/2016   Multiple lung nodules on CT 03/20/2016   Cough 03/10/2016   Nausea 11/22/2015   PAF  (paroxysmal atrial fibrillation) ([place]) 08/20/2015   Hx of colonic polyps 05/14/2014   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/04/2013   VT (ventricular tachycardia) ([place]) 09/04/2013   V tach ([place]) 08/18/2013   Hypertrophic cardiomyopathy ([place])    Heart murmur    Mitral regurgitation    GERD (gastroesophageal reflux disease)     ONSET DATE: 08/15/2022 (date of referral)  REFERRING DIAG: R26 Gait abnormality   THERAPY DIAG:  Unsteadiness on feet  Abnormality of gait and mobility  Rationale for Evaluation and Treatment: Rehabilitation  SUBJECTIVE:                                                                                                                                                                                             SUBJECTIVE STATEMENT: Patient had his cardioversion on Tuesday and is back in rhythm per patient. He was started on Pepcid. He reports he felt like he improved really fast all at once with HEP and has started to plateau. He is doing well. Denies falls since last visit. Patient reports he feels most unsteady when picking up objects off floor and finds himself stumbling forward.   Pt accompanied by: self  PERTINENT HISTORY:  Per 08/13/2022 Hospital note: "Mr. Bruington is a 77 y.o. 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 In the ER, he also described symptoms of ataxia, generalized weakness, and nightmares, and so Neurology were consulted.Encephalopathy ruled out.  MRI brain, cervical and lumbar spine without contrast was motion degraded but otherwise within normal limits." Patient symptoms resolved within 30 hours of steroid ending per notes  Per chart Progress note on 08/15/2022: "Patient was "positive for RSV. He was previously prescribed antibiotics and steroids for his respiratory infection. He saw his PCP on 11/27 and took the steroids  and antibiotics for 2 days (stopped 11/29) due to nightmares, anxiety, and abnormal gait.... Patient/wife initially reported last dose of steroid sever days prior to admission to attending MD, but this has been clarified and confirmed to have been taken day of presentation last."  Atypical atrial flutter Cardioversion scheduled for 09/15/2022  PAIN:  Are you having pain? No  PRECAUTIONS: Fall; mask for RSV (donned throughout session)  WEIGHT BEARING RESTRICTIONS: No  FALLS: Has patient fallen in last 6 months? Yes, Reports x1 with reaction to steroid but denies injuries, reports multiple near falls within the last 6 months, cannot recall specific amounts, uses UE to steady.   LIVING ENVIRONMENT: Lives with: lives with their spouse Lives in: House/apartment Stairs:  2 steps in the garage- currently no railing but plans to add soon; and about 14 steps into attic with railing Has following equipment at home: None  PLOF: Independent  PATIENT GOALS: Patient reports, "I would like to stand up straight." "Improve balance."  OBJECTIVE:   There Act: Hip hinging while picking up objects off floor with cue for weight in feet to prevent falling; 2 x 10 picking up klenex from floor, 2 x 10 picking up 10# med ball from floor (SBA)  NMR:  Single Leg Stance:   Surface: Floor  Lower Extremity: RLE and LLE  Time: 16 secs on LLE:  14 secs on RLE  SOT:   Condition 1: 3/3  Condition 2: 3/3  Condition 3: 3/3  Condition 4: 3/3  Condition 5: 1/3; 0 Falls  Condition 6: 2/3; 1 Fall Results: Operating above age reported norms; improved from 51 to 70 from last assessment on 09/10/2022   PATIENT EDUCATION: Education details: Medbridge HEP - see above  Person educated: Patient Education method: Explanation Education comprehension: verbalized understanding and needs further education  HOME EXERCISE PROGRAM: Access Code: [email or web address] URL: [email or web address] Date:  09/24/2022 Prepared by: Malachi Carl  Exercises - Tandem Stance with Support  - 1 x daily - 7 x weekly - 1 sets - 2 reps - 30 sec hold - Single Leg Stance with Support  - 1 x daily - 7 x weekly - 2 sets - 1-2 reps - 10 sec hold - standing with feet apart on pillow  - 1 x daily - 7 x weekly - 1 sets - 1 reps - Romberg Stance Eyes Closed on Foam Pad  - 1 x daily - 7 x weekly - 3 sets - 10 reps - Standing Hip Hinge with Dowel  - 1 x daily - 7 x weekly - 3 sets - 10 reps  GOALS: Goals reviewed with patient? Yes  SHORT TERM GOALS: Target date: 09/14/2022  Patient will demonstrate 100% compliance with initial HEP to continue to progress between physical therapy sessions.   Baseline: Updated on 1/1//2024 Goal status: Ongoing - HEP initiated on 09-10-22  2.  Patient will demonstrate forward gait with head turns up and down without reports of feeling off balance and without gait deviations to improve overall sense of balance (FGA score for this component 3/3) Baseline: Initial: 2/3 on FGA Goal status: Ongoing - pt reports more dizziness provoked with vertical head turns than with horizontal head turns - 09-17-22  3.  SOT goal Baseline: To be assessed Goal status: Goal deferred as composite score is only 1 point decreased from Normal    LONG TERM GOALS: Target date: 10/12/2022   Patient will report demonstrate independence with final HEP in order to maintain current gains and continue to progress after physical therapy discharge.   Baseline: To be provided Goal status: INITIAL  2.  Patient will improve FGA to greater than 22/30 to indicate a decreased risk of falls and improved dynamic stability.   Baseline: 20/30 Goal status: INITIAL  3.  SOT goal to be assessed Baseline: To be assessed Goal status: Goal deferred due to initial high score of SOT  - 09-10-22 ASSESSMENT:  CLINICAL IMPRESSION: PT session emphasized balance with functional lifting and pickup tasks as start of session.  Patient reported history of imbalance with this task so instructed on hip hinging and weight shift into heels to improve stability. Patient demonstrated good safety with instruction during session; updated HEP with hip hinging task for reinforcement at home. Reassessed SOT an patient's balalnce now above age matched norms; improved form 78 (below age matched norms) to 15 (above age matched norms) in today's session demonstrating improved integration of vestibular, proprioceptive, and visual system. Finished session with SLB which patient improved in duration after 2 trials. Patient demonstrated good sensory integration as demonstrated by Cont with POC with plan to discharge next session with updated HEP.   OBJECTIVE IMPAIRMENTS: Abnormal gait, decreased balance, and decreased strength.   ACTIVITY LIMITATIONS: bending  PARTICIPATION LIMITATIONS: yard work  PERSONAL FACTORS: Age and Past/current experiences are also affecting patient's functional outcome.   REHAB POTENTIAL: Excellent  CLINICAL DECISION MAKING: Stable/uncomplicated  EVALUATION COMPLEXITY: Low  PLAN:  PT FREQUENCY: 1x/week  PT DURATION: 6 weeks  PLANNED INTERVENTIONS: Therapeutic exercises, Therapeutic activity, Neuromuscular re-education, Balance training, Gait training, Patient/Family education, Vestibular training, and Canalith repositioning  PLAN FOR NEXT SESSION:  Discharge; provide printouts of SOT balance improvements  Malachi Carl, PT, DPT  09/24/2022, 1:12 PM

## 2022-09-24 NOTE — Telephone Encounter (Deleted)
Called patient and LVM about the call the front desk had received about patient canceling appointments due to concerns about co-pay costs. Received callback from patient and patient's son who expressed interest in keeping the eval /POC open as they are deciding if they want to schedule more visits as a family. They inform therapist that they will call back with their decision.   Malachi Carl, PT, DPT

## 2022-09-24 NOTE — Progress Notes (Signed)
Remote ICD transmission.   

## 2022-09-28 ENCOUNTER — Telehealth: Payer: Self-pay

## 2022-09-28 NOTE — Telephone Encounter (Signed)
Alert received from [employer]:  Device alert for VP>limit, currently 49% - route to triage Upward trend since Oct Recent DCCV, presenting AP/VP

## 2022-09-29 ENCOUNTER — Other Ambulatory Visit (HOSPITAL_COMMUNITY): Payer: Self-pay | Admitting: Physician Assistant

## 2022-09-29 DIAGNOSIS — M8589 Other specified disorders of bone density and structure, multiple sites: Secondary | ICD-10-CM | POA: Diagnosis not present

## 2022-09-30 ENCOUNTER — Encounter: Payer: Self-pay | Admitting: Diagnostic Neuroimaging

## 2022-09-30 ENCOUNTER — Ambulatory Visit (INDEPENDENT_AMBULATORY_CARE_PROVIDER_SITE_OTHER): Payer: Medicare Other | Admitting: Diagnostic Neuroimaging

## 2022-09-30 VITALS — BP 106/61 | HR 52 | Ht 70.0 in | Wt 180.0 lb

## 2022-09-30 DIAGNOSIS — R531 Weakness: Secondary | ICD-10-CM

## 2022-09-30 DIAGNOSIS — F19950 Other psychoactive substance use, unspecified with psychoactive substance-induced psychotic disorder with delusions: Secondary | ICD-10-CM | POA: Diagnosis not present

## 2022-09-30 NOTE — Progress Notes (Signed)
[facility]  PATIENT: Joseph Morrow DOB: March 12, 1946  REFERRING CLINICIAN: Lorenza Chick, MD HISTORY FROM: patient  REASON FOR VISIT: new consult    HISTORICAL  CHIEF COMPLAINT:  Chief Complaint  Patient presents with   New Patient (Initial Visit)    Patient in room #6 with his wife. Patient here today to discuss ataxia.    HISTORY OF PRESENT ILLNESS:   77 year old male here for evaluation of gait difficulty, ataxia, word finding difficulties and short-term memory loss.  November 2023 patient had RSV, was treated with steroids but then developed delusions, anxiety, weakness.  Patient was admitted to the hospital for evaluation.  Extensive workup was obtained including imaging, lumbar puncture, lab testing.  Eventually his symptoms were attributed to steroid side effect.  Symptoms gradually improved during hospitalization and since that time have continued to improve back to baseline.   REVIEW OF SYSTEMS: Full 14 system review of systems performed and negative with exception of: as per HPI.  ALLERGIES: Allergies  Allergen Reactions   Prednisone Anxiety    Extreme anxiety, hallucinations   Dextromethorphan Other (See Comments)    nervous   Meclizine     Caused increased heart rate and constipation   Versed [Midazolam] Other (See Comments)    "hallucinations, headache"   Zofran [Ondansetron]     Caused increased heart rate and constipation   Antihistamines, Diphenhydramine-Type Anxiety    HOME MEDICATIONS: Outpatient Medications Prior to Visit  Medication Sig Dispense Refill   acetaminophen (TYLENOL) 500 MG tablet Take 500-1,000 mg by mouth every 6 (six) hours as needed (pain.).     amiodarone (PACERONE) 200 MG tablet Take 1 tablet (200 mg total) by mouth daily. 90 tablet 1   apixaban (ELIQUIS) 5 MG TABS tablet TAKE 1 TABLET BY MOUTH TWICE A DAY 60 tablet 5   calcium carbonate (OSCAL) 1500 (600 Ca) MG TABS tablet Take 600 mg by  mouth in the morning and at bedtime.     cholecalciferol (VITAMIN D3) 25 MCG (1000 UNIT) tablet Take 1,000 Units by mouth in the morning and at bedtime.     Coenzyme Q10 (CO Q 10) 100 MG CAPS Take 100 mg by mouth in the morning.     diltiazem (CARDIZEM) 30 MG tablet Cardizem '30mg'$  -- take 1 tablet every 4 hours AS NEEDED for heart rate >100 30 tablet 3   famotidine (PEPCID) 10 MG tablet Take 10 mg by mouth daily.     furosemide (LASIX) 20 MG tablet Take 1 tablet by mouth daily until 11/16 then only as needed for swelling/weight gain 30 tablet 11   Glucosamine-Chondroitin (COSAMIN DS PO) Take 1 tablet by mouth in the morning.     omeprazole (PRILOSEC OTC) 20 MG tablet Take 10 mg by mouth in the morning.     potassium citrate (UROCIT-K) 10 MEQ (1080 MG) SR tablet Take 1 tablet (10 mEq total) by mouth daily as needed (with lasix (swelling/weight gain)). 30 tablet 11   Probiotic Product (ALIGN PO) Take 1 capsule by mouth in the morning.     Zinc 50 MG CAPS Take 50 mg by mouth 2 (two) times daily.      metoprolol succinate (TOPROL-XL) 50 MG 24 hr tablet TAKE 1 TABLET TWICE A DAY WITH OR IMMEDIATELY FOLLOWING A MEAL 180 tablet 3   No facility-administered medications prior to visit.    PAST MEDICAL HISTORY: Past Medical History:  Diagnosis Date   Acute cholecystitis 06/21/2011   Allergy  SEASONAL   Cataract    Complication of anesthesia    HALLUCINATIONS   ED (erectile dysfunction)    GERD (gastroesophageal reflux disease)    Hearing loss    Helicobacter pylori gastritis 06/2014   Hemorrhoids    Hiatal hernia    Hx of adenomatous colonic polyps 06/2014   Hypertrophic cardiomyopathy (HCC)    Mitral regurgitation    Moderate   Osteopenia    Paroxysmal atrial fibrillation ([place]) 04/2015   documented on ICD remote interrogation, treated with sotalol   Septal defect    LV Septal hyptertrophy 69m, normal EF, minimal LVOT gradiant.   Tubular adenoma of colon    VT (ventricular  tachycardia) ([place] 08/18/2013   s/p St. Jude ICD    PAST SURGICAL HISTORY: Past Surgical History:  Procedure Laterality Date   APPENDECTOMY  1950   ATRIAL FIBRILLATION ABLATION N/A 07/04/2020   Procedure: ATRIAL FIBRILLATION ABLATION;  Surgeon: AThompson Grayer MD;  [facility];  Service: Cardiovascular;  Laterality: N/A;   ATRIAL FIBRILLATION ABLATION N/A 09/18/2021   Procedure: ATRIAL FIBRILLATION ABLATION;  Surgeon: LVickie Epley MD;  [facility];  Service: Cardiovascular;  Laterality: N/A;   CARDIOVERSION N/A 02/25/2017   Procedure: CARDIOVERSION;  Surgeon: RFay Records MD;  [facility];  Service: Cardiovascular;  Laterality: N/A;   CARDIOVERSION N/A 07/30/2021   Procedure: CARDIOVERSION;  Surgeon: BJanina Mayo MD;  [facility]: [facility]  Service: Cardiovascular;  Laterality: N/A;   CARDIOVERSION N/A 04/27/2022   Procedure: CARDIOVERSION;  Surgeon: SJerline Pain MD;  [facility]: [facility]  Service: Cardiovascular;  Laterality: N/A;   CARDIOVERSION N/A 09/15/2022   Procedure: CARDIOVERSION;  Surgeon: CWerner Lean MD;  [facility]  Service: Cardiovascular;  Laterality: N/A;   CATARACT EXTRACTION, BILATERAL     CHOLECYSTECTOMY  06/24/2011   COLONOSCOPY     EP IMPLANTABLE DEVICE N/A 09/03/2016   SJM Fortify [address] Dr generator change by Dr ARayann Heman  ICD implanted for secondary prevention of sudden death   IMPLANTABLE CARDIOVERTER DEFIBRILLATOR IMPLANT  08/18/2013   St. Jude Fortify [address] DR ICD implanted by Dr ARayann Hemanfor secondary treatment of VT   IMPLANTABLE CARDIOVERTER DEFIBRILLATOR IMPLANT N/A 08/18/2013   Procedure: IMPLANTABLE CARDIOVERTER DEFIBRILLATOR IMPLANT;  Surgeon: JCoralyn Mark MD;  [facility];  Service: Cardiovascular;  Laterality: N/A;   TEE WITHOUT CARDIOVERSION N/A 07/30/2021   Procedure: TRANSESOPHAGEAL ECHOCARDIOGRAM (TEE);  Surgeon: BJanina Mayo MD;  [facility];  Service: Cardiovascular;  Laterality: N/A;    FAMILY HISTORY: Family History  Problem Relation Age of Onset   Hypertrophic cardiomyopathy Mother    Arrhythmia Mother    Heart failure Mother    Pneumonia Father    Diabetes Father    Colon polyps Father    Heart attack Brother    Sleep apnea Brother    Hypertrophic cardiomyopathy Brother    Heart Problems Brother        VAD   Arrhythmia Brother    Heart Problems Brother    Diabetes Brother    Sudden death Brother    Lung disease Neg Hx     SOCIAL HISTORY: Social History   Socioeconomic History   Marital status: Married    Spouse name: Not on file   Number of children: 2   Years of education: Not on file   Highest education level: Not on file  Occupational History   Occupation: retired  Tobacco Use  Smoking status: Former    Packs/day: 2.00    Years: 20.00    Total pack years: 40.00    Types: Cigarettes    Quit date: 07/06/1981    Years since quitting: 41.2   Smokeless tobacco: Never   Tobacco comments:    Former smoker 10/21/21  Vaping Use   Vaping Use: Never used  Substance and Sexual Activity   Alcohol use: Not Currently    Comment: stop drinking 2 glasses of wine since ablation 10/21/21   Drug use: No   Sexual activity: Not on file  Other Topics Concern   Not on file  Social History Narrative   Originally from [place]. Moved to [place] in 2012. Currently has no pets. No bird or hot tub exposure. Previously worked running [place]. He also worked as a Air cabin crew. He did have exposure to Beneze, Acetone, Styrene, & Hydrocarbon early in his career.   Social Determinants of Health   Financial Resource Strain: Not on file  Food Insecurity: No Food Insecurity (08/14/2022)   Hunger Vital Sign    Worried About Running Out of Food in the Last Year: Never true    Ran Out of Food in the Last Year: Never true  Transportation Needs: No Transportation Needs (08/14/2022)   PRAPARE - Armed forces logistics/support/administrative officer (Medical): No    Lack of Transportation (Non-Medical): No  Physical Activity: Not on file  Stress: Not on file  Social Connections: Not on file  Intimate Partner Violence: Not At Risk (08/14/2022)   Humiliation, Afraid, Rape, and Kick questionnaire    Fear of Current or Ex-Partner: No    Emotionally Abused: No    Physically Abused: No    Sexually Abused: No     PHYSICAL EXAM  GENERAL EXAM/CONSTITUTIONAL: Vitals:  Vitals:   09/30/22 1136  BP: 106/61  Pulse: (!) 52  Weight: 180 lb (81.6 kg)  Height: '5\' 10"'$  (1.778 m)   Body mass index is 25.83 kg/m. Wt Readings from Last 3 Encounters:  10/01/22 181 lb (82.1 kg)  09/30/22 180 lb (81.6 kg)  09/15/22 173 lb (78.5 kg)   Patient is in no distress; well developed, nourished and groomed; neck is supple  CARDIOVASCULAR: Examination of carotid arteries is normal; no carotid bruits Regular rate and rhythm, no murmurs Examination of peripheral vascular system by observation and palpation is normal  EYES: Ophthalmoscopic exam of optic discs and posterior segments is normal; no papilledema or hemorrhages No results found.  MUSCULOSKELETAL: Gait, strength, tone, movements noted in Neurologic exam below  NEUROLOGIC: MENTAL STATUS:      No data to display         awake, alert, oriented to person, place and time recent and remote memory intact normal attention and concentration language fluent, comprehension intact, naming intact fund of knowledge appropriate  CRANIAL NERVE:  2nd - no papilledema on fundoscopic exam 2nd, 3rd, 4th, 6th - pupils equal and reactive to light, visual fields full to confrontation, extraocular muscles intact, no nystagmus 5th - facial sensation symmetric 7th - facial strength symmetric 8th - hearing intact 9th - palate elevates symmetrically, uvula midline 11th - shoulder shrug symmetric 12th - tongue protrusion midline  MOTOR:  normal bulk and tone, full strength  in the BUE, BLE  SENSORY:  normal and symmetric to light touch, pinprick, temperature, vibration  COORDINATION:  finger-nose-finger, fine finger movements normal  REFLEXES:  deep tendon reflexes present and symmetric  GAIT/STATION:  narrow based gait;  able to walk on toes, heels and tandem; romberg is negative     DIAGNOSTIC DATA (LABS, IMAGING, TESTING) - I reviewed patient records, labs, notes, testing and imaging myself where available.  Lab Results  Component Value Date   WBC 7.0 09/15/2022   HGB 16.0 09/15/2022   HCT 49.6 09/15/2022   MCV 91.0 09/15/2022   PLT 183 09/15/2022      Component Value Date/Time   NA 137 10/01/2022 0936   NA 139 09/10/2021 1111   K 4.6 10/01/2022 0936   CL 104 10/01/2022 0936   CO2 25 10/01/2022 0936   GLUCOSE 116 (H) 10/01/2022 0936   BUN 19 10/01/2022 0936   BUN 18 09/10/2021 1111   CREATININE 1.44 (H) 10/01/2022 0936   CALCIUM 9.1 10/01/2022 0936   PROT 5.0 (L) 08/15/2022 0343   ALBUMIN 2.6 (L) 08/15/2022 0343   ALBUMIN 3.1 (L) 08/14/2022 1112   AST 26 08/15/2022 0343   ALT 34 08/15/2022 0343   ALKPHOS 59 08/15/2022 0343   BILITOT 0.8 08/15/2022 0343   GFRNONAA 50 (L) 10/01/2022 0936   GFRAA 63 06/12/2020 1645   No results found for: "CHOL", "HDL", "LDLCALC", "LDLDIRECT", "TRIG", "CHOLHDL" No results found for: "HGBA1C" Lab Results  Component Value Date   VITAMINB12 835 08/13/2022   Lab Results  Component Value Date   TSH 0.904 07/08/2022        Component Ref Range & Units 1 mo ago  Vitamin E (Alpha Tocopherol) 9.0 - 29.0 mg/L 8.5 Low           Component Ref Range & Units 1 mo ago  Vitamin B1 (Thiamine) 66.5 - 200.0 nmol/L 146.6          Component Ref Range & Units 1 mo ago  Tube #  4  Color, CSF COLORLESS COLORLESS  Appearance, CSF CLEAR CLEAR  Supernatant  NOT INDICATED  RBC Count, CSF 0 /cu mm 0  WBC, CSF 0 - 5 /cu mm 2  Other Cells, CSF  TOO FEW TO COUNT, SMEAR AVAILABLE FOR REVIEW         Component Ref Range & Units 1 mo ago  Cryptococcus neoformans/gattii (CSF) NOT DETECTED NOT DETECTED  Comment: (NOTE) Patients with a suspicion of cryptococcal meningitis should be tested for cryptococcal antigen (CrAg).   Cytomegalovirus (CSF) NOT DETECTED NOT DETECTED  Enterovirus (CSF) NOT DETECTED NOT DETECTED  Escherichia coli K1 (CSF) NOT DETECTED NOT DETECTED  Comment: (NOTE) Only E. coli strains possessing the K1 capsular antigen will be detected.   Haemophilus influenzae (CSF) NOT DETECTED NOT DETECTED  Herpes simplex virus 1 (CSF) NOT DETECTED NOT DETECTED  Herpes simplex virus 2 (CSF) NOT DETECTED NOT DETECTED  Human herpesvirus 6 (CSF) NOT DETECTED NOT DETECTED  Human parechovirus (CSF) NOT DETECTED NOT DETECTED  Listeria monocytogenes (CSF) NOT DETECTED NOT DETECTED  Neisseria meningitis (CSF) NOT DETECTED NOT DETECTED  Comment: (NOTE) Only encapsulated strains of N. meningitidis will be detected.   Streptococcus agalactiae (CSF) NOT DETECTED NOT DETECTED  Streptococcus pneumoniae (CSF) NOT DETECTED NOT DETECTED  Varicella zoster virus (CSF) NOT DETECTED NOT DETECTED                Component Ref Range & Units 1 mo ago (08/14/22) 1 mo ago (08/13/22) 2 mo ago (07/08/22) 6 yr ago (11/22/15) 9 yr ago (08/18/13) 11 yr ago (06/24/11) 11 yr ago (06/23/11)  IgG, CSF 0.0 - 10.3 mg/dL 5.0        Albumin CSF-mCnc 15 -  55 mg/dL 40        IgG (Immunoglobin G), Serum 603 - 1,613 mg/dL 819        Albumin 3.8 - 4.8 g/dL 3.1 Low  3.3 Low  R 3.5 R 3.9 R 4.0 R 2.8 Low  R 2.6 Low  R  IgG/Alb Ratio, CSF 0.00 - 0.25 0.13        CSF IgG Index 0.0 - 0.7 0.5               Component Ref Range & Units 1 mo ago  Glucose, CSF 40 - 70 mg/dL 66  Total  Protein, CSF 15 - 45 mg/dL 58 High       Component 1 mo ago  CSF Oligoclonal Bands Comment  Comment: (NOTE) Zero (0) oligoclonal bands were observed in the CSF.         Component Ref Range & Units 1 mo ago  VZV PCR, CSF Negative  Negative         Component Ref Range & Units 1 mo ago  HSV-1 DNA Negative Negative  HSV-2 DNA Negative Negative      08/14/22 autoimmune encephalopathy panel --> negative  08/13/22 MRI brain [I reviewed images myself and agree with interpretation. -VRP]  1. No acute intracranial abnormality. 2. Mild cerebral atrophy.  08/13/22 MRI cervical spine 1. Motion degraded study. 2. Cervical spondylosis. Mild foraminal narrowing bilaterally at C4-5, C5-6, C6-7 due to spurring.    ASSESSMENT AND PLAN  77 y.o. year old male here with:   Dx:  1. Steroid-induced psychosis, with delusions ([place])   2. Weakness     PLAN:  STEROID ENCEPHALOPATHY / WEAKNESS (now resolved) - monitor symptoms; doing well - daily physical activity / exercise (at least 15-30 minutes) - eat more plants / vegetables - increase social activities, brain stimulation, games, puzzles, hobbies, crafts, arts, music - aim for at least 7-8 hours sleep per night (or more)  Return for pending if symptoms worsen or fail to improve, return to PCP.    Penni Bombard, MD 6/94/8546, 2:70 PM Certified in Neurology, Neurophysiology and Neuroimaging  [facility] [address] [place], [place] [address] [phone]

## 2022-09-30 NOTE — Patient Instructions (Addendum)
-  monitor symptoms; doing well - daily physical activity / exercise (at least 15-30 minutes) - eat more plants / vegetables - increase social activities, brain stimulation, games, puzzles, hobbies, crafts, arts, music - aim for at least 7-8 hours sleep per night (or more)

## 2022-10-01 ENCOUNTER — Encounter (HOSPITAL_COMMUNITY): Payer: Self-pay | Admitting: Physician Assistant

## 2022-10-01 ENCOUNTER — Ambulatory Visit (HOSPITAL_COMMUNITY)
Admission: RE | Admit: 2022-10-01 | Discharge: 2022-10-01 | Disposition: A | Discharge status: Home / Self Care | Payer: Medicare Other | Source: Ambulatory Visit | Attending: Physician Assistant | Admitting: Physician Assistant

## 2022-10-01 VITALS — BP 114/80 | HR 53 | Ht 70.0 in | Wt 181.0 lb

## 2022-10-01 DIAGNOSIS — Z95810 Presence of automatic (implantable) cardiac defibrillator: Secondary | ICD-10-CM | POA: Diagnosis not present

## 2022-10-01 DIAGNOSIS — Z8249 Family history of ischemic heart disease and other diseases of the circulatory system: Secondary | ICD-10-CM | POA: Insufficient documentation

## 2022-10-01 DIAGNOSIS — Z87891 Personal history of nicotine dependence: Secondary | ICD-10-CM | POA: Diagnosis not present

## 2022-10-01 DIAGNOSIS — I480 Paroxysmal atrial fibrillation: Secondary | ICD-10-CM | POA: Diagnosis not present

## 2022-10-01 DIAGNOSIS — I422 Other hypertrophic cardiomyopathy: Secondary | ICD-10-CM | POA: Diagnosis not present

## 2022-10-01 DIAGNOSIS — I484 Atypical atrial flutter: Secondary | ICD-10-CM

## 2022-10-01 LAB — BASIC METABOLIC PANEL
Anion gap: 8 (ref 5–15)
BUN: 19 mg/dL (ref 8–23)
CO2: 25 mmol/L (ref 22–32)
Calcium: 9.1 mg/dL (ref 8.9–10.3)
Chloride: 104 mmol/L (ref 98–111)
Creatinine, Ser: 1.44 mg/dL — ABNORMAL HIGH (ref 0.61–1.24)
GFR, Estimated: 50 mL/min — ABNORMAL LOW (ref >60)
Glucose, Bld: 116 mg/dL — ABNORMAL HIGH (ref 70–99)
Potassium: 4.6 mmol/L (ref 3.5–5.1)
Sodium: 137 mmol/L (ref 135–145)

## 2022-10-01 MED ORDER — METOPROLOL SUCCINATE ER 50 MG PO TB24: 50.0000 mg | ORAL_TABLET | Freq: Every day | ORAL | 3 refills | Status: DC

## 2022-10-01 NOTE — Progress Notes (Signed)
Primary Care Physician: Ginger Organ., MD Referring Physician:Dr. Quentin Ore  EP: Dr. Lesia Sago Joseph Morrow is a 77 y.o. male with a h/o hypertrophic cardiomyopathy, s/p ICD for VT, remote paroxysmal afib, maintaining SR with a very small afib burden on Sotalol. Hospitalized for sotalol administration 02/2017. Chadsvasc score of 2(age, LV dysfunction), continues on eliquis 5 mg bid.  Qtc stable.   He asked to be seen today for afib noted yesterday. Interrogation of ICD/PPM confirms afib on 2/10 and 2/11, mostly starting 2/10 at 11:54 pm and continuing to 5:03 am, 2/11. He states that he drinks alcohol nightly and he often takes his sotalol 14 hours apart. Otherwise, no change from his usual health. Denies snoring. He continues on sotalol.  F/u in afib clinic, 02/14/20. I am seeing him as the device clinic noted onset of atrial flutter, rate controlled since  5/30. In the office he continues in rate controlled aflutter. He is mildly symptomatic with this. He continues on sotalol 120 mg bid, and is compliant. He has 2 alcoholic drinks a week. He had been on BB in the past . Stopped when sotalol loaded. Continues on eliquis 5 mg bid for a CHA2DS2VASc score of 3.   F/u in afib clinic, 03/11/20, as an urgent work-in as he noted increase in HR last night via Fitbit and confirmed with [name]. If not for seeing his HR elevated, he would not have felt  differently out of rhythm. He is on sotalol 120 mg bid and has been on this for several years. He saw Dr. Rayann Heman in June , was doing ok, but it was mentioned in his note, that if afib burden increased, he could consider ablation.  No significant change in health recently. No alcohol, caffeine, tobacco.  By  by the device clinic report this am, he appears to be in an atrial flutter at 117 bpm. BP on arrival 88/70. With a glass of water, BP came up to 104/70. The only medicine he is on is 12.5 metoprolol succinate that would affect BP. This was  added in June on an occasion  when he was out of rhythm. He will usually convert himself.   F/u in afib clinic, 07/18/20. He called the clinic earlier in the week, that he was out of rhythm. He does report that he missed all meds Friday pm and went out of rhythm Saturday pm, including his DOAC. He is in an atrial  Flutter, rate controlled. Reading his ablation OP report, he did  have at least 3 flutter circuits that could not be mapped or ablated. No swallowing or groin issues.  F/u in afib clinic, 11/11. He has returned to [place]. Ekg shows sinus brady at 57 bpm, with first degree AV block.  PR int 236 ms, qrs int 66 ms, qtc 428 ms. He feels well.   F/u 08/02/20. He is now one month out from  ablation and has been in [place] for the last 2 weeks. He feels improved. No issues today.   F/u afib clinic 07/24/21. He went out of rhythm  over the weekend. He has noted more exertional dyspnea and fatigue with this episode. He had vertigo last week with dizziness and N/V with PCP treating with Antivert. It is now better. He has atrial flutter with RVR at 111 bpm.   F/u 08/04/21. He is in the clinic as he went out of rhythm yesterday with RVR at times. He hs noted some H/A at times since  being out of rhythm. He just had a successful cardioversion 11/16 but now with ERAF. His ablation was last October.  Because of hypertrophic cardiomyopathy, his best drug choice would be amiodarone if sotalol was stopped.   F/u 10/21/21 for one month s/p ablation. He is maintaining  SR. He feels well. He had a bruise left thigh but it is resolved . No swallowing issues. Remains on sotalol and metoprolol . No issues with anticoagultion.   F/u in the afib clinic, 04/20/22. He has had ongoing aflutter with controlled rates since 7/15 as notified by the device clinic. He is minimally symptomatic with this. No missed anticoagulation. Only trigger that pt  reports is that he developed vertigo and took several doses of meclizine, he developed  severe constipation after this, now resolved. He is in agreement to  schedule for cardioversion.  F/u cardioversion, 05/04/22. He had a successful cardioversion 8/14 and is now back for f/u. EKG shows sinus brady with first degree block at 56 bpm.  Follow up in the AF clinic 07/08/22. The device clinic received an alert for an ongoing afib episode starting 10/19. He has noted a little more SOB going up stairs and some mild lower extremity edema. There were no specific triggers for his afib that he could identify.   Follow up in the AF clinic 08/28/22. Patient was started on amiodarone 07/08/22 with plan for DCCV after loading. He was admitted 08/13/22 with acute muscle weakness, gait disturbance, and vivid drams/hallucinations. After neurologic workup, this was felt to be steroid induced. He remains in atrial flutter today with symptoms of dyspnea on exertion.   Follow up in the AF clinic 10/01/22. Patient is s/p DCCV on 09/15/22. Patient remains in [place], feeling well. His heart rate has been consistently in the 50s. He did notice a very small amount of blood on the tissue paper after a bowel movement. He did have a GI bleed ~20 years ago. He follows with Dr Hilarie Fredrickson of GI.   Today, he denies symptoms of  palpitations, chest pain, orthopnea, PND, dizziness, presyncope, syncope, or neurologic sequela.  Positive for palpitations..The patient is tolerating medications without difficulties and is otherwise without complaint today.   Past Medical History:  Diagnosis Date   Acute cholecystitis 06/21/2011   Allergy    SEASONAL   Cataract    Complication of anesthesia    HALLUCINATIONS   ED (erectile dysfunction)    GERD (gastroesophageal reflux disease)    Hearing loss    Helicobacter pylori gastritis 06/2014   Hemorrhoids    Hiatal hernia    Hx of adenomatous colonic polyps 06/2014   Hypertrophic cardiomyopathy (HCC)    Mitral regurgitation    Moderate   Osteopenia    Paroxysmal atrial fibrillation  ([place]) 04/2015   documented on ICD remote interrogation, treated with sotalol   Septal defect    LV Septal hyptertrophy 15m, normal EF, minimal LVOT gradiant.   Tubular adenoma of colon    VT [place]ricular tachycardia) [place] 08/18/2013   s/p St. Jude ICD   Past Surgical History:  Procedure Laterality Date   APPENDECTOMY  1950   ATRIAL FIBRILLATION ABLATION N/A 07/04/2020   Procedure: ATRIAL FIBRILLATION ABLATION;  Surgeon: AThompson Grayer MD;  [facility];  Service: Cardiovascular;  Laterality: N/A;   ATRIAL FIBRILLATION ABLATION N/A 09/18/2021   Procedure: ATRIAL FIBRILLATION ABLATION;  Surgeon: LVickie Epley MD;  [facility];  Service: Cardiovascular;  Laterality: N/A;   CARDIOVERSION N/A  02/25/2017   Procedure: CARDIOVERSION;  Surgeon: Fay Records, MD;  Location[facility] ENDOSCOPY;  Service: Cardiovascular;  Laterality: N/A;   CARDIOVERSION N/A 07/30/2021   Procedure: CARDIOVERSION;  Surgeon: Janina Mayo, MD;  Location: [place];  Service: Cardiovascular;  Laterality: N/A;   CARDIOVERSION N/A 04/27/2022   Procedure: CARDIOVERSION;  Surgeon: Jerline Pain, MD;  Location: [place];  Service: Cardiovascular;  Laterality: N/A;   CARDIOVERSION N/A 09/15/2022   Procedure: CARDIOVERSION;  Surgeon: Werner Lean, MD;  Location: [place];  Service: Cardiovascular;  Laterality: N/A;   CATARACT EXTRACTION, BILATERAL     CHOLECYSTECTOMY  06/24/2011   COLONOSCOPY     EP IMPLANTABLE DEVICE N/A 09/03/2016   SJM Fortify [address] Dr generator change by Dr Rayann Heman.  ICD implanted for secondary prevention of sudden death   IMPLANTABLE CARDIOVERTER DEFIBRILLATOR IMPLANT  08/18/2013   St. Jude Fortify [address] DR ICD implanted by Dr Rayann Heman for secondary treatment of VT   IMPLANTABLE CARDIOVERTER DEFIBRILLATOR IMPLANT N/A 08/18/2013   Procedure: IMPLANTABLE CARDIOVERTER DEFIBRILLATOR IMPLANT;  Surgeon: Coralyn Mark, MD;  [facility];   Service: Cardiovascular;  Laterality: N/A;   TEE WITHOUT CARDIOVERSION N/A 07/30/2021   Procedure: TRANSESOPHAGEAL ECHOCARDIOGRAM (TEE);  Surgeon: Janina Mayo, MD;  Location: [facility] ENDOSCOPY;  Service: Cardiovascular;  Laterality: N/A;    Current Outpatient Medications  Medication Sig Dispense Refill   acetaminophen (TYLENOL) 500 MG tablet Take 500-1,000 mg by mouth every 6 (six) hours as needed (pain.).     amiodarone (PACERONE) 200 MG tablet Take 1 tablet (200 mg total) by mouth daily. 90 tablet 1   apixaban (ELIQUIS) 5 MG TABS tablet TAKE 1 TABLET BY MOUTH TWICE A DAY 60 tablet 5   calcium carbonate (OSCAL) 1500 (600 Ca) MG TABS tablet Take 600 mg by mouth in the morning and at bedtime.     cholecalciferol (VITAMIN D3) 25 MCG (1000 UNIT) tablet Take 1,000 Units by mouth in the morning and at bedtime.     Coenzyme Q10 (CO Q 10) 100 MG CAPS Take 100 mg by mouth in the morning.     diltiazem (CARDIZEM) 30 MG tablet Cardizem '30mg'$  -- take 1 tablet every 4 hours AS NEEDED for heart rate >100 30 tablet 3   famotidine (PEPCID) 10 MG tablet Take 10 mg by mouth daily.     furosemide (LASIX) 20 MG tablet Take 1 tablet by mouth daily until 11/16 then only as needed for swelling/weight gain 30 tablet 11   Glucosamine-Chondroitin (COSAMIN DS PO) Take 1 tablet by mouth in the morning.     omeprazole (PRILOSEC OTC) 20 MG tablet Take 10 mg by mouth in the morning.     potassium citrate (UROCIT-K) 10 MEQ (1080 MG) SR tablet Take 1 tablet (10 mEq total) by mouth daily as needed (with lasix (swelling/weight gain)). 30 tablet 11   Probiotic Product (ALIGN PO) Take 1 capsule by mouth in the morning.     Zinc 50 MG CAPS Take 50 mg by mouth 2 (two) times daily.      metoprolol succinate (TOPROL-XL) 50 MG 24 hr tablet Take 1 tablet (50 mg total) by mouth daily. Take with or immediately following a meal. 90 tablet 3   No current facility-administered medications for this encounter.    Allergies  Allergen  Reactions   Prednisone Anxiety    Extreme anxiety, hallucinations   Dextromethorphan Other (See Comments)    nervous   Meclizine     Caused  increased heart rate and constipation   Versed [Midazolam] Other (See Comments)    "hallucinations, headache"   Zofran [Ondansetron]     Caused increased heart rate and constipation   Antihistamines, Diphenhydramine-Type Anxiety    Social History   Socioeconomic History   Marital status: Married    Spouse name: Not on file   Number of children: 2   Years of education: Not on file   Highest education level: Not on file  Occupational History   Occupation: retired  Tobacco Use   Smoking status: Former    Packs/day: 2.00    Years: 20.00    Total pack years: 40.00    Types: Cigarettes    Quit date: 07/06/1981    Years since quitting: 41.2   Smokeless tobacco: Never   Tobacco comments:    Former smoker 10/21/21  Vaping Use   Vaping Use: Never used  Substance and Sexual Activity   Alcohol use: Not Currently    Comment: stop drinking 2 glasses of wine since ablation 10/21/21   Drug use: No   Sexual activity: Not on file  Other Topics Concern   Not on file  Social History Narrative   Originally from [place]. Moved to [place] in 2012. Currently has no pets. No bird or hot tub exposure. Previously worked running [employer]. He also worked as a Air cabin crew. He did have exposure to Beneze, Acetone, Styrene, & Hydrocarbon early in his career.   Social Determinants of Health   Financial Resource Strain: Not on file  Food Insecurity: No Food Insecurity (08/14/2022)   Hunger Vital Sign    Worried About Running Out of Food in the Last Year: Never true    Ran Out of Food in the Last Year: Never true  Transportation Needs: No Transportation Needs (08/14/2022)   PRAPARE - Hydrologist (Medical): No    Lack of Transportation (Non-Medical): No  Physical Activity: Not on file  Stress: Not on file  Social  Connections: Not on file  Intimate Partner Violence: Not At Risk (08/14/2022)   Humiliation, Afraid, Rape, and Kick questionnaire    Fear of Current or Ex-Partner: No    Emotionally Abused: No    Physically Abused: No    Sexually Abused: No    Family History  Problem Relation Age of Onset   Hypertrophic cardiomyopathy Mother    Arrhythmia Mother    Heart failure Mother    Pneumonia Father    Diabetes Father    Colon polyps Father    Heart attack Brother    Sleep apnea Brother    Hypertrophic cardiomyopathy Brother    Heart Problems Brother        VAD   Arrhythmia Brother    Heart Problems Brother    Diabetes Brother    Sudden death Brother    Lung disease Neg Hx     ROS- All systems are reviewed and negative except as per the HPI above  Physical Exam: Vitals:   10/01/22 0919  BP: 114/80  Pulse: (!) 53  Weight: 82.1 kg  Height: '5\' 10"'$  (1.778 m)    Wt Readings from Last 3 Encounters:  10/01/22 82.1 kg  09/30/22 81.6 kg  09/15/22 78.5 kg    Labs: Lab Results  Component Value Date   NA 137 10/01/2022   K 4.6 10/01/2022   CL 104 10/01/2022   CO2 25 10/01/2022   GLUCOSE 116 (H) 10/01/2022   BUN 19 10/01/2022  CREATININE 1.44 (H) 10/01/2022   CALCIUM 9.1 10/01/2022   MG 1.8 04/20/2022   Lab Results  Component Value Date   INR 1.5 (H) 08/14/2022   No results found for: "CHOL", "HDL", "LDLCALC", "TRIG"   GEN- The patient is a well appearing elderly male, alert and oriented x 3 today.   HEENT-head normocephalic, atraumatic, sclera clear, conjunctiva pink, hearing intact, trachea midline. Lungs- Clear to ausculation bilaterally, normal work of breathing Heart- Regular rate and rhythm, bradycardia, no murmurs, rubs or gallops  GI- soft, NT, ND, + BS Extremities- no clubbing, cyanosis, or edema MS- no significant deformity or atrophy Skin- no rash or lesion Psych- euthymic mood, full affect Neuro- strength and sensation are intact   ECG today  demonstrates  SB, baseline ST-T changes, 1st degree AV block Vent. rate 53 BPM PR interval 332 ms QRS duration 74 ms QT/QTcB 422/395 ms   Echo 08/23/19  1. Hypertrophic cardiomyopathy, sigmoid subtype. Basal septum measures 18 mm in maximal dimension. Flow acceleration noted at the LVOT, no LVOT obstruction noted. Chordal systolic anterior motion of the mitral valve without involvment of the anterior leaflet.   2. Left ventricular ejection fraction, by visual estimation, is 60 to  65%. The left ventricle has normal function. There is severely increased  left ventricular hypertrophy.   3. Elevated left atrial and left ventricular end-diastolic pressures.   4. Left ventricular diastolic parameters are consistent with Grade III  diastolic dysfunction (restrictive).   5. Global right ventricle has normal systolic function.The right  ventricular size is normal. No increase in right ventricular wall  thickness.   6. Left atrial size was moderately dilated.   7. Right atrial size was moderately dilated.   8. The mitral valve is normal in structure. Mild mitral valve  regurgitation. No evidence of mitral stenosis.   9. The tricuspid valve is normal in structure. Tricuspid valve  regurgitation is mild.  10. The aortic valve is tricuspid. Aortic valve regurgitation is not  visualized. No evidence of aortic valve sclerosis or stenosis.  11. The pulmonic valve was normal in structure. Pulmonic valve  regurgitation is mild.  12. Moderately elevated pulmonary artery systolic pressure.  13. The inferior vena cava is dilated in size with <50% respiratory  variability, suggesting right atrial pressure of 15 mmHg.  14. The left ventricle has no regional wall motion abnormalities.     Assessment and Plan: 1. Atrial flutter  S/p ablation 06/2020 with redo ablation 09/2021   S/p DCCV 09/15/22 after loading on amiodarone. Patient in [place] with prolonged PR. Will decrease Toprol to 50 mg daily.  Continue  amiodarone 200 mg daily Continue Eliquis 5 mg bid for a CHA2DS2VASc score of at least 3  Patient states the blood after bowel movement was "almost too small to notice". Hgb WNL on 09/15/22. Instructed patient to call PCP or GI if there are any changes or issue continues. Would like for him to continue Eliquis without interruption for at least 4 weeks post DCCV.   2. HCM St Jude -Merlin ICD, followed by Dr Quentin Ore and the device clinic.   Follow up with Dr Quentin Ore per recall. AF clinic in 6 months.    [facility] [address] [place], [place] [address] [phone]

## 2022-10-01 NOTE — Patient Instructions (Signed)
Decrease metoprolol to '50mg'$  once a day

## 2022-10-02 ENCOUNTER — Ambulatory Visit: Payer: Medicare Other | Admitting: Physical Therapy

## 2022-10-02 ENCOUNTER — Encounter: Payer: Self-pay | Admitting: Physical Therapy

## 2022-10-02 VITALS — BP 115/71 | HR 80

## 2022-10-02 DIAGNOSIS — R269 Unspecified abnormalities of gait and mobility: Secondary | ICD-10-CM

## 2022-10-02 DIAGNOSIS — R2681 Unsteadiness on feet: Secondary | ICD-10-CM

## 2022-10-02 DIAGNOSIS — R42 Dizziness and giddiness: Secondary | ICD-10-CM | POA: Diagnosis not present

## 2022-10-02 NOTE — Therapy (Signed)
OUTPATIENT PHYSICAL THERAPY NEURO TREATMENT / DISCHARGE   Patient Name: Joseph Morrow MRN: [id number] DOB:October 31, 1945, 77 y.o., male Today's Date: 10/02/2022   PCP: PCP: Ginger Organ., MD REFERRING PROVIDER: Edwin Dada  END OF SESSION:  PT End of Session - 10/02/22 1127     Visit Number 5    Number of Visits 5    Date for PT Re-Evaluation 10/12/22    Authorization Type Medicare & BCBS    Progress Note Due on Visit 10    PT Start Time 1126    PT Stop Time 7253   treatment time shortened as patient completed all components for D/C   PT Time Calculation (min) 29 min    Equipment Utilized During Treatment Gait belt    Activity Tolerance Patient tolerated treatment well    Behavior During Therapy WFL for tasks assessed/performed               Past Medical History:  Diagnosis Date   Acute cholecystitis 06/21/2011   Allergy    SEASONAL   Cataract    Complication of anesthesia    HALLUCINATIONS   ED (erectile dysfunction)    GERD (gastroesophageal reflux disease)    Hearing loss    Helicobacter pylori gastritis 06/2014   Hemorrhoids    Hiatal hernia    Hx of adenomatous colonic polyps 06/2014   Hypertrophic cardiomyopathy (HCC)    Mitral regurgitation    Moderate   Osteopenia    Paroxysmal atrial fibrillation ([place]) 04/2015   documented on ICD remote interrogation, treated with sotalol   Septal defect    LV Septal hyptertrophy 60m, normal EF, minimal LVOT gradiant.   Tubular adenoma of colon    VT [place]ricular tachycardia) ([place] 08/18/2013   s/p St. Jude ICD   Past Surgical History:  Procedure Laterality Date   APPENDECTOMY  1950   ATRIAL FIBRILLATION ABLATION N/A 07/04/2020   Procedure: ATRIAL FIBRILLATION ABLATION;  Surgeon: AThompson Grayer MD;  [place] [facility] [place];  Service: Cardiovascular;  Laterality: N/A;   ATRIAL FIBRILLATION ABLATION N/A 09/18/2021   Procedure: ATRIAL FIBRILLATION ABLATION;  Surgeon: LVickie Epley MD;  [place];  Service: Cardiovascular;  Laterality: N/A;   CARDIOVERSION N/A 02/25/2017   Procedure: CARDIOVERSION;  Surgeon: RFay Records MD;  [facility];  Service: Cardiovascular;  Laterality: N/A;   CARDIOVERSION N/A 07/30/2021   Procedure: CARDIOVERSION;  Surgeon: BJanina Mayo MD;  [place]  Service: Cardiovascular;  Laterality: N/A;   CARDIOVERSION N/A 04/27/2022   Procedure: CARDIOVERSION;  Surgeon: SJerline Pain MD;  [place]  Service: Cardiovascular;  Laterality: N/A;   CARDIOVERSION N/A 09/15/2022   Procedure: CARDIOVERSION;  Surgeon: CWerner Lean MD;  [place]  Service: Cardiovascular;  Laterality: N/A;   CATARACT EXTRACTION, BILATERAL     CHOLECYSTECTOMY  06/24/2011   COLONOSCOPY     EP IMPLANTABLE DEVICE N/A 09/03/2016   SJM Fortify [address] Dr generator change by Dr ARayann Heman  ICD implanted for secondary prevention of sudden death   IMPLANTABLE CARDIOVERTER DEFIBRILLATOR IMPLANT  08/18/2013   St. Jude Fortify Assura DR ICD implanted by Dr ARayann Hemanfor secondary treatment of VT   IMPLANTABLE CARDIOVERTER DEFIBRILLATOR IMPLANT N/A 08/18/2013   Procedure: IMPLANTABLE CARDIOVERTER DEFIBRILLATOR IMPLANT;  Surgeon: JCoralyn Mark MD;  [facility];  Service: Cardiovascular;  Laterality: N/A;   TEE WITHOUT CARDIOVERSION N/A 07/30/2021   Procedure: TRANSESOPHAGEAL ECHOCARDIOGRAM (TEE);  Surgeon: BHarl Bowie  Royetta Crochet, MD;  Location: [facility];  Service: Cardiovascular;  Laterality: N/A;   Patient Active Problem List   Diagnosis Date Noted   Transaminitis 08/14/2022   Ataxia 08/14/2022   RSV (respiratory syncytial virus pneumonia) 08/13/2022   Gait disturbance 08/13/2022   Secondary hypercoagulable state ([place]) 07/08/2022   Atypical atrial flutter (HCC)    Typical atrial flutter ([place]) 02/23/2017   Rupture of distal biceps tendon, left, initial encounter 12/23/2016   Multiple  lung nodules on CT 03/20/2016   Cough 03/10/2016   Nausea 11/22/2015   PAF (paroxysmal atrial fibrillation) ([place]) 08/20/2015   Hx of colonic polyps 05/14/2014   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/04/2013   VT (ventricular tachycardia) ([place]) 09/04/2013   V tach ([place]) 08/18/2013   Hypertrophic cardiomyopathy ([place])    Heart murmur    Mitral regurgitation    GERD (gastroesophageal reflux disease)     ONSET DATE: 08/15/2022 (date of referral)  REFERRING DIAG: R26 Gait abnormality   THERAPY DIAG:  Unsteadiness on feet  Abnormality of gait and mobility  Rationale for Evaluation and Treatment: Rehabilitation  SUBJECTIVE:                                                                                                                                                                                             SUBJECTIVE STATEMENT: Patient reports no falls since last session. Patient states that he went to the afib clinic yesterday and underwent cardioversion; he is back in rhythm. Patient reports exercises are going well at home; he has a timer to remind him. Patient states that motivation can be the greatest challenge but states he is prepared to keep working.  Pt accompanied by: self  PERTINENT HISTORY:  Per 08/13/2022 Hospital note: "[name] is a 77 y.o. 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 In the ER, he also described symptoms of ataxia, generalized weakness, and nightmares, and so Neurology were consulted.Encephalopathy ruled out.  MRI brain, cervical and lumbar spine without contrast was motion degraded but otherwise within normal limits." Patient symptoms resolved within 30 hours of steroid ending per notes  Per chart Progress note on 08/15/2022: "Patient was "positive for RSV. He was previously prescribed antibiotics and steroids for his respiratory  infection. He saw his PCP on 11/27 and took the steroids and antibiotics for 2 days (stopped 11/29) due to nightmares, anxiety, and abnormal gait.... Patient/wife initially reported last dose of steroid sever days prior to admission to attending MD, but this has been clarified and confirmed  to have been taken day of presentation last."   Atypical atrial flutter Cardioversion scheduled for 09/15/2022  PAIN:  Are you having pain? No  PRECAUTIONS: Fall; mask for RSV (donned throughout session)  WEIGHT BEARING RESTRICTIONS: No  FALLS: Has patient fallen in last 6 months? Yes, Reports x1 with reaction to steroid but denies injuries, reports multiple near falls within the last 6 months, cannot recall specific amounts, uses UE to steady.   LIVING ENVIRONMENT: Lives with: lives with their spouse Lives in: House/apartment Stairs:  2 steps in the garage- currently no railing but plans to add soon; and about 14 steps into attic with railing Has following equipment at home: None  PLOF: Independent  PATIENT GOALS: Patient reports, "I would like to stand up straight." "Improve balance."  OBJECTIVE:   Vitals:   10/02/22 1139  BP: 115/71  Pulse: 80     NMR:  [facility] PT Assessment - 10/02/22 0001       Functional Gait  Assessment   Gait assessed  Yes    Gait Level Surface Walks 20 ft in less than 7 sec but greater than 5.5 sec, uses assistive device, slower speed, mild gait deviations, or deviates 6-10 in outside of the 12 in walkway width.    Change in Gait Speed Able to smoothly change walking speed without loss of balance or gait deviation. Deviate no more than 6 in outside of the 12 in walkway width.    Gait with Horizontal Head Turns Performs head turns smoothly with slight change in gait velocity (eg, minor disruption to smooth gait path), deviates 6-10 in outside 12 in walkway width, or uses an assistive device.    Gait with Vertical Head Turns Performs head turns with no change in gait.  Deviates no more than 6 in outside 12 in walkway width.    Gait and Pivot Turn Pivot turns safely within 3 sec and stops quickly with no loss of balance.    Step Over Obstacle Is able to step over 2 stacked shoe boxes taped together (9 in total height) without changing gait speed. No evidence of imbalance.    Gait with Narrow Base of Support Ambulates 7-9 steps.    Gait with Eyes Closed Walks 20 ft, uses assistive device, slower speed, mild gait deviations, deviates 6-10 in outside 12 in walkway width. Ambulates 20 ft in less than 9 sec but greater than 7 sec.    Ambulating Backwards Walks 20 ft, uses assistive device, slower speed, mild gait deviations, deviates 6-10 in outside 12 in walkway width.    Steps Alternating feet, no rail.    Total Score 25             Review of printout SOT from initial testing to final testing. Patient had improved above age-matched norms.   PATIENT EDUCATION: Education details: Continue HEP, return to PT if notices regression Person educated: Patient Education method: Explanation Education comprehension: verbalized understanding and needs further education  HOME EXERCISE PROGRAM: Access Code: [email or web address] URL: [email or web address] Date: 10/02/2022 Prepared by: Malachi Carl  Exercises - Tandem Stance with Support  - 1 x daily - 7 x weekly - 1 sets - 2 reps - 30 sec hold - Single Leg Stance with Support  - 1 x daily - 7 x weekly - 2 sets - 1-2 reps - 10 sec hold - standing with feet apart on pillow  - 1 x daily - 7 x weekly - 1 sets - 1 reps - Romberg Stance  Eyes Closed on Foam Pad  - 1 x daily - 7 x weekly - 3 sets - 10 reps - Standing Hip Hinge with Dowel  - 1 x daily - 7 x weekly - 3 sets - 10 reps - Tandem Walking  - 1 x daily - 7 x weekly - 3 sets - 6 reps  GOALS: Goals reviewed with patient? Yes  SHORT TERM GOALS: Target date: 09/14/2022  Patient will demonstrate 100% compliance with initial HEP to continue to progress between  physical therapy sessions.   Baseline: Updated on 1/1//2024 Goal status: Ongoing - HEP initiated on 09-10-22; Progressing - 90%  2.  Patient will demonstrate forward gait with head turns up and down without reports of feeling off balance and without gait deviations to improve overall sense of balance (FGA score for this component 3/3) Baseline: Initial: 2/3 on FGA Goal status: Ongoing - pt reports more dizziness provoked with vertical head turns than with horizontal head turns - 09-17-22; MET on 10/02/2022  3.  SOT goal Baseline: To be assessed Goal status: Goal deferred as composite score is only 1 point decreased from Normal    LONG TERM GOALS: Target date: 10/12/2022   Patient will report demonstrate independence with final HEP in order to maintain current gains and continue to progress after physical therapy discharge.   Baseline: To be provided Goal status: MET - Patient reports 90% compliance and feels comfortable with all exercises  2.  Patient will improve FGA to greater than 22/30 to indicate a decreased risk of falls and improved dynamic stability.   Baseline: 20/30; improved to 25/30 on 10/02/2022 Goal status: MET  3.  SOT goal to be assessed Baseline: To be assessed Goal status: Goal deferred due to initial high score of SOT  - 09-10-22 ASSESSMENT:  CLINICAL IMPRESSION: Patient discharged from skilled physical therapy today as demonstrated excellent progress towards goals and demonstrates ability to continue to progress independently. Patient no longer at an increased risk for falls as indicated by FGA score of 25/30. Patient also performed above age reported norms on SOT as assessed last session. Patient reports 90% compliance with HEP and uses timers in his phone to remind him to continue his exercises. Patient was informed of when to return to therapy if noted a regression and provided a final printout of updated HEP. Patient is no longer in need of skilled physical therapy  services at this time as indicated above.   OBJECTIVE IMPAIRMENTS: Abnormal gait, decreased balance, and decreased strength.   ACTIVITY LIMITATIONS: bending  PARTICIPATION LIMITATIONS: yard work  PERSONAL FACTORS: Age and Past/current experiences are also affecting patient's functional outcome.   REHAB POTENTIAL: Excellent  CLINICAL DECISION MAKING: Stable/uncomplicated  EVALUATION COMPLEXITY: Low  PLAN:  PT FREQUENCY: 1x/week  PT DURATION: 6 weeks  PLANNED INTERVENTIONS: Therapeutic exercises, Therapeutic activity, Neuromuscular re-education, Balance training, Gait training, Patient/Family education, Vestibular training, and Canal[place] repositioning  PLAN FOR NEXT SESSION:  NA - Discharge  PHYSICAL THERAPY DISCHARGE SUMMARY  Visits from Start of Care: 5  Current functional level related to goals / functional outcomes: Achieved 3/4 active goals and progressing on final goal reporting 90% compliance with HEP (one goal deferred given patient's high score on initial testing)   Remaining deficits: Mild wide base gait pattern and slows speed with horizontal head turns but safe and [facility]   Education / Equipment: Return to PT if notice regression, Continue HEP   Patient agrees to discharge. Patient goals were nearly all met and  patient demonstrates excellent progress towards final goal and ability to progress independently. Patient is being discharged due to maximized rehab potential.    Malachi Carl, PT, DPT 10/02/2022, 12:08 PM

## 2022-10-22 ENCOUNTER — Encounter (HOSPITAL_COMMUNITY): Payer: Self-pay | Admitting: *Deleted

## 2022-10-27 DIAGNOSIS — M542 Cervicalgia: Secondary | ICD-10-CM | POA: Diagnosis not present

## 2022-10-27 DIAGNOSIS — M9903 Segmental and somatic dysfunction of lumbar region: Secondary | ICD-10-CM | POA: Diagnosis not present

## 2022-10-27 DIAGNOSIS — M9901 Segmental and somatic dysfunction of cervical region: Secondary | ICD-10-CM | POA: Diagnosis not present

## 2022-10-27 DIAGNOSIS — M9902 Segmental and somatic dysfunction of thoracic region: Secondary | ICD-10-CM | POA: Diagnosis not present

## 2022-10-27 DIAGNOSIS — M50222 Other cervical disc displacement at C5-C6 level: Secondary | ICD-10-CM | POA: Diagnosis not present

## 2022-10-27 DIAGNOSIS — M5459 Other low back pain: Secondary | ICD-10-CM | POA: Diagnosis not present

## 2022-10-27 DIAGNOSIS — M5137 Other intervertebral disc degeneration, lumbosacral region: Secondary | ICD-10-CM | POA: Diagnosis not present

## 2022-10-27 DIAGNOSIS — M791 Myalgia, unspecified site: Secondary | ICD-10-CM | POA: Diagnosis not present

## 2022-10-27 DIAGNOSIS — R293 Abnormal posture: Secondary | ICD-10-CM | POA: Diagnosis not present

## 2022-11-12 ENCOUNTER — Encounter: Payer: Self-pay | Admitting: Internal Medicine

## 2022-11-16 ENCOUNTER — Other Ambulatory Visit: Payer: Self-pay | Admitting: Cardiology

## 2022-11-16 DIAGNOSIS — I480 Paroxysmal atrial fibrillation: Secondary | ICD-10-CM

## 2022-11-16 NOTE — Telephone Encounter (Signed)
Prescription refill request for Eliquis received. Indication: a fib Last office visit: 10/01/22 Scr: 1.44 (10/01/22) Age: 77 Weight: 81kg

## 2022-11-24 DIAGNOSIS — M791 Myalgia, unspecified site: Secondary | ICD-10-CM | POA: Diagnosis not present

## 2022-11-24 DIAGNOSIS — M9902 Segmental and somatic dysfunction of thoracic region: Secondary | ICD-10-CM | POA: Diagnosis not present

## 2022-11-24 DIAGNOSIS — M5137 Other intervertebral disc degeneration, lumbosacral region: Secondary | ICD-10-CM | POA: Diagnosis not present

## 2022-11-24 DIAGNOSIS — R293 Abnormal posture: Secondary | ICD-10-CM | POA: Diagnosis not present

## 2022-11-24 DIAGNOSIS — M50222 Other cervical disc displacement at C5-C6 level: Secondary | ICD-10-CM | POA: Diagnosis not present

## 2022-11-24 DIAGNOSIS — M9903 Segmental and somatic dysfunction of lumbar region: Secondary | ICD-10-CM | POA: Diagnosis not present

## 2022-11-24 DIAGNOSIS — M9901 Segmental and somatic dysfunction of cervical region: Secondary | ICD-10-CM | POA: Diagnosis not present

## 2022-11-24 DIAGNOSIS — M542 Cervicalgia: Secondary | ICD-10-CM | POA: Diagnosis not present

## 2022-11-24 DIAGNOSIS — M5459 Other low back pain: Secondary | ICD-10-CM | POA: Diagnosis not present

## 2022-11-26 NOTE — Telephone Encounter (Signed)
Alert received from [employer]:  Alert remote reviewed. Normal device function.   Current ongoing AF  after successful DCCV in January 2024 and on [facility] according to previous reports, AF burden is 35%.  Sent to triage. Next remote 12/04/2022.  Outreach made to Pt.  Will follow.

## 2022-12-02 ENCOUNTER — Encounter: Payer: Medicare Other | Admitting: Cardiology

## 2022-12-04 ENCOUNTER — Encounter: Payer: Self-pay | Admitting: Cardiology

## 2022-12-04 ENCOUNTER — Ambulatory Visit (INDEPENDENT_AMBULATORY_CARE_PROVIDER_SITE_OTHER): Payer: Medicare Other

## 2022-12-04 ENCOUNTER — Other Ambulatory Visit (HOSPITAL_COMMUNITY): Payer: Self-pay | Admitting: *Deleted

## 2022-12-04 ENCOUNTER — Telehealth (HOSPITAL_COMMUNITY): Payer: Self-pay | Admitting: *Deleted

## 2022-12-04 DIAGNOSIS — I4819 Other persistent atrial fibrillation: Secondary | ICD-10-CM

## 2022-12-04 DIAGNOSIS — I422 Other hypertrophic cardiomyopathy: Secondary | ICD-10-CM | POA: Diagnosis not present

## 2022-12-04 LAB — CUP PACEART REMOTE DEVICE CHECK
Battery Remaining Longevity: 36 mo
Battery Remaining Percentage: 37 %
Battery Voltage: 2.92 V
Brady Statistic AP VP Percent: 49 %
Brady Statistic AP VS Percent: 9.2 %
Brady Statistic AS VP Percent: 4.7 %
Brady Statistic AS VS Percent: 36 %
Brady Statistic RA Percent Paced: 34 %
Brady Statistic RV Percent Paced: 50 %
Date Time Interrogation Session: 20240322002626
HighPow Impedance: 47 Ohm
HighPow Impedance: 48 Ohm
Implantable Lead Connection Status: 753985
Implantable Lead Connection Status: 753985
Implantable Lead Implant Date: 20141205
Implantable Lead Implant Date: 20141205
Implantable Lead Location: 753859
Implantable Lead Location: 753860
Implantable Lead Model: 7121
Implantable Pulse Generator Implant Date: 20171221
Lead Channel Impedance Value: 410 Ohm
Lead Channel Impedance Value: 540 Ohm
Lead Channel Pacing Threshold Amplitude: 0.75 V
Lead Channel Pacing Threshold Amplitude: 1 V
Lead Channel Pacing Threshold Pulse Width: 0.5 ms
Lead Channel Pacing Threshold Pulse Width: 0.5 ms
Lead Channel Sensing Intrinsic Amplitude: 12 mV
Lead Channel Sensing Intrinsic Amplitude: 4.7 mV
Lead Channel Setting Pacing Amplitude: 2 V
Lead Channel Setting Pacing Amplitude: 2.5 V
Lead Channel Setting Pacing Pulse Width: 0.5 ms
Lead Channel Setting Sensing Sensitivity: 0.5 mV
Pulse Gen Serial Number: 7387049
Zone Setting Status: 755011

## 2022-12-04 NOTE — Telephone Encounter (Signed)
Pt notified back in AF since 3/20. Per Adline Peals PA will go ahead and get DCCV scheduled since several weeks. Keep scheduled follow up with Dr. Quentin Ore on 4/3 for H&P/Labs for DCCV. No medication changes as pt is rate controlled. Pt in agreement. DCCV scheduled for 4/9 arrival 7am. Pt in agreement with plan.

## 2022-12-04 NOTE — Telephone Encounter (Signed)
Transmission received: Presenting AF/ VS/VP w/ V rate 65-89 beats per min.  Appears to have been in AF since 12/02/22

## 2022-12-04 NOTE — Telephone Encounter (Signed)
Patient called not feeling well he is unsure what rhythm he is in. He has a Chad which he states shows bradycardia. I have asked him to send a transmission to see what his rate/rhythm is. Will call pt back once transmission reviewed.

## 2022-12-10 ENCOUNTER — Telehealth (HOSPITAL_COMMUNITY): Payer: Self-pay | Admitting: *Deleted

## 2022-12-10 NOTE — Telephone Encounter (Signed)
Patient going in and out of afib now. Per ricky fenton will cancel cardioversion. Keep scheduled follow up with Dr. Quentin Ore next week. Pt in agreement.

## 2022-12-10 NOTE — Telephone Encounter (Signed)
Pt called in stating he thinks he maybe going in and out of afib -- instructed pt to send transmission to see rhythm trends. Pt to send transmission now. Will c/b once results known.

## 2022-12-15 NOTE — Progress Notes (Unsigned)
  Electrophysiology Office Follow up Visit Note:    Date:  12/15/2022   ID:  Joseph Morrow, Joseph Morrow August 08, 1946, MRN [id number]  PCP:  Ginger Organ., MD  [facility] Cardiologist:  None  [facility] Electrophysiologist:  Vickie Epley, MD    Interval History:    Joseph Morrow is a 77 y.o. male who presents for a follow up visit.   He has a history of persistent atrial fibrillation.  He has had multiple prior ablations.  On the most recent ablation September 18, 2021 or left pulmonary veins were reisolated.  The posterior wall was isolated.  A perimitral flutter was also targeted.  Typical flutter was also targeted.  The patient is all [facility] October 01, 2022.  At that time he was maintaining normal rhythm.  He takes Eliquis for stroke prophylaxis.  On his most recent device interrogation there is a less than 1% A-fib burden.     Past medical, surgical, social and family history were reviewed.  ROS:   Please see the history of present illness.    All other systems reviewed and are negative.  EKGs/Labs/Other Studies Reviewed:    The following studies were reviewed today:  December 16, 2022 in clinic device interrogation personally reviewed ***    Physical Exam:    VS:  There were no vitals taken for this visit.    Wt Readings from Last 3 Encounters:  10/01/22 181 lb (82.1 kg)  09/30/22 180 lb (81.6 kg)  09/15/22 173 lb (78.5 kg)     GEN: *** Well nourished, well developed in no acute distress CARDIAC: ***RRR, no murmurs, rubs, gallops RESPIRATORY:  Clear to auscultation without rales, wheezing or rhonchi       ASSESSMENT:    No diagnosis found. PLAN:    In order of problems listed above:  #Persistent atrial fibrillation Doing well after his catheter ablation in early 2023.  Less than 1% burden of atrial fibrillation.  Continue Eliquis for stroke prophylaxis. Continue amiodarone 200 mg by mouth once daily. Recheck CMP, TSH and free  T4 today.***  #Ventricular tachycardia #ICD in situ Device functioning appropriately.  Continue remote monitoring.  Follow-up 6 months with APP.  Will need amiodarone monitoring labs at that appointment.       Signed, Lars Mage, MD, [facility], [facility] 12/15/2022 9:10 PM    Electrophysiology [facility]

## 2022-12-16 ENCOUNTER — Ambulatory Visit: Payer: Medicare Other

## 2022-12-16 ENCOUNTER — Ambulatory Visit: Payer: Medicare Other | Attending: Cardiology | Admitting: Cardiology

## 2022-12-16 ENCOUNTER — Encounter: Payer: Self-pay | Admitting: Cardiology

## 2022-12-16 VITALS — BP 124/80 | HR 59 | Ht 70.0 in | Wt 180.0 lb

## 2022-12-16 DIAGNOSIS — I4819 Other persistent atrial fibrillation: Secondary | ICD-10-CM | POA: Diagnosis not present

## 2022-12-16 DIAGNOSIS — Z79899 Other long term (current) drug therapy: Secondary | ICD-10-CM | POA: Diagnosis not present

## 2022-12-16 DIAGNOSIS — I472 Ventricular tachycardia, unspecified: Secondary | ICD-10-CM | POA: Diagnosis not present

## 2022-12-16 DIAGNOSIS — Z95810 Presence of automatic (implantable) cardiac defibrillator: Secondary | ICD-10-CM | POA: Diagnosis not present

## 2022-12-16 NOTE — Progress Notes (Signed)
Electrophysiology Office Follow up Visit Note:    Date:  12/16/2022   ID:  Joseph Morrow, Joseph Morrow [birth year], MRN [id number]  PCP:  Ginger Organ., MD  [facility] Cardiologist:  None  [facility] Electrophysiologist:  Vickie Epley, MD    Interval History:    Joseph Morrow is a 77 y.o. male who presents for a follow up visit.   He has a history of persistent atrial fibrillation.  He has had multiple prior ablations.  On the most recent ablation September 18, 2021 or left pulmonary veins were reisolated.  The posterior wall was isolated.  A perimitral flutter was also targeted.  Typical flutter was also targeted.  The patient is all [facility] October 01, 2022.  At that time he was maintaining normal rhythm.  He takes Eliquis for stroke prophylaxis.  On his most recent device interrogation there is a less than 1% A-fib burden.  Today, he is feeling good overall. He notes that he thought he was in Afib today due to feeling fatigued. His device check was performed with indication of intermittent episodes of Afib previously, but none at the time of his visit today. Usually he will also feel short of breath if he is in atrial fibrillation.  He denies any palpitations, chest pain, or peripheral edema. No lightheadedness, headaches, syncope, orthopnea, or PND.      Past medical, surgical, social and family history were reviewed.  ROS:   Please see the history of present illness.    (+) Fatigue All other systems reviewed and are negative.  EKGs/Labs/Other Studies Reviewed:    The following studies were reviewed today:  December 16, 2022 in clinic device interrogation personally reviewed Battery longevity 3 years Lead parameters stable Changed atrial sensing to auto. No problems observed with FF OS. 38% AF burden     Physical Exam:    VS:  BP 124/80   Pulse (!) 59   Ht 5\' 10"  (1.778 m)   Wt 180 lb (81.6 kg)   SpO2 98%   BMI 25.83 kg/m     Wt  Readings from Last 3 Encounters:  12/16/22 180 lb (81.6 kg)  10/01/22 181 lb (82.1 kg)  09/30/22 180 lb (81.6 kg)     GEN: Well nourished, well developed in no acute distress CARDIAC: RRR, no murmurs, rubs, gallops. ICD pocket well healed. RESPIRATORY:  Clear to auscultation without rales, wheezing or rhonchi       ASSESSMENT:    1. Persistent atrial fibrillation   2. VT (ventricular tachycardia)   3. ICD (implantable cardioverter-defibrillator) in place   4. Encounter for long-term (current) use of high-risk medication    PLAN:    In order of problems listed above:  #Persistent atrial fibrillation Doing well after his catheter ablation in early 2023. Still having some AF despite treatment with amiodarone. Continue Eliquis for stroke prophylaxis. Continue amiodarone 200 mg by mouth once daily. Recheck CMP, TSH and free T4 today.  #Ventricular tachycardia #ICD in situ Device functioning appropriately.  Continue remote monitoring.  Follow-up 6 months with APP.  Will need amiodarone monitoring labs at that appointment.   I,Mathew Stumpf,acting as a Education administrator for Vickie Epley, MD.,have documented all relevant documentation on the behalf of Vickie Epley, MD,as directed by  Vickie Epley, MD while in the presence of Vickie Epley, MD.  I, Vickie Epley, MD, have reviewed all documentation for this visit. The documentation on 12/16/22 for the exam, diagnosis,  procedures, and orders are all accurate and complete.  Signed, Lars Mage, MD, [facility], [facility] 12/16/2022 6:00 PM    Electrophysiology [facility]

## 2022-12-16 NOTE — Patient Instructions (Signed)
Medication Instructions:  Your physician recommends that you continue on your current medications as directed. Please refer to the Current Medication list given to you today. *If you need a refill on your cardiac medications before your next appointment, please call your pharmacy*   Lab Work: CMET, TSH, and T4 TODAY If you have labs (blood work) drawn today and your tests are completely normal, you will receive your results only by: [place] (if you have MyChart) OR A paper copy in the mail If you have any lab test that is abnormal or we need to change your treatment, we will call you to review the results.   Follow-Up: At [facility], you and your health needs are our priority.  As part of our continuing mission to provide you with exceptional heart care, we have created designated Provider Care Teams.  These Care Teams include your primary Cardiologist (physician) and Advanced Practice Providers (APPs -  Physician Assistants and Nurse Practitioners) who all work together to provide you with the care you need, when you need it.  We recommend signing up for the patient portal called "MyChart".  Sign up information is provided on this After Visit Summary.  MyChart is used to connect with patients for Virtual Visits (Telemedicine).  Patients are able to view lab/test results, encounter notes, upcoming appointments, etc.  Non-urgent messages can be sent to your provider as well.   To learn more about what you can do with MyChart, go to [email or web address].    Your next appointment:   6 month(s)  Provider:   You will see one of the following Advanced Practice Providers on your designated Care Team:   Tommye Standard, [place] "[facility]" [place], [place]

## 2022-12-17 LAB — T4, FREE: Free T4: 1.32 ng/dL (ref 0.82–1.77)

## 2022-12-17 LAB — COMPREHENSIVE METABOLIC PANEL
ALT: 20 IU/L (ref 0–44)
AST: 18 IU/L (ref 0–40)
Albumin/Globulin Ratio: 1.5 (ref 1.2–2.2)
Albumin: 4 g/dL (ref 3.8–4.8)
Alkaline Phosphatase: 113 IU/L (ref 44–121)
BUN/Creatinine Ratio: 17 (ref 10–24)
BUN: 23 mg/dL (ref 8–27)
Bilirubin Total: 0.4 mg/dL (ref 0.0–1.2)
CO2: 25 mmol/L (ref 20–29)
Calcium: 9.7 mg/dL (ref 8.6–10.2)
Chloride: 99 mmol/L (ref 96–106)
Creatinine, Ser: 1.39 mg/dL — ABNORMAL HIGH (ref 0.76–1.27)
Globulin, Total: 2.6 g/dL (ref 1.5–4.5)
Glucose: 94 mg/dL (ref 70–99)
Potassium: 4.5 mmol/L (ref 3.5–5.2)
Sodium: 139 mmol/L (ref 134–144)
Total Protein: 6.6 g/dL (ref 6.0–8.5)
eGFR: 52 mL/min/{1.73_m2} — ABNORMAL LOW (ref >59)

## 2022-12-17 LAB — TSH: TSH: 3.02 u[IU]/mL (ref 0.450–4.500)

## 2022-12-21 DIAGNOSIS — M9901 Segmental and somatic dysfunction of cervical region: Secondary | ICD-10-CM | POA: Diagnosis not present

## 2022-12-21 DIAGNOSIS — R293 Abnormal posture: Secondary | ICD-10-CM | POA: Diagnosis not present

## 2022-12-21 DIAGNOSIS — M791 Myalgia, unspecified site: Secondary | ICD-10-CM | POA: Diagnosis not present

## 2022-12-21 DIAGNOSIS — M5459 Other low back pain: Secondary | ICD-10-CM | POA: Diagnosis not present

## 2022-12-21 DIAGNOSIS — M542 Cervicalgia: Secondary | ICD-10-CM | POA: Diagnosis not present

## 2022-12-21 DIAGNOSIS — M9903 Segmental and somatic dysfunction of lumbar region: Secondary | ICD-10-CM | POA: Diagnosis not present

## 2022-12-21 DIAGNOSIS — M50222 Other cervical disc displacement at C5-C6 level: Secondary | ICD-10-CM | POA: Diagnosis not present

## 2022-12-21 DIAGNOSIS — M9902 Segmental and somatic dysfunction of thoracic region: Secondary | ICD-10-CM | POA: Diagnosis not present

## 2022-12-21 DIAGNOSIS — M5137 Other intervertebral disc degeneration, lumbosacral region: Secondary | ICD-10-CM | POA: Diagnosis not present

## 2022-12-22 ENCOUNTER — Encounter (HOSPITAL_COMMUNITY): Payer: Self-pay

## 2022-12-22 ENCOUNTER — Ambulatory Visit (HOSPITAL_COMMUNITY): Admit: 2022-12-22 | Payer: Medicare Other | Admitting: Cardiology

## 2022-12-22 SURGERY — CARDIOVERSION
Anesthesia: General

## 2022-12-23 LAB — CUP PACEART INCLINIC DEVICE CHECK
Battery Remaining Longevity: 37 mo
Brady Statistic RA Percent Paced: 32 %
Brady Statistic RV Percent Paced: 48 %
Date Time Interrogation Session: 20240403140730
HighPow Impedance: 54.2654
Implantable Lead Connection Status: 753985
Implantable Lead Connection Status: 753985
Implantable Lead Implant Date: 20141205
Implantable Lead Implant Date: 20141205
Implantable Lead Location: 753859
Implantable Lead Location: 753860
Implantable Lead Model: 7121
Implantable Pulse Generator Implant Date: 20171221
Lead Channel Impedance Value: 450 Ohm
Lead Channel Impedance Value: 562.5 Ohm
Lead Channel Pacing Threshold Amplitude: 0.75 V
Lead Channel Pacing Threshold Amplitude: 0.75 V
Lead Channel Pacing Threshold Amplitude: 0.75 V
Lead Channel Pacing Threshold Amplitude: 0.75 V
Lead Channel Pacing Threshold Pulse Width: 0.5 ms
Lead Channel Pacing Threshold Pulse Width: 0.5 ms
Lead Channel Pacing Threshold Pulse Width: 0.5 ms
Lead Channel Pacing Threshold Pulse Width: 0.5 ms
Lead Channel Sensing Intrinsic Amplitude: 12 mV
Lead Channel Sensing Intrinsic Amplitude: 5 mV
Lead Channel Setting Pacing Amplitude: 2 V
Lead Channel Setting Pacing Amplitude: 2.5 V
Lead Channel Setting Pacing Pulse Width: 0.5 ms
Lead Channel Setting Sensing Sensitivity: 0.5 mV
Pulse Gen Serial Number: 7387049
Zone Setting Status: 755011

## 2023-01-05 NOTE — Progress Notes (Signed)
Remote ICD transmission.   

## 2023-01-25 DIAGNOSIS — M9901 Segmental and somatic dysfunction of cervical region: Secondary | ICD-10-CM | POA: Diagnosis not present

## 2023-01-25 DIAGNOSIS — M5459 Other low back pain: Secondary | ICD-10-CM | POA: Diagnosis not present

## 2023-01-25 DIAGNOSIS — M542 Cervicalgia: Secondary | ICD-10-CM | POA: Diagnosis not present

## 2023-01-25 DIAGNOSIS — M9902 Segmental and somatic dysfunction of thoracic region: Secondary | ICD-10-CM | POA: Diagnosis not present

## 2023-01-25 DIAGNOSIS — M791 Myalgia, unspecified site: Secondary | ICD-10-CM | POA: Diagnosis not present

## 2023-01-25 DIAGNOSIS — M50222 Other cervical disc displacement at C5-C6 level: Secondary | ICD-10-CM | POA: Diagnosis not present

## 2023-01-25 DIAGNOSIS — R293 Abnormal posture: Secondary | ICD-10-CM | POA: Diagnosis not present

## 2023-01-25 DIAGNOSIS — M5137 Other intervertebral disc degeneration, lumbosacral region: Secondary | ICD-10-CM | POA: Diagnosis not present

## 2023-01-25 DIAGNOSIS — M9903 Segmental and somatic dysfunction of lumbar region: Secondary | ICD-10-CM | POA: Diagnosis not present

## 2023-02-22 DIAGNOSIS — M5459 Other low back pain: Secondary | ICD-10-CM | POA: Diagnosis not present

## 2023-02-22 DIAGNOSIS — M542 Cervicalgia: Secondary | ICD-10-CM | POA: Diagnosis not present

## 2023-02-22 DIAGNOSIS — M9901 Segmental and somatic dysfunction of cervical region: Secondary | ICD-10-CM | POA: Diagnosis not present

## 2023-02-22 DIAGNOSIS — M791 Myalgia, unspecified site: Secondary | ICD-10-CM | POA: Diagnosis not present

## 2023-02-22 DIAGNOSIS — M5137 Other intervertebral disc degeneration, lumbosacral region: Secondary | ICD-10-CM | POA: Diagnosis not present

## 2023-02-22 DIAGNOSIS — M9903 Segmental and somatic dysfunction of lumbar region: Secondary | ICD-10-CM | POA: Diagnosis not present

## 2023-02-22 DIAGNOSIS — R293 Abnormal posture: Secondary | ICD-10-CM | POA: Diagnosis not present

## 2023-02-22 DIAGNOSIS — M9902 Segmental and somatic dysfunction of thoracic region: Secondary | ICD-10-CM | POA: Diagnosis not present

## 2023-02-22 DIAGNOSIS — M50222 Other cervical disc displacement at C5-C6 level: Secondary | ICD-10-CM | POA: Diagnosis not present

## 2023-03-05 ENCOUNTER — Ambulatory Visit: Payer: Medicare Other

## 2023-03-05 DIAGNOSIS — I472 Ventricular tachycardia, unspecified: Secondary | ICD-10-CM | POA: Diagnosis not present

## 2023-03-05 LAB — CUP PACEART REMOTE DEVICE CHECK
Battery Remaining Longevity: 32 mo
Battery Remaining Percentage: 34 %
Battery Voltage: 2.92 V
Brady Statistic AP VP Percent: 27 %
Brady Statistic AP VS Percent: 17 %
Brady Statistic AS VP Percent: 2.5 %
Brady Statistic AS VS Percent: 53 %
Brady Statistic RA Percent Paced: 42 %
Brady Statistic RV Percent Paced: 29 %
Date Time Interrogation Session: 20240621025954
HighPow Impedance: 47 Ohm
HighPow Impedance: 47 Ohm
Implantable Lead Connection Status: 753985
Implantable Lead Connection Status: 753985
Implantable Lead Implant Date: 20141205
Implantable Lead Implant Date: 20141205
Implantable Lead Location: 753859
Implantable Lead Location: 753860
Implantable Lead Model: 7121
Implantable Pulse Generator Implant Date: 20171221
Lead Channel Impedance Value: 400 Ohm
Lead Channel Impedance Value: 510 Ohm
Lead Channel Pacing Threshold Amplitude: 0.75 V
Lead Channel Pacing Threshold Amplitude: 0.75 V
Lead Channel Pacing Threshold Pulse Width: 0.5 ms
Lead Channel Pacing Threshold Pulse Width: 0.5 ms
Lead Channel Sensing Intrinsic Amplitude: 0.2 mV
Lead Channel Sensing Intrinsic Amplitude: 12 mV
Lead Channel Setting Pacing Amplitude: 2 V
Lead Channel Setting Pacing Amplitude: 2.5 V
Lead Channel Setting Pacing Pulse Width: 0.5 ms
Lead Channel Setting Sensing Sensitivity: 0.5 mV
Pulse Gen Serial Number: 7387049
Zone Setting Status: 755011

## 2023-03-14 ENCOUNTER — Encounter: Payer: Self-pay | Admitting: Cardiology

## 2023-03-22 DIAGNOSIS — Z961 Presence of intraocular lens: Secondary | ICD-10-CM | POA: Diagnosis not present

## 2023-03-22 DIAGNOSIS — H5005 Alternating esotropia: Secondary | ICD-10-CM | POA: Diagnosis not present

## 2023-03-22 DIAGNOSIS — H5201 Hypermetropia, right eye: Secondary | ICD-10-CM | POA: Diagnosis not present

## 2023-03-22 DIAGNOSIS — H52223 Regular astigmatism, bilateral: Secondary | ICD-10-CM | POA: Diagnosis not present

## 2023-03-22 DIAGNOSIS — H532 Diplopia: Secondary | ICD-10-CM | POA: Diagnosis not present

## 2023-03-22 DIAGNOSIS — H524 Presbyopia: Secondary | ICD-10-CM | POA: Diagnosis not present

## 2023-03-22 DIAGNOSIS — H26491 Other secondary cataract, right eye: Secondary | ICD-10-CM | POA: Diagnosis not present

## 2023-03-23 DIAGNOSIS — M9903 Segmental and somatic dysfunction of lumbar region: Secondary | ICD-10-CM | POA: Diagnosis not present

## 2023-03-23 DIAGNOSIS — M542 Cervicalgia: Secondary | ICD-10-CM | POA: Diagnosis not present

## 2023-03-23 DIAGNOSIS — M50222 Other cervical disc displacement at C5-C6 level: Secondary | ICD-10-CM | POA: Diagnosis not present

## 2023-03-23 DIAGNOSIS — M5137 Other intervertebral disc degeneration, lumbosacral region: Secondary | ICD-10-CM | POA: Diagnosis not present

## 2023-03-23 DIAGNOSIS — M791 Myalgia, unspecified site: Secondary | ICD-10-CM | POA: Diagnosis not present

## 2023-03-23 DIAGNOSIS — R293 Abnormal posture: Secondary | ICD-10-CM | POA: Diagnosis not present

## 2023-03-23 DIAGNOSIS — M9902 Segmental and somatic dysfunction of thoracic region: Secondary | ICD-10-CM | POA: Diagnosis not present

## 2023-03-23 DIAGNOSIS — M9901 Segmental and somatic dysfunction of cervical region: Secondary | ICD-10-CM | POA: Diagnosis not present

## 2023-03-23 DIAGNOSIS — M5459 Other low back pain: Secondary | ICD-10-CM | POA: Diagnosis not present

## 2023-03-23 NOTE — Progress Notes (Signed)
Remote ICD transmission.   

## 2023-03-29 ENCOUNTER — Encounter: Payer: Self-pay | Admitting: Gastroenterology

## 2023-04-08 ENCOUNTER — Encounter (HOSPITAL_COMMUNITY): Payer: Self-pay | Admitting: Physician Assistant

## 2023-04-08 ENCOUNTER — Ambulatory Visit (HOSPITAL_COMMUNITY)
Admission: RE | Admit: 2023-04-08 | Discharge: 2023-04-08 | Disposition: A | Discharge status: Home / Self Care | Payer: Medicare Other | Source: Ambulatory Visit | Attending: Physician Assistant | Admitting: Physician Assistant

## 2023-04-08 VITALS — BP 126/74 | HR 60 | Ht 70.0 in | Wt 176.8 lb

## 2023-04-08 DIAGNOSIS — Z95810 Presence of automatic (implantable) cardiac defibrillator: Secondary | ICD-10-CM | POA: Insufficient documentation

## 2023-04-08 DIAGNOSIS — Z5181 Encounter for therapeutic drug level monitoring: Secondary | ICD-10-CM

## 2023-04-08 DIAGNOSIS — I4892 Unspecified atrial flutter: Secondary | ICD-10-CM | POA: Diagnosis not present

## 2023-04-08 DIAGNOSIS — Z7901 Long term (current) use of anticoagulants: Secondary | ICD-10-CM | POA: Insufficient documentation

## 2023-04-08 DIAGNOSIS — Z79899 Other long term (current) drug therapy: Secondary | ICD-10-CM | POA: Diagnosis not present

## 2023-04-08 DIAGNOSIS — I4819 Other persistent atrial fibrillation: Secondary | ICD-10-CM | POA: Insufficient documentation

## 2023-04-08 DIAGNOSIS — D6869 Other thrombophilia: Secondary | ICD-10-CM | POA: Diagnosis not present

## 2023-04-08 LAB — COMPREHENSIVE METABOLIC PANEL
ALT: 38 U/L (ref 0–44)
AST: 22 U/L (ref 15–41)
Albumin: 3.3 g/dL — ABNORMAL LOW (ref 3.5–5.0)
Alkaline Phosphatase: 72 U/L (ref 38–126)
Anion gap: 8 (ref 5–15)
BUN: 20 mg/dL (ref 8–23)
CO2: 27 mmol/L (ref 22–32)
Calcium: 9.3 mg/dL (ref 8.9–10.3)
Chloride: 104 mmol/L (ref 98–111)
Creatinine, Ser: 1.44 mg/dL — ABNORMAL HIGH (ref 0.61–1.24)
GFR, Estimated: 50 mL/min — ABNORMAL LOW (ref >60)
Glucose, Bld: 125 mg/dL — ABNORMAL HIGH (ref 70–99)
Potassium: 4.9 mmol/L (ref 3.5–5.1)
Sodium: 139 mmol/L (ref 135–145)
Total Bilirubin: 0.7 mg/dL (ref 0.3–1.2)
Total Protein: 6.1 g/dL — ABNORMAL LOW (ref 6.5–8.1)

## 2023-04-08 LAB — TSH: TSH: 2.655 u[IU]/mL (ref 0.350–4.500)

## 2023-04-08 NOTE — Progress Notes (Signed)
Primary Care Physician: Cleatis Polka., MD Referring Physician:Dr. Lalla Brothers  EP: Dr. Aleda Grana Joseph Morrow is a 77 y.o. male with a h/o hypertrophic cardiomyopathy, s/p ICD for VT, atrial fibrillation, maintaining SR with a very small afib burden on Sotalol. Hospitalized for sotalol administration 02/2017. S/p afib ablation 07/04/20 and 09/18/21. Eventually loaded on amiodarone.   On follow up today, patient reports that he has done well since his last visit. His remote device transmission 6/21 showed no afib since April. No bleeding issues on anticoagulation.   Today, he denies symptoms of palpitations, chest pain, orthopnea, PND, dizziness, presyncope, syncope, or neurologic sequela.The patient is tolerating medications without difficulties and is otherwise without complaint today.   Past Medical History:  Diagnosis Date   Acute cholecystitis 06/21/2011   Allergy    SEASONAL   Cataract    Complication of anesthesia    HALLUCINATIONS   ED (erectile dysfunction)    GERD (gastroesophageal reflux disease)    Hearing loss    Helicobacter pylori gastritis 06/2014   Hemorrhoids    Hiatal hernia    Hx of adenomatous colonic polyps 06/2014   Hypertrophic cardiomyopathy (HCC)    Mitral regurgitation    Moderate   Osteopenia    Paroxysmal atrial fibrillation (HCC) 04/2015   documented on ICD remote interrogation, treated with sotalol   Septal defect    LV Septal hyptertrophy 18mm, normal EF, minimal LVOT gradiant.   Tubular adenoma of colon    VT (ventricular tachycardia) (HCC) 08/18/2013   s/p St. Jude ICD   ROS- All systems are reviewed and negative except as per the HPI above  Physical Exam: Vitals:   04/08/23 0930  BP: 126/74  Pulse: 60  Weight: 80.2 kg  Height: 5\' 10"  (1.778 m)     Wt Readings from Last 3 Encounters:  04/08/23 80.2 kg  12/16/22 81.6 kg  10/01/22 82.1 kg    GEN: Well nourished, well developed in no acute distress NECK: No JVD;  No carotid bruits CARDIAC: Regular rate and rhythm with occasional ectopy, no murmurs, rubs, gallops RESPIRATORY:  Clear to auscultation without rales, wheezing or rhonchi  ABDOMEN: Soft, non-tender, non-distended EXTREMITIES:  No edema; No deformity    ECG today demonstrates  AV dual pacing with PVCs Vent. rate 60 BPM PR interval 344 ms QRS duration 96 ms QT/QTcB 446/446 ms   Echo 08/23/19  1. Hypertrophic cardiomyopathy, sigmoid subtype. Basal septum measures 18 mm in maximal dimension. Flow acceleration noted at the LVOT, no LVOT obstruction noted. Chordal systolic anterior motion of the mitral valve without involvment of the anterior leaflet.   2. Left ventricular ejection fraction, by visual estimation, is 60 to  65%. The left ventricle has normal function. There is severely increased  left ventricular hypertrophy.   3. Elevated left atrial and left ventricular end-diastolic pressures.   4. Left ventricular diastolic parameters are consistent with Grade III  diastolic dysfunction (restrictive).   5. Global right ventricle has normal systolic function.The right  ventricular size is normal. No increase in right ventricular wall  thickness.   6. Left atrial size was moderately dilated.   7. Right atrial size was moderately dilated.   8. The mitral valve is normal in structure. Mild mitral valve  regurgitation. No evidence of mitral stenosis.   9. The tricuspid valve is normal in structure. Tricuspid valve  regurgitation is mild.  10. The aortic valve is tricuspid. Aortic valve regurgitation is not  visualized.  No evidence of aortic valve sclerosis or stenosis.  11. The pulmonic valve was normal in structure. Pulmonic valve  regurgitation is mild.  12. Moderately elevated pulmonary artery systolic pressure.  13. The inferior vena cava is dilated in size with <50% respiratory  variability, suggesting right atrial pressure of 15 mmHg.  14. The left ventricle has no regional wall  motion abnormalities.    CHA2DS2-VASc Score = 3  The patient's score is based upon: CHF History: 0 HTN History: 0 Diabetes History: 0 Stroke History: 0 Vascular Disease History: 1 (aoritc atherosclerosis) Age Score: 2 Gender Score: 0       ASSESSMENT AND PLAN: Persistent Atrial Fibrillation/atrial flutter The patient's CHA2DS2-VASc score is 3, indicating a 3.2% annual risk of stroke.   S/p ablation 06/2020 with redo ablation 09/2021   Patient appears to be maintaining SR Continue amiodarone 200 mg daily Check cmet/TSH Continue Eliquis 5 mg BID Continue Toprol 50 mg daily Continue diltiazem 30 mg PRN q 4 hours for heart racing.  Secondary Hypercoagulable State (ICD10:  D68.69) The patient is at significant risk for stroke/thromboembolism based upon his CHA2DS2-VASc Score of 3.  Continue Apixaban (Eliquis).   HCM St Jude -Merlin ICD, followed by Dr Lalla Brothers and the device clinic.  VT S/p ICD   Follow up with Katrina Stack as scheduled. AF clinic in one year.    [name] PA-C Afib [facility] [address] [place], [place] [address] [phone]

## 2023-04-26 DIAGNOSIS — M5137 Other intervertebral disc degeneration, lumbosacral region: Secondary | ICD-10-CM | POA: Diagnosis not present

## 2023-04-26 DIAGNOSIS — M9902 Segmental and somatic dysfunction of thoracic region: Secondary | ICD-10-CM | POA: Diagnosis not present

## 2023-04-26 DIAGNOSIS — M5459 Other low back pain: Secondary | ICD-10-CM | POA: Diagnosis not present

## 2023-04-26 DIAGNOSIS — M9901 Segmental and somatic dysfunction of cervical region: Secondary | ICD-10-CM | POA: Diagnosis not present

## 2023-04-26 DIAGNOSIS — M542 Cervicalgia: Secondary | ICD-10-CM | POA: Diagnosis not present

## 2023-04-26 DIAGNOSIS — M791 Myalgia, unspecified site: Secondary | ICD-10-CM | POA: Diagnosis not present

## 2023-04-26 DIAGNOSIS — R293 Abnormal posture: Secondary | ICD-10-CM | POA: Diagnosis not present

## 2023-04-26 DIAGNOSIS — M50222 Other cervical disc displacement at C5-C6 level: Secondary | ICD-10-CM | POA: Diagnosis not present

## 2023-04-26 DIAGNOSIS — M9903 Segmental and somatic dysfunction of lumbar region: Secondary | ICD-10-CM | POA: Diagnosis not present

## 2023-04-28 DIAGNOSIS — I480 Paroxysmal atrial fibrillation: Secondary | ICD-10-CM | POA: Diagnosis not present

## 2023-04-28 DIAGNOSIS — K625 Hemorrhage of anus and rectum: Secondary | ICD-10-CM | POA: Diagnosis not present

## 2023-05-20 DIAGNOSIS — I480 Paroxysmal atrial fibrillation: Secondary | ICD-10-CM | POA: Diagnosis not present

## 2023-05-20 DIAGNOSIS — J069 Acute upper respiratory infection, unspecified: Secondary | ICD-10-CM | POA: Diagnosis not present

## 2023-05-20 DIAGNOSIS — D6869 Other thrombophilia: Secondary | ICD-10-CM | POA: Diagnosis not present

## 2023-05-20 DIAGNOSIS — R0600 Dyspnea, unspecified: Secondary | ICD-10-CM | POA: Diagnosis not present

## 2023-05-20 DIAGNOSIS — Z95810 Presence of automatic (implantable) cardiac defibrillator: Secondary | ICD-10-CM | POA: Diagnosis not present

## 2023-05-20 DIAGNOSIS — Z7901 Long term (current) use of anticoagulants: Secondary | ICD-10-CM | POA: Diagnosis not present

## 2023-05-20 DIAGNOSIS — M545 Low back pain, unspecified: Secondary | ICD-10-CM | POA: Diagnosis not present

## 2023-05-24 DIAGNOSIS — M542 Cervicalgia: Secondary | ICD-10-CM | POA: Diagnosis not present

## 2023-05-24 DIAGNOSIS — M9901 Segmental and somatic dysfunction of cervical region: Secondary | ICD-10-CM | POA: Diagnosis not present

## 2023-05-24 DIAGNOSIS — M5459 Other low back pain: Secondary | ICD-10-CM | POA: Diagnosis not present

## 2023-05-24 DIAGNOSIS — M9903 Segmental and somatic dysfunction of lumbar region: Secondary | ICD-10-CM | POA: Diagnosis not present

## 2023-05-24 DIAGNOSIS — M9902 Segmental and somatic dysfunction of thoracic region: Secondary | ICD-10-CM | POA: Diagnosis not present

## 2023-05-24 DIAGNOSIS — M50222 Other cervical disc displacement at C5-C6 level: Secondary | ICD-10-CM | POA: Diagnosis not present

## 2023-05-24 DIAGNOSIS — M791 Myalgia, unspecified site: Secondary | ICD-10-CM | POA: Diagnosis not present

## 2023-05-24 DIAGNOSIS — R293 Abnormal posture: Secondary | ICD-10-CM | POA: Diagnosis not present

## 2023-05-24 DIAGNOSIS — M5137 Other intervertebral disc degeneration, lumbosacral region: Secondary | ICD-10-CM | POA: Diagnosis not present

## 2023-05-31 ENCOUNTER — Other Ambulatory Visit: Payer: Self-pay

## 2023-05-31 DIAGNOSIS — I480 Paroxysmal atrial fibrillation: Secondary | ICD-10-CM

## 2023-05-31 MED ORDER — APIXABAN 5 MG PO TABS
5.0000 mg | ORAL_TABLET | Freq: Two times a day (BID) | ORAL | 1 refills | Status: DC
Start: 2023-05-31 — End: 2023-11-25

## 2023-05-31 NOTE — Telephone Encounter (Signed)
Prescription refill request for Eliquis received. Indication:afib Last office visit:7/24 Scr:1.44  7/24 Age: 77 Weight:80.2  kg  Prescription refilled

## 2023-06-04 ENCOUNTER — Ambulatory Visit (INDEPENDENT_AMBULATORY_CARE_PROVIDER_SITE_OTHER): Payer: Medicare Other

## 2023-06-04 DIAGNOSIS — I422 Other hypertrophic cardiomyopathy: Secondary | ICD-10-CM

## 2023-06-04 LAB — CUP PACEART REMOTE DEVICE CHECK
Battery Remaining Longevity: 30 mo
Battery Remaining Percentage: 32 %
Battery Voltage: 2.92 V
Brady Statistic AP VP Percent: 34 %
Brady Statistic AP VS Percent: 16 %
Brady Statistic AS VP Percent: 4.4 %
Brady Statistic AS VS Percent: 44 %
Brady Statistic RA Percent Paced: 47 %
Brady Statistic RV Percent Paced: 39 %
Date Time Interrogation Session: 20240920020016
HighPow Impedance: 46 Ohm
HighPow Impedance: 46 Ohm
Implantable Lead Connection Status: 753985
Implantable Lead Connection Status: 753985
Implantable Lead Implant Date: 20141205
Implantable Lead Implant Date: 20141205
Implantable Lead Location: 753859
Implantable Lead Location: 753860
Implantable Lead Model: 7121
Implantable Pulse Generator Implant Date: 20171221
Lead Channel Impedance Value: 400 Ohm
Lead Channel Impedance Value: 510 Ohm
Lead Channel Pacing Threshold Amplitude: 0.75 V
Lead Channel Pacing Threshold Amplitude: 0.75 V
Lead Channel Pacing Threshold Pulse Width: 0.5 ms
Lead Channel Pacing Threshold Pulse Width: 0.5 ms
Lead Channel Sensing Intrinsic Amplitude: 12 mV
Lead Channel Sensing Intrinsic Amplitude: 3 mV
Lead Channel Setting Pacing Amplitude: 2 V
Lead Channel Setting Pacing Amplitude: 2.5 V
Lead Channel Setting Pacing Pulse Width: 0.5 ms
Lead Channel Setting Sensing Sensitivity: 0.5 mV
Pulse Gen Serial Number: 7387049
Zone Setting Status: 755011

## 2023-06-06 ENCOUNTER — Encounter: Payer: Self-pay | Admitting: Cardiology

## 2023-06-11 ENCOUNTER — Telehealth: Payer: Self-pay

## 2023-06-11 ENCOUNTER — Ambulatory Visit (INDEPENDENT_AMBULATORY_CARE_PROVIDER_SITE_OTHER): Payer: Medicare Other | Admitting: Gastroenterology

## 2023-06-11 ENCOUNTER — Encounter: Payer: Self-pay | Admitting: Gastroenterology

## 2023-06-11 VITALS — BP 114/66 | HR 64 | Ht 70.0 in | Wt 173.0 lb

## 2023-06-11 DIAGNOSIS — Z7901 Long term (current) use of anticoagulants: Secondary | ICD-10-CM | POA: Insufficient documentation

## 2023-06-11 DIAGNOSIS — I480 Paroxysmal atrial fibrillation: Secondary | ICD-10-CM

## 2023-06-11 DIAGNOSIS — Z86010 Personal history of colonic polyps: Secondary | ICD-10-CM

## 2023-06-11 DIAGNOSIS — Z5181 Encounter for therapeutic drug level monitoring: Secondary | ICD-10-CM | POA: Insufficient documentation

## 2023-06-11 MED ORDER — NA SULFATE-K SULFATE-MG SULF 17.5-3.13-1.6 GM/177ML PO SOLN: 1.0000 | Freq: Once | ORAL | 0 refills | Status: AC

## 2023-06-11 NOTE — Telephone Encounter (Signed)
     Primary Cardiologist: Dr. Lalla Brothers  Chart reviewed as part of pre-operative protocol coverage. Given past medical history and time since last visit, based on ACC/AHA guidelines, Joseph Morrow would be at acceptable risk for the planned procedure without further cardiovascular testing.   Patient with diagnosis of afib on Eliquis for anticoagulation.     Procedure:  Colonoscopy  Date of procedure: 08/19/2023     CHA2DS2-VASc Score = 3   This indicates a 3.2% annual risk of stroke. The patient's score is based upon: CHF History: 0 HTN History: 0 Diabetes History: 0 Stroke History: 0 Vascular Disease History: 1 (aoritc atherosclerosis) Age Score: 2 Gender Score: 0       CrCl 47 mL/min  Platelet count 183 K (09/15/2022)       Per office protocol, patient can hold Eliquis for 2 days prior to procedure.   I will route this recommendation to the requesting party via Epic fax function and remove from pre-op pool.  Please call with questions.  Thomasene Ripple. Kam Kushnir NP-C     06/11/2023, 2:43 PM [facility] [address] Office [phone] Fax [phone]

## 2023-06-11 NOTE — Telephone Encounter (Signed)
Patient with diagnosis of afib on Eliquis for anticoagulation.    Procedure:  Colonoscopy  Date of procedure: 08/19/2023   CHA2DS2-VASc Score = 3   This indicates a 3.2% annual risk of stroke. The patient's score is based upon: CHF History: 0 HTN History: 0 Diabetes History: 0 Stroke History: 0 Vascular Disease History: 1 (aoritc atherosclerosis) Age Score: 2 Gender Score: 0     CrCl 47 mL/min  Platelet count 183 K (09/15/2022)    Per office protocol, patient can hold Eliquis for 2 days prior to procedure.     **This guidance is not considered finalized until pre-operative APP has relayed final recommendations.**

## 2023-06-11 NOTE — Progress Notes (Signed)
06/11/2023 Joseph Morrow [id number] [birth year]   HISTORY OF PRESENT ILLNESS: This is a 77 year old male who is a patient of Dr. Lauro Franklin.  He has past medical history of hypertrophic cardiomyopathy, paroxysmal atrial fibrillation on Eliquis, history of VT with a pacemaker/defibrillator.  Follows with Dr. Lalla Brothers for cardiology.  He is here today to discuss and schedule his surveillance colonoscopy.  Colonoscopy as below with a 3-year recall recommended.  Colonoscopy 09/2019:  - One 1 mm polyp in the cecum, removed with a cold snare. Resected and retrieved. - One 9 mm polyp in the ascending colon, removed with a cold snare. Resected and retrieved. - Four 3 to 5 mm polyps in the descending colon, removed with a cold snare. Resected and retrieved. - Internal hemorrhoids.  1. Surgical [P], colon, descending, polyp (4) - TUBULAR ADENOMA (1 OF 4 FRAGMENTS) - BENIGN COLONIC MUCOSA (3 OF 4 FRAGMENTS) - NO HIGH GRADE DYSPLASIA OR MALIGNANCY IDENTIFIED 2. Surgical [P], colon, cecal, ascending, polyp (2) - MULTIPLE FRAGMENTS OF TUBULAR ADENOMA(S) - NO HIGH GRADE DYSPLASIA OR MALIGNANCY IDENTIFIED  He tells me that overall he is doing well.  Does report some leakage of liquid stool when walking, etc.  He also then started noticing some leakage of blood at times.  His PCP told him it could be hemorrhoids causing the bleeding so he did use some suppositories for a little while and says that that helped with the bleeding, does not think he has seen any blood since using those.  Says that he usually has 1 or 2 soft, sticky type bowel movements daily.  Past Medical History:  Diagnosis Date   Acute cholecystitis 06/21/2011   Allergy    SEASONAL   Cataract    Complication of anesthesia    HALLUCINATIONS   ED (erectile dysfunction)    GERD (gastroesophageal reflux disease)    Hearing loss    Helicobacter pylori gastritis 06/2014   Hemorrhoids    Hiatal hernia    Hx of adenomatous  colonic polyps 06/2014   Hypertrophic cardiomyopathy (HCC)    Mitral regurgitation    Moderate   Osteopenia    Paroxysmal atrial fibrillation (HCC) 04/2015   documented on ICD remote interrogation, treated with sotalol   Septal defect    LV Septal hyptertrophy 18mm, normal EF, minimal LVOT gradiant.   Tubular adenoma of colon    VT (ventricular tachycardia) (HCC) 08/18/2013   s/p St. Jude ICD   Past Surgical History:  Procedure Laterality Date   APPENDECTOMY  1950   ATRIAL FIBRILLATION ABLATION N/A 07/04/2020   Procedure: ATRIAL FIBRILLATION ABLATION;  Surgeon: [name], MD;  Location: [facility] INVASIVE CV LAB;  Service: Cardiovascular;  Laterality: N/A;   ATRIAL FIBRILLATION ABLATION N/A 09/18/2021   Procedure: ATRIAL FIBRILLATION ABLATION;  Surgeon: Lanier Prude, MD;  Location: [facility] INVASIVE CV LAB;  Service: Cardiovascular;  Laterality: N/A;   CARDIOVERSION N/A 02/25/2017   Procedure: CARDIOVERSION;  Surgeon: Pricilla Riffle, MD;  Location: [facility] ENDOSCOPY;  Service: Cardiovascular;  Laterality: N/A;   CARDIOVERSION N/A 07/30/2021   Procedure: CARDIOVERSION;  Surgeon: Maisie Fus, MD;  Location: [facility] ENDOSCOPY;  Service: Cardiovascular;  Laterality: N/A;   CARDIOVERSION N/A 04/27/2022   Procedure: CARDIOVERSION;  Surgeon: Jake Bathe, MD;  Location: [facility] ENDOSCOPY;  Service: Cardiovascular;  Laterality: N/A;   CARDIOVERSION N/A 09/15/2022   Procedure: CARDIOVERSION;  Surgeon: Christell Constant, MD;  Location: [facility] ENDOSCOPY;  Service: Cardiovascular;  Laterality: N/A;   CATARACT EXTRACTION,  BILATERAL     CHOLECYSTECTOMY  06/24/2011   COLONOSCOPY     EP IMPLANTABLE DEVICE N/A 09/03/2016   SJM Fortify Assura Dr generator change by Dr Johney Frame.  ICD implanted for secondary prevention of sudden death   IMPLANTABLE CARDIOVERTER DEFIBRILLATOR IMPLANT  08/18/2013   St. Jude Fortify Assura DR ICD implanted by Dr Johney Frame for secondary treatment of VT   IMPLANTABLE CARDIOVERTER  DEFIBRILLATOR IMPLANT N/A 08/18/2013   Procedure: IMPLANTABLE CARDIOVERTER DEFIBRILLATOR IMPLANT;  Surgeon: Gardiner Rhyme, MD;  Location: [facility] CATH LAB;  Service: Cardiovascular;  Laterality: N/A;   TEE WITHOUT CARDIOVERSION N/A 07/30/2021   Procedure: TRANSESOPHAGEAL ECHOCARDIOGRAM (TEE);  Surgeon: Maisie Fus, MD;  [facility];  Service: Cardiovascular;  Laterality: N/A;    reports that he quit smoking about 41 years ago. His smoking use included cigarettes. He started smoking about 61 years ago. He has a 40 pack-year smoking history. He has never used smokeless tobacco. He reports that he does not currently use alcohol. He reports that he does not use drugs. family history includes Arrhythmia in his brother and mother; Colon polyps in his father; Diabetes in his brother and father; Heart Problems in his brother and brother; Heart attack in his brother; Heart failure in his mother; Hypertrophic cardiomyopathy in his brother and mother; Pneumonia in his father; Sleep apnea in his brother; Sudden death in his brother. Allergies  Allergen Reactions   Prednisone Anxiety    Extreme anxiety, hallucinations   Dextromethorphan Other (See Comments)    nervous   Meclizine     Caused increased heart rate and constipation   Versed [Midazolam] Other (See Comments)    "hallucinations, headache"   Zofran [Ondansetron]     Caused increased heart rate and constipation   Antihistamines, Diphenhydramine-Type Anxiety      Outpatient Encounter Medications as of 06/11/2023  Medication Sig   acetaminophen (TYLENOL) 500 MG tablet Take 500-1,000 mg by mouth every 6 (six) hours as needed (pain.).   amiodarone (PACERONE) 200 MG tablet Take 1 tablet (200 mg total) by mouth daily.   apixaban (ELIQUIS) 5 MG TABS tablet Take 1 tablet (5 mg total) by mouth 2 (two) times daily.   calcium carbonate (OSCAL) 1500 (600 Ca) MG TABS tablet Take 600 mg by mouth in the morning and at bedtime.   cholecalciferol  (VITAMIN D3) 25 MCG (1000 UNIT) tablet Take 1,000 Units by mouth in the morning and at bedtime.   Coenzyme Q10 (CO Q 10) 100 MG CAPS Take 100 mg by mouth in the morning.   diltiazem (CARDIZEM) 30 MG tablet Cardizem 30mg  -- take 1 tablet every 4 hours AS NEEDED for heart rate >100   furosemide (LASIX) 20 MG tablet Take 1 tablet by mouth daily until 11/16 then only as needed for swelling/weight gain   Glucosamine-Chondroitin (COSAMIN DS PO) Take 1 tablet by mouth in the morning.   metoprolol succinate (TOPROL-XL) 50 MG 24 hr tablet Take 1 tablet (50 mg total) by mouth daily. Take with or immediately following a meal.   potassium citrate (UROCIT-K) 10 MEQ (1080 MG) SR tablet Take 1 tablet (10 mEq total) by mouth daily as needed (with lasix (swelling/weight gain)).   Probiotic Product (ALIGN PO) Take 1 capsule by mouth in the morning.   Zinc 50 MG CAPS Take 50 mg by mouth 2 (two) times daily.    No facility-administered encounter medications on file as of 06/11/2023.    REVIEW OF SYSTEMS  : All other systems  reviewed and negative except where noted in the History of Present Illness.   PHYSICAL EXAM: BP 114/66   Pulse 64   Ht 5\' 10"  (1.778 m)   Wt 173 lb (78.5 kg)   BMI 24.82 kg/m  General: Well developed white male in no acute distress Head: Normocephalic and atraumatic Eyes:  Sclerae anicteric, conjunctiva pink. Ears: Normal auditory acuity Lungs: Clear throughout to auscultation; no W/R/R. Heart: Regular rate and rhythm; no M/R/G. Rectal:  Will be done at the time of colonoscopy. Musculoskeletal: Symmetrical with no gross deformities  Skin: No lesions on visible extremities Extremities: No edema  Neurological: Alert oriented x 4, grossly non-focal Psychological:  Alert and cooperative. Normal mood and affect  ASSESSMENT AND PLAN: *Personal history of colon polyps: Six polyps removed in January 2021, one was 9 mm in size.  At least 2 of these tubular adenomas on pathology.  Repeat  recommended in 3 years.  Will schedule Dr. Rhea Belton. *Chronic anticoagulation with Eliquis for PAF:  Will hold Eliquis for 2 days prior to endoscopic procedures - will instruct when and how to resume after procedure. Benefits and risks of procedure explained including risks of bleeding, perforation, infection, missed lesions, reactions to medications and possible need for hospitalization and surgery for complications. Additional rare but real risk of stroke or other vascular clotting events off of Eliquis also explained and need to seek urgent help if any signs of these problems occur. Will communicate by phone or EMR with patient's prescribing provider, Dr. Lalla Brothers, to confirm that holding Eliquis is reasonable in this case.   *Stool leakage: Reports leakage of some liquid stool when walking, etc.  Likely due to either incomplete emptying of stool and/or pelvic floor weakness/decreased sphincter tone.  Discussed trying Benefiber powder 2 teaspoons mixed in 8 ounces of liquid daily to try to help with bulking of the stool and more complete elimination and also discussed pelvic floor physical therapy.  Will think about and consider those.  CC:  Cleatis Polka., MD

## 2023-06-11 NOTE — Progress Notes (Signed)
Addendum: Reviewed and agree with assessment and management plan. Kadijah Shamoon M, MD  

## 2023-06-11 NOTE — Telephone Encounter (Signed)
 [facility] Pre-operative Risk Assessment     Request for surgical clearance:     Endoscopy Procedure  What type of surgery is being performed?     Colonoscopy  When is this surgery scheduled?     08/19/23  What type of clearance is required ?   Pharmacy  Are there any medications that need to be held prior to surgery and how long? Eliquis 2 days   Practice name and name of physician performing surgery?       Gastroenterology  What is your office phone and fax number?      Phone- [phone]  Fax- [phone]  Anesthesia type (None, local, MAC, general) ?       MAC

## 2023-06-11 NOTE — Patient Instructions (Signed)
Start Benefiber or Citrucel 2 teaspoons in 8 ounces of liquid daily and may increase to twice daily if tolerated.    You have been scheduled for a colonoscopy. Please follow written instructions given to you at your visit today.   Please pick up your prep supplies at the pharmacy within the next 1-3 days.  If you use inhalers (even only as needed), please bring them with you on the day of your procedure.  DO NOT TAKE 7 DAYS PRIOR TO TEST- Trulicity (dulaglutide) Ozempic, Wegovy (semaglutide) Mounjaro (tirzepatide) Bydureon Bcise (exanatide extended release)  DO NOT TAKE 1 DAY PRIOR TO YOUR TEST Rybelsus (semaglutide) Adlyxin (lixisenatide) Victoza (liraglutide) Byetta (exanatide) ___________________________________________________________________________   _______________________________________________________  If your blood pressure at your visit was 140/90 or greater, please contact your primary care physician to follow up on this.  _______________________________________________________  If you are age 66 or older, your body mass index should be between 23-30. Your Body mass index is 24.82 kg/m. If this is out of the aforementioned range listed, please consider follow up with your Primary Care Provider.  If you are age 19 or younger, your body mass index should be between 19-25. Your Body mass index is 24.82 kg/m. If this is out of the aformentioned range listed, please consider follow up with your Primary Care Provider.   ________________________________________________________  The [facility] providers would like to encourage you to use [facility] to communicate with providers for non-urgent requests or questions.  Due to long hold times on the telephone, sending your provider a message by [facility] may be a faster and more efficient way to get a response.  Please allow 48 business hours for a response.  Please remember that this is for non-urgent requests.   _______________________________________________________   It was a pleasure to see you today!  Thank you for trusting me with your gastrointestinal care!    Doug Sou, PA-C

## 2023-06-14 NOTE — Progress Notes (Signed)
Remote ICD transmission.   

## 2023-06-16 NOTE — Progress Notes (Signed)
Electrophysiology Office Note:   ID:  Joseph Morrow, Joseph Morrow 1946/05/12, MRN [id number]  Primary Cardiologist: None Electrophysiologist: Lanier Prude, MD  {Click to update primary MD,subspecialty MD or APP then REFRESH:1}    History of Present Illness:   Joseph Morrow is a 77 y.o. male with h/o HOCM s/p ICD for VT, and AF on Sotalol seen today for routine electrophysiology followup.   Since last being seen in our clinic the patient reports doing ***.  he denies chest pain, palpitations, dyspnea, PND, orthopnea, nausea, vomiting, dizziness, syncope, edema, weight gain, or early satiety.   Review of systems complete and found to be negative unless listed in HPI.   EP Information / Studies Reviewed:    EKG is ordered today. Personal review as below.       ICD Interrogation-  reviewed in detail today,  See PACEART report.  AF history Ablation 06/2020 and 09/2021  Device History: Abbott Dual Chamber ICD implanted 2014, gen change 2017 (advisory) for VT in HOCM  Physical Exam:   VS:  There were no vitals taken for this visit.   Wt Readings from Last 3 Encounters:  06/11/23 173 lb (78.5 kg)  04/08/23 176 lb 12.8 oz (80.2 kg)  12/16/22 180 lb (81.6 kg)     GEN: Well nourished, well developed in no acute distress NECK: No JVD; No carotid bruits CARDIAC: {EPRHYTHM:28826}, no murmurs, rubs, gallops RESPIRATORY:  Clear to auscultation without rales, wheezing or rhonchi  ABDOMEN: Soft, non-tender, non-distended EXTREMITIES:  No edema; No deformity   ASSESSMENT AND PLAN:    Hypertrophic cardiomyopathy s/p Abbott dual chamber ICD  euvolemic today Stable on an appropriate medical regimen Normal ICD function See Pace Art report No changes today  Ventricular Tachycardia  Atrial fibrillation, paroxysmal S/p ablation 06/2020 and 09/18/2021 EKG today shows *** Burden *** Continue amiodarone 200 mg daily Surveillance labs today    Disposition:   Follow  up with {EPPROVIDERS:28135} {EPFOLLOW UP:28173}   Signed, Graciella Freer, PA-C

## 2023-06-17 ENCOUNTER — Ambulatory Visit: Payer: Medicare Other | Attending: Student | Admitting: Student

## 2023-06-17 ENCOUNTER — Encounter: Payer: Self-pay | Admitting: Student

## 2023-06-17 VITALS — BP 118/76 | HR 52 | Ht 70.0 in | Wt 176.0 lb

## 2023-06-17 DIAGNOSIS — I4819 Other persistent atrial fibrillation: Secondary | ICD-10-CM | POA: Diagnosis not present

## 2023-06-17 DIAGNOSIS — I422 Other hypertrophic cardiomyopathy: Secondary | ICD-10-CM | POA: Insufficient documentation

## 2023-06-17 DIAGNOSIS — I472 Ventricular tachycardia, unspecified: Secondary | ICD-10-CM | POA: Insufficient documentation

## 2023-06-17 LAB — CUP PACEART INCLINIC DEVICE CHECK
Battery Remaining Longevity: 31 mo
Brady Statistic RA Percent Paced: 48 %
Brady Statistic RV Percent Paced: 40 %
Date Time Interrogation Session: 20241003101423
HighPow Impedance: 48.375
Implantable Lead Connection Status: 753985
Implantable Lead Connection Status: 753985
Implantable Lead Implant Date: 20141205
Implantable Lead Implant Date: 20141205
Implantable Lead Location: 753859
Implantable Lead Location: 753860
Implantable Lead Model: 7121
Implantable Pulse Generator Implant Date: 20171221
Lead Channel Impedance Value: 425 Ohm
Lead Channel Impedance Value: 512.5 Ohm
Lead Channel Pacing Threshold Amplitude: 0.75 V
Lead Channel Pacing Threshold Amplitude: 0.75 V
Lead Channel Pacing Threshold Amplitude: 1 V
Lead Channel Pacing Threshold Amplitude: 1 V
Lead Channel Pacing Threshold Pulse Width: 0.5 ms
Lead Channel Pacing Threshold Pulse Width: 0.5 ms
Lead Channel Pacing Threshold Pulse Width: 0.5 ms
Lead Channel Pacing Threshold Pulse Width: 0.5 ms
Lead Channel Sensing Intrinsic Amplitude: 12 mV
Lead Channel Sensing Intrinsic Amplitude: 3.6 mV
Lead Channel Setting Pacing Amplitude: 2 V
Lead Channel Setting Pacing Amplitude: 2.5 V
Lead Channel Setting Pacing Pulse Width: 0.5 ms
Lead Channel Setting Sensing Sensitivity: 0.5 mV
Pulse Gen Serial Number: 7387049
Zone Setting Status: 755011

## 2023-06-17 NOTE — Patient Instructions (Signed)
Medication Instructions:  Your physician recommends that you continue on your current medications as directed. Please refer to the Current Medication list given to you today.  *If you need a refill on your cardiac medications before your next appointment, please call your pharmacy*  Lab Work: CMET, CBC, TSH, FreeT4-TODAY If you have labs (blood work) drawn today and your tests are completely normal, you will receive your results only by: MyChart Message (if you have MyChart) OR A paper copy in the mail If you have any lab test that is abnormal or we need to change your treatment, we will call you to review the results.  Follow-Up: At [facility], you and your health needs are our priority.  As part of our continuing mission to provide you with exceptional heart care, we have created designated Provider Care Teams.  These Care Teams include your primary Cardiologist (physician) and Advanced Practice Providers (APPs -  Physician Assistants and Nurse Practitioners) who all work together to provide you with the care you need, when you need it.  Your next appointment:   6 month(s)  Provider:   Steffanie Dunn, MD

## 2023-06-18 LAB — COMPREHENSIVE METABOLIC PANEL
ALT: 29 [IU]/L (ref 0–44)
AST: 26 [IU]/L (ref 0–40)
Albumin: 3.8 g/dL (ref 3.8–4.8)
Alkaline Phosphatase: 96 [IU]/L (ref 44–121)
BUN/Creatinine Ratio: 14 (ref 10–24)
BUN: 20 mg/dL (ref 8–27)
Bilirubin Total: 0.5 mg/dL (ref 0.0–1.2)
CO2: 28 mmol/L (ref 20–29)
Calcium: 9.5 mg/dL (ref 8.6–10.2)
Chloride: 102 mmol/L (ref 96–106)
Creatinine, Ser: 1.4 mg/dL — ABNORMAL HIGH (ref 0.76–1.27)
Globulin, Total: 2.4 g/dL (ref 1.5–4.5)
Glucose: 108 mg/dL — ABNORMAL HIGH (ref 70–99)
Potassium: 4.9 mmol/L (ref 3.5–5.2)
Sodium: 139 mmol/L (ref 134–144)
Total Protein: 6.2 g/dL (ref 6.0–8.5)
eGFR: 51 mL/min/{1.73_m2} — ABNORMAL LOW (ref >59)

## 2023-06-18 LAB — T4, FREE: Free T4: 1.53 ng/dL (ref 0.82–1.77)

## 2023-06-18 LAB — CBC
Hematocrit: 44.9 % (ref 37.5–51.0)
Hemoglobin: 14.4 g/dL (ref 13.0–17.7)
MCH: 29 pg (ref 26.6–33.0)
MCHC: 32.1 g/dL (ref 31.5–35.7)
MCV: 91 fL (ref 79–97)
Platelets: 196 10*3/uL (ref 150–450)
RBC: 4.96 x10E6/uL (ref 4.14–5.80)
RDW: 13.7 % (ref 11.6–15.4)
WBC: 6.9 10*3/uL (ref 3.4–10.8)

## 2023-06-18 LAB — TSH: TSH: 2.37 u[IU]/mL (ref 0.450–4.500)

## 2023-06-19 DIAGNOSIS — Z23 Encounter for immunization: Secondary | ICD-10-CM | POA: Diagnosis not present

## 2023-06-20 ENCOUNTER — Encounter: Payer: Self-pay | Admitting: Cardiology

## 2023-06-21 ENCOUNTER — Other Ambulatory Visit (HOSPITAL_COMMUNITY): Payer: Self-pay | Admitting: Physician Assistant

## 2023-06-28 DIAGNOSIS — M9901 Segmental and somatic dysfunction of cervical region: Secondary | ICD-10-CM | POA: Diagnosis not present

## 2023-06-28 DIAGNOSIS — M791 Myalgia, unspecified site: Secondary | ICD-10-CM | POA: Diagnosis not present

## 2023-06-28 DIAGNOSIS — M9903 Segmental and somatic dysfunction of lumbar region: Secondary | ICD-10-CM | POA: Diagnosis not present

## 2023-06-28 DIAGNOSIS — R293 Abnormal posture: Secondary | ICD-10-CM | POA: Diagnosis not present

## 2023-06-28 DIAGNOSIS — M5137 Other intervertebral disc degeneration, lumbosacral region with discogenic back pain only: Secondary | ICD-10-CM | POA: Diagnosis not present

## 2023-06-28 DIAGNOSIS — M50222 Other cervical disc displacement at C5-C6 level: Secondary | ICD-10-CM | POA: Diagnosis not present

## 2023-06-28 DIAGNOSIS — M5459 Other low back pain: Secondary | ICD-10-CM | POA: Diagnosis not present

## 2023-06-28 DIAGNOSIS — M542 Cervicalgia: Secondary | ICD-10-CM | POA: Diagnosis not present

## 2023-06-28 DIAGNOSIS — M9902 Segmental and somatic dysfunction of thoracic region: Secondary | ICD-10-CM | POA: Diagnosis not present

## 2023-06-30 ENCOUNTER — Telehealth (HOSPITAL_COMMUNITY): Payer: Self-pay | Admitting: *Deleted

## 2023-06-30 NOTE — Telephone Encounter (Signed)
Patient states he feels he may be back in afib - he will send a transmission to confirm his rhythm.

## 2023-06-30 NOTE — Telephone Encounter (Signed)
Patient is currently in SR. Presenting rhythm does show occasional PVC's which I explained to pt this can throw off other types of monitors and watches. Patient is currently a symptomatic. Pt appreciates f/u call.

## 2023-07-13 DIAGNOSIS — M5137 Other intervertebral disc degeneration, lumbosacral region with discogenic back pain only: Secondary | ICD-10-CM | POA: Diagnosis not present

## 2023-07-13 DIAGNOSIS — M50222 Other cervical disc displacement at C5-C6 level: Secondary | ICD-10-CM | POA: Diagnosis not present

## 2023-07-13 DIAGNOSIS — M9901 Segmental and somatic dysfunction of cervical region: Secondary | ICD-10-CM | POA: Diagnosis not present

## 2023-07-13 DIAGNOSIS — R293 Abnormal posture: Secondary | ICD-10-CM | POA: Diagnosis not present

## 2023-07-13 DIAGNOSIS — M542 Cervicalgia: Secondary | ICD-10-CM | POA: Diagnosis not present

## 2023-07-13 DIAGNOSIS — D225 Melanocytic nevi of trunk: Secondary | ICD-10-CM | POA: Diagnosis not present

## 2023-07-13 DIAGNOSIS — M9903 Segmental and somatic dysfunction of lumbar region: Secondary | ICD-10-CM | POA: Diagnosis not present

## 2023-07-13 DIAGNOSIS — M5459 Other low back pain: Secondary | ICD-10-CM | POA: Diagnosis not present

## 2023-07-13 DIAGNOSIS — L812 Freckles: Secondary | ICD-10-CM | POA: Diagnosis not present

## 2023-07-13 DIAGNOSIS — M9902 Segmental and somatic dysfunction of thoracic region: Secondary | ICD-10-CM | POA: Diagnosis not present

## 2023-07-13 DIAGNOSIS — L570 Actinic keratosis: Secondary | ICD-10-CM | POA: Diagnosis not present

## 2023-07-13 DIAGNOSIS — M791 Myalgia, unspecified site: Secondary | ICD-10-CM | POA: Diagnosis not present

## 2023-07-13 DIAGNOSIS — L821 Other seborrheic keratosis: Secondary | ICD-10-CM | POA: Diagnosis not present

## 2023-07-13 DIAGNOSIS — D1801 Hemangioma of skin and subcutaneous tissue: Secondary | ICD-10-CM | POA: Diagnosis not present

## 2023-07-24 DIAGNOSIS — Z23 Encounter for immunization: Secondary | ICD-10-CM | POA: Diagnosis not present

## 2023-07-26 DIAGNOSIS — M9903 Segmental and somatic dysfunction of lumbar region: Secondary | ICD-10-CM | POA: Diagnosis not present

## 2023-07-26 DIAGNOSIS — M542 Cervicalgia: Secondary | ICD-10-CM | POA: Diagnosis not present

## 2023-07-26 DIAGNOSIS — R293 Abnormal posture: Secondary | ICD-10-CM | POA: Diagnosis not present

## 2023-07-26 DIAGNOSIS — M9902 Segmental and somatic dysfunction of thoracic region: Secondary | ICD-10-CM | POA: Diagnosis not present

## 2023-07-26 DIAGNOSIS — M9901 Segmental and somatic dysfunction of cervical region: Secondary | ICD-10-CM | POA: Diagnosis not present

## 2023-07-26 DIAGNOSIS — M5459 Other low back pain: Secondary | ICD-10-CM | POA: Diagnosis not present

## 2023-07-26 DIAGNOSIS — M5137 Other intervertebral disc degeneration, lumbosacral region with discogenic back pain only: Secondary | ICD-10-CM | POA: Diagnosis not present

## 2023-07-26 DIAGNOSIS — M50222 Other cervical disc displacement at C5-C6 level: Secondary | ICD-10-CM | POA: Diagnosis not present

## 2023-07-26 DIAGNOSIS — M791 Myalgia, unspecified site: Secondary | ICD-10-CM | POA: Diagnosis not present

## 2023-08-17 NOTE — Telephone Encounter (Signed)
Patient informed to hold Eliquis 2 days prior to his procedure.

## 2023-08-17 NOTE — Telephone Encounter (Signed)
Inbound call from patient states no one has contacted him in regards to his Eliquis hold. Patient has procedure on 12/9 at 4:00 PM.

## 2023-08-19 ENCOUNTER — Encounter: Payer: Medicare Other | Admitting: Internal Medicine

## 2023-08-22 ENCOUNTER — Encounter: Payer: Self-pay | Admitting: Certified Registered Nurse Anesthetist

## 2023-08-23 ENCOUNTER — Encounter: Payer: Self-pay | Admitting: Internal Medicine

## 2023-08-23 ENCOUNTER — Ambulatory Visit: Payer: Medicare Other | Admitting: Internal Medicine

## 2023-08-23 VITALS — BP 115/74 | HR 111 | Temp 97.4°F | Resp 22 | Ht 70.0 in | Wt 173.0 lb

## 2023-08-23 DIAGNOSIS — Z860100 Personal history of colon polyps, unspecified: Secondary | ICD-10-CM

## 2023-08-23 DIAGNOSIS — Z860101 Personal history of adenomatous and serrated colon polyps: Secondary | ICD-10-CM

## 2023-08-23 DIAGNOSIS — D123 Benign neoplasm of transverse colon: Secondary | ICD-10-CM

## 2023-08-23 DIAGNOSIS — Z1211 Encounter for screening for malignant neoplasm of colon: Secondary | ICD-10-CM | POA: Diagnosis not present

## 2023-08-23 DIAGNOSIS — K648 Other hemorrhoids: Secondary | ICD-10-CM | POA: Diagnosis not present

## 2023-08-23 DIAGNOSIS — I480 Paroxysmal atrial fibrillation: Secondary | ICD-10-CM | POA: Diagnosis not present

## 2023-08-23 DIAGNOSIS — K644 Residual hemorrhoidal skin tags: Secondary | ICD-10-CM

## 2023-08-23 MED ORDER — SODIUM CHLORIDE 0.9 % IV SOLN: 500.0000 mL | Freq: Once | INTRAVENOUS | Status: DC

## 2023-08-23 NOTE — Progress Notes (Unsigned)
GASTROENTEROLOGY PROCEDURE H&P NOTE   Primary Care Physician: Cleatis Polka., MD    Reason for Procedure:  History of adenomatous colon polyps  Plan:    Colonoscopy  Patient is appropriate for endoscopic procedure(s) in the ambulatory ([facility]) setting.  The nature of the procedure, as well as the risks, benefits, and alternatives were carefully and thoroughly reviewed with the patient. Ample time for discussion and questions allowed. The patient understood, was satisfied, and agreed to proceed.     HPI: Joseph Morrow is a 77 y.o. male who presents for surveillance colonoscopy.  Medical history as below.  Tolerated the prep.  No recent chest pain or shortness of breath.  No abdominal pain today.  Eliquis on hold x 2 days  Past Medical History:  Diagnosis Date   Acute cholecystitis 06/21/2011   Allergy    SEASONAL   Cataract    Complication of anesthesia    HALLUCINATIONS   ED (erectile dysfunction)    GERD (gastroesophageal reflux disease)    Hearing loss    Helicobacter pylori gastritis 06/2014   Hemorrhoids    Hiatal hernia    Hx of adenomatous colonic polyps 06/2014   Hypertrophic cardiomyopathy (HCC)    Mitral regurgitation    Moderate   Osteopenia    Paroxysmal atrial fibrillation (HCC) 04/2015   documented on ICD remote interrogation, treated with sotalol   Septal defect    LV Septal hyptertrophy 18mm, normal EF, minimal LVOT gradiant.   Tubular adenoma of colon    VT (ventricular tachycardia) (HCC) 08/18/2013   s/p St. Jude ICD    Past Surgical History:  Procedure Laterality Date   APPENDECTOMY  1950   ATRIAL FIBRILLATION ABLATION N/A 07/04/2020   Procedure: ATRIAL FIBRILLATION ABLATION;  Surgeon: [name], MD;  Location: [facility] INVASIVE CV LAB;  Service: Cardiovascular;  Laterality: N/A;   ATRIAL FIBRILLATION ABLATION N/A 09/18/2021   Procedure: ATRIAL FIBRILLATION ABLATION;  Surgeon: Lanier Prude, MD;  Location: [facility] INVASIVE CV  LAB;  Service: Cardiovascular;  Laterality: N/A;   CARDIOVERSION N/A 02/25/2017   Procedure: CARDIOVERSION;  Surgeon: Pricilla Riffle, MD;  Location[facility] ENDOSCOPY;  Service: Cardiovascular;  Laterality: N/A;   CARDIOVERSION N/A 07/30/2021   Procedure: CARDIOVERSION;  Surgeon: Maisie Fus, MD;  Location: [facility] ENDOSCOPY;  Service: Cardiovascular;  Laterality: N/A;   CARDIOVERSION N/A 04/27/2022   Procedure: CARDIOVERSION;  Surgeon: Jake Bathe, MD;  Location: [facility] ENDOSCOPY;  Service: Cardiovascular;  Laterality: N/A;   CARDIOVERSION N/A 09/15/2022   Procedure: CARDIOVERSION;  Surgeon: Christell Constant, MD;  Location: [facility] ENDOSCOPY;  Service: Cardiovascular;  Laterality: N/A;   CATARACT EXTRACTION, BILATERAL     CHOLECYSTECTOMY  06/24/2011   COLONOSCOPY     EP IMPLANTABLE DEVICE N/A 09/03/2016   SJM Fortify [address] Dr generator change by Dr Johney Frame.  ICD implanted for secondary prevention of sudden death   IMPLANTABLE CARDIOVERTER DEFIBRILLATOR IMPLANT  08/18/2013   St. Jude Fortify Assura DR ICD implanted by Dr Johney Frame for secondary treatment of VT   IMPLANTABLE CARDIOVERTER DEFIBRILLATOR IMPLANT N/A 08/18/2013   Procedure: IMPLANTABLE CARDIOVERTER DEFIBRILLATOR IMPLANT;  Surgeon: Gardiner Rhyme, MD;  Location: [facility] CATH LAB;  Service: Cardiovascular;  Laterality: N/A;   TEE WITHOUT CARDIOVERSION N/A 07/30/2021   Procedure: TRANSESOPHAGEAL ECHOCARDIOGRAM (TEE);  Surgeon: Maisie Fus, MD;  Location: [facility] ENDOSCOPY;  Service: Cardiovascular;  Laterality: N/A;    Prior to Admission medications   Medication Sig Start Date End Date Taking? Authorizing Provider  amiodarone (PACERONE) 200 MG tablet TAKE 1 TABLET BY MOUTH EVERY DAY 06/21/23  Yes Fenton, Clint R, PA  calcium carbonate (OSCAL) 1500 (600 Ca) MG TABS tablet Take 600 mg by mouth in the morning and at bedtime.   Yes [provider]  cholecalciferol (VITAMIN D3) 25 MCG (1000 UNIT) tablet Take 1,000 Units by mouth in the morning and  at bedtime.   Yes [provider]  Coenzyme Q10 (CO Q 10) 100 MG CAPS Take 100 mg by mouth in the morning.   Yes [provider]  furosemide (LASIX) 20 MG tablet Take 1 tablet by mouth daily until 11/16 then only as needed for swelling/weight gain 09/10/21  Yes Lanier Prude, MD  Glucosamine-Chondroitin (COSAMIN DS PO) Take 1 tablet by mouth in the morning.   Yes [provider]  metoprolol succinate (TOPROL-XL) 50 MG 24 hr tablet Take 1 tablet (50 mg total) by mouth daily. Take with or immediately following a meal. 10/01/22  Yes Fenton, Clint R, PA  potassium citrate (UROCIT-K) 10 MEQ (1080 MG) SR tablet Take 1 tablet (10 mEq total) by mouth daily as needed (with lasix (swelling/weight gain)). 09/10/21  Yes Lanier Prude, MD  Probiotic Product (ALIGN PO) Take 1 capsule by mouth in the morning.   Yes [provider]  Zinc 50 MG CAPS Take 50 mg by mouth 2 (two) times daily.    Yes [provider]  acetaminophen (TYLENOL) 500 MG tablet Take 500-1,000 mg by mouth every 6 (six) hours as needed (pain.). Patient not taking: Reported on 08/23/2023    [provider]  apixaban (ELIQUIS) 5 MG TABS tablet Take 1 tablet (5 mg total) by mouth 2 (two) times daily. 05/31/23   Lanier Prude, MD  diltiazem (CARDIZEM) 30 MG tablet Cardizem 30mg  -- take 1 tablet every 4 hours AS NEEDED for heart rate >100 Patient not taking: Reported on 08/23/2023 07/24/21   Newman Nip, NP    Current Outpatient Medications  Medication Sig Dispense Refill   amiodarone (PACERONE) 200 MG tablet TAKE 1 TABLET BY MOUTH EVERY DAY 90 tablet 1   calcium carbonate (OSCAL) 1500 (600 Ca) MG TABS tablet Take 600 mg by mouth in the morning and at bedtime.     cholecalciferol (VITAMIN D3) 25 MCG (1000 UNIT) tablet Take 1,000 Units by mouth in the morning and at bedtime.     Coenzyme Q10 (CO Q 10) 100 MG CAPS Take 100 mg by mouth in the morning.     furosemide (LASIX) 20 MG  tablet Take 1 tablet by mouth daily until 11/16 then only as needed for swelling/weight gain 30 tablet 11   Glucosamine-Chondroitin (COSAMIN DS PO) Take 1 tablet by mouth in the morning.     metoprolol succinate (TOPROL-XL) 50 MG 24 hr tablet Take 1 tablet (50 mg total) by mouth daily. Take with or immediately following a meal. 90 tablet 3   potassium citrate (UROCIT-K) 10 MEQ (1080 MG) SR tablet Take 1 tablet (10 mEq total) by mouth daily as needed (with lasix (swelling/weight gain)). 30 tablet 11   Probiotic Product (ALIGN PO) Take 1 capsule by mouth in the morning.     Zinc 50 MG CAPS Take 50 mg by mouth 2 (two) times daily.      acetaminophen (TYLENOL) 500 MG tablet Take 500-1,000 mg by mouth every 6 (six) hours as needed (pain.). (Patient not taking: Reported on 08/23/2023)     apixaban (ELIQUIS) 5 MG TABS  tablet Take 1 tablet (5 mg total) by mouth 2 (two) times daily. 180 tablet 1   diltiazem (CARDIZEM) 30 MG tablet Cardizem 30mg  -- take 1 tablet every 4 hours AS NEEDED for heart rate >100 (Patient not taking: Reported on 08/23/2023) 30 tablet 3   Current Facility-Administered Medications  Medication Dose Route Frequency Provider Last Rate Last Admin   0.9 %  sodium chloride infusion  500 mL Intravenous Once Aaliyana Fredericks, Carie Caddy, MD        Allergies as of 08/23/2023 - Review Complete 08/23/2023  Allergen Reaction Noted   Prednisone Anxiety 08/14/2022   Dextromethorphan Other (See Comments) 09/03/2016   Meclizine  04/20/2022   Versed [midazolam] Other (See Comments) 09/03/2016   Zofran [ondansetron]  04/20/2022   Antihistamines, diphenhydramine-type Anxiety 09/03/2016    Family History  Problem Relation Age of Onset   Hypertrophic cardiomyopathy Mother    Arrhythmia Mother    Heart failure Mother    Pneumonia Father    Diabetes Father    Colon polyps Father    Heart attack Brother    Sleep apnea Brother    Hypertrophic cardiomyopathy Brother    Heart Problems Brother        VAD    Arrhythmia Brother    Heart Problems Brother    Diabetes Brother    Sudden death Brother    Lung disease Neg Hx     Social History   Socioeconomic History   Marital status: Married    Spouse name: Not on file   Number of children: 2   Years of education: Not on file   Highest education level: Not on file  Occupational History   Occupation: retired  Tobacco Use   Smoking status: Former    Current packs/day: 0.00    Average packs/day: 2.0 packs/day for 20.0 years (40.0 ttl pk-yrs)    Types: Cigarettes    Start date: 07/06/1961    Quit date: 07/06/1981    Years since quitting: 42.1   Smokeless tobacco: Never   Tobacco comments:    Former smoker 10/21/21  Vaping Use   Vaping status: Never Used  Substance and Sexual Activity   Alcohol use: Not Currently    Comment: stop drinking 2 glasses of wine since ablation 10/21/21   Drug use: No   Sexual activity: Not on file  Other Topics Concern   Not on file  Social History Narrative   Originally from [place]. Moved to [place] in 2012. Currently has no pets. No bird or hot tub exposure. 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 He did have exposure to Beneze, Acetone, Styrene, & Hydrocarbon early in his career.   Social Determinants of Health   Financial Resource Strain: Not on file  Food Insecurity: No Food Insecurity (08/14/2022)   Hunger Vital Sign    Worried About Running Out of Food in the Last Year: Never true    Ran Out of Food in the Last Year: Never true  Transportation Needs: No Transportation Needs (08/14/2022)   PRAPARE - Administrator, Civil Service (Medical): No    Lack of Transportation (Non-Medical): No  Physical Activity: Not on file  Stress: Not on file  Social Connections: Not on file  Intimate Partner Violence: Not At Risk (08/14/2022)   Humiliation, Afraid, Rape, and Kick questionnaire    Fear of Current or Ex-Partner: No    Emotionally Abused: No    Physically  Abused: No  Sexually Abused: No    Physical Exam: Vital signs in last 24 hours: @BP  (!) 139/98   Pulse (!) 118   Temp (!) 97.4 F (36.3 C) (Skin)   Ht 5\' 10"  (1.778 m)   Wt 173 lb (78.5 kg)   SpO2 98%   BMI 24.82 kg/m  GEN: NAD EYE: Sclerae anicteric ENT: MMM CV: Non-tachycardic Pulm: CTA b/l GI: Soft, NT/ND NEURO:  Alert & Oriented x 3   Erick Blinks, MD [facility]  08/23/2023 3:48 PM

## 2023-08-23 NOTE — Progress Notes (Unsigned)
1555 HR > 100 with esmolol 25 mg given IV, MD updated, vss 

## 2023-08-23 NOTE — Op Note (Signed)
[facility] Patient Name: Joseph Morrow Procedure Date: 08/23/2023 3:41 PM MRN: [id number] Endoscopist: Beverley Fiedler , MD, [id number] Age: 77 Referring MD:  Date of Birth: 20-Dec-1945 Gender: Male Account #: [id number] Procedure:                Colonoscopy Indications:              High risk colon cancer surveillance: Personal                            history of multiple adenomas, Last colonoscopy:                            January 2021 (TA x 6), 2015 (TA x 1) Medicines:                Monitored Anesthesia Care Procedure:                Pre-Anesthesia Assessment:                           - Prior to the procedure, a History and Physical                            was performed, and patient medications and                            allergies were reviewed. The patient's tolerance of                            previous anesthesia was also reviewed. The risks                            and benefits of the procedure and the sedation                            options and risks were discussed with the patient.                            All questions were answered, and informed consent                            was obtained. Prior Anticoagulants: The patient has                            taken Eliquis (apixaban), last dose was 2 days                            prior to procedure. ASA Grade Assessment: III - A                            patient with severe systemic disease. After                            reviewing the risks and benefits, the patient was  deemed in satisfactory condition to undergo the                            procedure.                           After obtaining informed consent, the colonoscope                            was passed under direct vision. Throughout the                            procedure, the patient's blood pressure, pulse, and                            oxygen saturations were monitored continuously. The                             Olympus Scope SN [id number] was introduced through the                            anus and advanced to the cecum, identified by                            appendiceal orifice and ileocecal valve. The                            colonoscopy was performed without difficulty. The                            patient tolerated the procedure well. The quality                            of the bowel preparation was good. The ileocecal                            valve, appendiceal orifice, and rectum were                            photographed. Scope In: 3:57:11 PM Scope Out: 4:10:06 PM Scope Withdrawal Time: 0 hours 8 minutes 44 seconds  Total Procedure Duration: 0 hours 12 minutes 55 seconds  Findings:                 Skin tags were found on perianal exam.                           A 6 mm polyp was found in the hepatic flexure. The                            polyp was sessile. The polyp was removed with a                            cold snare. Resection and retrieval were complete.  A 3 mm polyp was found in the transverse colon. The                            polyp was sessile. The polyp was removed with a                            cold snare. Resection and retrieval were complete.                           Internal hemorrhoids were found during retroflexion                            and during digital exam. The hemorrhoids were                            medium-sized. Complications:            No immediate complications. Estimated Blood Loss:     Estimated blood loss was minimal. Impression:               - Perianal skin tags found on perianal exam.                           - One 6 mm polyp at the hepatic flexure, removed                            with a cold snare. Resected and retrieved.                           - One 3 mm polyp in the transverse colon, removed                            with a cold snare. Resected and retrieved.                            - Internal hemorrhoids and anal papillomas (skin                            tags). Recommendation:           - Patient has a contact number available for                            emergencies. The signs and symptoms of potential                            delayed complications were discussed with the                            patient. Return to normal activities tomorrow.                            Written discharge instructions were provided to the  patient.                           - Resume previous diet.                           - Continue present medications.                           - Await pathology results.                           - No recommendation at this time regarding repeat                            colonoscopy due to age at next surveillance                            interval (5 years).                           - Resume Eliquis (apixaban) at prior dose today.                            Refer to managing physician for further adjustment                            of therapy. Beverley Fiedler, MD 08/23/2023 4:15:05 PM This report has been signed electronically.

## 2023-08-23 NOTE — Patient Instructions (Signed)
Please read handouts provided. Continue present medications. Await pathology results. Resume Eliquis ( apixaban ) at prior dose today. Resume previous diet.   YOU HAD AN ENDOSCOPIC PROCEDURE TODAY AT THE [facility]:   Refer to the procedure report that was given to you for any specific questions about what was found during the examination.  If the procedure report does not answer your questions, please call your gastroenterologist to clarify.  If you requested that your care partner not be given the details of your procedure findings, then the procedure report has been included in a sealed envelope for you to review at your convenience later.  YOU SHOULD EXPECT: Some feelings of bloating in the abdomen. Passage of more gas than usual.  Walking can help get rid of the air that was put into your GI tract during the procedure and reduce the bloating. If you had a lower endoscopy (such as a colonoscopy or flexible sigmoidoscopy) you may notice spotting of blood in your stool or on the toilet paper. If you underwent a bowel prep for your procedure, you may not have a normal bowel movement for a few days.  Please Note:  You might notice some irritation and congestion in your nose or some drainage.  This is from the oxygen used during your procedure.  There is no need for concern and it should clear up in a day or so.  SYMPTOMS TO REPORT IMMEDIATELY:  Following lower endoscopy (colonoscopy or flexible sigmoidoscopy):  Excessive amounts of blood in the stool  Significant tenderness or worsening of abdominal pains  Swelling of the abdomen that is new, acute  Fever of 100F or higher.  For urgent or emergent issues, a gastroenterologist can be reached at any hour by calling [phone]. Do not use MyChart messaging for urgent concerns.    DIET:  We do recommend a small meal at first, but then you may proceed to your regular diet.  Drink plenty of fluids but you should avoid alcoholic  beverages for 24 hours.  ACTIVITY:  You should plan to take it easy for the rest of today and you should NOT DRIVE or use heavy machinery until tomorrow (because of the sedation medicines used during the test).    FOLLOW UP: Our staff will call the number listed on your records the next business day following your procedure.  We will call around 7:15- 8:00 am to check on you and address any questions or concerns that you may have regarding the information given to you following your procedure. If we do not reach you, we will leave a message.     If any biopsies were taken you will be contacted by phone or by letter within the next 1-3 weeks.  Please call us at [phone] if you have not heard about the biopsies in 3 weeks.    SIGNATURES/CONFIDENTIALITY: You and/or your care partner have signed paperwork which will be entered into your electronic medical record.  These signatures attest to the fact that that the information above on your After Visit Summary has been reviewed and is understood.  Full responsibility of the confidentiality of this discharge information lies with you and/or your care-partner.

## 2023-08-23 NOTE — Progress Notes (Unsigned)
1557 HR > 100 with esmolol 25 mg given IV, MD updated, vss

## 2023-08-23 NOTE — Progress Notes (Unsigned)
Pt's states no medical or surgical changes since previsit or office visit. 

## 2023-08-23 NOTE — Progress Notes (Unsigned)
Report given to PACU, vss 

## 2023-08-24 ENCOUNTER — Telehealth: Payer: Self-pay

## 2023-08-24 NOTE — Telephone Encounter (Signed)
  Follow up Call-     08/23/2023    3:23 PM  Call back number  Post procedure Call Back phone  # [phone]  Permission to leave phone message Yes     Patient questions:  Do you have a fever, pain , or abdominal swelling? No. Pain Score  0 *  Have you tolerated food without any problems? Yes.    Have you been able to return to your normal activities? Yes.    Do you have any questions about your discharge instructions: Diet   No. Medications  No. Follow up visit  No.  Do you have questions or concerns about your Care? No.  Actions: * If pain score is 4 or above: No action needed, pain <4.

## 2023-08-25 DIAGNOSIS — M5459 Other low back pain: Secondary | ICD-10-CM | POA: Diagnosis not present

## 2023-08-25 DIAGNOSIS — M542 Cervicalgia: Secondary | ICD-10-CM | POA: Diagnosis not present

## 2023-08-25 DIAGNOSIS — M9902 Segmental and somatic dysfunction of thoracic region: Secondary | ICD-10-CM | POA: Diagnosis not present

## 2023-08-25 DIAGNOSIS — R293 Abnormal posture: Secondary | ICD-10-CM | POA: Diagnosis not present

## 2023-08-25 DIAGNOSIS — M9903 Segmental and somatic dysfunction of lumbar region: Secondary | ICD-10-CM | POA: Diagnosis not present

## 2023-08-25 DIAGNOSIS — M9901 Segmental and somatic dysfunction of cervical region: Secondary | ICD-10-CM | POA: Diagnosis not present

## 2023-08-25 DIAGNOSIS — M5137 Other intervertebral disc degeneration, lumbosacral region with discogenic back pain only: Secondary | ICD-10-CM | POA: Diagnosis not present

## 2023-08-25 DIAGNOSIS — M791 Myalgia, unspecified site: Secondary | ICD-10-CM | POA: Diagnosis not present

## 2023-08-25 DIAGNOSIS — M50222 Other cervical disc displacement at C5-C6 level: Secondary | ICD-10-CM | POA: Diagnosis not present

## 2023-08-26 ENCOUNTER — Telehealth (HOSPITAL_COMMUNITY): Payer: Self-pay | Admitting: *Deleted

## 2023-08-26 LAB — SURGICAL PATHOLOGY

## 2023-08-26 NOTE — Telephone Encounter (Signed)
Pt called in stating for the last week he has not been feeling as well. More short of breath and has noticed elevation of heart rates. He has sent a transmission will await results to determine plan. Pt states he has had to take lasix daily for the last 7 days. Last night he had orthopnea but unsure of how much his weight he is up.

## 2023-08-26 NOTE — Telephone Encounter (Signed)
Patient has recent increase in AF burden with increased ventricular rates.

## 2023-08-26 NOTE — Telephone Encounter (Signed)
Pt notified of transmission results. Will bring in for assessment. Pt was off Eliquis for 2 days due to colonoscopy on Monday December 9th resumed that evening. His HRs at the colonoscopy was 118 appears IV BB was given at time of procedure. Pt is up 4lbs from normal weight. Per [name] PA will give extra 20mg  of lasix today and follow up scheduled for tomorrow. Pt in agreement.

## 2023-08-27 ENCOUNTER — Encounter: Payer: Self-pay | Admitting: Cardiology

## 2023-08-27 ENCOUNTER — Ambulatory Visit (HOSPITAL_COMMUNITY)
Admission: RE | Admit: 2023-08-27 | Discharge: 2023-08-27 | Disposition: A | Discharge status: Home / Self Care | Payer: Medicare Other | Source: Ambulatory Visit | Attending: Physician Assistant | Admitting: Physician Assistant

## 2023-08-27 ENCOUNTER — Encounter (HOSPITAL_COMMUNITY): Payer: Self-pay | Admitting: Physician Assistant

## 2023-08-27 VITALS — BP 108/90 | HR 84 | Ht 70.0 in | Wt 180.2 lb

## 2023-08-27 DIAGNOSIS — Z95810 Presence of automatic (implantable) cardiac defibrillator: Secondary | ICD-10-CM | POA: Diagnosis not present

## 2023-08-27 DIAGNOSIS — I472 Ventricular tachycardia, unspecified: Secondary | ICD-10-CM | POA: Diagnosis not present

## 2023-08-27 DIAGNOSIS — I484 Atypical atrial flutter: Secondary | ICD-10-CM | POA: Insufficient documentation

## 2023-08-27 DIAGNOSIS — Z5181 Encounter for therapeutic drug level monitoring: Secondary | ICD-10-CM

## 2023-08-27 DIAGNOSIS — Z7901 Long term (current) use of anticoagulants: Secondary | ICD-10-CM | POA: Diagnosis not present

## 2023-08-27 DIAGNOSIS — I4819 Other persistent atrial fibrillation: Secondary | ICD-10-CM | POA: Insufficient documentation

## 2023-08-27 DIAGNOSIS — D6869 Other thrombophilia: Secondary | ICD-10-CM | POA: Insufficient documentation

## 2023-08-27 DIAGNOSIS — Z79899 Other long term (current) drug therapy: Secondary | ICD-10-CM | POA: Diagnosis not present

## 2023-08-27 DIAGNOSIS — R0602 Shortness of breath: Secondary | ICD-10-CM | POA: Insufficient documentation

## 2023-08-27 LAB — CBC
HCT: 47.7 % (ref 39.0–52.0)
Hemoglobin: 15.5 g/dL (ref 13.0–17.0)
MCH: 29 pg (ref 26.0–34.0)
MCHC: 32.5 g/dL (ref 30.0–36.0)
MCV: 89.3 fL (ref 80.0–100.0)
Platelets: 180 10*3/uL (ref 150–400)
RBC: 5.34 MIL/uL (ref 4.22–5.81)
RDW: 14.6 % (ref 11.5–15.5)
WBC: 6.7 10*3/uL (ref 4.0–10.5)
nRBC: 0 % (ref 0.0–0.2)

## 2023-08-27 LAB — BASIC METABOLIC PANEL
Anion gap: 6 (ref 5–15)
BUN: 23 mg/dL (ref 8–23)
CO2: 27 mmol/L (ref 22–32)
Calcium: 9.2 mg/dL (ref 8.9–10.3)
Chloride: 107 mmol/L (ref 98–111)
Creatinine, Ser: 1.7 mg/dL — ABNORMAL HIGH (ref 0.61–1.24)
GFR, Estimated: 41 mL/min — ABNORMAL LOW (ref >60)
Glucose, Bld: 81 mg/dL (ref 70–99)
Potassium: 4.2 mmol/L (ref 3.5–5.1)
Sodium: 140 mmol/L (ref 135–145)

## 2023-08-27 MED ORDER — DILTIAZEM HCL 30 MG PO TABS: ORAL_TABLET | ORAL | 3 refills | Status: AC

## 2023-08-27 NOTE — Telephone Encounter (Signed)
Spoke with the patient who is very upset that he found out that he has been in A Fib since November and he was allowed to hold his Eliquis prior to his colonoscopy. He had a colonoscopy on 12/5 and was cleared to hold his Eliquis for two days prior. See telephone note from 9/27.  Patient feels that this was very inappropriate that we cleared him to hold his Eliquis when he was in A Fib. He states that we should have seen this on his monitor prior to his procedure and should not have allowed him to hold his Eliquis.  He feels that we put him at significant risk for stroke. I explained to the patient how his device is monitored. Also explained how we calculate a patient's risk of stroke and the pre-op clearance process for patient's with atrial fibrillation to be able to hold their blood thinners prior to procedures. Patient was not satisfied with this and stated that our process was wrong. He would not allow me to explain any further and continued to be argumentative. Advised that I would notify my manager of his complaints.

## 2023-08-27 NOTE — Progress Notes (Signed)
Primary Care Physician: Cleatis Polka., MD Referring Physician:Dr. 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 male with a h/o hypertrophic cardiomyopathy, s/p ICD for VT, atrial fibrillation, maintaining SR with a very small afib burden on Sotalol. Hospitalized for sotalol administration 02/2017. S/p afib ablation 07/04/20 and 09/18/21. Eventually loaded on amiodarone.   On follow up today, patient called the clinic 12/12 with more SOB and elevated heart rates. Has has also used more of his PRN Lasix. Device transmission showed he has been persistently out of rhythm since mid November. He is in atrial flutter today. There were no specific triggers that he could identify. He did have to hold his Eliquis for a colonoscopy on 12/9. First full day back on Eliquis was 12/10.  Today, he denies symptoms of palpitations, chest pain, orthopnea, PND, dizziness, presyncope, syncope, or neurologic sequela.The patient is tolerating medications without difficulties and is otherwise without complaint today.   Past Medical History:  Diagnosis Date   Acute cholecystitis 06/21/2011   Allergy    SEASONAL   Cataract    Complication of anesthesia    HALLUCINATIONS   ED (erectile dysfunction)    GERD (gastroesophageal reflux disease)    Hearing loss    Helicobacter pylori gastritis 06/2014   Hemorrhoids    Hiatal hernia    Hx of adenomatous colonic polyps 06/2014   Hypertrophic cardiomyopathy (HCC)    Mitral regurgitation    Moderate   Osteopenia    Paroxysmal atrial fibrillation (HCC) 04/2015   documented on ICD remote interrogation, treated with sotalol   Septal defect    LV Septal hyptertrophy 18mm, normal EF, minimal LVOT gradiant.   Tubular adenoma of colon    VT (ventricular tachycardia) (HCC) 08/18/2013   s/p St. Jude ICD   ROS- All systems are reviewed and negative except as per the HPI above  Physical Exam: Vitals:   08/27/23 1000  BP: (!) 108/90  Pulse:  84  Weight: 81.7 kg  Height: 5\' 10"  (1.778 m)    Wt Readings from Last 3 Encounters:  08/27/23 81.7 kg  08/23/23 78.5 kg  06/17/23 79.8 kg    GEN: Well nourished, well developed in no acute distress NECK: No JVD; No carotid bruits CARDIAC: Irregularly irregular rate and rhythm, no murmurs, rubs, gallops RESPIRATORY:  Clear to auscultation without rales, wheezing or rhonchi  ABDOMEN: Soft, non-tender, non-distended EXTREMITIES:  Trace edema R leg, 1+ edema L leg, No deformity    ECG today demonstrates  Atrial flutter with intermittent V pacing and PVCs Vent. rate 84 BPM PR interval 152 ms QRS duration 92 ms QT/QTcB 402/475 ms   Echo 08/23/19  1. Hypertrophic cardiomyopathy, sigmoid subtype. Basal septum measures 18 mm in maximal dimension. Flow acceleration noted at the LVOT, no LVOT obstruction noted. Chordal systolic anterior motion of the mitral valve without involvment of the anterior leaflet.   2. Left ventricular ejection fraction, by visual estimation, is 60 to  65%. The left ventricle has normal function. There is severely increased  left ventricular hypertrophy.   3. Elevated left atrial and left ventricular end-diastolic pressures.   4. Left ventricular diastolic parameters are consistent with Grade III  diastolic dysfunction (restrictive).   5. Global right ventricle has normal systolic function.The right  ventricular size is normal. No increase in right ventricular wall  thickness.   6. Left atrial size was moderately dilated.   7. Right atrial size was moderately dilated.  8. The mitral valve is normal in structure. Mild mitral valve  regurgitation. No evidence of mitral stenosis.   9. The tricuspid valve is normal in structure. Tricuspid valve  regurgitation is mild.  10. The aortic valve is tricuspid. Aortic valve regurgitation is not  visualized. No evidence of aortic valve sclerosis or stenosis.  11. The pulmonic valve was normal in structure. Pulmonic  valve  regurgitation is mild.  12. Moderately elevated pulmonary artery systolic pressure.  13. The inferior vena cava is dilated in size with <50% respiratory  variability, suggesting right atrial pressure of 15 mmHg.  14. The left ventricle has no regional wall motion abnormalities.    CHA2DS2-VASc Score = 3  The patient's score is based upon: CHF History: 0 HTN History: 0 Diabetes History: 0 Stroke History: 0 Vascular Disease History: 1 (aoritc atherosclerosis) Age Score: 2 Gender Score: 0       ASSESSMENT AND PLAN: Persistent Atrial Fibrillation/atrial flutter The patient's CHA2DS2-VASc score is 3, indicating a 3.2% annual risk of stroke.   S/p ablation 06/2020 with redo ablation 09/2021   We discussed rhythm control options today. Will plan for DCCV after 3 weeks of uninterrupted anticoagulation. Can consider TEE/DCCV if his symptoms worsen. Check bmet/cbc today Continue amiodarone 200 mg daily Continue Eliquis 5 mg BID Continue Toprol 50 mg daily Continue diltiazem 30 mg PRN q 4 hours for heart racing.  Secondary Hypercoagulable State (ICD10:  D68.69) The patient is at significant risk for stroke/thromboembolism based upon his CHA2DS2-VASc Score of 3.  Continue Apixaban (Eliquis).   HCM St Jude -Merlin ICD, followed by Dr Lalla Brothers and the device clinic Patient will continue PRN Lasix. Check bmet today.  VT S/p ICD On amiodarone 200 mg daily   Follow up in the AF clinic post DCCV.    [name] PA-C Afib [facility] [address] [place], [place] [address] [phone]

## 2023-08-27 NOTE — Telephone Encounter (Signed)
Inbound call from patient requesting to discuss clearance of Eliquis. States he feels that he should have not have been off of his Eliquis for 2 days. Requesting a call to discuss further. Please advise, thank you.

## 2023-08-27 NOTE — Patient Instructions (Signed)
Cardioversion scheduled for: Thursday, January 2nd   - Arrive at the [facility] and go to admitting at [facility] not eat or drink anything after midnight the night prior to your procedure.   - Take all your morning medication (except diabetic medications) with a sip of water prior to arrival.  - You will not be able to drive home after your procedure.    - Do NOT miss any doses of your blood thinner - if you should miss a dose please notify our office immediately.   - If you feel as if you go back into normal rhythm prior to scheduled cardioversion, please notify our office immediately.   If your procedure is canceled in the cardioversion suite you will be charged a cancellation fee.

## 2023-08-27 NOTE — H&P (View-Only) (Signed)
 Primary Care Physician: Cleatis Polka., MD Referring Physician:Dr. 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 male with a h/o hypertrophic cardiomyopathy, s/p ICD for VT, atrial fibrillation, maintaining SR with a very small afib burden on Sotalol. Hospitalized for sotalol administration 02/2017. S/p afib ablation 07/04/20 and 09/18/21. Eventually loaded on amiodarone.   On follow up today, patient called the clinic 12/12 with more SOB and elevated heart rates. Has has also used more of his PRN Lasix. Device transmission showed he has been persistently out of rhythm since mid November. He is in atrial flutter today. There were no specific triggers that he could identify. He did have to hold his Eliquis for a colonoscopy on 12/9. First full day back on Eliquis was 12/10.  Today, he denies symptoms of palpitations, chest pain, orthopnea, PND, dizziness, presyncope, syncope, or neurologic sequela.The patient is tolerating medications without difficulties and is otherwise without complaint today.   Past Medical History:  Diagnosis Date   Acute cholecystitis 06/21/2011   Allergy    SEASONAL   Cataract    Complication of anesthesia    HALLUCINATIONS   ED (erectile dysfunction)    GERD (gastroesophageal reflux disease)    Hearing loss    Helicobacter pylori gastritis 06/2014   Hemorrhoids    Hiatal hernia    Hx of adenomatous colonic polyps 06/2014   Hypertrophic cardiomyopathy (HCC)    Mitral regurgitation    Moderate   Osteopenia    Paroxysmal atrial fibrillation (HCC) 04/2015   documented on ICD remote interrogation, treated with sotalol   Septal defect    LV Septal hyptertrophy 18mm, normal EF, minimal LVOT gradiant.   Tubular adenoma of colon    VT (ventricular tachycardia) (HCC) 08/18/2013   s/p St. Jude ICD   ROS- All systems are reviewed and negative except as per the HPI above  Physical Exam: Vitals:   08/27/23 1000  BP: (!) 108/90  Pulse:  84  Weight: 81.7 kg  Height: 5\' 10"  (1.778 m)    Wt Readings from Last 3 Encounters:  08/27/23 81.7 kg  08/23/23 78.5 kg  06/17/23 79.8 kg    GEN: Well nourished, well developed in no acute distress NECK: No JVD; No carotid bruits CARDIAC: Irregularly irregular rate and rhythm, no murmurs, rubs, gallops RESPIRATORY:  Clear to auscultation without rales, wheezing or rhonchi  ABDOMEN: Soft, non-tender, non-distended EXTREMITIES:  Trace edema R leg, 1+ edema L leg, No deformity    ECG today demonstrates  Atrial flutter with intermittent V pacing and PVCs Vent. rate 84 BPM PR interval 152 ms QRS duration 92 ms QT/QTcB 402/475 ms   Echo 08/23/19  1. Hypertrophic cardiomyopathy, sigmoid subtype. Basal septum measures 18 mm in maximal dimension. Flow acceleration noted at the LVOT, no LVOT obstruction noted. Chordal systolic anterior motion of the mitral valve without involvment of the anterior leaflet.   2. Left ventricular ejection fraction, by visual estimation, is 60 to  65%. The left ventricle has normal function. There is severely increased  left ventricular hypertrophy.   3. Elevated left atrial and left ventricular end-diastolic pressures.   4. Left ventricular diastolic parameters are consistent with Grade III  diastolic dysfunction (restrictive).   5. Global right ventricle has normal systolic function.The right  ventricular size is normal. No increase in right ventricular wall  thickness.   6. Left atrial size was moderately dilated.   7. Right atrial size was moderately dilated.  8. The mitral valve is normal in structure. Mild mitral valve  regurgitation. No evidence of mitral stenosis.   9. The tricuspid valve is normal in structure. Tricuspid valve  regurgitation is mild.  10. The aortic valve is tricuspid. Aortic valve regurgitation is not  visualized. No evidence of aortic valve sclerosis or stenosis.  11. The pulmonic valve was normal in structure. Pulmonic  valve  regurgitation is mild.  12. Moderately elevated pulmonary artery systolic pressure.  13. The inferior vena cava is dilated in size with <50% respiratory  variability, suggesting right atrial pressure of 15 mmHg.  14. The left ventricle has no regional wall motion abnormalities.    CHA2DS2-VASc Score = 3  The patient's score is based upon: CHF History: 0 HTN History: 0 Diabetes History: 0 Stroke History: 0 Vascular Disease History: 1 (aoritc atherosclerosis) Age Score: 2 Gender Score: 0       ASSESSMENT AND PLAN: Persistent Atrial Fibrillation/atrial flutter The patient's CHA2DS2-VASc score is 3, indicating a 3.2% annual risk of stroke.   S/p ablation 06/2020 with redo ablation 09/2021   We discussed rhythm control options today. Will plan for DCCV after 3 weeks of uninterrupted anticoagulation. Can consider TEE/DCCV if his symptoms worsen. Check bmet/cbc today Continue amiodarone 200 mg daily Continue Eliquis 5 mg BID Continue Toprol 50 mg daily Continue diltiazem 30 mg PRN q 4 hours for heart racing.  Secondary Hypercoagulable State (ICD10:  D68.69) The patient is at significant risk for stroke/thromboembolism based upon his CHA2DS2-VASc Score of 3.  Continue Apixaban (Eliquis).   HCM St Jude -Merlin ICD, followed by Dr Lalla Brothers and the device clinic Patient will continue PRN Lasix. Check bmet today.  VT S/p ICD On amiodarone 200 mg daily   Follow up in the AF clinic post DCCV.    [name] PA-C Af[facility] [address] [place], [place] [address] [phone]

## 2023-08-27 NOTE — Telephone Encounter (Signed)
Spoke with patient and he stated that he should have never been allowed to hold Eliquis. Patient was very upset and feels that he should be able to have letter stating that he could hold Eliquis. Patient aske for my name and the name of who sent the letter at Cardiology.

## 2023-08-30 ENCOUNTER — Encounter: Payer: Self-pay | Admitting: Internal Medicine

## 2023-08-30 NOTE — Telephone Encounter (Signed)
Appropriate cardiology clearance was obtained and we have documentation of the same Patient apparently upset, though to my knowledge did not have peri-procedural complications while Eliquis was briefly interrupted for endoscopy Sheri, please advise on if we can send the cardiology clearance to the patient or does he need to request his records? I can see he has also been in contact with cardiology JMP

## 2023-08-31 NOTE — Telephone Encounter (Signed)
This encounter was created in error - please disregard.

## 2023-09-03 ENCOUNTER — Ambulatory Visit (INDEPENDENT_AMBULATORY_CARE_PROVIDER_SITE_OTHER): Payer: Medicare Other

## 2023-09-03 DIAGNOSIS — I422 Other hypertrophic cardiomyopathy: Secondary | ICD-10-CM

## 2023-09-03 LAB — CUP PACEART REMOTE DEVICE CHECK
Battery Remaining Longevity: 28 mo
Battery Remaining Percentage: 30 %
Battery Voltage: 2.9 V
Brady Statistic AP VP Percent: 47 %
Brady Statistic AP VS Percent: 7.4 %
Brady Statistic AS VP Percent: 25 %
Brady Statistic AS VS Percent: 18 %
Brady Statistic RA Percent Paced: 30 %
Brady Statistic RV Percent Paced: 69 %
Date Time Interrogation Session: 20241220030434
HighPow Impedance: 44 Ohm
HighPow Impedance: 44 Ohm
Implantable Lead Connection Status: 753985
Implantable Lead Connection Status: 753985
Implantable Lead Implant Date: 20141205
Implantable Lead Implant Date: 20141205
Implantable Lead Location: 753859
Implantable Lead Location: 753860
Implantable Lead Model: 7121
Implantable Pulse Generator Implant Date: 20171221
Lead Channel Impedance Value: 400 Ohm
Lead Channel Impedance Value: 480 Ohm
Lead Channel Pacing Threshold Amplitude: 0.75 V
Lead Channel Pacing Threshold Amplitude: 1 V
Lead Channel Pacing Threshold Pulse Width: 0.5 ms
Lead Channel Pacing Threshold Pulse Width: 0.5 ms
Lead Channel Sensing Intrinsic Amplitude: 1.2 mV
Lead Channel Sensing Intrinsic Amplitude: 12 mV
Lead Channel Setting Pacing Amplitude: 2 V
Lead Channel Setting Pacing Amplitude: 2.5 V
Lead Channel Setting Pacing Pulse Width: 0.5 ms
Lead Channel Setting Sensing Sensitivity: 0.5 mV
Pulse Gen Serial Number: 7387049
Zone Setting Status: 755011

## 2023-09-06 ENCOUNTER — Other Ambulatory Visit (HOSPITAL_COMMUNITY): Payer: Self-pay | Admitting: *Deleted

## 2023-09-06 ENCOUNTER — Telehealth (HOSPITAL_COMMUNITY): Payer: Self-pay | Admitting: *Deleted

## 2023-09-06 DIAGNOSIS — N1831 Chronic kidney disease, stage 3a: Secondary | ICD-10-CM | POA: Diagnosis not present

## 2023-09-06 DIAGNOSIS — R7301 Impaired fasting glucose: Secondary | ICD-10-CM | POA: Diagnosis not present

## 2023-09-06 DIAGNOSIS — I4819 Other persistent atrial fibrillation: Secondary | ICD-10-CM

## 2023-09-06 DIAGNOSIS — E781 Pure hyperglyceridemia: Secondary | ICD-10-CM | POA: Diagnosis not present

## 2023-09-06 DIAGNOSIS — I700 Atherosclerosis of aorta: Secondary | ICD-10-CM | POA: Diagnosis not present

## 2023-09-06 DIAGNOSIS — M8580 Other specified disorders of bone density and structure, unspecified site: Secondary | ICD-10-CM | POA: Diagnosis not present

## 2023-09-06 DIAGNOSIS — Z125 Encounter for screening for malignant neoplasm of prostate: Secondary | ICD-10-CM | POA: Diagnosis not present

## 2023-09-06 MED ORDER — FUROSEMIDE 20 MG PO TABS: 20.0000 mg | ORAL_TABLET | Freq: Every day | ORAL | 4 refills | Status: DC | PRN

## 2023-09-06 NOTE — Telephone Encounter (Signed)
Patient called in stating he is unsure he can continue tolerating afib to wait 3 weeks uninterrupted Eliquis load. Discussed with [name] PA will add TEE to DCCV. TEE/DCCV moved to 12/26. Pt in agreement.

## 2023-09-07 NOTE — Progress Notes (Signed)
 Spoke to patient and instructed them to come at 09:00  and to be NPO after 0000. Medications reviewed.   Confirmed that patient will have a ride home and someone to stay with them for 24 hours after the procedure.

## 2023-09-09 ENCOUNTER — Ambulatory Visit (HOSPITAL_COMMUNITY): Payer: Medicare Other | Admitting: Anesthesiology

## 2023-09-09 ENCOUNTER — Encounter (HOSPITAL_COMMUNITY)
Admission: RE | Disposition: A | Discharge status: Home / Self Care | Payer: Self-pay | Source: Home / Self Care | Attending: Internal Medicine

## 2023-09-09 ENCOUNTER — Telehealth: Payer: Self-pay

## 2023-09-09 ENCOUNTER — Ambulatory Visit (HOSPITAL_COMMUNITY): Payer: Medicare Other

## 2023-09-09 ENCOUNTER — Ambulatory Visit (HOSPITAL_COMMUNITY)
Admission: RE | Admit: 2023-09-09 | Discharge: 2023-09-09 | Disposition: A | Discharge status: Home / Self Care | Payer: Medicare Other | Attending: Internal Medicine | Admitting: Internal Medicine

## 2023-09-09 ENCOUNTER — Encounter (HOSPITAL_COMMUNITY): Payer: Self-pay | Admitting: Internal Medicine

## 2023-09-09 ENCOUNTER — Other Ambulatory Visit: Payer: Self-pay

## 2023-09-09 DIAGNOSIS — I4819 Other persistent atrial fibrillation: Secondary | ICD-10-CM

## 2023-09-09 DIAGNOSIS — I484 Atypical atrial flutter: Secondary | ICD-10-CM

## 2023-09-09 SURGERY — TRANSESOPHAGEAL ECHOCARDIOGRAM (TEE) (CATHLAB)
Anesthesia: General

## 2023-09-09 MED ORDER — SODIUM CHLORIDE 0.9% FLUSH
10.0000 mL | Freq: Two times a day (BID) | INTRAVENOUS | Status: DC
  Administered 2023-09-09: 10 mL via INTRAVENOUS

## 2023-09-09 MED ORDER — AMIODARONE HCL 200 MG PO TABS: 200.0000 mg | ORAL_TABLET | Freq: Two times a day (BID) | ORAL | 0 refills | Status: DC

## 2023-09-09 MED ORDER — SODIUM CHLORIDE 0.9% FLUSH: 3.0000 mL | Freq: Two times a day (BID) | INTRAVENOUS | Status: DC

## 2023-09-09 MED ORDER — AMIODARONE HCL 200 MG PO TABS
200.0000 mg | ORAL_TABLET | Freq: Two times a day (BID) | ORAL | Status: DC
  Filled 2023-09-09: qty 1

## 2023-09-09 MED ORDER — SODIUM CHLORIDE 0.9% FLUSH: 3.0000 mL | INTRAVENOUS | Status: DC | PRN

## 2023-09-09 NOTE — Anesthesia Preprocedure Evaluation (Addendum)
Anesthesia Evaluation  Patient identified by MRN, date of birth, ID band Patient awake    Reviewed: Allergy & Precautions, H&P , NPO status , Patient's Chart, lab work & pertinent test results, reviewed documented beta blocker date and time   Airway Mallampati: II  TM Distance: >3 FB Neck ROM: Full    Dental no notable dental hx. (+) Teeth Intact, Dental Advisory Given   Pulmonary former smoker   Pulmonary exam normal breath sounds clear to auscultation       Cardiovascular + dysrhythmias Atrial Fibrillation and Ventricular Tachycardia + Cardiac Defibrillator  Rhythm:Irregular Rate:Normal     Neuro/Psych negative neurological ROS  negative psych ROS   GI/Hepatic Neg liver ROS, hiatal hernia,GERD  Medicated,,  Endo/Other  negative endocrine ROS    Renal/GU Renal InsufficiencyRenal disease  negative genitourinary   Musculoskeletal   Abdominal   Peds  Hematology negative hematology ROS (+)   Anesthesia Other Findings   Reproductive/Obstetrics negative OB ROS                             Anesthesia Physical Anesthesia Plan  ASA: 3  Anesthesia Plan: General   Post-op Pain Management: Minimal or no pain anticipated   Induction: Intravenous  PONV Risk Score and Plan: 2 and Propofol infusion and Treatment may vary due to age or medical condition  Airway Management Planned: Natural Airway and Simple Face Mask  Additional Equipment:   Intra-op Plan:   Post-operative Plan:   Informed Consent: I have reviewed the patients History and Physical, chart, labs and discussed the procedure including the risks, benefits and alternatives for the proposed anesthesia with the patient or authorized representative who has indicated his/her understanding and acceptance.     Dental advisory given  Plan Discussed with: CRNA  Anesthesia Plan Comments: (Case cancelled by Cardiology)        Anesthesia Quick Evaluation

## 2023-09-09 NOTE — Telephone Encounter (Signed)
Sent in prescription for Dr Tenny Craw, increasing amiodarone to 200 mg twice daily - patient has follow up scheduled at AF clinic on 10/07/23     ----- Message from Dietrich Pates sent at 09/09/2023 11:06 AM EST ----- Pt presented for TEE / Cardioversion Device interrogated prior   On 09/07/23 he was in and out of sinus rhythm several times   Longest for 1 hour Because of this procedure cancelled    Recomm he increase dose of amiodarone to 200 bid    Has appt in afib clinic already set  Will need rx for higher dose of amiodarone    FYI--  Pt upset about 1. Not getting notified in November about fact that he was in afib pror to colonoscopy  He said he would not have stopped Eliquis then for 3 days   Told him that pts are not always notified of PAF events if on Eliquis   He had concerns about preop clinic /letter why it was not addressed   This is just an [place]   REviewed protocols with him.   Pts do go in /out all the time

## 2023-09-09 NOTE — Interval H&P Note (Signed)
History and Physical Interval Note:  09/09/2023 9:51 AM  Joseph Morrow  has presented today for surgery, with the diagnosis of AFIB.  The various methods of treatment have been discussed with the patient and family. After consideration of risks, benefits and other options for treatment, the patient has consented to  Procedure(s): TRANSESOPHAGEAL ECHOCARDIOGRAM (N/A) CARDIOVERSION (N/A) as a surgical intervention.  The patient's history has been reviewed, patient examined, no change in status, stable for surgery.  I have reviewed the patient's chart and labs.  Questions were answered to the patient's satisfaction.     Dietrich Pates

## 2023-09-09 NOTE — Telephone Encounter (Signed)
-----   Message from [place] sent at 09/09/2023 11:06 AM EST ----- Pt presented for TEE / Cardioversion Device interrogated prior   On 09/07/23 he was in and out of sinus rhythm several times   Longest for 1 hour Because of this procedure cancelled    Recomm he increase dose of amiodarone to 200 bid    Has appt in afib clinic already set  Will need rx for higher dose of amiodarone   FYI--  Pt upset about 1. Not getting notified in November about fact that he was in afib pror to colonoscopy  He said he would not have stopped Eliquis then for 3 days   Told him that pts are not always notified of PAF events if on Eliquis   He had concerns about preop clinic /letter why it was not addressed   This is just an [place]   REviewed protocols with him.   Pts do go in /out all the time

## 2023-09-09 NOTE — H&P (Signed)
CARDIOLOGY  Prior to TEE / Cardioversion, the patient's ICD was interrogated Readings show that patient was in probable atrial flutter  On 09/07/23 he went in and out of atrial flutter/sinus rhythm.   Longest continuous episode was for 1 hour    REverted back to flutter    REcommendation  Cancelling TEE/DCCV   REviewed reasons with pt  Recommend he increase amiodarone to 200 bid     Will notify Dr Lalla Brothers of above   re future plans   OK to d/c home   Dietrich Pates MD

## 2023-09-10 ENCOUNTER — Other Ambulatory Visit (HOSPITAL_COMMUNITY): Payer: Self-pay | Admitting: *Deleted

## 2023-09-10 MED ORDER — POTASSIUM CITRATE ER 10 MEQ (1080 MG) PO TBCR: 10.0000 meq | EXTENDED_RELEASE_TABLET | Freq: Every day | ORAL | 4 refills | Status: AC | PRN

## 2023-09-12 ENCOUNTER — Telehealth: Payer: Self-pay | Admitting: Cardiology

## 2023-09-12 NOTE — Telephone Encounter (Signed)
Called Joseph Morrow today to discuss anticoagulation and holding anticoagulation in the perioperative period.  Answered his questions regarding the process and data supporting perioperative anticoagulation management.  I do think he has been consistently out of rhythm since mid October despite his defibrillator reporting an AF burden of 41%.  This is secondary to fibrillatory wave under sensing.  I would like to reschedule him for TEE/cardioversion.  We will contact him tomorrow to get this scheduled.  He will continue uninterrupted anticoagulation.  Sheria Lang T. Lalla Brothers, MD, [facility], [facility] Cardiac Electrophysiology

## 2023-09-14 NOTE — Progress Notes (Signed)
 Pt called for pre procedure instructions. Arrival time 0700 NPO after midnight explained Instructed to take am meds with sip of water and confirmed blood thinner consistency Instructed pt need for ride home tomorrow and have responsible adult with them for 24 hrs post procedure.

## 2023-09-15 NOTE — Anesthesia Preprocedure Evaluation (Addendum)
 Anesthesia Evaluation  Patient identified by MRN, date of birth, ID band Patient awake    Reviewed: Allergy & Precautions, H&P , NPO status , Patient's Chart, lab work & pertinent test results  Airway Mallampati: III  TM Distance: >3 FB Neck ROM: Full    Dental  (+) Dental Advisory Given, Teeth Intact   Pulmonary sleep apnea , pneumonia, former smoker   Pulmonary exam normal breath sounds clear to auscultation       Cardiovascular + dysrhythmias Atrial Fibrillation and Ventricular Tachycardia + Cardiac Defibrillator + Valvular Problems/Murmurs (Mild-mod TR) MR  Rhythm:Irregular Rate:Normal  Echo 06/2021 1. Normal LV function. Left ventricular ejection fraction, by estimation,  is 60 to 65%. The left ventricle has normal function.   2. Right ventricular systolic function is normal. The right ventricular size is normal.   3. Left atrial size was moderately dilated. No left atrial/left atrial appendage thrombus was detected. The LAA emptying velocity was 30 cm/s.   4. Right atrial size was moderately dilated.   5. The mitral valve is grossly normal. Mild mitral valve regurgitation.   6. Device lead visualized, contributing to TR. Tricuspid valve regurgitation is mild to moderate.   7. The aortic valve is normal in structure. Aortic valve regurgitation is not visualized. No aortic stenosis is present.   8. Not well visualized.   Conclusion(s)/Recommendation(s): No LAA thrombus or spontaneous contrast.      Echo 08/2019  1. Hypertrophic cardiomyopathy, sigmoid subtype. Basal septum measures 18 mm in maximal dimension. Flow acceleration noted at the LVOT, no LVOT obstruction noted. Chordal systolic anterior motion of the mitral valve without involvment of the anterior leaflet.   2. Left ventricular ejection fraction, by visual estimation, is 60 to 65%. The left ventricle has normal function. There is severely increased left ventricular  hypertrophy.   3. Elevated left atrial and left ventricular end-diastolic pressures.   4. Left ventricular diastolic parameters are consistent with Grade III  diastolic dysfunction (restrictive).   5. Global right ventricle has normal systolic function.The right ventricular size is normal. No increase in right ventricular wall thickness.   6. Left atrial size was moderately dilated.   7. Right atrial size was moderately dilated.   8. The mitral valve is normal in structure. Mild mitral valve regurgitation. No evidence of mitral stenosis.   9. The tricuspid valve is normal in structure. Tricuspid valve regurgitation is mild.  10. The aortic valve is tricuspid. Aortic valve regurgitation is not visualized. No evidence of aortic valve sclerosis or stenosis.  11. The pulmonic valve was normal in structure. Pulmonic valve regurgitation is mild.  12. Moderately elevated pulmonary artery systolic pressure.  13. The inferior vena cava is dilated in size with <50% respiratory variability, suggesting right atrial pressure of 15 mmHg.  14. The left ventricle has no regional wall motion abnormalities.     Neuro/Psych  Neuromuscular disease  negative psych ROS   GI/Hepatic Neg liver ROS, hiatal hernia,GERD  Medicated and Controlled,,  Endo/Other  negative endocrine ROS    Renal/GU Renal InsufficiencyRenal disease     Musculoskeletal   Abdominal   Peds  Hematology negative hematology ROS (+)   Anesthesia Other Findings   Reproductive/Obstetrics negative OB ROS                             Anesthesia Physical Anesthesia Plan  ASA: 4  Anesthesia Plan: MAC   Post-op Pain Management: Minimal or no  pain anticipated   Induction: Intravenous  PONV Risk Score and Plan: 1 and Propofol  infusion, Treatment may vary due to age or medical condition and TIVA  Airway Management Planned: Natural Airway and Simple Face Mask  Additional Equipment:   Intra-op Plan:    Post-operative Plan:   Informed Consent: I have reviewed the patients History and Physical, chart, labs and discussed the procedure including the risks, benefits and alternatives for the proposed anesthesia with the patient or authorized representative who has indicated his/her understanding and acceptance.     Dental advisory given  Plan Discussed with: CRNA  Anesthesia Plan Comments:        Anesthesia Quick Evaluation

## 2023-09-16 ENCOUNTER — Encounter (HOSPITAL_COMMUNITY): Payer: Self-pay | Admitting: Cardiovascular Disease

## 2023-09-16 ENCOUNTER — Encounter (HOSPITAL_COMMUNITY)
Admission: RE | Disposition: A | Discharge status: Home / Self Care | Payer: Self-pay | Source: Home / Self Care | Attending: Cardiovascular Disease

## 2023-09-16 ENCOUNTER — Ambulatory Visit (HOSPITAL_BASED_OUTPATIENT_CLINIC_OR_DEPARTMENT_OTHER): Payer: Medicare Other | Admitting: Anesthesiology

## 2023-09-16 ENCOUNTER — Other Ambulatory Visit: Payer: Self-pay

## 2023-09-16 ENCOUNTER — Ambulatory Visit (HOSPITAL_COMMUNITY)
Admission: RE | Admit: 2023-09-16 | Discharge: 2023-09-16 | Disposition: A | Discharge status: Home / Self Care | Payer: Medicare Other | Source: Ambulatory Visit | Attending: Cardiovascular Disease | Admitting: Cardiovascular Disease

## 2023-09-16 ENCOUNTER — Ambulatory Visit (HOSPITAL_COMMUNITY)
Admission: RE | Admit: 2023-09-16 | Discharge: 2023-09-16 | Disposition: A | Discharge status: Home / Self Care | Payer: Medicare Other | Attending: Cardiovascular Disease | Admitting: Cardiovascular Disease

## 2023-09-16 ENCOUNTER — Ambulatory Visit (HOSPITAL_COMMUNITY): Payer: Self-pay | Admitting: Anesthesiology

## 2023-09-16 DIAGNOSIS — I340 Nonrheumatic mitral (valve) insufficiency: Secondary | ICD-10-CM

## 2023-09-16 DIAGNOSIS — I480 Paroxysmal atrial fibrillation: Secondary | ICD-10-CM

## 2023-09-16 DIAGNOSIS — D6869 Other thrombophilia: Secondary | ICD-10-CM | POA: Insufficient documentation

## 2023-09-16 DIAGNOSIS — Z7901 Long term (current) use of anticoagulants: Secondary | ICD-10-CM | POA: Diagnosis not present

## 2023-09-16 DIAGNOSIS — I4819 Other persistent atrial fibrillation: Secondary | ICD-10-CM | POA: Diagnosis not present

## 2023-09-16 DIAGNOSIS — I081 Rheumatic disorders of both mitral and tricuspid valves: Secondary | ICD-10-CM | POA: Insufficient documentation

## 2023-09-16 DIAGNOSIS — G4730 Sleep apnea, unspecified: Secondary | ICD-10-CM | POA: Diagnosis not present

## 2023-09-16 DIAGNOSIS — I472 Ventricular tachycardia, unspecified: Secondary | ICD-10-CM | POA: Diagnosis not present

## 2023-09-16 DIAGNOSIS — I361 Nonrheumatic tricuspid (valve) insufficiency: Secondary | ICD-10-CM | POA: Diagnosis not present

## 2023-09-16 DIAGNOSIS — I4892 Unspecified atrial flutter: Secondary | ICD-10-CM | POA: Diagnosis not present

## 2023-09-16 DIAGNOSIS — K219 Gastro-esophageal reflux disease without esophagitis: Secondary | ICD-10-CM | POA: Insufficient documentation

## 2023-09-16 DIAGNOSIS — Q2112 Patent foramen ovale: Secondary | ICD-10-CM | POA: Diagnosis not present

## 2023-09-16 DIAGNOSIS — I4891 Unspecified atrial fibrillation: Secondary | ICD-10-CM | POA: Diagnosis not present

## 2023-09-16 DIAGNOSIS — Z79899 Other long term (current) drug therapy: Secondary | ICD-10-CM | POA: Diagnosis not present

## 2023-09-16 DIAGNOSIS — Z01818 Encounter for other preprocedural examination: Secondary | ICD-10-CM

## 2023-09-16 DIAGNOSIS — Z95810 Presence of automatic (implantable) cardiac defibrillator: Secondary | ICD-10-CM | POA: Diagnosis not present

## 2023-09-16 DIAGNOSIS — I422 Other hypertrophic cardiomyopathy: Secondary | ICD-10-CM | POA: Insufficient documentation

## 2023-09-16 HISTORY — PX: CARDIOVERSION: EP1203

## 2023-09-16 HISTORY — PX: TRANSESOPHAGEAL ECHOCARDIOGRAM (CATH LAB): EP1270

## 2023-09-16 LAB — ECHO TEE: Est EF: 55

## 2023-09-16 SURGERY — TRANSESOPHAGEAL ECHOCARDIOGRAM (TEE) (CATHLAB)
Anesthesia: Monitor Anesthesia Care

## 2023-09-16 MED ORDER — LIDOCAINE HCL (CARDIAC) PF 100 MG/5ML IV SOSY
PREFILLED_SYRINGE | INTRAVENOUS | Status: DC | PRN
  Administered 2023-09-16: 100 mg via INTRATRACHEAL

## 2023-09-16 MED ORDER — SODIUM CHLORIDE 0.9 % IV SOLN: INTRAVENOUS | Status: DC

## 2023-09-16 MED ORDER — EPHEDRINE SULFATE-NACL 50-0.9 MG/10ML-% IV SOSY
PREFILLED_SYRINGE | INTRAVENOUS | Status: DC | PRN
  Administered 2023-09-16: 10 mg via INTRAVENOUS

## 2023-09-16 MED ORDER — PROPOFOL 10 MG/ML IV BOLUS
INTRAVENOUS | Status: DC | PRN
  Administered 2023-09-16: 40 mg via INTRAVENOUS
  Administered 2023-09-16: 50 mg via INTRAVENOUS
  Administered 2023-09-16: 80 mg via INTRAVENOUS
  Administered 2023-09-16: 20 mg via INTRAVENOUS

## 2023-09-16 SURGICAL SUPPLY — 1 items: PAD DEFIB RADIO PHYSIO CONN (PAD) ×1 IMPLANT

## 2023-09-16 NOTE — Progress Notes (Signed)
  Echocardiogram Echocardiogram Transesophageal has been performed.  Joseph Morrow 09/16/2023, 8:45 AM

## 2023-09-16 NOTE — CV Procedure (Signed)
 Severe bi atrial enlargement No LAA thrombus Mild MR EF 55% Mild RVE and hyupokinesis Mild TR Device leads in RA/RV Trivial PFO AV sclerosis  DCC x 1 200 J biphasic  St Jude available for atrial EMG;s Patient in fib/flutter Converted to NSR with some AV pacing  No immediate neurologic sequelae Has been on eliquis  for over 2 weeks with no missed doses  Maude Emmer MD [facility]

## 2023-09-16 NOTE — Interval H&P Note (Signed)
 History and Physical Interval Note:  09/16/2023 7:16 AM  Joseph Morrow  has presented today for surgery, with the diagnosis of AFIB.  The various methods of treatment have been discussed with the patient and family. After consideration of risks, benefits and other options for treatment, the patient has consented to  Procedure(s): TRANSESOPHAGEAL ECHOCARDIOGRAM (N/A) CARDIOVERSION (N/A) as a surgical intervention.  The patient's history has been reviewed, patient examined, no change in status, stable for surgery.  I have reviewed the patient's chart and labs.  Questions were answered to the patient's satisfaction.     Maude Emmer

## 2023-09-16 NOTE — Transfer of Care (Signed)
 Immediate Anesthesia Transfer of Care Note  Patient: Joseph Morrow [facility]  Procedure(s) Performed: TRANSESOPHAGEAL ECHOCARDIOGRAM CARDIOVERSION  Patient Location: PACU  Anesthesia Type:MAC  Level of Consciousness: awake, alert , and oriented  Airway & Oxygen Therapy: Patient Spontanous Breathing and Patient connected to nasal cannula oxygen  Post-op Assessment: Report given to RN and Post -op Vital signs reviewed and stable  Post vital signs: Reviewed and stable  Last Vitals:  Vitals Value Taken Time  BP    Temp    Pulse    Resp    SpO2      Last Pain:  Vitals:   09/16/23 0730  TempSrc: Temporal  PainSc:          Complications: No notable events documented.

## 2023-09-16 NOTE — Anesthesia Postprocedure Evaluation (Signed)
 Anesthesia Post Note  Patient: Joseph Morrow [facility]  Procedure(s) Performed: TRANSESOPHAGEAL ECHOCARDIOGRAM CARDIOVERSION     Patient location during evaluation: Cath Lab Anesthesia Type: MAC Level of consciousness: awake and alert Pain management: pain level controlled Vital Signs Assessment: post-procedure vital signs reviewed and stable Respiratory status: spontaneous breathing Cardiovascular status: stable Anesthetic complications: no   No notable events documented.  Last Vitals:  Vitals:   09/16/23 0838 09/16/23 0900  BP: 106/75 109/76  Pulse: 68 (!) 55  Resp: 17 12  Temp:    SpO2: 98% 100%    Last Pain:  Vitals:   09/16/23 0829  TempSrc: Temporal  PainSc:                  Norleen Pope

## 2023-09-17 ENCOUNTER — Encounter (HOSPITAL_COMMUNITY): Payer: Self-pay | Admitting: Cardiovascular Disease

## 2023-09-17 DIAGNOSIS — Z0000 Encounter for general adult medical examination without abnormal findings: Secondary | ICD-10-CM | POA: Diagnosis not present

## 2023-09-17 DIAGNOSIS — D6869 Other thrombophilia: Secondary | ICD-10-CM | POA: Diagnosis not present

## 2023-09-17 DIAGNOSIS — Z1331 Encounter for screening for depression: Secondary | ICD-10-CM | POA: Diagnosis not present

## 2023-09-17 DIAGNOSIS — R7301 Impaired fasting glucose: Secondary | ICD-10-CM | POA: Diagnosis not present

## 2023-09-17 DIAGNOSIS — I480 Paroxysmal atrial fibrillation: Secondary | ICD-10-CM | POA: Diagnosis not present

## 2023-09-17 DIAGNOSIS — R7401 Elevation of levels of liver transaminase levels: Secondary | ICD-10-CM | POA: Diagnosis not present

## 2023-09-17 DIAGNOSIS — Z23 Encounter for immunization: Secondary | ICD-10-CM | POA: Diagnosis not present

## 2023-09-17 DIAGNOSIS — M8580 Other specified disorders of bone density and structure, unspecified site: Secondary | ICD-10-CM | POA: Diagnosis not present

## 2023-09-17 DIAGNOSIS — R82998 Other abnormal findings in urine: Secondary | ICD-10-CM | POA: Diagnosis not present

## 2023-09-17 DIAGNOSIS — R0600 Dyspnea, unspecified: Secondary | ICD-10-CM | POA: Diagnosis not present

## 2023-09-17 DIAGNOSIS — M545 Low back pain, unspecified: Secondary | ICD-10-CM | POA: Diagnosis not present

## 2023-09-17 DIAGNOSIS — I421 Obstructive hypertrophic cardiomyopathy: Secondary | ICD-10-CM | POA: Diagnosis not present

## 2023-09-17 DIAGNOSIS — N1831 Chronic kidney disease, stage 3a: Secondary | ICD-10-CM | POA: Diagnosis not present

## 2023-09-17 DIAGNOSIS — I700 Atherosclerosis of aorta: Secondary | ICD-10-CM | POA: Diagnosis not present

## 2023-09-17 DIAGNOSIS — Z1339 Encounter for screening examination for other mental health and behavioral disorders: Secondary | ICD-10-CM | POA: Diagnosis not present

## 2023-09-18 ENCOUNTER — Telehealth: Payer: Self-pay | Admitting: Student

## 2023-09-18 ENCOUNTER — Encounter: Payer: Self-pay | Admitting: Cardiology

## 2023-09-18 NOTE — Telephone Encounter (Signed)
   The patient called the after-hours line reporting that he has been having difficulty sleeping, anxiety and a headache since dose adjustment of Amiodarone . He had been on 200 mg daily and this was increased to 200 mg twice daily on 09/09/2023. He strongly feels that his symptoms are due to the higher dose of Amiodarone  as symptoms typically occur in the nighttime hours after he has taken the second dose. Says that his heart rate has been well-controlled in the 60's and blood pressure has been well-controlled as well. We reviewed options and he will try reducing his dose to 200 mg in AM/100mg  in PM. He will make us  aware if no improvement. Will send this to Dr. Cindie and Quita Kicks, PA to make them aware and for any further recommendations.   Signed, Laymon CHRISTELLA Qua, PA-C 09/18/2023, 4:54 PM Pager: [phone]

## 2023-09-20 NOTE — Telephone Encounter (Signed)
 See phone note. Patient called after hour services and advised to reduce amiodarone to 200 mg qam and 100 mg qpm.

## 2023-09-27 DIAGNOSIS — M9902 Segmental and somatic dysfunction of thoracic region: Secondary | ICD-10-CM | POA: Diagnosis not present

## 2023-09-27 DIAGNOSIS — M5459 Other low back pain: Secondary | ICD-10-CM | POA: Diagnosis not present

## 2023-09-27 DIAGNOSIS — M9901 Segmental and somatic dysfunction of cervical region: Secondary | ICD-10-CM | POA: Diagnosis not present

## 2023-09-27 DIAGNOSIS — M542 Cervicalgia: Secondary | ICD-10-CM | POA: Diagnosis not present

## 2023-09-27 DIAGNOSIS — R293 Abnormal posture: Secondary | ICD-10-CM | POA: Diagnosis not present

## 2023-09-27 DIAGNOSIS — M9903 Segmental and somatic dysfunction of lumbar region: Secondary | ICD-10-CM | POA: Diagnosis not present

## 2023-09-27 DIAGNOSIS — M5137 Other intervertebral disc degeneration, lumbosacral region with discogenic back pain only: Secondary | ICD-10-CM | POA: Diagnosis not present

## 2023-09-27 DIAGNOSIS — M50222 Other cervical disc displacement at C5-C6 level: Secondary | ICD-10-CM | POA: Diagnosis not present

## 2023-09-27 DIAGNOSIS — M791 Myalgia, unspecified site: Secondary | ICD-10-CM | POA: Diagnosis not present

## 2023-10-05 NOTE — Progress Notes (Signed)
Remote ICD transmission.   

## 2023-10-07 ENCOUNTER — Encounter (HOSPITAL_COMMUNITY): Payer: Self-pay | Admitting: Physician Assistant

## 2023-10-07 ENCOUNTER — Ambulatory Visit (HOSPITAL_COMMUNITY)
Admission: RE | Admit: 2023-10-07 | Discharge: 2023-10-07 | Disposition: A | Discharge status: Home / Self Care | Payer: Medicare Other | Source: Ambulatory Visit | Attending: Physician Assistant | Admitting: Physician Assistant

## 2023-10-07 VITALS — BP 124/82 | HR 57 | Ht 70.0 in | Wt 174.6 lb

## 2023-10-07 DIAGNOSIS — I4819 Other persistent atrial fibrillation: Secondary | ICD-10-CM | POA: Diagnosis not present

## 2023-10-07 DIAGNOSIS — I472 Ventricular tachycardia, unspecified: Secondary | ICD-10-CM | POA: Diagnosis not present

## 2023-10-07 DIAGNOSIS — Z79899 Other long term (current) drug therapy: Secondary | ICD-10-CM | POA: Diagnosis not present

## 2023-10-07 DIAGNOSIS — R9431 Abnormal electrocardiogram [ECG] [EKG]: Secondary | ICD-10-CM | POA: Insufficient documentation

## 2023-10-07 DIAGNOSIS — I4892 Unspecified atrial flutter: Secondary | ICD-10-CM | POA: Diagnosis not present

## 2023-10-07 DIAGNOSIS — Z7901 Long term (current) use of anticoagulants: Secondary | ICD-10-CM | POA: Diagnosis not present

## 2023-10-07 DIAGNOSIS — Z5181 Encounter for therapeutic drug level monitoring: Secondary | ICD-10-CM | POA: Diagnosis not present

## 2023-10-07 DIAGNOSIS — D6869 Other thrombophilia: Secondary | ICD-10-CM | POA: Insufficient documentation

## 2023-10-07 DIAGNOSIS — I422 Other hypertrophic cardiomyopathy: Secondary | ICD-10-CM | POA: Diagnosis not present

## 2023-10-07 DIAGNOSIS — I4891 Unspecified atrial fibrillation: Secondary | ICD-10-CM | POA: Diagnosis present

## 2023-10-07 DIAGNOSIS — Z95810 Presence of automatic (implantable) cardiac defibrillator: Secondary | ICD-10-CM | POA: Diagnosis not present

## 2023-10-07 MED ORDER — AMIODARONE HCL 200 MG PO TABS: 200.0000 mg | ORAL_TABLET | Freq: Every day | ORAL | Status: DC

## 2023-10-07 NOTE — Progress Notes (Signed)
Primary Care Physician: Cleatis Polka., MD Referring Physician:Dr. Lalla Brothers  EP: Dr. Aleda Grana Joseph Morrow is a 78 y.o. male with a h/o hypertrophic cardiomyopathy, s/p ICD for VT, atrial fibrillation, maintaining SR with a very small afib burden on Sotalol. Hospitalized for sotalol administration 02/2017. S/p afib ablation 07/04/20 and 09/18/21. Eventually loaded on amiodarone.   Patient returns for follow up for atrial fibrillation. Patient is s/p TEE/DCCV on 09/16/23. He reports that his SOB with exertion has greatly improved but is not quite back to baseline. He remains in SR. No bleeding issues on anticoagulation.   Today, he denies symptoms of palpitations, chest pain, orthopnea, PND, lower extremity edema, dizziness, presyncope, syncope, snoring, daytime somnolence, bleeding, or neurologic sequela. The patient is tolerating medications without difficulties and is otherwise without complaint today.    Past Medical History:  Diagnosis Date   Acute cholecystitis 06/21/2011   Allergy    SEASONAL   Cataract    Complication of anesthesia    HALLUCINATIONS   ED (erectile dysfunction)    GERD (gastroesophageal reflux disease)    Hearing loss    Helicobacter pylori gastritis 06/2014   Hemorrhoids    Hiatal hernia    Hx of adenomatous colonic polyps 06/2014   Hypertrophic cardiomyopathy (HCC)    Mitral regurgitation    Moderate   Osteopenia    Paroxysmal atrial fibrillation (HCC) 04/2015   documented on ICD remote interrogation, treated with sotalol   Septal defect    LV Septal hyptertrophy 18mm, normal EF, minimal LVOT gradiant.   Tubular adenoma of colon    VT (ventricular tachycardia) (HCC) 08/18/2013   s/p St. Jude ICD   ROS- All systems are reviewed and negative except as per the HPI above  Physical Exam: Vitals:   10/07/23 0934  BP: 124/82  Pulse: (!) 57  Weight: 79.2 kg  Height: 5\' 10"  (1.778 m)     Wt Readings from Last 3 Encounters:   10/07/23 79.2 kg  09/16/23 81.6 kg  08/27/23 81.7 kg    GEN: Well nourished, well developed in no acute distress NECK: No JVD; No carotid bruits CARDIAC: Regular rate and rhythm, no murmurs, rubs, gallops RESPIRATORY:  Clear to auscultation without rales, wheezing or rhonchi  ABDOMEN: Soft, non-tender, non-distended EXTREMITIES:  No edema; No deformity    ECG today demonstrates  SB, 1st degree AV block, ST changes inferolateral (similar to previous) Vent. rate 57 BPM PR interval 312 ms QRS duration 72 ms QT/QTcB 418/406 ms   Echo 08/23/19  1. Hypertrophic cardiomyopathy, sigmoid subtype. Basal septum measures 18 mm in maximal dimension. Flow acceleration noted at the LVOT, no LVOT obstruction noted. Chordal systolic anterior motion of the mitral valve without involvment of the anterior leaflet.   2. Left ventricular ejection fraction, by visual estimation, is 60 to  65%. The left ventricle has normal function. There is severely increased  left ventricular hypertrophy.   3. Elevated left atrial and left ventricular end-diastolic pressures.   4. Left ventricular diastolic parameters are consistent with Grade III  diastolic dysfunction (restrictive).   5. Global right ventricle has normal systolic function.The right  ventricular size is normal. No increase in right ventricular wall  thickness.   6. Left atrial size was moderately dilated.   7. Right atrial size was moderately dilated.   8. The mitral valve is normal in structure. Mild mitral valve  regurgitation. No evidence of mitral stenosis.   9. The tricuspid valve is  normal in structure. Tricuspid valve  regurgitation is mild.  10. The aortic valve is tricuspid. Aortic valve regurgitation is not  visualized. No evidence of aortic valve sclerosis or stenosis.  11. The pulmonic valve was normal in structure. Pulmonic valve  regurgitation is mild.  12. Moderately elevated pulmonary artery systolic pressure.  13. The  inferior vena cava is dilated in size with <50% respiratory  variability, suggesting right atrial pressure of 15 mmHg.  14. The left ventricle has no regional wall motion abnormalities.    CHA2DS2-VASc Score = 3  The patient's score is based upon: CHF History: 0 HTN History: 0 Diabetes History: 0 Stroke History: 0 Vascular Disease History: 1 (aoritc atherosclerosis) Age Score: 2 Gender Score: 0       ASSESSMENT AND PLAN: Persistent Atrial Fibrillation/atrial flutter The patient's CHA2DS2-VASc score is 3, indicating a 3.2% annual risk of stroke.   S/p ablation 06/2020 and 09/2021 S/p TEE/DCCV 09/16/23 Patient appears to be maintaining SR Decrease amiodarone to 200 mg once daily Continue Eliquis 5 mg BID Continue Toprol 50 mg daily Continue diltiazem 30 mg PRN q 4 hours for heart racing  Secondary Hypercoagulable State (ICD10:  D68.69) The patient is at significant risk for stroke/thromboembolism based upon his CHA2DS2-VASc Score of 3.  Continue Apixaban (Eliquis). No bleeding issues.  High Risk Medication Monitoring (ICD 10: Z79.899) Intervals on ECG appropriate for amiodarone. He has a long PR at baseline.  Decrease to amiodarone 200 mg once daily as above   HCM S/p ICD, followed by Dr Lalla Brothers and the device clinic  VT S/p ICD On amiodarone  Followed by Dr Lalla Brothers   Follow up with Dr Lalla Brothers per recall. AF clinic in 6 months.    [name] PA-C [facility] [address] [place], [place] [address] [phone]

## 2023-10-20 ENCOUNTER — Other Ambulatory Visit (HOSPITAL_COMMUNITY): Payer: Self-pay | Admitting: Physician Assistant

## 2023-10-25 DIAGNOSIS — M5459 Other low back pain: Secondary | ICD-10-CM | POA: Diagnosis not present

## 2023-10-25 DIAGNOSIS — M9901 Segmental and somatic dysfunction of cervical region: Secondary | ICD-10-CM | POA: Diagnosis not present

## 2023-10-25 DIAGNOSIS — M791 Myalgia, unspecified site: Secondary | ICD-10-CM | POA: Diagnosis not present

## 2023-10-25 DIAGNOSIS — R293 Abnormal posture: Secondary | ICD-10-CM | POA: Diagnosis not present

## 2023-10-25 DIAGNOSIS — M50222 Other cervical disc displacement at C5-C6 level: Secondary | ICD-10-CM | POA: Diagnosis not present

## 2023-10-25 DIAGNOSIS — M9902 Segmental and somatic dysfunction of thoracic region: Secondary | ICD-10-CM | POA: Diagnosis not present

## 2023-10-25 DIAGNOSIS — M542 Cervicalgia: Secondary | ICD-10-CM | POA: Diagnosis not present

## 2023-10-25 DIAGNOSIS — M5137 Other intervertebral disc degeneration, lumbosacral region with discogenic back pain only: Secondary | ICD-10-CM | POA: Diagnosis not present

## 2023-10-25 DIAGNOSIS — M9903 Segmental and somatic dysfunction of lumbar region: Secondary | ICD-10-CM | POA: Diagnosis not present

## 2023-11-02 DIAGNOSIS — H26491 Other secondary cataract, right eye: Secondary | ICD-10-CM | POA: Diagnosis not present

## 2023-11-02 DIAGNOSIS — H5005 Alternating esotropia: Secondary | ICD-10-CM | POA: Diagnosis not present

## 2023-11-02 DIAGNOSIS — Z961 Presence of intraocular lens: Secondary | ICD-10-CM | POA: Diagnosis not present

## 2023-11-02 DIAGNOSIS — H532 Diplopia: Secondary | ICD-10-CM | POA: Diagnosis not present

## 2023-11-22 DIAGNOSIS — M9902 Segmental and somatic dysfunction of thoracic region: Secondary | ICD-10-CM | POA: Diagnosis not present

## 2023-11-22 DIAGNOSIS — R293 Abnormal posture: Secondary | ICD-10-CM | POA: Diagnosis not present

## 2023-11-22 DIAGNOSIS — M5459 Other low back pain: Secondary | ICD-10-CM | POA: Diagnosis not present

## 2023-11-22 DIAGNOSIS — M5137 Other intervertebral disc degeneration, lumbosacral region with discogenic back pain only: Secondary | ICD-10-CM | POA: Diagnosis not present

## 2023-11-22 DIAGNOSIS — M50222 Other cervical disc displacement at C5-C6 level: Secondary | ICD-10-CM | POA: Diagnosis not present

## 2023-11-22 DIAGNOSIS — M9903 Segmental and somatic dysfunction of lumbar region: Secondary | ICD-10-CM | POA: Diagnosis not present

## 2023-11-22 DIAGNOSIS — M791 Myalgia, unspecified site: Secondary | ICD-10-CM | POA: Diagnosis not present

## 2023-11-22 DIAGNOSIS — M542 Cervicalgia: Secondary | ICD-10-CM | POA: Diagnosis not present

## 2023-11-22 DIAGNOSIS — M9901 Segmental and somatic dysfunction of cervical region: Secondary | ICD-10-CM | POA: Diagnosis not present

## 2023-11-23 DIAGNOSIS — H5005 Alternating esotropia: Secondary | ICD-10-CM | POA: Diagnosis not present

## 2023-11-23 DIAGNOSIS — H532 Diplopia: Secondary | ICD-10-CM | POA: Diagnosis not present

## 2023-11-23 DIAGNOSIS — H26491 Other secondary cataract, right eye: Secondary | ICD-10-CM | POA: Diagnosis not present

## 2023-11-23 DIAGNOSIS — Z961 Presence of intraocular lens: Secondary | ICD-10-CM | POA: Diagnosis not present

## 2023-11-23 DIAGNOSIS — H52223 Regular astigmatism, bilateral: Secondary | ICD-10-CM | POA: Diagnosis not present

## 2023-11-25 ENCOUNTER — Other Ambulatory Visit: Payer: Self-pay | Admitting: Cardiology

## 2023-11-25 DIAGNOSIS — I480 Paroxysmal atrial fibrillation: Secondary | ICD-10-CM

## 2023-11-25 NOTE — Telephone Encounter (Signed)
 Prescription refill request for Eliquis received. Indication: afib  Last office visit: [place], 10/07/2023, Coralee North 06/17/2023 Scr:1.7, 08/27/2023 Age: 78 yo Weight: 79.2 kg   Refill sent

## 2023-12-03 ENCOUNTER — Ambulatory Visit: Payer: Medicare Other

## 2023-12-03 DIAGNOSIS — I422 Other hypertrophic cardiomyopathy: Secondary | ICD-10-CM | POA: Diagnosis not present

## 2023-12-03 LAB — CUP PACEART REMOTE DEVICE CHECK
Battery Remaining Longevity: 26 mo
Battery Remaining Percentage: 28 %
Battery Voltage: 2.89 V
Brady Statistic AP VP Percent: 68 %
Brady Statistic AP VS Percent: 3.8 %
Brady Statistic AS VP Percent: 5.7 %
Brady Statistic AS VS Percent: 21 %
Brady Statistic RA Percent Paced: 70 %
Brady Statistic RV Percent Paced: 74 %
Date Time Interrogation Session: 20250321020017
HighPow Impedance: 44 Ohm
HighPow Impedance: 45 Ohm
Implantable Lead Connection Status: 753985
Implantable Lead Connection Status: 753985
Implantable Lead Implant Date: 20141205
Implantable Lead Implant Date: 20141205
Implantable Lead Location: 753859
Implantable Lead Location: 753860
Implantable Lead Model: 7121
Implantable Pulse Generator Implant Date: 20171221
Lead Channel Impedance Value: 380 Ohm
Lead Channel Impedance Value: 460 Ohm
Lead Channel Pacing Threshold Amplitude: 0.75 V
Lead Channel Pacing Threshold Amplitude: 1 V
Lead Channel Pacing Threshold Pulse Width: 0.5 ms
Lead Channel Pacing Threshold Pulse Width: 0.5 ms
Lead Channel Sensing Intrinsic Amplitude: 12 mV
Lead Channel Sensing Intrinsic Amplitude: 2.4 mV
Lead Channel Setting Pacing Amplitude: 2 V
Lead Channel Setting Pacing Amplitude: 2.5 V
Lead Channel Setting Pacing Pulse Width: 0.5 ms
Lead Channel Setting Sensing Sensitivity: 0.5 mV
Pulse Gen Serial Number: 7387049
Zone Setting Status: 755011

## 2023-12-05 ENCOUNTER — Encounter: Payer: Self-pay | Admitting: Cardiology

## 2023-12-06 ENCOUNTER — Other Ambulatory Visit: Payer: Self-pay | Admitting: Internal Medicine

## 2023-12-16 DIAGNOSIS — N1831 Chronic kidney disease, stage 3a: Secondary | ICD-10-CM | POA: Diagnosis not present

## 2023-12-16 DIAGNOSIS — K219 Gastro-esophageal reflux disease without esophagitis: Secondary | ICD-10-CM | POA: Diagnosis not present

## 2023-12-16 DIAGNOSIS — Z7901 Long term (current) use of anticoagulants: Secondary | ICD-10-CM | POA: Diagnosis not present

## 2023-12-16 DIAGNOSIS — E781 Pure hyperglyceridemia: Secondary | ICD-10-CM | POA: Diagnosis not present

## 2023-12-17 DIAGNOSIS — Z961 Presence of intraocular lens: Secondary | ICD-10-CM | POA: Diagnosis not present

## 2023-12-27 DIAGNOSIS — M5137 Other intervertebral disc degeneration, lumbosacral region with discogenic back pain only: Secondary | ICD-10-CM | POA: Diagnosis not present

## 2023-12-27 DIAGNOSIS — M5459 Other low back pain: Secondary | ICD-10-CM | POA: Diagnosis not present

## 2023-12-27 DIAGNOSIS — M9902 Segmental and somatic dysfunction of thoracic region: Secondary | ICD-10-CM | POA: Diagnosis not present

## 2023-12-27 DIAGNOSIS — M9901 Segmental and somatic dysfunction of cervical region: Secondary | ICD-10-CM | POA: Diagnosis not present

## 2023-12-27 DIAGNOSIS — M542 Cervicalgia: Secondary | ICD-10-CM | POA: Diagnosis not present

## 2023-12-27 DIAGNOSIS — M9903 Segmental and somatic dysfunction of lumbar region: Secondary | ICD-10-CM | POA: Diagnosis not present

## 2023-12-27 DIAGNOSIS — M791 Myalgia, unspecified site: Secondary | ICD-10-CM | POA: Diagnosis not present

## 2023-12-27 DIAGNOSIS — M50222 Other cervical disc displacement at C5-C6 level: Secondary | ICD-10-CM | POA: Diagnosis not present

## 2023-12-27 DIAGNOSIS — R293 Abnormal posture: Secondary | ICD-10-CM | POA: Diagnosis not present

## 2024-01-06 DIAGNOSIS — I480 Paroxysmal atrial fibrillation: Secondary | ICD-10-CM | POA: Diagnosis not present

## 2024-01-06 DIAGNOSIS — R2681 Unsteadiness on feet: Secondary | ICD-10-CM | POA: Diagnosis not present

## 2024-01-06 DIAGNOSIS — D6869 Other thrombophilia: Secondary | ICD-10-CM | POA: Diagnosis not present

## 2024-01-06 DIAGNOSIS — S40021A Contusion of right upper arm, initial encounter: Secondary | ICD-10-CM | POA: Diagnosis not present

## 2024-01-11 DIAGNOSIS — R2681 Unsteadiness on feet: Secondary | ICD-10-CM | POA: Diagnosis not present

## 2024-01-11 DIAGNOSIS — D6869 Other thrombophilia: Secondary | ICD-10-CM | POA: Diagnosis not present

## 2024-01-11 DIAGNOSIS — M79601 Pain in right arm: Secondary | ICD-10-CM | POA: Diagnosis not present

## 2024-01-11 DIAGNOSIS — I480 Paroxysmal atrial fibrillation: Secondary | ICD-10-CM | POA: Diagnosis not present

## 2024-01-11 DIAGNOSIS — S40021A Contusion of right upper arm, initial encounter: Secondary | ICD-10-CM | POA: Diagnosis not present

## 2024-01-11 NOTE — Progress Notes (Signed)
 Remote ICD transmission.

## 2024-01-21 DIAGNOSIS — K4020 Bilateral inguinal hernia, without obstruction or gangrene, not specified as recurrent: Secondary | ICD-10-CM | POA: Diagnosis not present

## 2024-01-21 DIAGNOSIS — S5011XA Contusion of right forearm, initial encounter: Secondary | ICD-10-CM | POA: Diagnosis not present

## 2024-01-24 DIAGNOSIS — M9903 Segmental and somatic dysfunction of lumbar region: Secondary | ICD-10-CM | POA: Diagnosis not present

## 2024-01-24 DIAGNOSIS — M5137 Other intervertebral disc degeneration, lumbosacral region with discogenic back pain only: Secondary | ICD-10-CM | POA: Diagnosis not present

## 2024-01-24 DIAGNOSIS — M50222 Other cervical disc displacement at C5-C6 level: Secondary | ICD-10-CM | POA: Diagnosis not present

## 2024-01-24 DIAGNOSIS — M791 Myalgia, unspecified site: Secondary | ICD-10-CM | POA: Diagnosis not present

## 2024-01-24 DIAGNOSIS — M9902 Segmental and somatic dysfunction of thoracic region: Secondary | ICD-10-CM | POA: Diagnosis not present

## 2024-01-24 DIAGNOSIS — M542 Cervicalgia: Secondary | ICD-10-CM | POA: Diagnosis not present

## 2024-01-24 DIAGNOSIS — M5459 Other low back pain: Secondary | ICD-10-CM | POA: Diagnosis not present

## 2024-01-24 DIAGNOSIS — R293 Abnormal posture: Secondary | ICD-10-CM | POA: Diagnosis not present

## 2024-01-24 DIAGNOSIS — M9901 Segmental and somatic dysfunction of cervical region: Secondary | ICD-10-CM | POA: Diagnosis not present

## 2024-02-11 ENCOUNTER — Telehealth: Payer: Self-pay

## 2024-02-11 ENCOUNTER — Ambulatory Visit: Payer: Self-pay | Admitting: General Surgery

## 2024-02-11 DIAGNOSIS — K4020 Bilateral inguinal hernia, without obstruction or gangrene, not specified as recurrent: Secondary | ICD-10-CM | POA: Diagnosis not present

## 2024-02-11 NOTE — Telephone Encounter (Signed)
..     Pre-operative Risk Assessment    Patient Name: Joseph Morrow  DOB: 09-09-1946 MRN: [id number]   Date of last office visit: 10/07/23 Lujean Sake) 06/17/23 Percell Boyers) Date of next office visit: NONE   Request for Surgical Clearance    Procedure:  INGUINAL HERNIA  Date of Surgery:  Clearance TBD                                Surgeon:  DR Shela Derby Surgeon's Group or Practice Name:  [facility] Phone number:  [phone] Fax number:  [phone]   Type of Clearance Requested:   - Medical  - Pharmacy:  Hold Apixaban  (Eliquis )     Type of Anesthesia:  General    Additional requests/questions:    Montel Antu   02/11/2024, 1:30 PM

## 2024-02-11 NOTE — Telephone Encounter (Signed)
 Pharmacy please advise on holding Eliquis  prior to inguinal hernia repair scheduled for TBD. Thank you.

## 2024-02-16 ENCOUNTER — Telehealth: Payer: Self-pay

## 2024-02-16 NOTE — Telephone Encounter (Signed)
 Patient agreeable with virtual telehealth appointment. Medication list and consent have both been reviewed. Patient voiced understanding.

## 2024-02-16 NOTE — Telephone Encounter (Signed)
  Patient Consent for Virtual Visit         [name] has provided verbal consent on 02/16/2024 for a virtual visit (video or telephone).   CONSENT FOR VIRTUAL VISIT FOR:  Joseph Morrow  By participating in this virtual visit I agree to the following:  I hereby voluntarily request, consent and authorize [facility] and its employed or contracted physicians, physician assistants, nurse practitioners or other licensed health care professionals (the Practitioner), to provide me with telemedicine health care services (the "Services") as deemed necessary by the treating Practitioner. I acknowledge and consent to receive the Services by the Practitioner via telemedicine. I understand that the telemedicine visit will involve communicating with the Practitioner through live audiovisual communication technology and the disclosure of certain medical information by electronic transmission. I acknowledge that I have been given the opportunity to request an in-person assessment or other available alternative prior to the telemedicine visit and am voluntarily participating in the telemedicine visit.  I understand that I have the right to withhold or withdraw my consent to the use of telemedicine in the course of my care at any time, without affecting my right to future care or treatment, and that the Practitioner or I may terminate the telemedicine visit at any time. I understand that I have the right to inspect all information obtained and/or recorded in the course of the telemedicine visit and may receive copies of available information for a reasonable fee.  I understand that some of the potential risks of receiving the Services via telemedicine include:  Delay or interruption in medical evaluation due to technological equipment failure or disruption; Information transmitted may not be sufficient (e.g. poor resolution of images) to allow for appropriate medical decision  making by the Practitioner; and/or  In rare instances, security protocols could fail, causing a breach of personal health information.  Furthermore, I acknowledge that it is my responsibility to provide information about my medical history, conditions and care that is complete and accurate to the best of my ability. I acknowledge that Practitioner's advice, recommendations, and/or decision may be based on factors not within their control, such as incomplete or inaccurate data provided by me or distortions of diagnostic images or specimens that may result from electronic transmissions. I understand that the practice of medicine is not an exact science and that Practitioner makes no warranties or guarantees regarding treatment outcomes. I acknowledge that a copy of this consent can be made available to me via my patient portal [facility] MyChart), or I can request a printed copy by calling the office of [facility].    I understand that my insurance will be billed for this visit.   I have read or had this consent read to me. I understand the contents of this consent, which adequately explains the benefits and risks of the Services being provided via telemedicine.  I have been provided ample opportunity to ask questions regarding this consent and the Services and have had my questions answered to my satisfaction. I give my informed consent for the services to be provided through the use of telemedicine in my medical care

## 2024-02-16 NOTE — Telephone Encounter (Signed)
   Name: Joseph Morrow [facility]  DOB: April 29, 1946  MRN: [id number]  Primary Cardiologist: None   Preoperative team, please contact this patient and set up a phone call appointment for further preoperative risk assessment. Please obtain consent and complete medication review. Thank you for your help.  I confirm that guidance regarding antiplatelet and oral anticoagulation therapy has been completed and, if necessary, noted below.  Per office protocol, patient can hold Eliquis  for 2 days prior to procedure.    I also confirmed the patient resides in the state of [place] . As per [facility] Medical Board telemedicine laws, the patient must reside in the state in which the provider is licensed.   Francene Ing, Retha Cast, NP 02/16/2024, 7:59 AM [facility]

## 2024-02-16 NOTE — Telephone Encounter (Signed)
 Patient with diagnosis of afib on Eliquis  for anticoagulation.    Procedure:  INGUINAL HERNIA  Date of procedure: TBD   CHA2DS2-VASc Score = 3   This indicates a 3.2% annual risk of stroke. The patient's score is based upon: CHF History: 0 HTN History: 0 Diabetes History: 0 Stroke History: 0 Vascular Disease History: 1 (aoritc atherosclerosis) Age Score: 2 Gender Score: 0      CrCl 40 ml/min Platelet count 180  Patient has not had an Afib/aflutter ablation within the last 3 months or DCCV within the last 30 days  Per office protocol, patient can hold Eliquis  for 2 days prior to procedure.    **This guidance is not considered finalized until pre-operative APP has relayed final recommendations.**

## 2024-02-23 ENCOUNTER — Telehealth (HOSPITAL_COMMUNITY): Payer: Self-pay | Admitting: *Deleted

## 2024-02-23 NOTE — Telephone Encounter (Signed)
 Pt called in stating he submitted a transmission this afternoon due to feeling more short of breath the last week or so.  Device clinic reviewed transmissions --   He is in NSR and Corevue looks good. I am not seeing anything to correlate with his symptoms -- looks like a few days ago he was holding fluid but is trending back to normal now His graph shows increase AT/AF burden but its RA oversensing  Discussed above with Myrtha Ates PA no changes to medications recommended at this time. Pt states he actually started taking lasix  a few days ago due to weight gain from diet high in sodium lately.  Re-educated pt on importance of low-sodium diet. Pt will call if issues.

## 2024-02-24 DIAGNOSIS — M9902 Segmental and somatic dysfunction of thoracic region: Secondary | ICD-10-CM | POA: Diagnosis not present

## 2024-02-24 DIAGNOSIS — M5459 Other low back pain: Secondary | ICD-10-CM | POA: Diagnosis not present

## 2024-02-24 DIAGNOSIS — M9903 Segmental and somatic dysfunction of lumbar region: Secondary | ICD-10-CM | POA: Diagnosis not present

## 2024-02-24 DIAGNOSIS — R293 Abnormal posture: Secondary | ICD-10-CM | POA: Diagnosis not present

## 2024-02-24 DIAGNOSIS — M791 Myalgia, unspecified site: Secondary | ICD-10-CM | POA: Diagnosis not present

## 2024-02-24 DIAGNOSIS — M5137 Other intervertebral disc degeneration, lumbosacral region with discogenic back pain only: Secondary | ICD-10-CM | POA: Diagnosis not present

## 2024-02-24 DIAGNOSIS — M9901 Segmental and somatic dysfunction of cervical region: Secondary | ICD-10-CM | POA: Diagnosis not present

## 2024-02-24 DIAGNOSIS — M542 Cervicalgia: Secondary | ICD-10-CM | POA: Diagnosis not present

## 2024-02-24 DIAGNOSIS — M50222 Other cervical disc displacement at C5-C6 level: Secondary | ICD-10-CM | POA: Diagnosis not present

## 2024-02-29 ENCOUNTER — Ambulatory Visit: Attending: Cardiology | Admitting: Student

## 2024-02-29 DIAGNOSIS — Z01810 Encounter for preprocedural cardiovascular examination: Secondary | ICD-10-CM

## 2024-02-29 NOTE — Progress Notes (Signed)
 Virtual Visit via Telephone Note   Because of Joseph Morrow's co-morbid illnesses, he is at least at moderate risk for complications without adequate follow up.  This format is felt to be most appropriate for this patient at this time.  The patient did not have access to video technology/had technical difficulties with video requiring transitioning to audio format only (telephone).  All issues noted in this document were discussed and addressed.  No physical exam could be performed with this format.  Please refer to the patient's chart for his consent to telehealth for [facility].  Evaluation Performed:  Preoperative cardiovascular risk assessment _____________   Date:  02/29/2024   Patient ID:  Joseph Morrow, DOB 1946/05/19, MRN [id number] Patient Location:  Home Provider location:   Office  Primary Care Provider:  Jeannine Milroy., MD [facility] Providers Electrophysiologist:  Boyce Byes, MD   Chief Complaint / Patient Profile   78 y.o. y/o male with a h/o hypertrophic cardiomyopathy, V. tach s/p ICD, PAF/typical a-flutter s/p A-flutter ablation October 2021 and A-fib ablation January 2023; DCCV January 2025 on anticoagulation who is pending inguinal hernia repair by Dr. Melton Squires and presents today for telephonic preoperative cardiovascular risk assessment.  History of Present Illness    Joseph Morrow is a 78 y.o. male who presents via audio/video conferencing for a telehealth visit today.  Pt was last seen in cardiology clinic on 06/17/2023 by Michaelle Adolphus, PA-C.  At that time Joseph Morrow was stable from a cardiac standpoint.  Patient was also seen by A-fib clinic in January 2025 post DCCV and was maintaining sinus rhythm.  The patient is now pending procedure as outlined above. Since his last visit, he is doing well. Patient denies shortness of breath, dyspnea on exertion, orthopnea or PND. He recently  noted slight lower extremity edema for which he is managing with Lasix . No chest pain, pressure, or tightness. No palpitations. He is very active walking 5000-6000 daily.   Past Medical History    Past Medical History:  Diagnosis Date   Acute cholecystitis 06/21/2011   Allergy    SEASONAL   Cataract    Complication of anesthesia    HALLUCINATIONS   ED (erectile dysfunction)    GERD (gastroesophageal reflux disease)    Hearing loss    Helicobacter pylori gastritis 06/2014   Hemorrhoids    Hiatal hernia    Hx of adenomatous colonic polyps 06/2014   Hypertrophic cardiomyopathy (HCC)    Mitral regurgitation    Moderate   Osteopenia    Paroxysmal atrial fibrillation (HCC) 04/2015   documented on ICD remote interrogation, treated with sotalol    Septal defect    LV Septal hyptertrophy 18mm, normal EF, minimal LVOT gradiant.   Tubular adenoma of colon    VT (ventricular tachycardia) (HCC) 08/18/2013   s/p St. Jude ICD   Past Surgical History:  Procedure Laterality Date   APPENDECTOMY  1950   ATRIAL FIBRILLATION ABLATION N/A 07/04/2020   Procedure: ATRIAL FIBRILLATION ABLATION;  Surgeon: Jolly Needle, MD;  Location: [facility] INVASIVE CV LAB;  Service: Cardiovascular;  Laterality: N/A;   ATRIAL FIBRILLATION ABLATION N/A 09/18/2021   Procedure: ATRIAL FIBRILLATION ABLATION;  Surgeon: Boyce Byes, MD;  Location: [facility] INVASIVE CV LAB;  Service: Cardiovascular;  Laterality: N/A;   CARDIOVERSION N/A 02/25/2017   Procedure: CARDIOVERSION;  Surgeon: Elmyra Haggard, MD;  Location: [facility] ENDOSCOPY;  Service: Cardiovascular;  Laterality: N/A;   CARDIOVERSION N/A 07/30/2021  Procedure: CARDIOVERSION;  Surgeon: Bridgette Campus, MD;  Location: [facility] ENDOSCOPY;  Service: Cardiovascular;  Laterality: N/A;   CARDIOVERSION N/A 04/27/2022   Procedure: CARDIOVERSION;  Surgeon: Hugh Madura, MD;  Location: [facility] ENDOSCOPY;  Service: Cardiovascular;  Laterality: N/A;   CARDIOVERSION N/A 09/15/2022   Procedure:  CARDIOVERSION;  Surgeon: Jann Melody, MD;  Location: [facility] ENDOSCOPY;  Service: Cardiovascular;  Laterality: N/A;   CARDIOVERSION N/A 09/16/2023   Procedure: CARDIOVERSION;  Surgeon: Loyde Rule, MD;  Location: [facility] INVASIVE CV LAB;  Service: Cardiovascular;  Laterality: N/A;   CATARACT EXTRACTION, BILATERAL     CHOLECYSTECTOMY  06/24/2011   COLONOSCOPY     EP IMPLANTABLE DEVICE N/A 09/03/2016   SJM Fortify Assura Dr generator change by Dr Nunzio Belch.  ICD implanted for secondary prevention of sudden death   IMPLANTABLE CARDIOVERTER DEFIBRILLATOR IMPLANT  08/18/2013   St. Jude Fortify Assura DR ICD implanted by Dr Nunzio Belch for secondary treatment of VT   IMPLANTABLE CARDIOVERTER DEFIBRILLATOR IMPLANT N/A 08/18/2013   Procedure: IMPLANTABLE CARDIOVERTER DEFIBRILLATOR IMPLANT;  Surgeon: Ellaree Gunther, MD;  Location: [facility] CATH LAB;  Service: Cardiovascular;  Laterality: N/A;   TEE WITHOUT CARDIOVERSION N/A 07/30/2021   Procedure: TRANSESOPHAGEAL ECHOCARDIOGRAM (TEE);  Surgeon: Bridgette Campus, MD;  Location: [facility] ENDOSCOPY;  Service: Cardiovascular;  Laterality: N/A;   TRANSESOPHAGEAL ECHOCARDIOGRAM (CATH LAB) N/A 09/16/2023   Procedure: TRANSESOPHAGEAL ECHOCARDIOGRAM;  Surgeon: Loyde Rule, MD;  Location: [facility] INVASIVE CV LAB;  Service: Cardiovascular;  Laterality: N/A;    Allergies  Allergies  Allergen Reactions   Prednisone Anxiety    Extreme anxiety, hallucinations   Dextromethorphan Other (See Comments)    nervous   Meclizine     Caused increased heart rate and constipation   Versed  [Midazolam ] Other (See Comments)    hallucinations, headache   Zofran  [Ondansetron ]     Caused increased heart rate and constipation   Antihistamines, Diphenhydramine -Type Anxiety    Home Medications    Prior to Admission medications   Medication Sig Start Date End Date Taking? Authorizing Provider  acetaminophen  (TYLENOL ) 650 MG CR tablet Take 650-1,300 mg by mouth every 8 (eight) hours as  needed for pain.    [provider]  amiodarone  (PACERONE ) 200 MG tablet TAKE 1 TABLET BY MOUTH 2 TIMES A DAY 12/08/23   Boyce Byes, MD  Calcium Carbonate (CALCIUM 500 PO) Take 500 mg by mouth 2 (two) times daily.    [provider]  Cholecalciferol (VITAMIN D) 50 MCG (2000 UT) tablet Take 2,000 Units by mouth 2 (two) times daily.    [provider]  Coenzyme Q10 (COQ10) 200 MG CAPS Take 200 mg by mouth daily.    [provider]  diltiazem  (CARDIZEM ) 30 MG tablet Cardizem  30mg  -- take 1 tablet every 4 hours AS NEEDED for heart rate >100 08/27/23   Fenton, Clint R, PA  ELIQUIS  5 MG TABS tablet TAKE 1 TABLET BY MOUTH 2 TIMES A DAY 11/25/23   Boyce Byes, MD  furosemide  (LASIX ) 20 MG tablet Take 1 tablet (20 mg total) by mouth daily as needed (swelling/wt gain). 09/06/23   Fenton, Clint R, PA  GLUCOSAMINE-CHONDROITIN PO Take 1 tablet by mouth daily.    [provider]  metoprolol  succinate (TOPROL -XL) 50 MG 24 hr tablet TAKE 1 TABLET BY MOUTH DAILY. TAKE WITH OR IMMEDIATELY FOLLOWING A MEAL. 10/20/23   Fenton, Clint R, PA  potassium citrate  (UROCIT-K ) 10 MEQ (1080 MG) SR tablet Take 1 tablet (10  mEq total) by mouth daily as needed (with lasix  (swelling/weight gain)). 09/10/23   Fenton, Clint R, PA  Probiotic Product (ALIGN PO) Take 1 capsule by mouth in the morning.    [provider]  Simethicone (GAS-X MAXIMUM STRENGTH) 250 MG CAPS Take 250 mg by mouth 2 (two) times daily.    [provider]  Zinc  50 MG CAPS Take 50 mg by mouth 2 (two) times daily.     [provider]    Physical Exam    Vital Signs:  Rudolph Daoust does not have vital signs available for review today.  Given telephonic nature of communication, physical exam is limited. AAOx3. NAD. Normal affect.  Speech and respirations are unlabored.  Assessment & Plan    [facility] Providers Electrophysiologist:  Boyce Byes, MD    Preoperative cardiovascular risk assessment.  Inguinal hernia repair by Dr. Melton Squires.  Chart reviewed as part of pre-operative protocol coverage. According to the RCRI, patient has a 0.9-6% risk of MACE. Patient reports activity equivalent to >4.0 METS (walks 5000-6000 steps per day).   Given past medical history and time since last visit, based on ACC/AHA guidelines, Rieley Elliott Tung would be at acceptable risk for the planned procedure without further cardiovascular testing.   Patient was advised that if he develops new symptoms prior to surgery to contact our office to arrange a follow-up appointment.  he verbalized understanding.  Per Pharm D, patient has not had an Afib/aflutter ablation within the last 3 months or DCCV within the last 30 days.  Patient may hold Eliquis  for 2 days prior to procedure.    I will route this recommendation to the requesting party via Epic fax function.  Please call with questions.  Time:   Today, I have spent 5 minutes with the patient with telehealth technology discussing medical history, symptoms, and management plan.     Morey Ar, NP  02/29/2024, 7:16 AM

## 2024-03-03 ENCOUNTER — Ambulatory Visit (INDEPENDENT_AMBULATORY_CARE_PROVIDER_SITE_OTHER): Payer: Medicare Other

## 2024-03-03 DIAGNOSIS — I422 Other hypertrophic cardiomyopathy: Secondary | ICD-10-CM

## 2024-03-03 LAB — CUP PACEART REMOTE DEVICE CHECK
Battery Remaining Longevity: 25 mo
Battery Remaining Percentage: 27 %
Battery Voltage: 2.87 V
Brady Statistic AP VP Percent: 79 %
Brady Statistic AP VS Percent: 2.4 %
Brady Statistic AS VP Percent: 4 %
Brady Statistic AS VS Percent: 14 %
Brady Statistic RA Percent Paced: 80 %
Brady Statistic RV Percent Paced: 83 %
Date Time Interrogation Session: 20250620030907
HighPow Impedance: 46 Ohm
HighPow Impedance: 46 Ohm
Implantable Lead Connection Status: 753985
Implantable Lead Connection Status: 753985
Implantable Lead Implant Date: 20141205
Implantable Lead Implant Date: 20141205
Implantable Lead Location: 753859
Implantable Lead Location: 753860
Implantable Lead Model: 7121
Implantable Pulse Generator Implant Date: 20171221
Lead Channel Impedance Value: 430 Ohm
Lead Channel Impedance Value: 490 Ohm
Lead Channel Pacing Threshold Amplitude: 0.75 V
Lead Channel Pacing Threshold Amplitude: 1 V
Lead Channel Pacing Threshold Pulse Width: 0.5 ms
Lead Channel Pacing Threshold Pulse Width: 0.5 ms
Lead Channel Sensing Intrinsic Amplitude: 12 mV
Lead Channel Sensing Intrinsic Amplitude: 3 mV
Lead Channel Setting Pacing Amplitude: 2 V
Lead Channel Setting Pacing Amplitude: 2.5 V
Lead Channel Setting Pacing Pulse Width: 0.5 ms
Lead Channel Setting Sensing Sensitivity: 0.5 mV
Pulse Gen Serial Number: 7387049
Zone Setting Status: 755011

## 2024-03-06 ENCOUNTER — Ambulatory Visit: Payer: Self-pay | Admitting: Cardiology

## 2024-03-07 ENCOUNTER — Encounter: Payer: Self-pay | Admitting: Cardiology

## 2024-03-27 DIAGNOSIS — Z961 Presence of intraocular lens: Secondary | ICD-10-CM | POA: Diagnosis not present

## 2024-03-27 DIAGNOSIS — H532 Diplopia: Secondary | ICD-10-CM | POA: Diagnosis not present

## 2024-03-31 DIAGNOSIS — M542 Cervicalgia: Secondary | ICD-10-CM | POA: Diagnosis not present

## 2024-03-31 DIAGNOSIS — R293 Abnormal posture: Secondary | ICD-10-CM | POA: Diagnosis not present

## 2024-03-31 DIAGNOSIS — M9903 Segmental and somatic dysfunction of lumbar region: Secondary | ICD-10-CM | POA: Diagnosis not present

## 2024-03-31 DIAGNOSIS — M50222 Other cervical disc displacement at C5-C6 level: Secondary | ICD-10-CM | POA: Diagnosis not present

## 2024-03-31 DIAGNOSIS — M9902 Segmental and somatic dysfunction of thoracic region: Secondary | ICD-10-CM | POA: Diagnosis not present

## 2024-03-31 DIAGNOSIS — M9901 Segmental and somatic dysfunction of cervical region: Secondary | ICD-10-CM | POA: Diagnosis not present

## 2024-03-31 DIAGNOSIS — M5459 Other low back pain: Secondary | ICD-10-CM | POA: Diagnosis not present

## 2024-03-31 DIAGNOSIS — M791 Myalgia, unspecified site: Secondary | ICD-10-CM | POA: Diagnosis not present

## 2024-03-31 DIAGNOSIS — M5137 Other intervertebral disc degeneration, lumbosacral region with discogenic back pain only: Secondary | ICD-10-CM | POA: Diagnosis not present

## 2024-04-03 ENCOUNTER — Telehealth: Payer: Self-pay

## 2024-04-03 NOTE — Telephone Encounter (Signed)
 Alert remote transmission: Exceed AT/AF AF in progress from 7/20, overall controlled rates, Eliquis  per EPIC -  Patient has follow up appt with R. Fenton PA on 04/05/24.  Note increase in AF burden and recent s/s of fluid retention over the past 7 days according to [facility].   Flagging for AF clinic for follow up this week.

## 2024-04-05 ENCOUNTER — Ambulatory Visit (HOSPITAL_COMMUNITY)
Admission: RE | Admit: 2024-04-05 | Discharge: 2024-04-05 | Disposition: A | Discharge status: Home / Self Care | Payer: Medicare Other | Source: Ambulatory Visit | Attending: Physician Assistant | Admitting: Physician Assistant

## 2024-04-05 VITALS — BP 112/74 | HR 60 | Ht 70.0 in | Wt 165.8 lb

## 2024-04-05 DIAGNOSIS — Z79899 Other long term (current) drug therapy: Secondary | ICD-10-CM | POA: Diagnosis not present

## 2024-04-05 DIAGNOSIS — Z5181 Encounter for therapeutic drug level monitoring: Secondary | ICD-10-CM | POA: Diagnosis not present

## 2024-04-05 DIAGNOSIS — D6869 Other thrombophilia: Secondary | ICD-10-CM | POA: Insufficient documentation

## 2024-04-05 DIAGNOSIS — I4819 Other persistent atrial fibrillation: Secondary | ICD-10-CM | POA: Insufficient documentation

## 2024-04-05 DIAGNOSIS — I484 Atypical atrial flutter: Secondary | ICD-10-CM | POA: Diagnosis not present

## 2024-04-05 DIAGNOSIS — I4891 Unspecified atrial fibrillation: Secondary | ICD-10-CM | POA: Insufficient documentation

## 2024-04-05 NOTE — Anesthesia Preprocedure Evaluation (Signed)
 Anesthesia Evaluation  Patient identified by MRN, date of birth, ID band Patient awake    Reviewed: Allergy & Precautions, NPO status , Patient's Chart, lab work & pertinent test results, reviewed documented beta blocker date and time   Airway Mallampati: II  TM Distance: >3 FB Neck ROM: Full    Dental no notable dental hx. (+) Dental Advisory Given, Teeth Intact   Pulmonary former smoker   Pulmonary exam normal breath sounds clear to auscultation       Cardiovascular Normal cardiovascular exam+ dysrhythmias (eliquis ) Atrial Fibrillation and Ventricular Tachycardia + Cardiac Defibrillator + Valvular Problems/Murmurs MR  Rhythm:Irregular Rate:Normal  HOCM  TEE 2025  1. No LAA thrombus St Jude device EMG;s showed atrial flutter/fib DCC x 1  200 J biphasic converted to NSR with no immediate neurologic sequelae. Has  been on eliquis  for over 2 weeks.   2. Left ventricular ejection fraction, by estimation, is 55%. The left  ventricle has normal function.   3. Device wires in RA/RV. Right ventricular systolic function is mildly  reduced. The right ventricular size is mildly enlarged.   4. Left atrial size was severely dilated. No left atrial/left atrial  appendage thrombus was detected.   5. Right atrial size was severely dilated.   6. The mitral valve is degenerative. Mild to moderate mitral valve  regurgitation.   7. Tricuspid valve regurgitation is mild to moderate.   8. The aortic valve is tricuspid. Aortic valve regurgitation is not  visualized. No aortic stenosis is present.   9. Small PFO.   TTE 2022 1. Hypertrophic cardiomyopathy, sigmoid subtype. Basal septum measures 18  mm in maximal dimension. Flow acceleration noted at the LVOT, no LVOT  obstruction noted. Chordal systolic anterior motion of the mitral valve  without involvment of the anterior  leaflet.   2. Left ventricular ejection fraction, by visual estimation,  is 60 to  65%. The left ventricle has normal function. There is severely increased  left ventricular hypertrophy.   3. Elevated left atrial and left ventricular end-diastolic pressures.   4. Left ventricular diastolic parameters are consistent with Grade III  diastolic dysfunction (restrictive).   5. Global right ventricle has normal systolic function.The right  ventricular size is normal. No increase in right ventricular wall  thickness.   6. Left atrial size was moderately dilated.   7. Right atrial size was moderately dilated.   8. The mitral valve is normal in structure. Mild mitral valve  regurgitation. No evidence of mitral stenosis.   9. The tricuspid valve is normal in structure. Tricuspid valve  regurgitation is mild.  10. The aortic valve is tricuspid. Aortic valve regurgitation is not  visualized. No evidence of aortic valve sclerosis or stenosis.  11. The pulmonic valve was normal in structure. Pulmonic valve  regurgitation is mild.  12. Moderately elevated pulmonary artery systolic pressure.  13. The inferior vena cava is dilated in size with <50% respiratory  variability, suggesting right atrial pressure of 15 mmHg.  14. The left ventricle has no regional wall motion abnormalities.      Neuro/Psych negative neurological ROS  negative psych ROS   GI/Hepatic Neg liver ROS, hiatal hernia,GERD  ,,  Endo/Other  negative endocrine ROS    Renal/GU negative Renal ROS  negative genitourinary   Musculoskeletal negative musculoskeletal ROS (+)    Abdominal   Peds  Hematology negative hematology ROS (+)   Anesthesia Other Findings   Reproductive/Obstetrics  Anesthesia Physical Anesthesia Plan  ASA: 3  Anesthesia Plan: General   Post-op Pain Management:    Induction: Intravenous  PONV Risk Score and Plan: Propofol  infusion and Treatment may vary due to age or medical condition  Airway Management  Planned: Natural Airway  Additional Equipment:   Intra-op Plan:   Post-operative Plan:   Informed Consent: I have reviewed the patients History and Physical, chart, labs and discussed the procedure including the risks, benefits and alternatives for the proposed anesthesia with the patient or authorized representative who has indicated his/her understanding and acceptance.     Dental advisory given  Plan Discussed with: CRNA  Anesthesia Plan Comments:          Anesthesia Quick Evaluation

## 2024-04-05 NOTE — Progress Notes (Signed)
 Primary Care Physician: Loreli Elsie JONETTA Mickey., MD Referring Physician:Dr. Cindie  EP: Dr. Cindie Senior Montgomery Favor is a 78 y.o. male with a h/o hypertrophic cardiomyopathy, s/p ICD for VT, atrial fibrillation. Hospitalized for sotalol  administration 02/2017. S/p afib ablation 07/04/20 and 09/18/21. Eventually loaded on amiodarone . Patient is s/p TEE/DCCV on 09/16/23.   Patient returns for follow up for atrial fibrillation and atrial flutter. The device clinic received an alert for an atrial flutter episode starting 04/02/24. There were no specific triggers that the patient could identify. He reports that he is asymptomatic although he admits he has not tried to be very active recently. No bleeding issues on anticoagulation.   Today, he  denies symptoms of palpitations, chest pain, shortness of breath, orthopnea, PND, lower extremity edema, dizziness, presyncope, syncope, snoring, daytime somnolence, bleeding, or neurologic sequela. The patient is tolerating medications without difficulties and is otherwise without complaint today.    Past Medical History:  Diagnosis Date   Acute cholecystitis 06/21/2011   Allergy    SEASONAL   Cataract    Complication of anesthesia    HALLUCINATIONS   ED (erectile dysfunction)    GERD (gastroesophageal reflux disease)    Hearing loss    Helicobacter pylori gastritis 06/2014   Hemorrhoids    Hiatal hernia    Hx of adenomatous colonic polyps 06/2014   Hypertrophic cardiomyopathy (HCC)    Mitral regurgitation    Moderate   Osteopenia    Paroxysmal atrial fibrillation (HCC) 04/2015   documented on ICD remote interrogation, treated with sotalol    Septal defect    LV Septal hyptertrophy 18mm, normal EF, minimal LVOT gradiant.   Tubular adenoma of colon    VT (ventricular tachycardia) (HCC) 08/18/2013   s/p St. Jude ICD   ROS- All systems are reviewed and negative except as per the HPI above  Physical Exam: Vitals:   04/05/24 0924  BP:  112/74  Pulse: 60  Weight: 75.2 kg  Height: 5' 10 (1.778 m)     Wt Readings from Last 3 Encounters:  04/05/24 75.2 kg  10/07/23 79.2 kg  09/16/23 81.6 kg    GEN: Well nourished, well developed in no acute distress NECK: No JVD CARDIAC: Regular rate and rhythm with occasional ectopy, no murmurs, rubs, gallops RESPIRATORY:  Clear to auscultation without rales, wheezing or rhonchi  ABDOMEN: Soft, non-tender, non-distended EXTREMITIES:  No edema; No deformity    ECG today demonstrates  V pacing with underlying atrial flutter Vent. rate 60 BPM PR interval * ms QRS duration 168 ms QT/QTcB 492/492 ms   Echo 08/23/19  1. Hypertrophic cardiomyopathy, sigmoid subtype. Basal septum measures 18 mm in maximal dimension. Flow acceleration noted at the LVOT, no LVOT obstruction noted. Chordal systolic anterior motion of the mitral valve without involvment of the anterior leaflet.   2. Left ventricular ejection fraction, by visual estimation, is 60 to  65%. The left ventricle has normal function. There is severely increased  left ventricular hypertrophy.   3. Elevated left atrial and left ventricular end-diastolic pressures.   4. Left ventricular diastolic parameters are consistent with Grade III  diastolic dysfunction (restrictive).   5. Global right ventricle has normal systolic function.The right  ventricular size is normal. No increase in right ventricular wall  thickness.   6. Left atrial size was moderately dilated.   7. Right atrial size was moderately dilated.   8. The mitral valve is normal in structure. Mild mitral valve  regurgitation. No  evidence of mitral stenosis.   9. The tricuspid valve is normal in structure. Tricuspid valve  regurgitation is mild.  10. The aortic valve is tricuspid. Aortic valve regurgitation is not  visualized. No evidence of aortic valve sclerosis or stenosis.  11. The pulmonic valve was normal in structure. Pulmonic valve  regurgitation is mild.   12. Moderately elevated pulmonary artery systolic pressure.  13. The inferior vena cava is dilated in size with <50% respiratory  variability, suggesting right atrial pressure of 15 mmHg.  14. The left ventricle has no regional wall motion abnormalities.    CHA2DS2-VASc Score = 3  The patient's score is based upon: CHF History: 0 HTN History: 0 Diabetes History: 0 Stroke History: 0 Vascular Disease History: 1 (aoritc atherosclerosis) Age Score: 2 Gender Score: 0       ASSESSMENT AND PLAN: Persistent Atrial Fibrillation/atrial flutter (ICD10:  I48.19) The patient's CHA2DS2-VASc score is 3, indicating a 3.2% annual risk of stroke.   S/p ablation 06/2020 and 09/2021 Patient in atrial flutter today, started 7/20.  We discussed rhythm control options including proceeding with DCCV and postponing hernia surgery vs delaying DCCV until after surgery. He would like to pursue DCCV first, will arrange. He will need to be on anticoagulation for at least 4 weeks post DCCV with no missed doses.  Continue amiodarone  200 mg daily Continue Eliquis  5 mg BID Continue diltiazem  30 mg PRN q 4 hours Continue Toprol  50 mg daily  Secondary Hypercoagulable State (ICD10:  D68.69) The patient is at significant risk for stroke/thromboembolism based upon his CHA2DS2-VASc Score of 3.  Continue Apixaban  (Eliquis ). No bleeding issues.   High Risk Medication Monitoring (ICD 10: Z79.899) Intervals on ECG acceptable for amiodarone  monitoring. Check bmet/TSH/cbc today.    HCM S/p ICD, followed by Dr Cindie  VT S/p ICD On amiodarone  Followed by Dr Cindie   Follow up with Dr Cindie or EP APP post DCCV.    Informed Consent   Shared Decision Making/Informed Consent The risks (stroke, cardiac arrhythmias rarely resulting in the need for a temporary or permanent pacemaker, skin irritation or burns and complications associated with conscious sedation including aspiration, arrhythmia, respiratory failure  and death), benefits (restoration of normal sinus rhythm) and alternatives of a direct current cardioversion were explained in detail to Mr. Cost and he agrees to proceed.        Joseph Kicks PA-C Afib Clinic [facility] [address] [place], [place] [address] [phone]

## 2024-04-05 NOTE — H&P (View-Only) (Signed)
 Primary Care Physician: Loreli Elsie JONETTA Mickey., MD Referring Physician:Dr. Cindie  EP: Dr. Cindie Senior Montgomery Favor is a 78 y.o. male with a h/o hypertrophic cardiomyopathy, s/p ICD for VT, atrial fibrillation. Hospitalized for sotalol  administration 02/2017. S/p afib ablation 07/04/20 and 09/18/21. Eventually loaded on amiodarone . Patient is s/p TEE/DCCV on 09/16/23.   Patient returns for follow up for atrial fibrillation and atrial flutter. The device clinic received an alert for an atrial flutter episode starting 04/02/24. There were no specific triggers that the patient could identify. He reports that he is asymptomatic although he admits he has not tried to be very active recently. No bleeding issues on anticoagulation.   Today, he  denies symptoms of palpitations, chest pain, shortness of breath, orthopnea, PND, lower extremity edema, dizziness, presyncope, syncope, snoring, daytime somnolence, bleeding, or neurologic sequela. The patient is tolerating medications without difficulties and is otherwise without complaint today.    Past Medical History:  Diagnosis Date   Acute cholecystitis 06/21/2011   Allergy    SEASONAL   Cataract    Complication of anesthesia    HALLUCINATIONS   ED (erectile dysfunction)    GERD (gastroesophageal reflux disease)    Hearing loss    Helicobacter pylori gastritis 06/2014   Hemorrhoids    Hiatal hernia    Hx of adenomatous colonic polyps 06/2014   Hypertrophic cardiomyopathy (HCC)    Mitral regurgitation    Moderate   Osteopenia    Paroxysmal atrial fibrillation (HCC) 04/2015   documented on ICD remote interrogation, treated with sotalol    Septal defect    LV Septal hyptertrophy 18mm, normal EF, minimal LVOT gradiant.   Tubular adenoma of colon    VT (ventricular tachycardia) (HCC) 08/18/2013   s/p St. Jude ICD   ROS- All systems are reviewed and negative except as per the HPI above  Physical Exam: Vitals:   04/05/24 0924  BP:  112/74  Pulse: 60  Weight: 75.2 kg  Height: 5' 10 (1.778 m)     Wt Readings from Last 3 Encounters:  04/05/24 75.2 kg  10/07/23 79.2 kg  09/16/23 81.6 kg    GEN: Well nourished, well developed in no acute distress NECK: No JVD CARDIAC: Regular rate and rhythm with occasional ectopy, no murmurs, rubs, gallops RESPIRATORY:  Clear to auscultation without rales, wheezing or rhonchi  ABDOMEN: Soft, non-tender, non-distended EXTREMITIES:  No edema; No deformity    ECG today demonstrates  V pacing with underlying atrial flutter Vent. rate 60 BPM PR interval * ms QRS duration 168 ms QT/QTcB 492/492 ms   Echo 08/23/19  1. Hypertrophic cardiomyopathy, sigmoid subtype. Basal septum measures 18 mm in maximal dimension. Flow acceleration noted at the LVOT, no LVOT obstruction noted. Chordal systolic anterior motion of the mitral valve without involvment of the anterior leaflet.   2. Left ventricular ejection fraction, by visual estimation, is 60 to  65%. The left ventricle has normal function. There is severely increased  left ventricular hypertrophy.   3. Elevated left atrial and left ventricular end-diastolic pressures.   4. Left ventricular diastolic parameters are consistent with Grade III  diastolic dysfunction (restrictive).   5. Global right ventricle has normal systolic function.The right  ventricular size is normal. No increase in right ventricular wall  thickness.   6. Left atrial size was moderately dilated.   7. Right atrial size was moderately dilated.   8. The mitral valve is normal in structure. Mild mitral valve  regurgitation. No  evidence of mitral stenosis.   9. The tricuspid valve is normal in structure. Tricuspid valve  regurgitation is mild.  10. The aortic valve is tricuspid. Aortic valve regurgitation is not  visualized. No evidence of aortic valve sclerosis or stenosis.  11. The pulmonic valve was normal in structure. Pulmonic valve  regurgitation is mild.   12. Moderately elevated pulmonary artery systolic pressure.  13. The inferior vena cava is dilated in size with <50% respiratory  variability, suggesting right atrial pressure of 15 mmHg.  14. The left ventricle has no regional wall motion abnormalities.    CHA2DS2-VASc Score = 3  The patient's score is based upon: CHF History: 0 HTN History: 0 Diabetes History: 0 Stroke History: 0 Vascular Disease History: 1 (aoritc atherosclerosis) Age Score: 2 Gender Score: 0       ASSESSMENT AND PLAN: Persistent Atrial Fibrillation/atrial flutter (ICD10:  I48.19) The patient's CHA2DS2-VASc score is 3, indicating a 3.2% annual risk of stroke.   S/p ablation 06/2020 and 09/2021 Patient in atrial flutter today, started 7/20.  We discussed rhythm control options including proceeding with DCCV and postponing hernia surgery vs delaying DCCV until after surgery. He would like to pursue DCCV first, will arrange. He will need to be on anticoagulation for at least 4 weeks post DCCV with no missed doses.  Continue amiodarone  200 mg daily Continue Eliquis  5 mg BID Continue diltiazem  30 mg PRN q 4 hours Continue Toprol  50 mg daily  Secondary Hypercoagulable State (ICD10:  D68.69) The patient is at significant risk for stroke/thromboembolism based upon his CHA2DS2-VASc Score of 3.  Continue Apixaban  (Eliquis ). No bleeding issues.   High Risk Medication Monitoring (ICD 10: Z79.899) Intervals on ECG acceptable for amiodarone  monitoring. Check bmet/TSH/cbc today.    HCM S/p ICD, followed by Dr Cindie  VT S/p ICD On amiodarone  Followed by Dr Cindie   Follow up with Dr Cindie or EP APP post DCCV.    Informed Consent   Shared Decision Making/Informed Consent The risks (stroke, cardiac arrhythmias rarely resulting in the need for a temporary or permanent pacemaker, skin irritation or burns and complications associated with conscious sedation including aspiration, arrhythmia, respiratory failure  and death), benefits (restoration of normal sinus rhythm) and alternatives of a direct current cardioversion were explained in detail to Mr. Cost and he agrees to proceed.        Joseph Kicks PA-C Afib Clinic [facility] [address] [place], [place] [address] [phone]

## 2024-04-05 NOTE — Patient Instructions (Addendum)
 Hernia repair will need to be postponed until 4 weeks after cardioversion please contact your surgeon - (after 8/22)  Follow up with Dr Cindie -- scheduling will contact you.     Cardioversion scheduled for: Thursday, July 24th   - Arrive at the [facility] ([address])  and check in with ADMITTING at 10:30AM   - Do not eat or drink anything after midnight the night prior to your procedure.   - Take all your morning medication (except diabetic medications) with a sip of water prior to arrival.  - Do NOT miss any doses of your blood thinner - if you should miss a dose or take a dose more than 4 hours late -- please notify our office immediately.  - You will not be able to drive home after your procedure. Please ensure you have a responsible adult to drive you home. You will need someone with you for 24 hours post procedure.     - Expect to be in the procedural area approximately 2 hours.   - If you feel as if you go back into normal rhythm prior to scheduled cardioversion, please notify our office immediately.   If your procedure is canceled in the cardioversion suite you will be charged a cancellation fee.

## 2024-04-05 NOTE — Progress Notes (Addendum)
 Called patient with pre-procedure instructions for tomorrow.   Patient informed of:   Arrive for procedure at 0830. Remain NPO past midnight.  Must have a ride home and a responsible adult to remain with them for 24 hours post procedure.  Confirmed blood thinner. Instructed to take am meds with sip of water and confirmed blood thinner consistency.

## 2024-04-06 ENCOUNTER — Ambulatory Visit (HOSPITAL_COMMUNITY): Payer: Self-pay | Admitting: Physician Assistant

## 2024-04-06 ENCOUNTER — Other Ambulatory Visit: Payer: Self-pay

## 2024-04-06 ENCOUNTER — Ambulatory Visit (HOSPITAL_COMMUNITY): Payer: Self-pay | Admitting: Anesthesiology

## 2024-04-06 ENCOUNTER — Encounter (HOSPITAL_COMMUNITY): Payer: Self-pay | Admitting: Internal Medicine

## 2024-04-06 ENCOUNTER — Ambulatory Visit (HOSPITAL_COMMUNITY)
Admission: RE | Admit: 2024-04-06 | Discharge: 2024-04-06 | Disposition: A | Discharge status: Home / Self Care | Attending: Internal Medicine | Admitting: Internal Medicine

## 2024-04-06 ENCOUNTER — Encounter (HOSPITAL_COMMUNITY)
Admission: RE | Disposition: A | Discharge status: Home / Self Care | Payer: Self-pay | Source: Home / Self Care | Attending: Internal Medicine

## 2024-04-06 DIAGNOSIS — I472 Ventricular tachycardia, unspecified: Secondary | ICD-10-CM | POA: Insufficient documentation

## 2024-04-06 DIAGNOSIS — I480 Paroxysmal atrial fibrillation: Secondary | ICD-10-CM | POA: Diagnosis not present

## 2024-04-06 DIAGNOSIS — Z87891 Personal history of nicotine dependence: Secondary | ICD-10-CM | POA: Insufficient documentation

## 2024-04-06 DIAGNOSIS — Z7901 Long term (current) use of anticoagulants: Secondary | ICD-10-CM | POA: Diagnosis not present

## 2024-04-06 DIAGNOSIS — I4892 Unspecified atrial flutter: Secondary | ICD-10-CM | POA: Diagnosis present

## 2024-04-06 DIAGNOSIS — K219 Gastro-esophageal reflux disease without esophagitis: Secondary | ICD-10-CM | POA: Diagnosis not present

## 2024-04-06 DIAGNOSIS — I422 Other hypertrophic cardiomyopathy: Secondary | ICD-10-CM | POA: Diagnosis not present

## 2024-04-06 DIAGNOSIS — Z95810 Presence of automatic (implantable) cardiac defibrillator: Secondary | ICD-10-CM | POA: Insufficient documentation

## 2024-04-06 DIAGNOSIS — Z79899 Other long term (current) drug therapy: Secondary | ICD-10-CM | POA: Insufficient documentation

## 2024-04-06 DIAGNOSIS — D6869 Other thrombophilia: Secondary | ICD-10-CM | POA: Diagnosis not present

## 2024-04-06 DIAGNOSIS — I4819 Other persistent atrial fibrillation: Secondary | ICD-10-CM | POA: Diagnosis not present

## 2024-04-06 DIAGNOSIS — I340 Nonrheumatic mitral (valve) insufficiency: Secondary | ICD-10-CM | POA: Diagnosis not present

## 2024-04-06 HISTORY — PX: CARDIOVERSION: EP1203

## 2024-04-06 LAB — BASIC METABOLIC PANEL WITH GFR
BUN/Creatinine Ratio: 18 (ref 10–24)
BUN: 27 mg/dL (ref 8–27)
CO2: 22 mmol/L (ref 20–29)
Calcium: 9.6 mg/dL (ref 8.6–10.2)
Chloride: 101 mmol/L (ref 96–106)
Creatinine, Ser: 1.52 mg/dL — ABNORMAL HIGH (ref 0.76–1.27)
Glucose: 70 mg/dL (ref 70–99)
Potassium: 4.8 mmol/L (ref 3.5–5.2)
Sodium: 138 mmol/L (ref 134–144)
eGFR: 46 mL/min/1.73 — ABNORMAL LOW (ref >59)

## 2024-04-06 LAB — CBC
Hematocrit: 49.7 % (ref 37.5–51.0)
Hemoglobin: 15.9 g/dL (ref 13.0–17.7)
MCH: 28.5 pg (ref 26.6–33.0)
MCHC: 32 g/dL (ref 31.5–35.7)
MCV: 89 fL (ref 79–97)
Platelets: 235 x10E3/uL (ref 150–450)
RBC: 5.58 x10E6/uL (ref 4.14–5.80)
RDW: 13.7 % (ref 11.6–15.4)
WBC: 6 x10E3/uL (ref 3.4–10.8)

## 2024-04-06 LAB — TSH: TSH: 2.55 u[IU]/mL (ref 0.450–4.500)

## 2024-04-06 SURGERY — CARDIOVERSION (CATH LAB)
Anesthesia: General

## 2024-04-06 MED ORDER — SODIUM CHLORIDE 0.9 % IV SOLN: INTRAVENOUS | Status: DC

## 2024-04-06 MED ORDER — LIDOCAINE 2% (20 MG/ML) 5 ML SYRINGE
INTRAMUSCULAR | Status: DC | PRN
  Administered 2024-04-06: 60 mg via INTRAVENOUS

## 2024-04-06 MED ORDER — PROPOFOL 10 MG/ML IV BOLUS
INTRAVENOUS | Status: DC | PRN
  Administered 2024-04-06: 50 mg via INTRAVENOUS
  Administered 2024-04-06: 30 mg via INTRAVENOUS

## 2024-04-06 SURGICAL SUPPLY — 1 items: PAD DEFIB RADIO PHYSIO CONN (PAD) ×1 IMPLANT

## 2024-04-06 NOTE — Transfer of Care (Signed)
 Immediate Anesthesia Transfer of Care Note  Patient: Joseph Morrow [facility]  Procedure(s) Performed: CARDIOVERSION  Patient Location: PACU  Anesthesia Type:MAC  Level of Consciousness: awake and drowsy  Airway & Oxygen Therapy: Patient Spontanous Breathing and Patient connected to nasal cannula oxygen  Post-op Assessment: Report given to RN and Post -op Vital signs reviewed and stable  Post vital signs: Reviewed and stable  Last Vitals:  Vitals Value Taken Time  BP    Temp    Pulse    Resp    SpO2      Last Pain:  Vitals:   04/06/24 0846  TempSrc:   PainSc: 0-No pain         Complications: No notable events documented.

## 2024-04-06 NOTE — CV Procedure (Signed)
    Electrical Cardioversion Procedure Note Kelton Bultman [id number] 19-Mar-1946  Procedure: Electrical Cardioversion Indications:  Atrial Flutter  Time Out: Verified patient identification, verified procedure,medications/allergies/relevent history reviewed, required imaging and test results available.  Performed  Procedure Details  The patient was NPO after midnight. Anesthesia was administered at the beside  by Dr.Woodrum .  Cardioversion was done with synchronized biphasic defibrillation with AP pads with 200 Joules.  The patient converted to AP/VP rhythm. The patient tolerated the procedure well.  IMPRESSION:  Successful cardioversion of atrial flutter.   Stanly Leavens, MD FASE [facility] Cardiologist [facility]  [address] [place], [address] [place], [place] [address] [phone]  10:51 AM

## 2024-04-06 NOTE — Interval H&P Note (Signed)
 History and Physical Interval Note:  04/06/2024 9:27 AM  Joseph Morrow  has presented today for surgery, with the diagnosis of AFIB.  The various methods of treatment have been discussed with the patient and family. After consideration of risks, benefits and other options for treatment, the patient has consented to  Procedure(s): CARDIOVERSION (N/A) as a surgical intervention.  The patient's history has been reviewed, patient examined, no change in status, stable for surgery.  I have reviewed the patient's chart and labs.  Questions were answered to the patient's satisfaction.     [name] A Lasheika Ortloff

## 2024-04-06 NOTE — Discharge Instructions (Signed)

## 2024-04-06 NOTE — Anesthesia Postprocedure Evaluation (Signed)
 Anesthesia Post Note  Patient: Joseph Morrow [facility]  Procedure(s) Performed: CARDIOVERSION     Patient location during evaluation: Specials Recovery Anesthesia Type: General Level of consciousness: awake and alert Pain management: pain level controlled Vital Signs Assessment: post-procedure vital signs reviewed and stable Respiratory status: spontaneous breathing, nonlabored ventilation, respiratory function stable and patient connected to nasal cannula oxygen Cardiovascular status: blood pressure returned to baseline and stable Postop Assessment: no apparent nausea or vomiting Anesthetic complications: no   No notable events documented.  Last Vitals:  Vitals:   04/06/24 1004 04/06/24 1014  BP: 91/62 97/69  Pulse: (!) 50 (!) 50  Resp: 16 14  Temp:    SpO2: 97% 97%    Last Pain:  Vitals:   04/06/24 1014  TempSrc:   PainSc: 0-No pain                 Joseph Morrow

## 2024-04-23 NOTE — Progress Notes (Unsigned)
 Cardiology Office Note:  .   Date:  04/23/2024  ID:  [name], DOB 1945-10-12, MRN [id number] PCP: Loreli Elsie JONETTA Mickey., MD  [facility] Providers Cardiologist:  None Electrophysiologist:  OLE ONEIDA HOLTS, MD {  History of Present Illness: .   Adrian Dinovo is a 78 y.o. male w/PMHx of  HCM AFib, VT ICD   He last saw Dr. HOLTS 12/16/22, discussed hx of multiple ablation, the most recent Jan 2023, Intermittent AFib Pt suspected he was in Afib that visit w/complaints of fatigue . Device noted PAFib, none that day Continued amiodarone   Seeing Afib clinic a few times, as well as Jodie.  Has had more AFib over the year or so  Arrived to DCCV 09/09/23> device interrogation noted PAFib with episodes lasting ~ hour >> advised increased amiodarone  > DCCV cancelled  In Dr. Hiram review, felt he had been in persistent AFib that device was under-calling burden 2/2 under-sensed P waves and did recommend pursuing DCCV  09/16/23: TEE/DCCV  Severe bi atrial enlargement No LAA thrombus Mild MR EF 55% Mild RVE and hyupokinesis Mild TR Device leads in RA/RV Trivial PFO AV sclerosis  Successful DCCV  AFib clinic 7/23/5 2/2 device alert for AF, seems he was largely asymptomatic and unaware.  04/06/24: DCCV > SR   Today's visit is scheduled as a 28mo device visit/post DCCV ROS:   He is doing well, no CP, palpitations or cardiac awareness Unclear how symptomatic he is with his AFib anymore, though mentions DOE with longer walks and carrying things, no rest SOB, no difficulties with ADLs No CP No dizziness, near syncope or syncope No bleeding or signs of bleeding  He thinks his AFib of late maybe because he was not using his dental appliance for sleep apnea  Device information SJM dual chamber ICD implanted 08/18/2013  Secondary prevention   Arrhythmia/AAD hx 09/18/21 Ablation left pulmonary veins were reisolated. The posterior wall was  isolated. A perimitral flutter was also targeted. Typical flutter was also targeted   Amiodarone  VT started 2014 for VT > stopped ? When/why Sotalol  started 2018 for AFib Amiodarone  started Oct 2023  Studies Reviewed: SABRA    EKG not done today  DEVICE interrogation done today and reviewed by myself Battery and lead measurements are good Numerous AMS episodes All available EGMs are reviewed ALL AMS episodes triggered by a single AS event and no true AFnoted He conducts slowly ~435ms today,  No R waves pacing at 40 alone  When he is put into an AMS episode in review of his EGMs > triggers VP > often FF on the A channel causes asynchronous V pacing   Reviewed at length today with industry Mode switch trigger changed from AMS > AT/AF Given FF on the A channel, PVAB lengthened fro 70ms > 140  Note mode switch rate is at 140 (likely intentional) and left unchanged   09/18/21: EPS/ablation CONCLUSIONS: 1. Pulmonary vein isolation (wide antral circumferential lesion set) with ablation required to reisolate the left pulmonary veins 2. Posterior wall ablation and isolation  3. Ablation of atypical, perimitral atrial flutter 4. Ablation of typical atrial flutter 5. Intracardiac echocardiography reveals trivial pericardial effusion 6. No early apparent complications. 7. Add Colchicine  0.6mg  PO BID x 5 days 8. Protonix  40mg  PO BID x 45 days  07/04/20: EPS/ablation CONCLUSIONS: 1. Sinus rhythm upon presentation.   2. Intracardiac echo reveals a moderate to large sized left atrium. 3. Successful electrical isolation and anatomical encircling of  all four pulmonary veins with radiofrequency current. 4. Additional mapping and ablation within the left atrium due to persistence of atrial fibrillation with a posterior wall box demonstrated  5. Atrial fibrillation successfully cardioverted to sinus rhythm but quickly returned to afib with multiple cardioversion attempts made. 6. Cavo-tricuspid  isthmus ablation performed due to transient typical atrial flutter observed today 7. At least 3 left atrial flutter circuits were observed today.  These were not stable for 3D mapping or ablation today 8. No early apparent complications.   Echo 08/23/19  1. Hypertrophic cardiomyopathy, sigmoid subtype. Basal septum measures 18 mm in maximal dimension. Flow acceleration noted at the LVOT, no LVOT obstruction noted. Chordal systolic anterior motion of the mitral valve without involvment of the anterior leaflet.   2. Left ventricular ejection fraction, by visual estimation, is 60 to  65%. The left ventricle has normal function. There is severely increased  left ventricular hypertrophy.   3. Elevated left atrial and left ventricular end-diastolic pressures.   4. Left ventricular diastolic parameters are consistent with Grade III  diastolic dysfunction (restrictive).   5. Global right ventricle has normal systolic function.The right  ventricular size is normal. No increase in right ventricular wall  thickness.   6. Left atrial size was moderately dilated.   7. Right atrial size was moderately dilated.   8. The mitral valve is normal in structure. Mild mitral valve  regurgitation. No evidence of mitral stenosis.   9. The tricuspid valve is normal in structure. Tricuspid valve  regurgitation is mild.  10. The aortic valve is tricuspid. Aortic valve regurgitation is not  visualized. No evidence of aortic valve sclerosis or stenosis.  11. The pulmonic valve was normal in structure. Pulmonic valve  regurgitation is mild.  12. Moderately elevated pulmonary artery systolic pressure.  13. The inferior vena cava is dilated in size with <50% respiratory  variability, suggesting right atrial pressure of 15 mmHg.  14. The left ventricle has no regional wall motion abnormalities.     Risk Assessment/Calculations:    Physical Exam:   VS:  There were no vitals taken for this visit.   Wt Readings from  Last 3 Encounters:  04/06/24 160 lb (72.6 kg)  04/05/24 165 lb 12.8 oz (75.2 kg)  10/07/23 174 lb 9.6 oz (79.2 kg)    GEN: Well nourished, well developed in no acute distress NECK: No JVD; No carotid bruits CARDIAC: RRR, no murmurs, rubs, gallops RESPIRATORY:  CTA b/l without rales, wheezing or rhonchi  ABDOMEN: Soft, non-tender, non-distended EXTREMITIES: No edema; No deformity    ICD site: is stable, no thinning, fluctuation, tethering  ASSESSMENT AND PLAN: .    persistent AFib Atrial flutter CHA2DS2Vasc is 4, on Eliquis , appropriately dosed No true AF noted post DCCV Amiodarone  LFTs today  He mentions that he thinks AFib perhaps triggered by untreated apnea Faithfully using his dental appliance now  HCM VT ICD Secondary prevention device intact function Programming changes as above  Secondary hypercoagulable state 2/2 AFib   Dispo: back in 3-4 weeks f/u on programming chages/AF burden  Signed, Charlies Macario Arthur, PA-C

## 2024-04-24 DIAGNOSIS — M5459 Other low back pain: Secondary | ICD-10-CM | POA: Diagnosis not present

## 2024-04-24 DIAGNOSIS — M9901 Segmental and somatic dysfunction of cervical region: Secondary | ICD-10-CM | POA: Diagnosis not present

## 2024-04-24 DIAGNOSIS — R293 Abnormal posture: Secondary | ICD-10-CM | POA: Diagnosis not present

## 2024-04-24 DIAGNOSIS — M50222 Other cervical disc displacement at C5-C6 level: Secondary | ICD-10-CM | POA: Diagnosis not present

## 2024-04-24 DIAGNOSIS — M9902 Segmental and somatic dysfunction of thoracic region: Secondary | ICD-10-CM | POA: Diagnosis not present

## 2024-04-24 DIAGNOSIS — M542 Cervicalgia: Secondary | ICD-10-CM | POA: Diagnosis not present

## 2024-04-24 DIAGNOSIS — M5137 Other intervertebral disc degeneration, lumbosacral region with discogenic back pain only: Secondary | ICD-10-CM | POA: Diagnosis not present

## 2024-04-24 DIAGNOSIS — M791 Myalgia, unspecified site: Secondary | ICD-10-CM | POA: Diagnosis not present

## 2024-04-24 DIAGNOSIS — M9903 Segmental and somatic dysfunction of lumbar region: Secondary | ICD-10-CM | POA: Diagnosis not present

## 2024-04-25 ENCOUNTER — Encounter: Payer: Self-pay | Admitting: Physician Assistant

## 2024-04-25 ENCOUNTER — Ambulatory Visit: Payer: Self-pay | Admitting: Cardiology

## 2024-04-25 ENCOUNTER — Ambulatory Visit: Attending: Physician Assistant | Admitting: Physician Assistant

## 2024-04-25 VITALS — BP 110/68 | HR 54 | Ht 70.0 in | Wt 169.4 lb

## 2024-04-25 DIAGNOSIS — I472 Ventricular tachycardia, unspecified: Secondary | ICD-10-CM | POA: Diagnosis not present

## 2024-04-25 DIAGNOSIS — I4892 Unspecified atrial flutter: Secondary | ICD-10-CM | POA: Diagnosis not present

## 2024-04-25 DIAGNOSIS — Z95810 Presence of automatic (implantable) cardiac defibrillator: Secondary | ICD-10-CM | POA: Diagnosis not present

## 2024-04-25 DIAGNOSIS — I4819 Other persistent atrial fibrillation: Secondary | ICD-10-CM | POA: Insufficient documentation

## 2024-04-25 DIAGNOSIS — Z79899 Other long term (current) drug therapy: Secondary | ICD-10-CM | POA: Diagnosis not present

## 2024-04-25 DIAGNOSIS — I422 Other hypertrophic cardiomyopathy: Secondary | ICD-10-CM | POA: Diagnosis not present

## 2024-04-25 LAB — CUP PACEART INCLINIC DEVICE CHECK
Battery Remaining Longevity: 25 mo
Brady Statistic RA Percent Paced: 77 %
Brady Statistic RV Percent Paced: 99.91 %
Date Time Interrogation Session: 20250812163012
HighPow Impedance: 44.2222
Implantable Lead Connection Status: 753985
Implantable Lead Connection Status: 753985
Implantable Lead Implant Date: 20141205
Implantable Lead Implant Date: 20141205
Implantable Lead Location: 753859
Implantable Lead Location: 753860
Implantable Lead Model: 7121
Implantable Pulse Generator Implant Date: 20171221
Lead Channel Impedance Value: 412.5 Ohm
Lead Channel Impedance Value: 475 Ohm
Lead Channel Pacing Threshold Amplitude: 0.75 V
Lead Channel Pacing Threshold Amplitude: 0.75 V
Lead Channel Pacing Threshold Amplitude: 1 V
Lead Channel Pacing Threshold Pulse Width: 0.5 ms
Lead Channel Pacing Threshold Pulse Width: 0.5 ms
Lead Channel Pacing Threshold Pulse Width: 0.5 ms
Lead Channel Sensing Intrinsic Amplitude: 12 mV
Lead Channel Sensing Intrinsic Amplitude: 3 mV
Lead Channel Setting Pacing Amplitude: 2 V
Lead Channel Setting Pacing Amplitude: 2.5 V
Lead Channel Setting Pacing Pulse Width: 0.5 ms
Lead Channel Setting Sensing Sensitivity: 0.5 mV
Pulse Gen Serial Number: 7387049
Zone Setting Status: 755011

## 2024-04-25 MED ORDER — AMIODARONE HCL 200 MG PO TABS
200.0000 mg | ORAL_TABLET | Freq: Every day | ORAL | Status: AC
Start: 2024-04-25 — End: ?

## 2024-04-25 NOTE — Patient Instructions (Signed)
 Medication Instructions:  Continue to take amiodarone  200 mg only once daily. *If you need a refill on your cardiac medications before your next appointment, please call your pharmacy*  Lab Work: LFT'S-TODAY If you have labs (blood work) drawn today and your tests are completely normal, you will receive your results only by: MyChart Message (if you have MyChart) OR A paper copy in the mail If you have any lab test that is abnormal or we need to change your treatment, we will call you to review the results.  Follow-Up: At [facility], you and your health needs are our priority.  As part of our continuing mission to provide you with exceptional heart care, our providers are all part of one team.  This team includes your primary Cardiologist (physician) and Advanced Practice Providers or APPs (Physician Assistants and Nurse Practitioners) who all work together to provide you with the care you need, when you need it.  Your next appointment:   3-4 week(s)  Provider:   Ole Holts, MD or Charlies Arthur, PA-C

## 2024-04-26 LAB — HEPATIC FUNCTION PANEL
ALT: 35 IU/L (ref 0–44)
AST: 27 IU/L (ref 0–40)
Albumin: 3.9 g/dL (ref 3.8–4.8)
Alkaline Phosphatase: 105 IU/L (ref 44–121)
Bilirubin Total: 0.6 mg/dL (ref 0.0–1.2)
Bilirubin, Direct: 0.19 mg/dL (ref 0.00–0.40)
Total Protein: 6.6 g/dL (ref 6.0–8.5)

## 2024-05-01 NOTE — Progress Notes (Signed)
 Remote ICD transmission.

## 2024-05-01 NOTE — Telephone Encounter (Signed)
 Transmission Reviewed: Normal Device Function.  No New Episodes AT/AF burden is 0%.  Corvue indicates concern for fluid retention in recent weeks; however, appears resolving in recent days, likely correlates with symptom improvement on the lasix .   Responded to patient via mychart and informed of transmission and Renee's remarks.  He will let us  know if he is taking Lasix  regularly so we can schedule a BMET.   ER precautions given if symptoms become severe; however, do appear to be improving from patient's recent report.

## 2024-05-10 ENCOUNTER — Encounter: Payer: Self-pay | Admitting: Cardiology

## 2024-05-10 ENCOUNTER — Encounter (HOSPITAL_COMMUNITY): Payer: Self-pay | Admitting: General Surgery

## 2024-05-10 ENCOUNTER — Other Ambulatory Visit: Payer: Self-pay

## 2024-05-10 ENCOUNTER — Telehealth: Payer: Self-pay | Admitting: Cardiology

## 2024-05-10 NOTE — Telephone Encounter (Signed)
 Patient states he has surgery tomorrow and wanted to make sure he hasn't had any more AFIB.  I reviewed transmission and there have been no further recorded AF events. Af BURDEN currently 0%.   Patient made aware and thanks me for the follow up.

## 2024-05-10 NOTE — Telephone Encounter (Signed)
 Patient called to follow-up on transmission.

## 2024-05-10 NOTE — Progress Notes (Signed)
 PERIOPERATIVE PRESCRIPTION FOR IMPLANTED CARDIAC DEVICE PROGRAMMING  Patient Information: Name:  Joseph Morrow  DOB:  1946-06-16  MRN:  [id number]    Planned Procedure:  Laparoscopic Inguinal Repair  Surgeon:  Dr. Rubin  Date of Procedure:  05-11-24  Cautery will be used.  Position during surgery:  supine   Please send documentation back to:  [facility] (Fax # [phone])  Device Information:  Clinic EP Physician:  Dr. Ole Holts   Device Type:  Defibrillator Manufacturer and Phone #:  St. Jude/[employer]ott: [phone] Pacemaker Dependent?:  Yes.   Date of Last Device Check:  05/10/2024  Normal Device Function?:  Yes.    Electrophysiologist's Recommendations:  Have magnet available. Provide continuous ECG monitoring when magnet is used or reprogramming is to be performed.  Procedure may interfere with device function.  Magnet should be placed over device during procedure.  Per Device Clinic Standing Orders, Almarie ONEIDA Shutter, RN  11:50 AM 05/10/2024

## 2024-05-10 NOTE — Progress Notes (Addendum)
 PCP - Loreli Elsie JONETTA Mickey., MD  Cardiologist -  Electrophysiology - Cardiology Cindie Ole DASEN, MD  PPM/ICD - ICD [facility]. Jude) Device Orders - requested Rep Notified - left message for [place]. Holly with [employer] made aware of procedure and request for orders.   Chest x-ray - denies EKG - 04-06-24 Stress Test - denies ECHO - 08-23-19 Cardiac Cath - denies  CPAP - use a dental appliance for OSA  Dm denies  Blood Thinner Instructions: ELIQUIS  Last dose 05-08-24 Aspirin  Instructions: n/a  ERAS Protcol - clear liquids until 7:45   COVID TEST- n/a  Anesthesia review: yes, Hx Afib and clearance  Patient verbally denies any shortness of breath, fever, cough and chest pain during phone call   -------------  SDW INSTRUCTIONS given:  Your procedure is scheduled on May 11, 2024.  Report to [facility] Main Entrance A at 8:15 A.M., and check in at the Admitting office.  Call this number if you have problems the morning of surgery:  [phone]   Remember:  Do not eat after midnight the night before your surgery  You may drink clear liquids until 7:45 the morning of your surgery.   Clear liquids allowed are: Water, Non-Citrus Juices (without pulp), Carbonated Beverages, Clear Tea, Black Coffee Only, and Gatorade    Take these medicines the morning of surgery with A SIP OF WATER  acetaminophen  (TYLENOL )  diltiazem  (CARDIZEM )  amiodarone  (PACERONE )  metoprolol  succinate (TOPROL -XL)  Simethicone   As of today, STOP taking any Aspirin  (unless otherwise instructed by your surgeon) Aleve, Naproxen, Ibuprofen, Motrin, Advil, Goody's, BC's, all herbal medications, fish oil, and all vitamins.                      Do not wear jewelry, make up, or nail polish            Do not wear lotions, powders, perfumes/colognes, or deodorant.            Do not shave 48 hours prior to surgery.  Men may shave face and neck.            Do not bring valuables to the hospital.             [facility] is not responsible for any belongings or valuables.  Do NOT Smoke (Tobacco/Vaping) 24 hours prior to your procedure If you use a CPAP at night, you may bring all equipment for your overnight stay.   Contacts, glasses, dentures or bridgework may not be worn into surgery.      For patients admitted to the hospital, discharge time will be determined by your treatment team.   Patients discharged the day of surgery will not be allowed to drive home, and someone needs to stay with them for 24 hours.    Special instructions:   [place]- Preparing For Surgery  Before surgery, you can play an important role. Because skin is not sterile, your skin needs to be as free of germs as possible. You can reduce the number of germs on your skin by washing with CHG (chlorahexidine gluconate) Soap before surgery.  CHG is an antiseptic cleaner which kills germs and bonds with the skin to continue killing germs even after washing.    Oral Hygiene is also important to reduce your risk of infection.  Remember - BRUSH YOUR TEETH THE MORNING OF SURGERY WITH YOUR REGULAR TOOTHPASTE  Please do not use if you have an allergy to CHG or  antibacterial soaps. If your skin becomes reddened/irritated stop using the CHG.  Do not shave (including legs and underarms) for at least 48 hours prior to first CHG shower. It is OK to shave your face.  Please follow these instructions carefully.   Shower the NIGHT BEFORE SURGERY and the MORNING OF SURGERY with DIAL Soap.   Pat yourself dry with a CLEAN TOWEL.  Wear CLEAN PAJAMAS to bed the night before surgery  Place CLEAN SHEETS on your bed the night of your first shower and DO NOT SLEEP WITH PETS.   Day of Surgery: Please shower morning of surgery  Wear Clean/Comfortable clothing the morning of surgery Do not apply any deodorants/lotions.   Remember to brush your teeth WITH YOUR REGULAR TOOTHPASTE.   Questions were answered. Patient verbalized  understanding of instructions.

## 2024-05-10 NOTE — Progress Notes (Signed)
 Anesthesia Chart Review: Same day workup  78 year old male follows with cardiology for history of HCM (no LVOT obstruction noted on TTE 08/23/2019), VT s/p Saint Jude dual-chamber ICD 2014, A-fib s/p multiple ablations and cardioversions, OSA treated with dental appliance.  TEE 09/16/23 showed EF 55%, RV systolic function mildly reduced, severe biatrial enlargement, mild to moderate MR, small PFO.  Patient had DCCV 09/16/2023 that was initially successful, however, he was noted to be in A-fib follow-up on 04/05/2024 and underwent DCCV on 04/06/2024 with conversion to sinus rhythm.  Seen in follow-up in EP clinic by Joseph Arthur, PA-C on 04/25/2024.  No A-fib noted post DCCV.  He was continued on Eliquis  and amiodarone  as well as Toprol  50 mg daily and diltiazem  30 mg as needed..  Device check 05/01/2024 with 0% AT/AF burden.  He had previously been advised by Joseph Kicks, PA-C that he can proceed with hernia surgery once he had completed 30 days of anticoagulation following DCCV.  Pt reports LD Eliquis  05/08/24.  Other pertinent history includes former smoker (40 pack years, quit 1982), GERD.  Patient will need day of surgery labs and evaluation.  EKG 04/06/2024: AV dual paced rhythm.  Rate 53.  PERIOPERATIVE PRESCRIPTION FOR IMPLANTED CARDIAC DEVICE PROGRAMMING 05/10/24: Device Information:   Clinic EP Physician:  Dr. Ole Holts    Device Type:  Defibrillator Manufacturer and Phone #:  St. Jude/[employer]: [phone] Pacemaker Dependent?:  Yes.   Date of Last Device Check:  05/10/2024      Normal Device Function?:  Yes.     Electrophysiologist's Recommendations:   Have magnet available. Provide continuous ECG monitoring when magnet is used or reprogramming is to be performed.  Procedure may interfere with device function.  Magnet should be placed over device during procedure.  TEE/DCCV 09/16/2023: IMPRESSIONS   1. No LAA thrombus St Jude device EMG;s showed atrial flutter/fib DCC x 1  200 J  biphasic converted to NSR with no immediate neurologic sequelae. Has  been on eliquis  for over 2 weeks.   2. Left ventricular ejection fraction, by estimation, is 55%. The left  ventricle has normal function.   3. Device wires in RA/RV. Right ventricular systolic function is mildly  reduced. The right ventricular size is mildly enlarged.   4. Left atrial size was severely dilated. No left atrial/left atrial  appendage thrombus was detected.   5. Right atrial size was severely dilated.   6. The mitral valve is degenerative. Mild to moderate mitral valve  regurgitation.   7. Tricuspid valve regurgitation is mild to moderate.   8. The aortic valve is tricuspid. Aortic valve regurgitation is not  visualized. No aortic stenosis is present.   9. Small PFO.     Joseph Morrow [facility] Short Stay Center/Anesthesiology Phone [phone] 05/10/2024 12:07 PM

## 2024-05-10 NOTE — H&P (Signed)
 Chief Complaint: Inguinal Hernia   History of Present Illness: Joseph Morrow is a 78 y.o. male who is seen today as an office consultation at the request of Dr. Loreli for evaluation of Inguinal Hernia .  History of Present Illness Joseph Morrow is a 78 year old male who presents for surgical evaluation of a suspected bilateral inguinal hernia. He was referred by Dr. Loreli for evaluation of a suspected hernia.  He has noticed a bulge in the groin area for approximately six months, which has increased in size and become slightly more painful over time. Pain occurs primarily with straining, but not with walking or other daily activities. The hernia is present bilaterally. He has not had any previous abdominal surgeries.  He has an irregular heartbeat and atrial fibrillation, for which he is taking Eliquis . He can easily go into atrial fibrillation. He previously underwent a colonoscopy while in atrial fibrillation, which was managed without complications. HE sees Dr. Cindie  Review of Systems: A complete review of systems was obtained from the patient. I have reviewed this information and discussed as appropriate with the patient. See HPI as well for other ROS.  Review of Systems  Constitutional: Negative.  HENT: Negative.  Eyes: Negative.  Respiratory: Negative.  Cardiovascular: Negative.  Gastrointestinal: Negative.  Genitourinary: Negative.  Musculoskeletal: Negative.  Skin: Negative.  Neurological: Negative.  Endo/Heme/Allergies: Negative.  Psychiatric/Behavioral: Negative.    Medical History: Past Medical History:  Diagnosis Date  Atrial fibrillation (CMS/HHS-HCC) 12/2016  Cardiomyopathy, hypertrophic (CMS/HHS-HCC)  GERD (gastroesophageal reflux disease)  Vision abnormalities   Patient Active Problem List  Diagnosis  PAF (paroxysmal atrial fibrillation) (CMS/HHS-HCC)  Hypertrophic cardiomyopathy (CMS/HHS-HCC)  ICD (implantable cardioverter-defibrillator) in  place  GERD (gastroesophageal reflux disease)  Ventricular tachycardia, unspecified (CMS/HHS-HCC)   Past Surgical History:  Procedure Laterality Date  LENS EYE SURGERY Right 02/24/2010  Phaco/IOL with Alcon [id number] 10.5D  LENS EYE SURGERY Left 05/12/2010  Phaco/IOL ReSTOR [id number] D  IMPLANTABLE CARDIOVERTER DEFIBRILLATOR 08/18/2013  Dr. Kelsie  ICD Generator Changeout 09/03/2016  Dr. Kelsie  CARDIOVERSION INT 02/25/2017  Dr. MYRTIS Gull; and 04/27/22 and 09/15/2022  ATRIAL FIBRILLATION ABLATION 09/03/2020  Dr. Kelsie; and 09/18/2021  APPENDECTOMY  CHOLECYSTECTOMY OPEN    Allergies  Allergen Reactions  Prednisone Anxiety  Extreme anxiety, hallucinations  Androgenic Anabolic Steroid Other (See Comments)  Antihistamines - Alkylamine Anxiety  Dextromethorphan Other (See Comments)  nervous  Midazolam  Hallucination and Headache  hallucinations, headache   Current Outpatient Medications on File Prior to Visit  Medication Sig Dispense Refill  AMIOdarone  (PACERONE ) 200 MG tablet 1 tablet Orally twice a day  FUROsemide  (LASIX ) 20 MG tablet Take by mouth  potassium citrate  (UROCIT-K ) 10 mEq ER tablet Take 10 mEq by mouth  *glucosamine sulfate oral  *zinc  oral  Bifidobacterium infantis (ALIGN ORAL) Take 1 capsule by mouth every morning  calcium carbonate 300 mg (750 mg) chewable tablet Take by mouth  cholecalciferol (VITAMIN D3) 1000 unit capsule Take 1,000 Units by mouth once daily  coenzyme Q10 (CO Q-10) 10 mg capsule Take 10 mg by mouth once daily  ELIQUIS  5 mg tablet TK 1 T PO BID. 6  metoprolol  succinate (TOPROL -XL) 25 MG XL tablet TAKE 1/2 TABLET BY MOUTH EVERY MORNING AND 1/2 TABLET AT BEDTIME  metoprolol  SUCCinate (TOPROL -XL) 50 MG XL tablet Take 50 mg by mouth  omeprazole  (PRILOSEC OTC) 20 MG EC tablet Take 20 mg by mouth once daily 1/2 tablet once a day  sotaloL  (BETAPACE ) 120 MG tablet Take 1 tablet (120  mg total) by mouth every 12 (twelve) hours   No current  facility-administered medications on file prior to visit.   Family History  Problem Relation Age of Onset  Cataracts Mother 62  Irregular Heart Beat (Arrhythmia) Mother  Heart failure Mother  Diabetes Father  Pneumonia Father  Coronary Artery Disease (Blocked arteries around heart) Brother  Diabetes Brother  Myocardial Infarction (Heart attack) Brother  Sleep apnea Brother  Blindness Neg Hx  Glaucoma Neg Hx  Macular degeneration Neg Hx    Social History   Tobacco Use  Smoking Status Former  Smokeless Tobacco Not on file    Social History   Socioeconomic History  Marital status: Married  Occupational History  Occupation: Retired  Tobacco Use  Smoking status: Former  Substance and Sexual Activity  Alcohol  use: Never  Drug use: No   Social Drivers of Architectural technologist Insecurity: No Food Insecurity (08/14/2022)  Received from [facility]  Hunger Vital Sign  Worried About Running Out of Food in the Last Year: Never true  Ran Out of Food in the Last Year: Never true  Transportation Needs: No Transportation Needs (08/14/2022)  [facility] from [facility] - Transportation  Lack of Transportation (Medical): No  Lack of Transportation (Non-Medical): No  Housing Stability: Unknown (10/28/2023)  Housing Stability Vital Sign  Homeless in the Last Year: No   Objective:   Vitals:  02/11/24 0827  BP: 122/76  Pulse: 69  Temp: 36.7 C (98.1 F)  SpO2: 98%  Weight: 76.3 kg (168 lb 3.2 oz)  Height: 177.8 cm (5' 10)  PainSc: 0-No pain   Body mass index is 24.13 kg/m. Physical Exam Constitutional:  Appearance: Normal appearance.  HENT:  Head: Normocephalic and atraumatic.  Nose: Nose normal. No congestion.  Mouth/Throat:  Mouth: Mucous membranes are moist.  Pharynx: Oropharynx is clear.  Eyes:  Pupils: Pupils are equal, round, and reactive to light.  Cardiovascular:  Rate and Rhythm: Normal rate and regular rhythm.  Pulses: Normal pulses.  Heart sounds:  Normal heart sounds. No murmur heard. No friction rub. No gallop.  Pulmonary:  Effort: Pulmonary effort is normal. No respiratory distress.  Breath sounds: Normal breath sounds. No stridor. No wheezing, rhonchi or rales.  Abdominal:  General: Abdomen is flat.  Hernia: A hernia is present. Hernia is present in the left inguinal area and right inguinal area.  Musculoskeletal:  General: Normal range of motion.  Cervical back: Normal range of motion.  Skin: General: Skin is warm and dry.  Neurological:  General: No focal deficit present.  Mental Status: He is alert and oriented to person, place, and time.  Psychiatric:  Mood and Affect: Mood normal.  Thought Content: Thought content normal.     Assessment and Plan:  Diagnoses and all orders for this visit:  Bilateral inguinal hernia without obstruction or gangrene, recurrence not specified   Joseph Morrow is a 78 y.o. male   We will proceed to the OR for a lap bilateral inguinal hernia repair with mesh. All risks and benefits were discussed with the patient, to generally include infection, bleeding, damage to surrounding structures, acute and chronic nerve pain, and recurrence. Alternatives were offered and described. All questions were answered and the patient voiced understanding of the procedure and wishes to proceed at this point.  No follow-ups on file.  Lynda Leos, MD, [facility], [place] General & Minimally Invasive Surgery

## 2024-05-10 NOTE — Anesthesia Preprocedure Evaluation (Signed)
 Anesthesia Evaluation  Patient identified by MRN, date of birth, ID band Patient awake    Reviewed: Allergy & Precautions, NPO status , Patient's Chart, lab work & pertinent test results  History of Anesthesia Complications (+) history of anesthetic complications (hallucinations w/ one surgery- thinks it was a gall bladder surgery)  Airway Mallampati: III  TM Distance: >3 FB Neck ROM: Full    Dental  (+) Teeth Intact, Dental Advisory Given   Pulmonary shortness of breath (SOB since having PPM adjusted) and with exertion, sleep apnea (uses dental device) , former smoker 40 pack year history, quit smoking 1982   Pulmonary exam normal breath sounds clear to auscultation       Cardiovascular +CHF  Normal cardiovascular exam+ dysrhythmias (eliquis  LD 8/25) Atrial Fibrillation and Ventricular Tachycardia + Cardiac Defibrillator + Valvular Problems/Murmurs (mild-mod MR, mild-mod TR) MR  Rhythm:Regular Rate:Normal  Echo 2025 1. No LAA thrombus St Jude device EMG;s showed atrial flutter/fib DCC x 1  200 J biphasic converted to NSR with no immediate neurologic sequelae. Has  been on eliquis  for over 2 weeks.   2. Left ventricular ejection fraction, by estimation, is 55%. The left  ventricle has normal function.   3. Device wires in RA/RV. Right ventricular systolic function is mildly  reduced. The right ventricular size is mildly enlarged.   4. Left atrial size was severely dilated. No left atrial/left atrial  appendage thrombus was detected.   5. Right atrial size was severely dilated.   6. The mitral valve is degenerative. Mild to moderate mitral valve  regurgitation.   7. Tricuspid valve regurgitation is mild to moderate.   8. The aortic valve is tricuspid. Aortic valve regurgitation is not  visualized. No aortic stenosis is present.   9. Small PFO.    HCM (no LVOT obstruction noted on TTE 08/23/2019), VT s/p Saint Jude dual-chamber  ICD 2014, A-fib s/p multiple ablations and cardioversions   Neuro/Psych negative neurological ROS  negative psych ROS   GI/Hepatic Neg liver ROS, hiatal hernia,GERD  Controlled,,  Endo/Other  negative endocrine ROS    Renal/GU negative Renal ROS  negative genitourinary   Musculoskeletal negative musculoskeletal ROS (+)    Abdominal   Peds  Hematology negative hematology ROS (+)   Anesthesia Other Findings   Reproductive/Obstetrics negative OB ROS                              Anesthesia Physical Anesthesia Plan  ASA: 3  Anesthesia Plan: General   Post-op Pain Management: Tylenol  PO (pre-op)*   Induction: Intravenous  PONV Risk Score and Plan: 2 and Ondansetron , Dexamethasone  and Treatment may vary due to age or medical condition  Airway Management Planned: Oral ETT  Additional Equipment: ClearSight  Intra-op Plan:   Post-operative Plan: Extubation in OR  Informed Consent: I have reviewed the patients History and Physical, chart, labs and discussed the procedure including the risks, benefits and alternatives for the proposed anesthesia with the patient or authorized representative who has indicated his/her understanding and acceptance.     Dental advisory given  Plan Discussed with: CRNA  Anesthesia Plan Comments: (Magnet over AICD Has noticed some increased SOB and leg swelling since AICD readjustment/CV in January- will use clearsight )         Anesthesia Quick Evaluation

## 2024-05-11 ENCOUNTER — Encounter (HOSPITAL_COMMUNITY): Payer: Self-pay | Admitting: General Surgery

## 2024-05-11 ENCOUNTER — Encounter (HOSPITAL_COMMUNITY)
Admission: RE | Disposition: A | Discharge status: Home / Self Care | Payer: Self-pay | Source: Home / Self Care | Attending: General Surgery

## 2024-05-11 ENCOUNTER — Ambulatory Visit (HOSPITAL_COMMUNITY)
Admission: RE | Admit: 2024-05-11 | Discharge: 2024-05-11 | Disposition: A | Discharge status: Home / Self Care | Attending: General Surgery | Admitting: General Surgery

## 2024-05-11 ENCOUNTER — Ambulatory Visit (HOSPITAL_BASED_OUTPATIENT_CLINIC_OR_DEPARTMENT_OTHER): Payer: Self-pay | Admitting: Physician Assistant

## 2024-05-11 ENCOUNTER — Encounter (HOSPITAL_COMMUNITY): Payer: Self-pay | Admitting: Physician Assistant

## 2024-05-11 DIAGNOSIS — Z7901 Long term (current) use of anticoagulants: Secondary | ICD-10-CM | POA: Insufficient documentation

## 2024-05-11 DIAGNOSIS — K4020 Bilateral inguinal hernia, without obstruction or gangrene, not specified as recurrent: Secondary | ICD-10-CM | POA: Insufficient documentation

## 2024-05-11 DIAGNOSIS — I480 Paroxysmal atrial fibrillation: Secondary | ICD-10-CM

## 2024-05-11 DIAGNOSIS — I340 Nonrheumatic mitral (valve) insufficiency: Secondary | ICD-10-CM | POA: Diagnosis not present

## 2024-05-11 DIAGNOSIS — I509 Heart failure, unspecified: Secondary | ICD-10-CM

## 2024-05-11 DIAGNOSIS — I422 Other hypertrophic cardiomyopathy: Secondary | ICD-10-CM | POA: Insufficient documentation

## 2024-05-11 DIAGNOSIS — Z95810 Presence of automatic (implantable) cardiac defibrillator: Secondary | ICD-10-CM | POA: Insufficient documentation

## 2024-05-11 DIAGNOSIS — K4090 Unilateral inguinal hernia, without obstruction or gangrene, not specified as recurrent: Secondary | ICD-10-CM | POA: Diagnosis not present

## 2024-05-11 DIAGNOSIS — K219 Gastro-esophageal reflux disease without esophagitis: Secondary | ICD-10-CM | POA: Diagnosis not present

## 2024-05-11 DIAGNOSIS — Z87891 Personal history of nicotine dependence: Secondary | ICD-10-CM | POA: Insufficient documentation

## 2024-05-11 DIAGNOSIS — G4730 Sleep apnea, unspecified: Secondary | ICD-10-CM | POA: Insufficient documentation

## 2024-05-11 HISTORY — DX: Dyspnea, unspecified: R06.00

## 2024-05-11 HISTORY — PX: INGUINAL HERNIA REPAIR: SHX194

## 2024-05-11 LAB — POCT I-STAT, CHEM 8
BUN: 29 mg/dL — ABNORMAL HIGH (ref 8–23)
Calcium, Ion: 1.03 mmol/L — ABNORMAL LOW (ref 1.15–1.40)
Chloride: 108 mmol/L (ref 98–111)
Creatinine, Ser: 1.4 mg/dL — ABNORMAL HIGH (ref 0.61–1.24)
Glucose, Bld: 95 mg/dL (ref 70–99)
HCT: 39 % (ref 39.0–52.0)
Hemoglobin: 13.3 g/dL (ref 13.0–17.0)
Potassium: 4.9 mmol/L (ref 3.5–5.1)
Sodium: 137 mmol/L (ref 135–145)
TCO2: 22 mmol/L (ref 22–32)

## 2024-05-11 SURGERY — REPAIR, HERNIA, INGUINAL, BILATERAL, LAPAROSCOPIC
Anesthesia: General | Site: Groin | Laterality: Bilateral

## 2024-05-11 MED ORDER — AMISULPRIDE (ANTIEMETIC) 5 MG/2ML IV SOLN
10.0000 mg | Freq: Once | INTRAVENOUS | Status: AC | PRN
  Administered 2024-05-11: 10 mg via INTRAVENOUS

## 2024-05-11 MED ORDER — FENTANYL CITRATE (PF) 250 MCG/5ML IJ SOLN
INTRAMUSCULAR | Status: DC | PRN
  Administered 2024-05-11 (×2): 50 ug via INTRAVENOUS

## 2024-05-11 MED ORDER — HYDROMORPHONE HCL 1 MG/ML IJ SOLN
0.2500 mg | INTRAMUSCULAR | Status: DC | PRN
  Administered 2024-05-11: 0.25 mg via INTRAVENOUS

## 2024-05-11 MED ORDER — CHLORHEXIDINE GLUCONATE 0.12 % MT SOLN
OROMUCOSAL | Status: AC
  Administered 2024-05-11: 15 mL via OROMUCOSAL
  Filled 2024-05-11: qty 15

## 2024-05-11 MED ORDER — OXYCODONE HCL 5 MG PO TABS: 5.0000 mg | ORAL_TABLET | Freq: Once | ORAL | Status: DC | PRN

## 2024-05-11 MED ORDER — PROPOFOL 10 MG/ML IV BOLUS
INTRAVENOUS | Status: AC
  Filled 2024-05-11: qty 20

## 2024-05-11 MED ORDER — CEFAZOLIN SODIUM-DEXTROSE 2-4 GM/100ML-% IV SOLN
2.0000 g | INTRAVENOUS | Status: AC
  Administered 2024-05-11: 2 g via INTRAVENOUS

## 2024-05-11 MED ORDER — FENTANYL CITRATE (PF) 250 MCG/5ML IJ SOLN
INTRAMUSCULAR | Status: AC
  Filled 2024-05-11: qty 5

## 2024-05-11 MED ORDER — BUPIVACAINE HCL (PF) 0.25 % IJ SOLN
INTRAMUSCULAR | Status: DC | PRN
  Administered 2024-05-11: 8 mL

## 2024-05-11 MED ORDER — ENSURE PRE-SURGERY PO LIQD: 296.0000 mL | Freq: Once | ORAL | Status: DC

## 2024-05-11 MED ORDER — ORAL CARE MOUTH RINSE: 15.0000 mL | Freq: Once | OROMUCOSAL | Status: AC

## 2024-05-11 MED ORDER — ONDANSETRON HCL 4 MG/2ML IJ SOLN
INTRAMUSCULAR | Status: DC | PRN
  Administered 2024-05-11: 4 mg via INTRAVENOUS

## 2024-05-11 MED ORDER — ACETAMINOPHEN 500 MG PO TABS
ORAL_TABLET | ORAL | Status: AC
  Administered 2024-05-11: 1000 mg via ORAL
  Filled 2024-05-11: qty 2

## 2024-05-11 MED ORDER — CHLORHEXIDINE GLUCONATE 0.12 % MT SOLN: 15.0000 mL | Freq: Once | OROMUCOSAL | Status: AC

## 2024-05-11 MED ORDER — ROCURONIUM BROMIDE 10 MG/ML (PF) SYRINGE
PREFILLED_SYRINGE | INTRAVENOUS | Status: DC | PRN
  Administered 2024-05-11: 60 mg via INTRAVENOUS

## 2024-05-11 MED ORDER — PHENYLEPHRINE HCL-NACL 20-0.9 MG/250ML-% IV SOLN
INTRAVENOUS | Status: DC | PRN
  Administered 2024-05-11: 50 ug/min via INTRAVENOUS

## 2024-05-11 MED ORDER — SUGAMMADEX SODIUM 200 MG/2ML IV SOLN
INTRAVENOUS | Status: DC | PRN
  Administered 2024-05-11: 200 mg via INTRAVENOUS

## 2024-05-11 MED ORDER — CEFAZOLIN SODIUM-DEXTROSE 2-4 GM/100ML-% IV SOLN
INTRAVENOUS | Status: AC
Start: 2024-05-11 — End: 2024-05-11
  Filled 2024-05-11: qty 100

## 2024-05-11 MED ORDER — LACTATED RINGERS IV SOLN: INTRAVENOUS | Status: DC | PRN

## 2024-05-11 MED ORDER — BUPIVACAINE HCL (PF) 0.25 % IJ SOLN
INTRAMUSCULAR | Status: AC
  Filled 2024-05-11: qty 30

## 2024-05-11 MED ORDER — OXYCODONE HCL 5 MG/5ML PO SOLN: 5.0000 mg | Freq: Once | ORAL | Status: DC | PRN

## 2024-05-11 MED ORDER — ACETAMINOPHEN 500 MG PO TABS: 1000.0000 mg | ORAL_TABLET | ORAL | Status: AC

## 2024-05-11 MED ORDER — AMISULPRIDE (ANTIEMETIC) 5 MG/2ML IV SOLN
INTRAVENOUS | Status: AC
  Filled 2024-05-11: qty 2

## 2024-05-11 MED ORDER — TRAMADOL HCL 50 MG PO TABS: 50.0000 mg | ORAL_TABLET | Freq: Four times a day (QID) | ORAL | 0 refills | Status: DC | PRN

## 2024-05-11 MED ORDER — PROPOFOL 10 MG/ML IV BOLUS
INTRAVENOUS | Status: DC | PRN
  Administered 2024-05-11: 150 mg via INTRAVENOUS

## 2024-05-11 MED ORDER — LIDOCAINE 2% (20 MG/ML) 5 ML SYRINGE
INTRAMUSCULAR | Status: DC | PRN
  Administered 2024-05-11: 60 mg via INTRAVENOUS

## 2024-05-11 MED ORDER — HYDROMORPHONE HCL 1 MG/ML IJ SOLN
INTRAMUSCULAR | Status: AC
  Filled 2024-05-11: qty 1

## 2024-05-11 MED ORDER — CHLORHEXIDINE GLUCONATE CLOTH 2 % EX PADS: 6.0000 | MEDICATED_PAD | Freq: Once | CUTANEOUS | Status: DC

## 2024-05-11 MED ORDER — PHENYLEPHRINE 80 MCG/ML (10ML) SYRINGE FOR IV PUSH (FOR BLOOD PRESSURE SUPPORT)
PREFILLED_SYRINGE | INTRAVENOUS | Status: DC | PRN
  Administered 2024-05-11: 240 ug via INTRAVENOUS
  Administered 2024-05-11: 160 ug via INTRAVENOUS

## 2024-05-11 MED ORDER — DEXAMETHASONE SODIUM PHOSPHATE 10 MG/ML IJ SOLN
INTRAMUSCULAR | Status: DC | PRN
  Administered 2024-05-11: 10 mg via INTRAVENOUS

## 2024-05-11 MED ORDER — 0.9 % SODIUM CHLORIDE (POUR BTL) OPTIME
TOPICAL | Status: DC | PRN
  Administered 2024-05-11: 1000 mL

## 2024-05-11 MED ORDER — ACETAMINOPHEN 500 MG PO TABS: 1000.0000 mg | ORAL_TABLET | Freq: Once | ORAL | Status: DC

## 2024-05-11 MED ORDER — LACTATED RINGERS IV SOLN: INTRAVENOUS | Status: DC

## 2024-05-11 SURGICAL SUPPLY — 37 items
APPLIER CLIP LOGIC TI 5 (MISCELLANEOUS) IMPLANT
BAG COUNTER SPONGE SURGICOUNT (BAG) ×1 IMPLANT
CANISTER SUCTION 3000ML PPV (SUCTIONS) IMPLANT
CHLORAPREP W/TINT 26 (MISCELLANEOUS) ×1 IMPLANT
COVER SURGICAL LIGHT HANDLE (MISCELLANEOUS) ×1 IMPLANT
DERMABOND ADVANCED .7 DNX12 (GAUZE/BANDAGES/DRESSINGS) ×1 IMPLANT
DISSECTOR BLUNT TIP ENDO 5MM (MISCELLANEOUS) IMPLANT
DRAPE LAPAROSCOPIC ABDOMINAL (DRAPES) ×1 IMPLANT
ELECTRODE REM PT RTRN 9FT ADLT (ELECTROSURGICAL) ×1 IMPLANT
ENDOLOOP SUT PDS II 0 18 (SUTURE) IMPLANT
GLOVE BIO SURGEON STRL SZ7.5 (GLOVE) ×2 IMPLANT
GOWN STRL REUS W/ TWL LRG LVL3 (GOWN DISPOSABLE) ×2 IMPLANT
GOWN STRL REUS W/ TWL XL LVL3 (GOWN DISPOSABLE) ×1 IMPLANT
IRRIGATION SUCT STRKRFLW 2 WTP (MISCELLANEOUS) IMPLANT
KIT BASIN OR (CUSTOM PROCEDURE TRAY) ×1 IMPLANT
KIT TURNOVER KIT B (KITS) ×1 IMPLANT
MESH 3DMAX 5X7 LT XLRG (Mesh General) IMPLANT
MESH 3DMAX 5X7 RT XLRG (Mesh General) IMPLANT
NDL INSUFFLATION 14GA 120MM (NEEDLE) IMPLANT
NEEDLE INSUFFLATION 14GA 120MM (NEEDLE) IMPLANT
NS IRRIG 1000ML POUR BTL (IV SOLUTION) ×1 IMPLANT
PAD ARMBOARD POSITIONER FOAM (MISCELLANEOUS) ×2 IMPLANT
RELOAD STAPLE 4.0 BLU F/HERNIA (INSTRUMENTS) IMPLANT
SCISSORS LAP 5X35 DISP (ENDOMECHANICALS) ×1 IMPLANT
SET TUBE SMOKE EVAC HIGH FLOW (TUBING) ×1 IMPLANT
STAPLER HERNIA 12 8.5 360D (INSTRUMENTS) IMPLANT
SUT MNCRL AB 4-0 PS2 18 (SUTURE) ×1 IMPLANT
SUT VIC AB 1 CT1 27XBRD ANBCTR (SUTURE) IMPLANT
SUT VICRYL 0 UR6 27IN ABS (SUTURE) IMPLANT
TOWEL GREEN STERILE (TOWEL DISPOSABLE) ×1 IMPLANT
TOWEL GREEN STERILE FF (TOWEL DISPOSABLE) ×1 IMPLANT
TRAY LAPAROSCOPIC MC (CUSTOM PROCEDURE TRAY) ×1 IMPLANT
TROCAR OPTICAL SHORT 5MM (TROCAR) ×1 IMPLANT
TROCAR OPTICAL SLV SHORT 5MM (TROCAR) ×1 IMPLANT
TROCAR Z THREAD OPTICAL 12X100 (TROCAR) ×1 IMPLANT
WARMER LAPAROSCOPE (MISCELLANEOUS) ×1 IMPLANT
WATER STERILE IRR 1000ML POUR (IV SOLUTION) ×1 IMPLANT

## 2024-05-11 NOTE — Anesthesia Postprocedure Evaluation (Signed)
 Anesthesia Post Note  Patient: Joseph Morrow [facility]  Procedure(s) Performed: REPAIR, HERNIA, INGUINAL, BILATERAL, LAPAROSCOPIC (Bilateral: Groin)     Patient location during evaluation: PACU Anesthesia Type: General Level of consciousness: awake and alert, oriented and patient cooperative Pain management: pain level controlled Vital Signs Assessment: post-procedure vital signs reviewed and stable Respiratory status: spontaneous breathing, nonlabored ventilation and respiratory function stable Cardiovascular status: blood pressure returned to baseline and stable Postop Assessment: no apparent nausea or vomiting Anesthetic complications: no   No notable events documented.  Last Vitals:  Vitals:   05/11/24 1100 05/11/24 1115  BP: 111/77 121/77  Pulse: (!) 54 (!) 55  Resp: (!) 22 13  Temp:    SpO2: 95% 98%    Last Pain:  Vitals:   05/11/24 1100  TempSrc:   PainSc: 3                  Joseph Morrow

## 2024-05-11 NOTE — Anesthesia Procedure Notes (Signed)
 Procedure Name: Intubation Date/Time: 05/11/2024 10:04 AM  Performed by: Arvell Edsel HERO, CRNAPre-anesthesia Checklist: Emergency Drugs available, Patient identified, Suction available, Patient being monitored and Timeout performed Patient Re-evaluated:Patient Re-evaluated prior to induction Oxygen Delivery Method: Circle system utilized Preoxygenation: Pre-oxygenation with 100% oxygen Induction Type: IV induction Ventilation: Oral airway inserted - appropriate to patient size and Mask ventilation without difficulty Laryngoscope Size: Mac and 3 Grade View: Grade I Tube size: 7.0 mm Number of attempts: 1 Airway Equipment and Method: Patient positioned with wedge pillow and Stylet Placement Confirmation: ETT inserted through vocal cords under direct vision, positive ETCO2, CO2 detector and breath sounds checked- equal and bilateral Secured at: 23 cm Tube secured with: Tape

## 2024-05-11 NOTE — Op Note (Signed)
 05/11/2024  10:29 AM  PATIENT:  Joseph Morrow  78 y.o. male  PRE-OPERATIVE DIAGNOSIS:  BILATERAL INGUINAL HERNIA  POST-OPERATIVE DIAGNOSIS:  BILATERAL DIRECT INGUINAL HERNIA  PROCEDURE:  Procedure(s) with comments: REPAIR, HERNIA, INGUINAL, BILATERAL, LAPAROSCOPIC (Bilateral) - LAPAROSCOPIC BILATERAL INGUINAL HERNIA REPAIR WITH MESH  SURGEON:  Surgeons and Role:    DEWAINE Rubin Calamity, MD - Primary  ASSISTANTS: Waddell Collier, RNFA   ANESTHESIA:   local and general  EBL:  minimal   BLOOD ADMINISTERED:none  DRAINS: none   LOCAL MEDICATIONS USED:  BUPIVICAINE   SPECIMEN:  No Specimen  DISPOSITION OF SPECIMEN:  N/A  COUNTS:  YES  TOURNIQUET:  * No tourniquets in log *  DICTATION: .Dragon Dictation    Counts: reported as correct x 2  Findings:  The patient had a small right & left direct hernias  Indications for procedure:  The patient is a 78 year old male with a bilateral hernias for several months. Patient complained of symptomatology to his bilateral inguinal areas. The patient was taken back for elective inguinal hernia repair.  Details of the procedure:   The patient was taken back to the operating room. The patient was placed in supine position with bilateral SCDs in place.  The patient underwent GETA.  The patient was prepped and draped in the usual sterile fashion.  After appropriate anitbiotics were confirmed, a time-out was confirmed and all facts were verified.  0.25% Marcaine  was used to infiltrate the umbilical area. A 11-blade was used to cut down the skin and blunt dissection was used to get the anterior fashion.  The anterior fascia was incised approximately 1 cm and the muscles were retracted laterally. Blunt dissection was then used to create a space in the preperitoneal area. At this time a 10 mm camera was then introduced into the space and advanced the pubic tubercle and a 12 mm trocar was placed over this and insufflation was started.    At this time and space was created from medial to laterally the preperitoneal space on the right.  Cooper's ligament was initially cleaned off. A direct hernia was seen.  The preperitoneal fat was dissected away and the transversalis fascia spontaneously reduced.  The spermatic cord was identified and dissected away from the cremesteric muscle fibers.  This was circumferentially dissected. The hernia was seen in the indirect space. Dissection of the hernia sac was undertaken the vas deferens was identified and protected in all parts of the case.  The peritoneum was dissected back to approximate level of the umbilicus.  I turned my attention to the left side and a space was created from medial to laterally the preperitoneal space on the right.  Cooper's ligament was initially cleaned off. A direct hernia was seen.  The preperitoneal fat was dissected away and the transversalis fascia spontaneously reduced.  The spermatic cord was identified and dissected away from the cremesteric muscle fibers.  This was circumferentially dissected. The hernia was seen in the indirect space. Dissection of the hernia sac was undertaken the vas deferens was identified and protected in all parts of the case.  The peritoneum was dissected back to approximate level of the umbilicus.  A Bard 3D Max mesh, size: XLarge, was  introduced into the preperitoneal space.  The mesh was brought over to cover the direct and indirect and femoral hernia spaces.  This was anchored into place and secured to Cooper's ligament with 4.74mm staples from a Cov[place] hernia stapler. It was anchored to the anterior abdominal  wall with 4.8 mm staples. The hernia sac was seen lying posterior to the mesh. There was no staples placed laterally.   The insufflation was evacuated and the peritoneum was seen posterior to the mesh bilaterally. The trochars were removed. The anterior fascia was reapproximated using #1 Vicryl on a UR- 6.  Intra-abdominal air was  evacuated and the Veress needle removed. The skin was reapproximated using 4-0 Monocryl subcuticular fashion and was dressed with Dermabond. The  patient was awakened from general anesthesia and taken to recovery in stable condition.    PLAN OF CARE: Discharge to home after PACU  PATIENT DISPOSITION:  PACU - hemodynamically stable.   Delay start of Pharmacological VTE agent (>24hrs) due to surgical blood loss or risk of bleeding: not applicable

## 2024-05-11 NOTE — Interval H&P Note (Signed)
 History and Physical Interval Note:  05/11/2024 9:19 AM  Joseph Morrow  has presented today for surgery, with the diagnosis of BILATERAL INGUINAL HERNIA.  The various methods of treatment have been discussed with the patient and family. After consideration of risks, benefits and other options for treatment, the patient has consented to  Procedure(s) with comments: REPAIR, HERNIA, INGUINAL, BILATERAL, LAPAROSCOPIC (Bilateral) - LAPAROSCOPIC BILATERAL INGUINAL HERNIA REPAIR WITH MESH as a surgical intervention.  The patient's history has been reviewed, patient examined, no change in status, stable for surgery.  I have reviewed the patient's chart and labs.  Questions were answered to the patient's satisfaction.     Khaliyah Northrop

## 2024-05-11 NOTE — Discharge Instructions (Signed)

## 2024-05-11 NOTE — Transfer of Care (Signed)
 Immediate Anesthesia Transfer of Care Note  Patient: Joseph Morrow [facility]  Procedure(s) Performed: REPAIR, HERNIA, INGUINAL, BILATERAL, LAPAROSCOPIC (Bilateral: Groin)  Patient Location: PACU  Anesthesia Type:General  Level of Consciousness: awake, alert , oriented, and patient cooperative  Airway & Oxygen Therapy: Patient Spontanous Breathing and Patient connected to face mask oxygen  Post-op Assessment: Report given to RN, Post -op Vital signs reviewed and stable, Patient moving all extremities, Patient moving all extremities X 4, and Patient able to stick tongue midline  Post vital signs: Reviewed and stable  Last Vitals:  Vitals Value Taken Time  BP 144/88 05/11/24 10:40  Temp 36.8 C 05/11/24 10:40  Pulse 51 05/11/24 10:44  Resp 9 05/11/24 10:44  SpO2 92 % 05/11/24 10:44  Vitals shown include unfiled device data.  Last Pain:  Vitals:   05/11/24 0911  TempSrc:   PainSc: 0-No pain         Complications: No notable events documented.

## 2024-05-12 ENCOUNTER — Encounter (HOSPITAL_COMMUNITY): Payer: Self-pay | Admitting: General Surgery

## 2024-05-19 ENCOUNTER — Encounter: Payer: Self-pay | Admitting: Cardiology

## 2024-05-23 NOTE — Progress Notes (Unsigned)
 Cardiology Office Note:  .   Date:  05/23/2024  ID:  Joseph Morrow, DOB [birth year], MRN [id number] PCP: Joseph Morrow., MD  [facility] Providers Cardiologist:  None Electrophysiologist:  OLE ONEIDA HOLTS, MD {  History of Present Illness: .   Joseph Morrow is a 78 y.o. male w/PMHx of  HCM AFib, VT ICD   He last saw Dr. HOLTS 12/16/22, discussed hx of multiple ablation, the most recent Jan 2023, Intermittent AFib Pt suspected he was in Afib that visit w/complaints of fatigue . Device noted PAFib, none that day Continued amiodarone   Seeing Afib clinic a few times, as well as Jodie.  Has had more AFib over the year or so  Arrived to [facility] 09/09/23> device interrogation noted PAFib with episodes lasting ~ hour >> advised increased amiodarone  > DCCV cancelled  In Dr. Hiram review, felt he had been in persistent AFib that device was under-calling burden 2/2 under-sensed P waves and did recommend pursuing DCCV  09/16/23: TEE/DCCV  Severe bi atrial enlargement No LAA thrombus Mild MR EF 55% Mild RVE and hyupokinesis Mild TR Device leads in RA/RV Trivial PFO AV sclerosis  Successful DCCV  AFib clinic 7/23/5 2/2 device alert for AF, seems he was largely asymptomatic and unaware.  04/06/24: DCCV > SR  I saw him 04/25/24 He is doing well, no CP, palpitations or cardiac awareness Unclear how symptomatic he is with his AFib anymore, though mentions DOE with longer walks and carrying things, no rest SOB, no difficulties with ADLs No CP No dizziness, near syncope or syncope No bleeding or signs of bleeding He thinks his AFib of late maybe because he was not using his dental appliance for sleep apnea >> wearing it regularly since  Device check noted: Numerous AMS episodes All available EGMs are reviewed ALL AMS episodes triggered by a single AS event and no true AFnoted He conducts slowly ~489ms today,  No R waves pacing at 40 alone    When he is put into an AMS episode in review of his EGMs > triggers VP > often FF on the A channel causes asynchronous V pacing    Reviewed at length today with industry Mode switch trigger changed from AMS > AT/AF Given FF on the A channel, PVAB lengthened fro 70ms > 140   Note mode switch rate is at 140 (likely intentional) and left unchanged  Mychart/calls with some increased SOB after our visit >> not felt to be device programming related >> likely volume > lasix  helped >> rec BMET   05/11/24: b/l inguinal hernia surgery BMET this day was stable  Today's visit is scheduled to f/u post device programming change/AF burden ROS:   He feels well Recovered well from his hernia surgery  No CP, palpitations Has had no symptoms of AFib DOE did improve with lasix , still some DOE with hills Walking regularly with good exertional capacity No near syncope or syncope No bleeding/signs of bleeding   Device information SJM dual chamber ICD implanted 08/18/2013  Secondary prevention   Arrhythmia/AAD hx 09/18/21 Ablation left pulmonary veins were reisolated. The posterior wall was isolated. A perimitral flutter was also targeted. Typical flutter was also targeted   Amiodarone  VT started 2014 for VT > stopped ? When/why Sotalol  started 2018 for AFib Amiodarone  started Oct 2023  Studies Reviewed: SABRA    EKG not done today  DEVICE interrogation done today to review arrhythmia burden, and reviewed by myself Battery and auto lead measurements  are good No arrhythmias 73% AP >99% VP   09/18/21: EPS/ablation CONCLUSIONS: 1. Pulmonary vein isolation (wide antral circumferential lesion set) with ablation required to reisolate the left pulmonary veins 2. Posterior wall ablation and isolation  3. Ablation of atypical, perimitral atrial flutter 4. Ablation of typical atrial flutter 5. Intracardiac echocardiography reveals trivial pericardial effusion 6. No early apparent complications. 7.  Add Colchicine  0.6mg  PO BID x 5 days 8. Protonix  40mg  PO BID x 45 days  07/04/20: EPS/ablation CONCLUSIONS: 1. Sinus rhythm upon presentation.   2. Intracardiac echo reveals a moderate to large sized left atrium. 3. Successful electrical isolation and anatomical encircling of all four pulmonary veins with radiofrequency current. 4. Additional mapping and ablation within the left atrium due to persistence of atrial fibrillation with a posterior wall box demonstrated  5. Atrial fibrillation successfully cardioverted to sinus rhythm but quickly returned to afib with multiple cardioversion attempts made. 6. Cavo-tricuspid isthmus ablation performed due to transient typical atrial flutter observed today 7. At least 3 left atrial flutter circuits were observed today.  These were not stable for 3D mapping or ablation today 8. No early apparent complications.   Echo 08/23/19  1. Hypertrophic cardiomyopathy, sigmoid subtype. Basal septum measures 18 mm in maximal dimension. Flow acceleration noted at the LVOT, no LVOT obstruction noted. Chordal systolic anterior motion of the mitral valve without involvment of the anterior leaflet.   2. Left ventricular ejection fraction, by visual estimation, is 60 to  65%. The left ventricle has normal function. There is severely increased  left ventricular hypertrophy.   3. Elevated left atrial and left ventricular end-diastolic pressures.   4. Left ventricular diastolic parameters are consistent with Grade III  diastolic dysfunction (restrictive).   5. Global right ventricle has normal systolic function.The right  ventricular size is normal. No increase in right ventricular wall  thickness.   6. Left atrial size was moderately dilated.   7. Right atrial size was moderately dilated.   8. The mitral valve is normal in structure. Mild mitral valve  regurgitation. No evidence of mitral stenosis.   9. The tricuspid valve is normal in structure. Tricuspid valve   regurgitation is mild.  10. The aortic valve is tricuspid. Aortic valve regurgitation is not  visualized. No evidence of aortic valve sclerosis or stenosis.  11. The pulmonic valve was normal in structure. Pulmonic valve  regurgitation is mild.  12. Moderately elevated pulmonary artery systolic pressure.  13. The inferior vena cava is dilated in size with <50% respiratory  variability, suggesting right atrial pressure of 15 mmHg.  14. The left ventricle has no regional wall motion abnormalities.     Risk Assessment/Calculations:    Physical Exam:   VS:  There were no vitals taken for this visit.   Wt Readings from Last 3 Encounters:  05/11/24 170 lb (77.1 kg)  04/25/24 169 lb 6.4 oz (76.8 kg)  04/06/24 160 lb (72.6 kg)    GEN: Well nourished, well developed in no acute distress NECK: No JVD; No carotid bruits CARDIAC: RRR, no murmurs, rubs, gallops RESPIRATORY: CTA b/l without rales, wheezing or rhonchi  ABDOMEN: Soft, non-tender, non-distended EXTREMITIES: No edema; No deformity   ICD site: is stable, no thinning, fluctuation, tethering  ASSESSMENT AND PLAN: .    persistent AFib Atrial flutter CHA2DS2Vasc is 4, on Eliquis , appropriately dosed Zero burden Amiodarone  Labs are UTD  He mentions that he thinks AFib perhaps was triggered by untreated apnea Faithfully using his dental appliance now  HCM VT ICD Secondary prevention device intact function no programming changes made CorVue suggests volume (appears to wobble up/down) He uses lasix /K+ PRN, admits to some salty/dietary indiscretions > advised 2-3 days of lasix  Minimize sodium   Secondary hypercoagulable state 2/2 AFib   Dispo: back in 55mo sooner if needed  Signed, Charlies Macario Arthur, PA-C

## 2024-05-25 ENCOUNTER — Ambulatory Visit: Attending: Physician Assistant | Admitting: Physician Assistant

## 2024-05-25 VITALS — BP 112/62 | HR 81 | Ht 70.0 in | Wt 172.0 lb

## 2024-05-25 DIAGNOSIS — I4819 Other persistent atrial fibrillation: Secondary | ICD-10-CM | POA: Insufficient documentation

## 2024-05-25 DIAGNOSIS — I4892 Unspecified atrial flutter: Secondary | ICD-10-CM | POA: Diagnosis not present

## 2024-05-25 DIAGNOSIS — Z95810 Presence of automatic (implantable) cardiac defibrillator: Secondary | ICD-10-CM | POA: Insufficient documentation

## 2024-05-25 DIAGNOSIS — I472 Ventricular tachycardia, unspecified: Secondary | ICD-10-CM | POA: Diagnosis not present

## 2024-05-25 DIAGNOSIS — I422 Other hypertrophic cardiomyopathy: Secondary | ICD-10-CM | POA: Diagnosis present

## 2024-05-25 DIAGNOSIS — D6869 Other thrombophilia: Secondary | ICD-10-CM | POA: Diagnosis not present

## 2024-05-25 LAB — CUP PACEART INCLINIC DEVICE CHECK
Battery Remaining Longevity: 24 mo
Brady Statistic RA Percent Paced: 73 %
Brady Statistic RV Percent Paced: 99.94 %
Date Time Interrogation Session: 20250911120743
HighPow Impedance: 42.955
Implantable Lead Connection Status: 753985
Implantable Lead Connection Status: 753985
Implantable Lead Implant Date: 20141205
Implantable Lead Implant Date: 20141205
Implantable Lead Location: 753859
Implantable Lead Location: 753860
Implantable Lead Model: 7121
Implantable Pulse Generator Implant Date: 20171221
Lead Channel Impedance Value: 375 Ohm
Lead Channel Impedance Value: 462.5 Ohm
Lead Channel Pacing Threshold Amplitude: 1 V
Lead Channel Pacing Threshold Pulse Width: 0.5 ms
Lead Channel Sensing Intrinsic Amplitude: 12 mV
Lead Channel Sensing Intrinsic Amplitude: 2.6 mV
Lead Channel Setting Pacing Amplitude: 2 V
Lead Channel Setting Pacing Amplitude: 2.5 V
Lead Channel Setting Pacing Pulse Width: 0.5 ms
Lead Channel Setting Sensing Sensitivity: 0.5 mV
Pulse Gen Serial Number: 7387049
Zone Setting Status: 755011

## 2024-05-25 NOTE — Patient Instructions (Signed)
 Medication Instructions:    Your physician recommends that you continue on your current medications as directed. Please refer to the Current Medication list given to you today.   *If you need a refill on your cardiac medications before your next appointment, please call your pharmacy*   Lab Work: NONE ORDERED  TODAY    If you have labs (blood work) drawn today and your tests are completely normal, you will receive your results only by: MyChart Message (if you have MyChart) OR A paper copy in the mail If you have any lab test that is abnormal or we need to change your treatment, we will call you to review the results.   Testing/Procedures: NONE ORDERED  TODAY     Follow-Up: At [facility], you and your health needs are our priority.  As part of our continuing mission to provide you with exceptional heart care, our providers are all part of one team.  This team includes your primary Cardiologist (physician) and Advanced Practice Providers or APPs (Physician Assistants and Nurse Practitioners) who all work together to provide you with the care you need, when you need it.  Your next appointment:    4 month(s) Provider:  You may see OLE ONEIDA HOLTS, MD or Charlies Arthur, PA-C ( CONTACT  CASSIE HALL/ ANGELINE HAMMER FOR EP SCHEDULING ISSUES )   We recommend signing up for the patient portal called MyChart.  Sign up information is provided on this After Visit Summary.  MyChart is used to connect with patients for Virtual Visits (Telemedicine).  Patients are able to view lab/test results, encounter notes, upcoming appointments, etc.  Non-urgent messages can be sent to your provider as well.   To learn more about what you can do with MyChart, go to [email or web address].   Other Instructions

## 2024-05-26 ENCOUNTER — Ambulatory Visit: Payer: Self-pay | Admitting: Cardiology

## 2024-05-29 DIAGNOSIS — M5459 Other low back pain: Secondary | ICD-10-CM | POA: Diagnosis not present

## 2024-05-29 DIAGNOSIS — R293 Abnormal posture: Secondary | ICD-10-CM | POA: Diagnosis not present

## 2024-05-29 DIAGNOSIS — M9901 Segmental and somatic dysfunction of cervical region: Secondary | ICD-10-CM | POA: Diagnosis not present

## 2024-05-29 DIAGNOSIS — M791 Myalgia, unspecified site: Secondary | ICD-10-CM | POA: Diagnosis not present

## 2024-05-29 DIAGNOSIS — M5137 Other intervertebral disc degeneration, lumbosacral region with discogenic back pain only: Secondary | ICD-10-CM | POA: Diagnosis not present

## 2024-05-29 DIAGNOSIS — M50222 Other cervical disc displacement at C5-C6 level: Secondary | ICD-10-CM | POA: Diagnosis not present

## 2024-05-29 DIAGNOSIS — M9902 Segmental and somatic dysfunction of thoracic region: Secondary | ICD-10-CM | POA: Diagnosis not present

## 2024-05-29 DIAGNOSIS — M542 Cervicalgia: Secondary | ICD-10-CM | POA: Diagnosis not present

## 2024-05-29 DIAGNOSIS — M9903 Segmental and somatic dysfunction of lumbar region: Secondary | ICD-10-CM | POA: Diagnosis not present

## 2024-06-01 ENCOUNTER — Other Ambulatory Visit: Payer: Self-pay | Admitting: Cardiology

## 2024-06-01 DIAGNOSIS — I480 Paroxysmal atrial fibrillation: Secondary | ICD-10-CM

## 2024-06-01 NOTE — Telephone Encounter (Signed)
 Prescription refill request for Eliquis  received. Indication:afib Last office visit:9/25 Scr:1.40  8/25 Age: 78 Weight:78  kg  Prescription refilled

## 2024-06-02 ENCOUNTER — Ambulatory Visit (INDEPENDENT_AMBULATORY_CARE_PROVIDER_SITE_OTHER): Payer: Medicare Other

## 2024-06-02 DIAGNOSIS — I472 Ventricular tachycardia, unspecified: Secondary | ICD-10-CM | POA: Diagnosis not present

## 2024-06-02 LAB — CUP PACEART REMOTE DEVICE CHECK
Battery Remaining Longevity: 24 mo
Battery Remaining Percentage: 27 %
Battery Voltage: 2.87 V
Brady Statistic AP VP Percent: 81 %
Brady Statistic AP VS Percent: 1 %
Brady Statistic AS VP Percent: 19 %
Brady Statistic AS VS Percent: 1 %
Brady Statistic RA Percent Paced: 81 %
Brady Statistic RV Percent Paced: 99 %
Date Time Interrogation Session: 20250919020018
HighPow Impedance: 41 Ohm
HighPow Impedance: 42 Ohm
Implantable Lead Connection Status: 753985
Implantable Lead Connection Status: 753985
Implantable Lead Implant Date: 20141205
Implantable Lead Implant Date: 20141205
Implantable Lead Location: 753859
Implantable Lead Location: 753860
Implantable Lead Model: 7121
Implantable Pulse Generator Implant Date: 20171221
Lead Channel Impedance Value: 400 Ohm
Lead Channel Impedance Value: 460 Ohm
Lead Channel Pacing Threshold Amplitude: 0.75 V
Lead Channel Pacing Threshold Amplitude: 0.875 V
Lead Channel Pacing Threshold Pulse Width: 0.5 ms
Lead Channel Pacing Threshold Pulse Width: 0.5 ms
Lead Channel Sensing Intrinsic Amplitude: 12 mV
Lead Channel Sensing Intrinsic Amplitude: 2.9 mV
Lead Channel Setting Pacing Amplitude: 1.875
Lead Channel Setting Pacing Amplitude: 2.5 V
Lead Channel Setting Pacing Pulse Width: 0.5 ms
Lead Channel Setting Sensing Sensitivity: 0.5 mV
Pulse Gen Serial Number: 7387049
Zone Setting Status: 755011

## 2024-06-06 NOTE — Progress Notes (Signed)
Remote ICD Transmission.

## 2024-06-07 ENCOUNTER — Telehealth (HOSPITAL_COMMUNITY): Payer: Self-pay | Admitting: *Deleted

## 2024-06-07 ENCOUNTER — Other Ambulatory Visit: Payer: Self-pay | Admitting: Cardiology

## 2024-06-07 ENCOUNTER — Ambulatory Visit: Payer: Self-pay | Admitting: Cardiology

## 2024-06-07 DIAGNOSIS — I480 Paroxysmal atrial fibrillation: Secondary | ICD-10-CM

## 2024-06-07 NOTE — Telephone Encounter (Signed)
 Patient called device clinic and left message on VM.  Having some symptoms, thinks he is back in AF.  Sent transmission this am for someone to review and call him back please.

## 2024-06-07 NOTE — Telephone Encounter (Signed)
 Pt called reporting possible Afib and wanted to send a transmission from his device. I will forward to Device Clinic to evaluate.

## 2024-06-07 NOTE — Telephone Encounter (Signed)
 Reviewed patient's transmission:  Normal Device Function Presenting AP/VP 53-58bpm.  No episodes AF burden 0% No recent history of AF events.   CORVUE shows trend down, suspicious for fluid retention, with signs of recent fluid retention in last few weeks.   Spoke with patient.  Complains of noticeable SOB with exertion in recent days. Weights daily - feels his weight has stabilized He has been taking prn Lasix  for the past week up until yesterday.   He is feeling better today.  Discussed continue to weight self daily, take Lasix  as needed and to call us  back if symptoms do not continue to improve.   Will forward to Dr. Cindie in case anything further.  Patient is not followed by gen cards.

## 2024-06-09 ENCOUNTER — Other Ambulatory Visit (HOSPITAL_COMMUNITY): Payer: Self-pay | Admitting: Physician Assistant

## 2024-06-12 ENCOUNTER — Encounter: Payer: Self-pay | Admitting: Physician Assistant

## 2024-06-12 ENCOUNTER — Ambulatory Visit: Attending: Physician Assistant | Admitting: Physician Assistant

## 2024-06-12 VITALS — BP 112/72 | HR 54 | Ht 70.0 in | Wt 167.0 lb

## 2024-06-12 DIAGNOSIS — Z95810 Presence of automatic (implantable) cardiac defibrillator: Secondary | ICD-10-CM | POA: Insufficient documentation

## 2024-06-12 DIAGNOSIS — I422 Other hypertrophic cardiomyopathy: Secondary | ICD-10-CM | POA: Insufficient documentation

## 2024-06-12 DIAGNOSIS — Z79899 Other long term (current) drug therapy: Secondary | ICD-10-CM | POA: Diagnosis present

## 2024-06-12 DIAGNOSIS — I472 Ventricular tachycardia, unspecified: Secondary | ICD-10-CM | POA: Insufficient documentation

## 2024-06-12 DIAGNOSIS — I480 Paroxysmal atrial fibrillation: Secondary | ICD-10-CM | POA: Diagnosis not present

## 2024-06-12 DIAGNOSIS — I4819 Other persistent atrial fibrillation: Secondary | ICD-10-CM | POA: Insufficient documentation

## 2024-06-12 DIAGNOSIS — D6869 Other thrombophilia: Secondary | ICD-10-CM | POA: Diagnosis present

## 2024-06-12 DIAGNOSIS — I4892 Unspecified atrial flutter: Secondary | ICD-10-CM | POA: Diagnosis present

## 2024-06-12 DIAGNOSIS — I4891 Unspecified atrial fibrillation: Secondary | ICD-10-CM | POA: Diagnosis present

## 2024-06-12 DIAGNOSIS — I484 Atypical atrial flutter: Secondary | ICD-10-CM | POA: Insufficient documentation

## 2024-06-12 NOTE — Patient Instructions (Addendum)
 Thank you for choosing [facility]!     Medication Instructions:  Follow up with EP in 1 to 2 weeks.  No medication changes were made during today's visit.   *If you need a refill on your cardiac medications before your next appointment, please call your pharmacy*   Lab Work: No labs were ordered during today's visit.  If you have labs (blood work) drawn today and your tests are completely normal, you will receive your results only by: MyChart Message (if you have MyChart) OR A paper copy in the mail If you have any lab test that is abnormal or we need to change your treatment, we will call you to review the results.   Testing/Procedures: Your physician has requested that you have an echocardiogram. Echocardiography is a painless test that uses sound waves to create images of your heart. It provides your doctor with information about the size and shape of your heart and how well your heart's chambers and valves are working. This procedure takes approximately one hour. There are no restrictions for this procedure. Please do NOT wear cologne, perfume, aftershave, or lotions (deodorant is allowed). Please arrive 15 minutes prior to your appointment time.  Please note: We ask at that you not bring children with you during ultrasound (echo/ vascular) testing. Due to room size and safety concerns, children are not allowed in the ultrasound rooms during exams. Our front office staff cannot provide observation of children in our lobby area while testing is being conducted. An adult accompanying a patient to their appointment will only be allowed in the ultrasound room at the discretion of the ultrasound technician under special circumstances. We apologize for any inconvenience.   Your next appointment:   3 month(s)   Provider:   Dr. Jeffrie or Orren Fabry     Follow-Up: At [facility], you and your health needs are our priority.  As part of our continuing mission to  provide you with exceptional heart care, we have created designated Provider Care Teams.  These Care Teams include your primary Cardiologist (physician) and Advanced Practice Providers (APPs -  Physician Assistants and Nurse Practitioners) who all work together to provide you with the care you need, when you need it. We recommend signing up for the patient portal called MyChart.  Sign up information is provided on this After Visit Summary.  MyChart is used to connect with patients for Virtual Visits (Telemedicine).  Patients are able to view lab/test results, encounter notes, upcoming appointments, etc.  Non-urgent messages can be sent to your provider as well.   To learn more about what you can do with MyChart, go to [email or web address].

## 2024-06-12 NOTE — Progress Notes (Signed)
 Cardiology Office Note   Date:  06/12/2024  ID:  Dawsen, Krieger Feb 01, 1946, MRN [id number] PCP: Loreli Elsie JONETTA Mickey., MD  [facility] Providers Cardiologist:  None Electrophysiologist:  OLE ONEIDA HOLTS, MD   History of Present Illness Akshat Minehart is a 78 y.o. male with a past medical history of HTN, atrial fibrillation, VTE, and ICD implanted.   He presents today with a history of hypertrophic cardiomyopathy with worsening shortness of breath and fluid retention.  He experiences worsening shortness of breath and fluid retention after recent pacemaker setting adjustments. Despite taking Lasix  20 mg daily, he has significant ankle swelling and shortness of breath with minimal exertion. Previously, he could walk long distances without difficulty, but now he becomes short of breath after a short distance. Muscle aches accompany his shortness of breath.  He has hypertrophic cardiomyopathy with a thickened septum and a history of ventricular tachycardia, leading to ICD placement. He recalls an enlarged portion of his heart and a murmur since childhood. His last echocardiogram was approximately five years ago.  No recent changes in diet, exercise, or other medications.  Dr. Almetta came in the room and interrogated his ICD.  He has a [employer] dual-chamber ICD that was pacing 100% of the time.  He made some adjustments so that the patient is now operating on his intrinsic rhythm, 50 bpm.  We hope to improve his NYHA class II heart failure symptoms.  Reports no chest pain, pressure, or tightness. No orthopnea, PND. Reports no palpitations.   Discussed the use of AI scribe software for clinical note transcription with the patient, who gave verbal consent to proceed.   ROS: Pertinent ROS in HPI  Studies Reviewed      Echo 08/23/19  1. Hypertrophic cardiomyopathy, sigmoid subtype. Basal septum measures 18 mm in maximal dimension. Flow acceleration  noted at the LVOT, no LVOT obstruction noted. Chordal systolic anterior motion of the mitral valve without involvment of the anterior leaflet.   2. Left ventricular ejection fraction, by visual estimation, is 60 to  65%. The left ventricle has normal function. There is severely increased  left ventricular hypertrophy.   3. Elevated left atrial and left ventricular end-diastolic pressures.   4. Left ventricular diastolic parameters are consistent with Grade III  diastolic dysfunction (restrictive).   5. Global right ventricle has normal systolic function.The right  ventricular size is normal. No increase in right ventricular wall  thickness.   6. Left atrial size was moderately dilated.   7. Right atrial size was moderately dilated.   8. The mitral valve is normal in structure. Mild mitral valve  regurgitation. No evidence of mitral stenosis.   9. The tricuspid valve is normal in structure. Tricuspid valve  regurgitation is mild.  10. The aortic valve is tricuspid. Aortic valve regurgitation is not  visualized. No evidence of aortic valve sclerosis or stenosis.  11. The pulmonic valve was normal in structure. Pulmonic valve  regurgitation is mild.  12. Moderately elevated pulmonary artery systolic pressure.  13. The inferior vena cava is dilated in size with <50% respiratory  variability, suggesting right atrial pressure of 15 mmHg.  14. The left ventricle has no regional wall motion abnormalities.       Physical Exam VS:  BP 112/72   Pulse (!) 54   Ht 5' 10 (1.778 m)   Wt 167 lb (75.8 kg)   SpO2 93%   BMI 23.96 kg/m  Wt Readings from Last 3 Encounters:  06/12/24 167 lb (75.8 kg)  05/25/24 172 lb (78 kg)  05/11/24 170 lb (77.1 kg)    GEN: Well nourished, well developed in no acute distress NECK: No JVD; No carotid bruits CARDIAC: RRR, no murmurs, rubs, gallops RESPIRATORY:  Clear to auscultation without rales, wheezing or rhonchi  ABDOMEN: Soft, non-tender,  non-distended EXTREMITIES:  No edema; No deformity   ASSESSMENT AND PLAN  HCM (sigmoid type nonobstructive) Worsening dyspnea and fluid retention post pacemaker adjustment. Fluid retention possibly linked to pacemaker settings. Renal function compromised with creatinine 1.4-1.7, requiring cautious diuretic use. - Interrogate pacemaker to assess current settings and function. - Continue Lasix  20 mg daily - Order echocardiogram to assess heart function and structure. - Last echocardiogram listed above with LVEF 65%, grade 3 diastolic dysfunction  Status post dual chamber ICD placement for ventricular tachycardia ICD in place for ventricular tachycardia. Current settings result in 100% pacing, potentially weakening heart function. Adjustments made to reduce 100% pacing. - Interrogate pacemaker and adjust settings to reduce 100% pacing. - Monitor symptoms and reassess in a few weeks with EP.  Atrial fibrillation and atrial flutter No recent episodes.  No A-fib burden, continue amiodarone  - Currently on Eliquis  for anticoagulation, CHA2DS2-VASc score of 4 - No recent bleeding on anticoagulation      Dispo: He can follow-up in 1 to 2 weeks with EP and 3 months with general cardiology  Signed, Orren LOISE Fabry, PA-C

## 2024-06-16 ENCOUNTER — Telehealth: Payer: Self-pay | Admitting: Student in an Organized Health Care Education/Training Program

## 2024-06-16 NOTE — Telephone Encounter (Signed)
 Spoke to I'm regarding his recent device. On 06/02/24 remote check AP 81% and VP 19%. SJM DC ICD for 2' prevention. DDDR 50, 160/190. Prior interrogations with higher VP percentage, >80%. He was recently seen on 05/25/24 by Charlies Arthur in clinic. He has hx of multiple atrial arrhythmias and has had issue with atrial undersensing those arrhythmias. Multiple AMS episodes on 04/25/24 interrogation and all episodes triggered by single AS event without true AF. He was conducting very slowly then with PR 400 ms and no R waves at VVI 40. He was having asynchronous V pacing with AMS episodes that were triggering VP. Several changes were made, see below:   0812/25 DEVICE interrogation done today and reviewed by myself Battery and lead measurements are good Numerous AMS episodes All available EGMs are reviewed ALL AMS episodes triggered by a single AS event and no true AFnoted He conducts slowly ~473ms today,  No R waves pacing at 40 alone   When he is put into an AMS episode in review of his EGMs > triggers VP > often FF on the A channel causes asynchronous V pacing    Reviewed at length today with industry Mode switch trigger changed from AMS > AT/AF Given FF on the A channel, PVAB lengthened fro 70ms > 140   Note mode switch rate is at 140 (likely intentional) and left unchanged  I saw him back on 06/12/24 when he was in gen cards clinic with HF sx. Prolonged his AV delays and set backup rate lower to 45. His intrinsic is 48-55 at rest. Called him today and he feels much less SOB not VP 100%. He has clinic f/u on Thursday with Brandi to see how he is doing and if we need to rethink his device settings again. For now I would rather him not VP and leave his AV delays prolonged.   Donnice DELENA Primus, MD [facility]  Cardiac Electrophysiology

## 2024-06-22 ENCOUNTER — Ambulatory Visit: Admitting: Pulmonary Disease

## 2024-06-26 DIAGNOSIS — M5137 Other intervertebral disc degeneration, lumbosacral region with discogenic back pain only: Secondary | ICD-10-CM | POA: Diagnosis not present

## 2024-06-26 DIAGNOSIS — M9903 Segmental and somatic dysfunction of lumbar region: Secondary | ICD-10-CM | POA: Diagnosis not present

## 2024-06-26 DIAGNOSIS — M9901 Segmental and somatic dysfunction of cervical region: Secondary | ICD-10-CM | POA: Diagnosis not present

## 2024-06-26 DIAGNOSIS — M5459 Other low back pain: Secondary | ICD-10-CM | POA: Diagnosis not present

## 2024-06-26 DIAGNOSIS — M9902 Segmental and somatic dysfunction of thoracic region: Secondary | ICD-10-CM | POA: Diagnosis not present

## 2024-06-26 DIAGNOSIS — R293 Abnormal posture: Secondary | ICD-10-CM | POA: Diagnosis not present

## 2024-06-26 DIAGNOSIS — M50222 Other cervical disc displacement at C5-C6 level: Secondary | ICD-10-CM | POA: Diagnosis not present

## 2024-06-26 DIAGNOSIS — M791 Myalgia, unspecified site: Secondary | ICD-10-CM | POA: Diagnosis not present

## 2024-06-26 DIAGNOSIS — M542 Cervicalgia: Secondary | ICD-10-CM | POA: Diagnosis not present

## 2024-06-28 NOTE — Progress Notes (Unsigned)
  Electrophysiology Office Note:   ID:  Joseph Morrow, DOB Feb 05, 1946, MRN [id number]  Primary Cardiologist: None Electrophysiologist: OLE ONEIDA HOLTS, MD  {Click to update primary MD,subspecialty MD or APP then REFRESH:1}    History of Present Illness:   Joseph Morrow is a 78 y.o. male with h/o hypertrophic cardiomyopathy, s/p ICD for VT, atrial fibrillation. Hospitalized for sotalol  administration 02/2017. S/p afib ablation 07/04/20 and 09/18/21 seen today for routine electrophysiology followup.   On 06/02/24 remote check AP 81% and VP 19%. SJM DC ICD for 2' prevention. DDDR 50, 160/190. Prior interrogations with higher VP percentage, >80%. He was recently seen on 05/25/24 by Charlies Arthur in clinic. He has hx of multiple atrial arrhythmias and has had issue with atrial undersensing those arrhythmias. Multiple AMS episodes on 04/25/24 interrogation and all episodes triggered by single AS event without true AF. He was conducting very slowly then with PR 400 ms and no R waves at VVI 40. He was having asynchronous V pacing with AMS episodes that were triggering VP. Several changes were made, see below:   When he is put into an AMS episode in review of his EGMs > triggers VP > often FF on the A channel causes asynchronous V pacing    Reviewed at length today with industry Mode switch trigger changed from AMS > AT/AF Given FF on the A channel, PVAB lengthened fro 70ms > 140   Note mode switch rate is at 140 (likely intentional) and left unchanged  Seen by Dr. Almetta 06/12/2024.  Prolonged his AV delays and set backup rate lower to 45. His intrinsic is 48-55 at rest. Called him and he felt much less SOB not VP 100%.  For now, would rather him not VP and leave his AV delays prolonged.    Since last being seen in our clinic the patient reports doing ***.  he denies chest pain, palpitations, dyspnea, PND, orthopnea, nausea, vomiting, dizziness, syncope, edema, weight gain, or  early satiety.   Review of systems complete and found to be negative unless listed in HPI.   EP Information / Studies Reviewed:    {EKGtoday:28818}       ICD Interrogation-  reviewed in detail today,  See PACEART report.  Arrhythmia/Device History SJM dual chamber ICD implanted 08/18/2013 -> gen change 2017  S/p PVI + CTI 09/18/2021   Physical Exam:   VS:  There were no vitals taken for this visit.   Wt Readings from Last 3 Encounters:  06/12/24 167 lb (75.8 kg)  05/25/24 172 lb (78 kg)  05/11/24 170 lb (77.1 kg)     GEN: No acute distress *** NECK: No JVD; No carotid bruits CARDIAC: {EPRHYTHM:28826}, no murmurs, rubs, gallops RESPIRATORY:  Clear to auscultation without rales, wheezing or rhonchi  ABDOMEN: Soft, non-tender, non-distended EXTREMITIES:  {EDEMA LEVEL:28147::No} edema; No deformity   ASSESSMENT AND PLAN:    HCM  s/p Abbott dual chamber ICD  euvolemic today Stable on an appropriate medical regimen Normal ICD function See Pace Art report No changes today  VT Quiescent on amio  Paroxysmal AF S/p ablation 06/2020 and 09/2021 Continue amiodarone  200 mg daily Continue eliquis  5 mg BID for CHA2DS2/VASc of at least 3.       Disposition:   Follow up with {EPPROVIDERS:28135::EP Team} {EPFOLLOW UP:28173}   Signed, Ozell Prentice Passey, PA-C

## 2024-06-29 ENCOUNTER — Ambulatory Visit: Attending: Student | Admitting: Student

## 2024-06-29 ENCOUNTER — Encounter: Payer: Self-pay | Admitting: Student

## 2024-06-29 VITALS — BP 102/56 | HR 49 | Ht 70.0 in | Wt 167.8 lb

## 2024-06-29 DIAGNOSIS — I480 Paroxysmal atrial fibrillation: Secondary | ICD-10-CM | POA: Insufficient documentation

## 2024-06-29 DIAGNOSIS — Z5181 Encounter for therapeutic drug level monitoring: Secondary | ICD-10-CM | POA: Insufficient documentation

## 2024-06-29 DIAGNOSIS — I472 Ventricular tachycardia, unspecified: Secondary | ICD-10-CM | POA: Diagnosis not present

## 2024-06-29 DIAGNOSIS — Z79899 Other long term (current) drug therapy: Secondary | ICD-10-CM | POA: Insufficient documentation

## 2024-06-29 DIAGNOSIS — D6869 Other thrombophilia: Secondary | ICD-10-CM | POA: Insufficient documentation

## 2024-06-29 DIAGNOSIS — I422 Other hypertrophic cardiomyopathy: Secondary | ICD-10-CM | POA: Insufficient documentation

## 2024-06-29 NOTE — Patient Instructions (Signed)
 Medication Instructions:  Your physician recommends that you continue on your current medications as directed. Please refer to the Current Medication list given to you today.  *If you need a refill on your cardiac medications before your next appointment, please call your pharmacy*  Lab Work: CMET, TSH, FreeT4-TODAY If you have labs (blood work) drawn today and your tests are completely normal, you will receive your results only by: MyChart Message (if you have MyChart) OR A paper copy in the mail If you have any lab test that is abnormal or we need to change your treatment, we will call you to review the results.  Follow-Up: At [facility], you and your health needs are our priority.  As part of our continuing mission to provide you with exceptional heart care, our providers are all part of one team.  This team includes your primary Cardiologist (physician) and Advanced Practice Providers or APPs (Physician Assistants and Nurse Practitioners) who all work together to provide you with the care you need, when you need it.  Your next appointment:   6 month(s)  Provider:   Ole Holts, MD or APP

## 2024-06-30 ENCOUNTER — Ambulatory Visit: Payer: Self-pay | Admitting: Student

## 2024-06-30 LAB — COMPREHENSIVE METABOLIC PANEL WITH GFR
ALT: 44 IU/L (ref 0–44)
AST: 41 IU/L — ABNORMAL HIGH (ref 0–40)
Albumin: 4 g/dL (ref 3.8–4.8)
Alkaline Phosphatase: 114 IU/L (ref 47–123)
BUN/Creatinine Ratio: 21 (ref 10–24)
BUN: 28 mg/dL — ABNORMAL HIGH (ref 8–27)
Bilirubin Total: 0.4 mg/dL (ref 0.0–1.2)
CO2: 25 mmol/L (ref 20–29)
Calcium: 9.7 mg/dL (ref 8.6–10.2)
Chloride: 103 mmol/L (ref 96–106)
Creatinine, Ser: 1.34 mg/dL — ABNORMAL HIGH (ref 0.76–1.27)
Globulin, Total: 2.8 g/dL (ref 1.5–4.5)
Glucose: 68 mg/dL — ABNORMAL LOW (ref 70–99)
Potassium: 4.9 mmol/L (ref 3.5–5.2)
Sodium: 142 mmol/L (ref 134–144)
Total Protein: 6.8 g/dL (ref 6.0–8.5)
eGFR: 54 mL/min/1.73 — ABNORMAL LOW (ref >59)

## 2024-06-30 LAB — TSH: TSH: 2.52 u[IU]/mL (ref 0.450–4.500)

## 2024-06-30 LAB — T4, FREE: Free T4: 1.4 ng/dL (ref 0.82–1.77)

## 2024-07-08 ENCOUNTER — Other Ambulatory Visit (HOSPITAL_COMMUNITY): Payer: Self-pay | Admitting: Physician Assistant

## 2024-07-18 ENCOUNTER — Ambulatory Visit (HOSPITAL_COMMUNITY)
Admission: RE | Admit: 2024-07-18 | Discharge: 2024-07-18 | Disposition: A | Discharge status: Home / Self Care | Source: Ambulatory Visit | Attending: Physician Assistant | Admitting: Physician Assistant

## 2024-07-18 DIAGNOSIS — I422 Other hypertrophic cardiomyopathy: Secondary | ICD-10-CM | POA: Insufficient documentation

## 2024-07-18 LAB — ECHOCARDIOGRAM COMPLETE
MV M vel: 4.31 m/s
MV Peak grad: 74.3 mmHg
S' Lateral: 2.5 cm

## 2024-07-19 DIAGNOSIS — Z23 Encounter for immunization: Secondary | ICD-10-CM | POA: Diagnosis not present

## 2024-07-22 ENCOUNTER — Other Ambulatory Visit (HOSPITAL_COMMUNITY): Payer: Self-pay | Admitting: Physician Assistant

## 2024-07-24 DIAGNOSIS — M9902 Segmental and somatic dysfunction of thoracic region: Secondary | ICD-10-CM | POA: Diagnosis not present

## 2024-07-24 DIAGNOSIS — R293 Abnormal posture: Secondary | ICD-10-CM | POA: Diagnosis not present

## 2024-07-24 DIAGNOSIS — M5137 Other intervertebral disc degeneration, lumbosacral region with discogenic back pain only: Secondary | ICD-10-CM | POA: Diagnosis not present

## 2024-07-24 DIAGNOSIS — M5459 Other low back pain: Secondary | ICD-10-CM | POA: Diagnosis not present

## 2024-07-24 DIAGNOSIS — M9901 Segmental and somatic dysfunction of cervical region: Secondary | ICD-10-CM | POA: Diagnosis not present

## 2024-07-24 DIAGNOSIS — M9903 Segmental and somatic dysfunction of lumbar region: Secondary | ICD-10-CM | POA: Diagnosis not present

## 2024-07-24 DIAGNOSIS — M50222 Other cervical disc displacement at C5-C6 level: Secondary | ICD-10-CM | POA: Diagnosis not present

## 2024-07-24 DIAGNOSIS — M542 Cervicalgia: Secondary | ICD-10-CM | POA: Diagnosis not present

## 2024-07-24 DIAGNOSIS — M791 Myalgia, unspecified site: Secondary | ICD-10-CM | POA: Diagnosis not present

## 2024-07-26 ENCOUNTER — Ambulatory Visit: Payer: Self-pay | Admitting: Physician Assistant

## 2024-07-27 DIAGNOSIS — D1801 Hemangioma of skin and subcutaneous tissue: Secondary | ICD-10-CM | POA: Diagnosis not present

## 2024-07-27 DIAGNOSIS — L821 Other seborrheic keratosis: Secondary | ICD-10-CM | POA: Diagnosis not present

## 2024-07-27 DIAGNOSIS — L812 Freckles: Secondary | ICD-10-CM | POA: Diagnosis not present

## 2024-07-27 DIAGNOSIS — L570 Actinic keratosis: Secondary | ICD-10-CM | POA: Diagnosis not present

## 2024-08-21 DIAGNOSIS — R293 Abnormal posture: Secondary | ICD-10-CM | POA: Diagnosis not present

## 2024-08-21 DIAGNOSIS — M9901 Segmental and somatic dysfunction of cervical region: Secondary | ICD-10-CM | POA: Diagnosis not present

## 2024-08-21 DIAGNOSIS — M9903 Segmental and somatic dysfunction of lumbar region: Secondary | ICD-10-CM | POA: Diagnosis not present

## 2024-08-21 DIAGNOSIS — M5459 Other low back pain: Secondary | ICD-10-CM | POA: Diagnosis not present

## 2024-08-21 DIAGNOSIS — M791 Myalgia, unspecified site: Secondary | ICD-10-CM | POA: Diagnosis not present

## 2024-08-21 DIAGNOSIS — M9902 Segmental and somatic dysfunction of thoracic region: Secondary | ICD-10-CM | POA: Diagnosis not present

## 2024-08-21 DIAGNOSIS — M542 Cervicalgia: Secondary | ICD-10-CM | POA: Diagnosis not present

## 2024-08-21 DIAGNOSIS — M50222 Other cervical disc displacement at C5-C6 level: Secondary | ICD-10-CM | POA: Diagnosis not present

## 2024-08-21 DIAGNOSIS — M5137 Other intervertebral disc degeneration, lumbosacral region with discogenic back pain only: Secondary | ICD-10-CM | POA: Diagnosis not present

## 2024-08-24 NOTE — Progress Notes (Unsigned)
 Cardiology Office Note   Date:  08/25/2024  ID:  Ry, Moody 1945-10-15, MRN [id number] PCP: Loreli Elsie JONETTA Mickey., MD  [facility] Providers Cardiologist:  Stanly DELENA Leavens, MD Electrophysiologist:  OLE ONEIDA HOLTS, MD   History of Present Illness Weston Kallman is a 78 y.o. male with a past medical history of HTN, atrial fibrillation, VTE, and ICD implanted.   He presents today with a history of hypertrophic cardiomyopathy with worsening shortness of breath and fluid retention.  He experiences worsening shortness of breath and fluid retention after recent pacemaker setting adjustments. Despite taking Lasix  20 mg daily, he has significant ankle swelling and shortness of breath with minimal exertion. Previously, he could walk long distances without difficulty, but now he becomes short of breath after a short distance. Muscle aches accompany his shortness of breath.  He has hypertrophic cardiomyopathy with a thickened septum and a history of ventricular tachycardia, leading to ICD placement. He recalls an enlarged portion of his heart and a murmur since childhood. His last echocardiogram was approximately five years ago.  No recent changes in diet, exercise, or other medications.  Dr. Almetta came in the room and interrogated his ICD.  He has a [employer] dual-chamber ICD that was pacing 100% of the time.  He made some adjustments so that the patient is now operating on his intrinsic rhythm, 50 bpm.  We hope to improve his NYHA class II heart failure symptoms.  Reports no chest pain, pressure, or tightness. No orthopnea, PND. Reports no palpitations.   Discussed the use of AI scribe software for clinical note transcription with the patient, who gave verbal consent to proceed.  Today, he presents with a hx of hypertrophic cardiomyopathy with muscle weakness and fatigue during physical activity.  Over the past few months he has developed new  muscle weakness and fatigue with exertion. He now fatigues after about 1,300 steps and when walking hills, and feels markedly tired if he walks 6,000 to 8,000 steps in a day.  He denies chest pain, edema, or significant dyspnea, though he may feel mildly short of breath if he walks very fast.  He has hypertrophic cardiomyopathy that had been stable without functional limitation until these recent symptoms.  He has a pacemaker with prior setting issues that are now resolved. His heart rate is typically 45 to 55 bpm. Current medications include metoprolol , amiodarone , Eliquis , Lasix  as needed, and Cardizem  as needed. His chronically low heart rate has not usually been associated with fatigue.  He has had no recent episodes of atrial fibrillation or flutter, which previously occurred with activities such as walking or carrying groceries and caused marked exertional intolerance.  Reports no shortness of breath nor dyspnea on exertion. Reports no chest pain, pressure, or tightness. No edema, orthopnea, PND. Reports no palpitations.   Discussed the use of AI scribe software for clinical note transcription with the patient, who gave verbal consent to proceed.  ROS: Pertinent ROS in HPI  Studies Reviewed      Echo 08/23/19  1. Hypertrophic cardiomyopathy, sigmoid subtype. Basal septum measures 18 mm in maximal dimension. Flow acceleration noted at the LVOT, no LVOT obstruction noted. Chordal systolic anterior motion of the mitral valve without involvment of the anterior leaflet.   2. Left ventricular ejection fraction, by visual estimation, is 60 to  65%. The left ventricle has normal function. There is severely increased  left ventricular hypertrophy.   3. Elevated left atrial and left ventricular  end-diastolic pressures.   4. Left ventricular diastolic parameters are consistent with Grade III  diastolic dysfunction (restrictive).   5. Global right ventricle has normal systolic function.The right   ventricular size is normal. No increase in right ventricular wall  thickness.   6. Left atrial size was moderately dilated.   7. Right atrial size was moderately dilated.   8. The mitral valve is normal in structure. Mild mitral valve  regurgitation. No evidence of mitral stenosis.   9. The tricuspid valve is normal in structure. Tricuspid valve  regurgitation is mild.  10. The aortic valve is tricuspid. Aortic valve regurgitation is not  visualized. No evidence of aortic valve sclerosis or stenosis.  11. The pulmonic valve was normal in structure. Pulmonic valve  regurgitation is mild.  12. Moderately elevated pulmonary artery systolic pressure.  13. The inferior vena cava is dilated in size with <50% respiratory  variability, suggesting right atrial pressure of 15 mmHg.  14. The left ventricle has no regional wall motion abnormalities.       Physical Exam VS:  BP (!) 104/58   Pulse (!) 49   Ht 5' 10 (1.778 m)   Wt 173 lb (78.5 kg)   SpO2 99%   BMI 24.82 kg/m        Wt Readings from Last 3 Encounters:  08/25/24 173 lb (78.5 kg)  06/29/24 167 lb 12.8 oz (76.1 kg)  06/12/24 167 lb (75.8 kg)    GEN: Well nourished, well developed in no acute distress NECK: No JVD; No carotid bruits CARDIAC: RRR, no murmurs, rubs, gallops RESPIRATORY:  Clear to auscultation without rales, wheezing or rhonchi  ABDOMEN: Soft, non-tender, non-distended EXTREMITIES:  No edema; No deformity   ASSESSMENT AND PLAN   HCM (sigmoid type nonobstructive) Worsening dyspnea and fluid retention post pacemaker adjustment. Fluid retention possibly linked to pacemaker settings. Renal function compromised with creatinine 1.4-1.7, requiring cautious diuretic use. Recent muscle weakness and fatigue with increased activity. Cardiac MRI considered for further evaluation. Patient deferred for now -NYHA 1 symptoms - Consider cardiac MRI for detailed assessment of ventricular structure and tissue  characteristics.  Paroxysmal atrial fibrillation Previously symptomatic during exertion, now asymptomatic. Managed with metoprolol , amiodarone , and Eliquis .  Implantable cardioverter-defibrillator in place ICD settings adjusted to manage heart rate between 45-55 bpm. No issues reported, heart rate well-managed. -euvolemic on exam  Cardiac medication management Current medications include metoprolol , amiodarone , Eliquis , and Lasix  as needed. Medications effective and well-tolerated. - Continue current cardiac medications: metoprolol , amiodarone , Eliquis , and Lasix  as needed.      Dispo: He can follow-up in 12 months with Dr. Santo and 6 months with Dr. Almetta  Signed, Orren LOISE Fabry, PA-C

## 2024-08-25 ENCOUNTER — Ambulatory Visit: Attending: Physician Assistant | Admitting: Physician Assistant

## 2024-08-25 ENCOUNTER — Encounter: Payer: Self-pay | Admitting: Physician Assistant

## 2024-08-25 VITALS — BP 104/58 | HR 49 | Ht 70.0 in | Wt 173.0 lb

## 2024-08-25 DIAGNOSIS — I484 Atypical atrial flutter: Secondary | ICD-10-CM

## 2024-08-25 DIAGNOSIS — Z79899 Other long term (current) drug therapy: Secondary | ICD-10-CM | POA: Diagnosis not present

## 2024-08-25 DIAGNOSIS — D6869 Other thrombophilia: Secondary | ICD-10-CM | POA: Diagnosis not present

## 2024-08-25 DIAGNOSIS — Z95810 Presence of automatic (implantable) cardiac defibrillator: Secondary | ICD-10-CM

## 2024-08-25 DIAGNOSIS — I422 Other hypertrophic cardiomyopathy: Secondary | ICD-10-CM | POA: Diagnosis not present

## 2024-08-25 DIAGNOSIS — I480 Paroxysmal atrial fibrillation: Secondary | ICD-10-CM | POA: Diagnosis present

## 2024-08-25 DIAGNOSIS — I472 Ventricular tachycardia, unspecified: Secondary | ICD-10-CM | POA: Diagnosis not present

## 2024-08-25 NOTE — Patient Instructions (Signed)
 Medication Instructions:  Your physician recommends that you continue on your current medications as directed. Please refer to the Current Medication list given to you today. *If you need a refill on your cardiac medications before your next appointment, please call your pharmacy*  Lab Work: None ordered If you have labs (blood work) drawn today and your tests are completely normal, you will receive your results only by: MyChart Message (if you have MyChart) OR A paper copy in the mail If you have any lab test that is abnormal or we need to change your treatment, we will call you to review the results.  Testing/Procedures: None ordered  Follow-Up: At [facility], you and your health needs are our priority.  As part of our continuing mission to provide you with exceptional heart care, our providers are all part of one team.  This team includes your primary Cardiologist (physician) and Advanced Practice Providers or APPs (Physician Assistants and Nurse Practitioners) who all work together to provide you with the care you need, when you need it.  Your next appointment:   12 month(s)  Provider:   Stanly DELENA Leavens, MD   Follow up in 6 months with Donnice Primus, MD   We recommend signing up for the patient portal called MyChart.  Sign up information is provided on this After Visit Summary.  MyChart is used to connect with patients for Virtual Visits (Telemedicine).  Patients are able to view lab/test results, encounter notes, upcoming appointments, etc.  Non-urgent messages can be sent to your provider as well.   To learn more about what you can do with MyChart, go to [email or web address].   Other Instructions

## 2024-09-01 ENCOUNTER — Ambulatory Visit: Payer: Medicare Other

## 2024-09-01 DIAGNOSIS — I472 Ventricular tachycardia, unspecified: Secondary | ICD-10-CM | POA: Diagnosis not present

## 2024-09-02 LAB — CUP PACEART REMOTE DEVICE CHECK
Battery Remaining Longevity: 26 mo
Battery Remaining Percentage: 27 %
Battery Voltage: 2.87 V
Brady Statistic AP VP Percent: 59 %
Brady Statistic AP VS Percent: 2.9 %
Brady Statistic AS VP Percent: 7.9 %
Brady Statistic AS VS Percent: 30 %
Brady Statistic RA Percent Paced: 62 %
Brady Statistic RV Percent Paced: 67 %
Date Time Interrogation Session: 20251219020015
HighPow Impedance: 43 Ohm
HighPow Impedance: 44 Ohm
Implantable Lead Connection Status: 753985
Implantable Lead Connection Status: 753985
Implantable Lead Implant Date: 20141205
Implantable Lead Implant Date: 20141205
Implantable Lead Location: 753859
Implantable Lead Location: 753860
Implantable Lead Model: 7121
Implantable Pulse Generator Implant Date: 20171221
Lead Channel Impedance Value: 430 Ohm
Lead Channel Impedance Value: 460 Ohm
Lead Channel Pacing Threshold Amplitude: 0.75 V
Lead Channel Pacing Threshold Amplitude: 1 V
Lead Channel Pacing Threshold Pulse Width: 0.5 ms
Lead Channel Pacing Threshold Pulse Width: 0.5 ms
Lead Channel Sensing Intrinsic Amplitude: 12 mV
Lead Channel Sensing Intrinsic Amplitude: 3.3 mV
Lead Channel Setting Pacing Amplitude: 2 V
Lead Channel Setting Pacing Amplitude: 2.5 V
Lead Channel Setting Pacing Pulse Width: 0.5 ms
Lead Channel Setting Sensing Sensitivity: 0.5 mV
Pulse Gen Serial Number: 7387049
Zone Setting Status: 755011

## 2024-09-04 NOTE — Progress Notes (Signed)
 Remote ICD Transmission

## 2024-09-13 ENCOUNTER — Ambulatory Visit: Payer: Self-pay | Admitting: Cardiovascular Disease

## 2024-09-25 ENCOUNTER — Other Ambulatory Visit: Payer: Self-pay | Admitting: Internal Medicine

## 2024-09-25 ENCOUNTER — Ambulatory Visit: Attending: Internal Medicine

## 2024-09-25 VITALS — BP 119/66 | HR 47

## 2024-09-25 DIAGNOSIS — R2681 Unsteadiness on feet: Secondary | ICD-10-CM | POA: Diagnosis present

## 2024-09-25 DIAGNOSIS — R269 Unspecified abnormalities of gait and mobility: Secondary | ICD-10-CM | POA: Diagnosis present

## 2024-09-25 NOTE — Therapy (Unsigned)
 " OUTPATIENT PHYSICAL THERAPY NEURO EVALUATION   Patient Name: Joseph Morrow MRN: [id number] DOB:12-29-1945, 79 y.o., male Today's Date: 09/25/2024   PCP: Dr. Elsie Gentry REFERRING PROVIDER: Dr. Elsie Gentry  END OF SESSION:  PT End of Session - 09/25/24 1319     Visit Number 1    Number of Visits 9    Date for Recertification  11/20/24    PT Start Time 1315    PT Stop Time 1400    PT Time Calculation (min) 45 min    Equipment Utilized During Treatment Gait belt    Activity Tolerance Patient tolerated treatment well    Behavior During Therapy [facility] for tasks assessed/performed          Past Medical History:  Diagnosis Date   Acute cholecystitis 06/21/2011   AICD (automatic cardioverter/defibrillator) present    Allergy    SEASONAL   Cataract    Complication of anesthesia    HALLUCINATIONS   Dyspnea    ED (erectile dysfunction)    GERD (gastroesophageal reflux disease)    Hearing loss    Helicobacter pylori gastritis 06/2014   Hemorrhoids    Hiatal hernia    Hx of adenomatous colonic polyps 06/2014   Hypertrophic cardiomyopathy (HCC)    Mitral regurgitation    Moderate   Osteopenia    Paroxysmal atrial fibrillation (HCC) 04/2015   documented on ICD remote interrogation, treated with sotalol    Septal defect    LV Septal hyptertrophy 18mm, normal EF, minimal LVOT gradiant.   Tubular adenoma of colon    VT (ventricular tachycardia) (HCC) 08/18/2013   s/p St. Jude ICD   Past Surgical History:  Procedure Laterality Date   APPENDECTOMY  1950   ATRIAL FIBRILLATION ABLATION N/A 07/04/2020   Procedure: ATRIAL FIBRILLATION ABLATION;  Surgeon: Kelsie Agent, MD;  Location: [facility] INVASIVE CV LAB;  Service: Cardiovascular;  Laterality: N/A;   ATRIAL FIBRILLATION ABLATION N/A 09/18/2021   Procedure: ATRIAL FIBRILLATION ABLATION;  Surgeon: Cindie Ole DASEN, MD;  Location: [facility] INVASIVE CV LAB;  Service: Cardiovascular;  Laterality: N/A;   CARDIOVERSION N/A  02/25/2017   Procedure: CARDIOVERSION;  Surgeon: Okey Vina GAILS, MD;  Location: [facility] ENDOSCOPY;  Service: Cardiovascular;  Laterality: N/A;   CARDIOVERSION N/A 07/30/2021   Procedure: CARDIOVERSION;  Surgeon: Alvan Ronal BRAVO, MD;  Location: [facility] ENDOSCOPY;  Service: Cardiovascular;  Laterality: N/A;   CARDIOVERSION N/A 04/27/2022   Procedure: CARDIOVERSION;  Surgeon: Jeffrie Oneil BROCKS, MD;  Location: [facility] ENDOSCOPY;  Service: Cardiovascular;  Laterality: N/A;   CARDIOVERSION N/A 09/15/2022   Procedure: CARDIOVERSION;  Surgeon: Santo Stanly LABOR, MD;  Location: [facility] ENDOSCOPY;  Service: Cardiovascular;  Laterality: N/A;   CARDIOVERSION N/A 09/16/2023   Procedure: CARDIOVERSION;  Surgeon: Delford Maude BROCKS, MD;  Location: [facility] INVASIVE CV LAB;  Service: Cardiovascular;  Laterality: N/A;   CARDIOVERSION N/A 04/06/2024   Procedure: CARDIOVERSION;  Surgeon: Santo Stanly LABOR, MD;  Location: [facility] INVASIVE CV LAB;  Service: Cardiovascular;  Laterality: N/A;   CATARACT EXTRACTION, BILATERAL     CHOLECYSTECTOMY  06/24/2011   COLONOSCOPY     EP IMPLANTABLE DEVICE N/A 09/03/2016   SJM Fortify [address] Dr generator change by Dr Kelsie.  ICD implanted for secondary prevention of sudden death   IMPLANTABLE CARDIOVERTER DEFIBRILLATOR IMPLANT  08/18/2013   St. Jude Fortify [address] DR ICD implanted by Dr Kelsie for secondary treatment of VT   IMPLANTABLE CARDIOVERTER DEFIBRILLATOR IMPLANT N/A 08/18/2013   Procedure: IMPLANTABLE CARDIOVERTER DEFIBRILLATOR IMPLANT;  Surgeon: Agent JONETTA Kelsie,  MD;  Location: [facility] CATH LAB;  Service: Cardiovascular;  Laterality: N/A;   INGUINAL HERNIA REPAIR Bilateral 05/11/2024   Procedure: REPAIR, HERNIA, INGUINAL, BILATERAL, LAPAROSCOPIC;  Surgeon: Rubin Calamity, MD;  Location: [facility] OR;  Service: General;  Laterality: Bilateral;  LAPAROSCOPIC BILATERAL INGUINAL HERNIA REPAIR WITH MESH   TEE WITHOUT CARDIOVERSION N/A 07/30/2021   Procedure: TRANSESOPHAGEAL ECHOCARDIOGRAM (TEE);  Surgeon: Alvan Ronal BRAVO, MD;  Location: [facility] ENDOSCOPY;  Service: Cardiovascular;  Laterality: N/A;   TRANSESOPHAGEAL ECHOCARDIOGRAM (CATH LAB) N/A 09/16/2023   Procedure: TRANSESOPHAGEAL ECHOCARDIOGRAM;  Surgeon: Delford Maude BROCKS, MD;  Location: [facility] INVASIVE CV LAB;  Service: Cardiovascular;  Laterality: N/A;   Patient Active Problem List   Diagnosis Date Noted   Encounter for monitoring amiodarone  therapy 06/11/2023   Persistent atrial fibrillation (HCC) 04/08/2023   Transaminitis 08/14/2022   Ataxia 08/14/2022   RSV (respiratory syncytial virus pneumonia) 08/13/2022   Gait disturbance 08/13/2022   Hypercoagulable state due to persistent atrial fibrillation (HCC) 07/08/2022   Atypical atrial flutter (HCC)    Typical atrial flutter (HCC) 02/23/2017   Rupture of distal biceps tendon, left, initial encounter 12/23/2016   Multiple lung nodules on CT 03/20/2016   Cough 03/10/2016   Nausea 11/22/2015   PAF (paroxysmal atrial fibrillation) (HCC) 08/20/2015   History of colonic polyps 05/14/2014   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/04/2013   VT (ventricular tachycardia) (HCC) 09/04/2013   V tach (HCC) 08/18/2013   Hypertrophic cardiomyopathy (HCC)    Heart murmur    Mitral regurgitation    GERD (gastroesophageal reflux disease)     ONSET DATE: 09/21/2024  REFERRING DIAG: R26.81 (ICD-10-CM) - Unsteadiness on feet  THERAPY DIAG:  Unsteadiness on feet  Abnormality of gait and mobility  Rationale for Evaluation and Treatment: Rehabilitation  SUBJECTIVE:                                                                                                                                                                                             SUBJECTIVE STATEMENT: past medical history of HTN, atrial fibrillation, VTE, and ICD implanted. Pt reports his BP is normal when he goes to MD's office.  Pt reports that he was having SOB before but they adjusted his pacemaker and that took care of  it. Pt reports of 2 falls in last 6 months. Both times, he got up and turned and fell. Pt reports he feels unsteady at times where he feels that he has to hold on. Pt denies feeling dizzy. Pt experience light headedness. Patient has intermittent mild achy feeling in bil thighs which only occurs at night. Pt unsure if this  is related to intermittent balance issues.  Pt accompanied by: self  PERTINENT HISTORY: past medical history of HTN, atrial fibrillation, VTE, and ICD implanted.  PAIN:  Are you having pain? Yes: NPRS scale: 3/10 Pain location: bil thights R>L, only happens in the middle of the night and wakes patient up,  Pain description: pain is achy Aggravating factors: sleeping Relieving factors: OTC Tylenol   PRECAUTIONS: Fall  RED FLAGS: None   WEIGHT BEARING RESTRICTIONS: No  FALLS: Has patient fallen in last 6 months? Yes. Number of falls 2  LIVING ENVIRONMENT: Lives with: lives with their spouse Lives in: House/apartment Stairs: Yes: External: 3 steps; none Has following equipment at home: None  PLOF: Independent  PATIENT GOALS: improve balance  OBJECTIVE:  Note: Objective measures were completed at Evaluation unless otherwise noted.   LOWER EXTREMITY ROM:     Active  Right Eval Left Eval  Hip flexion    Hip extension    Hip abduction    Hip adduction    Hip internal rotation    Hip external rotation    Knee flexion    Knee extension    Ankle dorsiflexion    Ankle plantarflexion    Ankle inversion    Ankle eversion     (Blank rows = not tested)  LOWER EXTREMITY MMT:    MMT Right Eval Left Eval  Hip flexion    Hip extension    Hip abduction    Hip adduction    Hip internal rotation    Hip external rotation    Knee flexion    Knee extension    Ankle dorsiflexion    Ankle plantarflexion    Ankle inversion    Ankle eversion    (Blank rows = not tested)  GAIT: Findings: Gait Characteristics: {gait characteristics:25376}, Distance walked:  ***, Assistive device utilized:{Assistive devices:23999}, Level of assistance: {Levels of assistance:24026}, and Comments: ***  Functional tests: - 5x sit to stand: 11 sec without UE support - FUNCTIONAL GAIT ASSESSMENT  Date: *** Score  GAIT LEVEL SURFACE Instructions: Walk at your normal speed from here to the next mark (6 m) [20 ft]. (3) Normal - Walks 6 m (20 ft) in less than 5.5 seconds, no assistive devices, good speed, no evidence for imbalance, normal gait pattern, deviates no more than 15.24 cm (6 in) outside of the 30.48-cm (12-in) walkway width.  2.   CHANGE IN GAIT SPEED Instructions: Begin walking at your normal pace (for 1.5 m [5 ft]). When I tell you go, walk as fast as you can (for 1.5 m [5 ft]). When I tell you slow, walk as slowly as you can (for 1.5 m [5 ft]. (3) Normal - Able to smoothly change walking speed without loss of balance or gait deviation. Shows a significant difference in walking speeds between normal, fast, and slow speeds. Deviates no more than 15.24 cm (6 in) outside of the 30.48-cm (12-in) walkway width.  3.    GAIT WITH HORIZONTAL HEAD TURNS Instructions: Walk from here to the next mark 6 m (20 ft) away. Begin walking at your normal pace. Keep walking straight; after 3 steps, turn your head to the right and keep walking straight while looking to the right. After 3 more steps, turn your head to the left and keep walking straight while looking left. Continue alternating looking right and left. (2) Mild impairment - Performs head turns smoothly with slight change in gait velocity (eg, minor disruption to smooth gait path), deviates 15.24 -25.4  cm (6 -10 in) outside 30.48-cm (12-in) walkway width, or uses an assistive device.  4.   GAIT WITH VERTICAL HEAD TURNS Instructions: Walk from here to the next mark (6 m [20 ft]). Begin walking at your normal pace. Keep walking straight; after 3 steps, tip your head up and keep walking straight while looking up. After 3 more  steps, tip your head down, keep walking straight while looking down. Continue  alternating looking up and down every 3 steps until you have completed 2 repetitions in each direction. (2) Mild impairment - Performs task with slight change in gait velocity (eg, minor disruption to smooth gait path), deviates 15.24 -25.4 cm (6 -10 in) outside 30.48-cm (12-in) walkway width or uses assistive device.  5.  GAIT AND PIVOT TURN Instructions: Begin with walking at your normal pace. When I tell you, turn and stop, turn as quickly as you can to face the opposite direction and stop. (3) Normal - Pivot turns safely within 3 seconds and stops quickly with no loss of balance  6.   STEP OVER OBSTACLE Instructions: Begin walking at your normal speed. When you come to the shoe box, step over it, not around it, and keep walking. (3) Normal - Is able to step over 2 stacked shoe boxes taped together (22.86 cm [9 in] total height) without changing gait speed; no evidence of imbalance.  7.   GAIT WITH NARROW BASE OF SUPPORT Instructions: Walk on the floor with arms folded across the chest, feet aligned heel to toe in tandem for a distance of 3.6 m [12 ft]. The number of steps taken in a straight line are counted for a maximum of 10 steps. (0) Severe impairment - Ambulates less than 4 steps heel to toe or cannot perform without assistance.  8.   GAIT WITH EYES CLOSED Instructions: Walk at your normal speed from here to the next mark (6 m [20 ft]) with your eyes closed. (2) Mild impairment - Walks 6 m (20 ft), uses assistive device, slower speed, mild gait deviations, deviates 15.24 -25.4 cm (6 -10 in) outside 30.48-cm (12-in) walkway width. Ambulates 6 m (20 ft) in less than 9 seconds but greater than 7 seconds  9.   AMBULATING BACKWARDS Instructions: Walk backwards until I tell you to stop (3) Normal - Walks 6 m (20 ft), no assistive devices, good speed, no evidence for imbalance, normal gait pattern, deviates no more than  15.24 cm (6 in) outside 30.48-cm (12-in) walkway width.  10. STEPS Instructions: Walk up these stairs as you would at home (ie, using the rail if necessary). At the top turn around and walk down. (2) Mild impairment-Alternating feet, must use rail.  Total 23/30   Interpretation of scores: Non-Specific Older Adults Cutoff Score: <=22/30 = risk of falls Parkinsons Disease Cutoff score <15/30= fall risk (Hoehn & Yahr 1-4)  Minimally Clinically Important Difference (MCID)  Stroke (acute, subacute, and chronic) = MDC: 4.2 points Vestibular (acute) = MDC: 6 points Community Dwelling Older Adults =  MCID: 4 points Parkinsons Disease  =  MDC: 4.3 points  (Academy of Neu[employer]ic Physical Therapy (nd). Functional Gait Assessment. Retrieved from https://www.neuropt.org/docs/default-source/cpgs/core-outcome-measures/function-gait-assessment-pocket-guide-proof9-(2).pdf?sfvrsn=b33f35043_0.)  TREATMENT DATE:  09/25/24:     PATIENT EDUCATION: Education details: see above Person educated: Patient Education method: Medical Illustrator Education comprehension: verbalized understanding  HOME EXERCISE PROGRAM: HEP  GOALS: Goals reviewed with patient? Yes  SHORT TERM GOALS: Target date: 10/23/2024   Patient will be at least 50% compliant with HEP to self manage symptoms at home.  Baseline: Goal status: INITIAL   LONG TERM GOALS: Target date: 11/20/2024   *** Baseline:  Goal status: INITIAL  2.  *** Baseline:  Goal status: INITIAL  3.  *** Baseline:  Goal status: INITIAL  4.  *** Baseline:  Goal status: INITIAL  5.  *** Baseline:  Goal status: INITIAL  6.  *** Baseline:  Goal status: INITIAL  ASSESSMENT:  CLINICAL IMPRESSION: Patient is a *** y.o. *** who was seen today for physical therapy evaluation and treatment for ***.   OBJECTIVE  IMPAIRMENTS: {opptimpairments:25111}.   ACTIVITY LIMITATIONS: {activitylimitations:27494}  PARTICIPATION LIMITATIONS: {participationrestrictions:25113}  PERSONAL FACTORS: {Personal factors:25162} are also affecting patient's functional outcome.   REHAB POTENTIAL: {rehabpotential:25112}  CLINICAL DECISION MAKING: {clinical decision making:25114}  EVALUATION COMPLEXITY: {Evaluation complexity:25115}  PLAN:  PT FREQUENCY: {rehab frequency:25116}  PT DURATION: {rehab duration:25117}  PLANNED INTERVENTIONS: {rehab planned interventions:25118::97110-Therapeutic exercises,97530- Therapeutic [phone]- Neuromuscular re-education,97535- Self Rjmz,02859- Manual therapy,Patient/Family education}  PLAN FOR NEXT SESSION: PIERRETTE Raj LOISE Tobie, PT 09/25/2024, 1:19 PM        "

## 2024-10-03 ENCOUNTER — Ambulatory Visit

## 2024-10-13 ENCOUNTER — Ambulatory Visit: Admitting: Physical Therapy

## 2024-10-13 VITALS — BP 131/71 | HR 45

## 2024-10-13 DIAGNOSIS — R2681 Unsteadiness on feet: Secondary | ICD-10-CM | POA: Diagnosis not present

## 2024-10-13 DIAGNOSIS — R269 Unspecified abnormalities of gait and mobility: Secondary | ICD-10-CM

## 2024-10-17 ENCOUNTER — Ambulatory Visit: Admitting: Physical Therapy

## 2024-10-17 VITALS — BP 116/66 | HR 50

## 2024-10-17 DIAGNOSIS — R269 Unspecified abnormalities of gait and mobility: Secondary | ICD-10-CM

## 2024-10-17 DIAGNOSIS — R2681 Unsteadiness on feet: Secondary | ICD-10-CM

## 2024-10-23 ENCOUNTER — Ambulatory Visit

## 2024-11-07 ENCOUNTER — Other Ambulatory Visit (HOSPITAL_COMMUNITY)
